# Patient Record
Sex: Male | Born: 1950
Health system: Southern US, Community
[De-identification: ages and names within clinical notes are randomized; demographics above are authoritative.]

## PROBLEM LIST (undated history)

## (undated) DIAGNOSIS — Z806 Family history of leukemia: Secondary | ICD-10-CM

## (undated) DIAGNOSIS — R2681 Unsteadiness on feet: Secondary | ICD-10-CM

## (undated) DIAGNOSIS — C61 Malignant neoplasm of prostate: Secondary | ICD-10-CM

## (undated) HISTORY — DX: Unsteadiness on feet: R26.81

## (undated) HISTORY — DX: Malignant neoplasm of prostate: C61

## (undated) HISTORY — DX: Family history of leukemia: Z80.6

---

## 2011-12-13 ENCOUNTER — Ambulatory Visit: Payer: Self-pay | Admitting: Internal Medicine

## 2012-07-17 DIAGNOSIS — C61 Malignant neoplasm of prostate: Secondary | ICD-10-CM

## 2012-07-17 HISTORY — DX: Malignant neoplasm of prostate: C61

## 2012-07-31 ENCOUNTER — Ambulatory Visit: Payer: Self-pay | Admitting: Urology

## 2012-08-05 ENCOUNTER — Ambulatory Visit: Payer: Self-pay | Admitting: Urology

## 2012-09-09 ENCOUNTER — Ambulatory Visit: Payer: Self-pay | Admitting: Radiation Oncology

## 2012-09-14 ENCOUNTER — Ambulatory Visit: Payer: Self-pay | Admitting: Radiation Oncology

## 2012-10-15 ENCOUNTER — Ambulatory Visit: Payer: Self-pay | Admitting: Radiation Oncology

## 2012-11-07 LAB — CBC CANCER CENTER
Basophil #: 0 x10 3/mm (ref 0.0–0.1)
Basophil %: 0.6 %
HCT: 43.4 % (ref 40.0–52.0)
Lymphocyte %: 36.4 %
Monocyte %: 13.1 %
Neutrophil %: 45.4 %
Platelet: 164 x10 3/mm (ref 150–440)
RDW: 13.8 % (ref 11.5–14.5)
WBC: 3.3 x10 3/mm — ABNORMAL LOW (ref 3.8–10.6)

## 2012-11-14 ENCOUNTER — Ambulatory Visit: Payer: Self-pay | Admitting: Radiation Oncology

## 2012-11-14 LAB — CBC CANCER CENTER
Eosinophil %: 6 %
Lymphocyte #: 1 x10 3/mm (ref 1.0–3.6)
Lymphocyte %: 29.2 %
MCHC: 32.9 g/dL (ref 32.0–36.0)
Monocyte #: 0.6 x10 3/mm (ref 0.2–1.0)
Monocyte %: 17 %
Neutrophil %: 47.2 %
RBC: 4.7 10*6/uL (ref 4.40–5.90)

## 2012-11-21 LAB — CBC CANCER CENTER
Basophil #: 0 "x10 3/mm "
Basophil %: 0.9 %
Eosinophil #: 0.2 "x10 3/mm "
Eosinophil %: 6.8 %
HCT: 40.1 %
HGB: 13.7 g/dL
Lymphocyte %: 23.5 %
Lymphs Abs: 0.7 "x10 3/mm " — ABNORMAL LOW
MCH: 31 pg
MCHC: 34.1 g/dL
MCV: 91 fL
Monocyte #: 0.5 "x10 3/mm "
Monocyte %: 14.9 %
Neutrophil #: 1.7 "x10 3/mm "
Neutrophil %: 53.9 %
Platelet: 137 "x10 3/mm " — ABNORMAL LOW
RBC: 4.41 "x10 6/mm "
RDW: 14.2 %
WBC: 3.1 "x10 3/mm " — ABNORMAL LOW

## 2012-11-28 LAB — CBC CANCER CENTER
Basophil #: 0 x10 3/mm (ref 0.0–0.1)
HCT: 39.8 % — ABNORMAL LOW (ref 40.0–52.0)
HGB: 13.5 g/dL (ref 13.0–18.0)
MCH: 31 pg (ref 26.0–34.0)
MCHC: 33.9 g/dL (ref 32.0–36.0)
MCV: 91 fL (ref 80–100)
Monocyte #: 0.5 x10 3/mm (ref 0.2–1.0)
Neutrophil #: 1.7 x10 3/mm (ref 1.4–6.5)
Platelet: 149 x10 3/mm — ABNORMAL LOW (ref 150–440)
RBC: 4.35 10*6/uL — ABNORMAL LOW (ref 4.40–5.90)
RDW: 14.2 % (ref 11.5–14.5)
WBC: 3.2 x10 3/mm — ABNORMAL LOW (ref 3.8–10.6)

## 2012-12-15 ENCOUNTER — Ambulatory Visit: Payer: Self-pay | Admitting: Radiation Oncology

## 2013-02-03 ENCOUNTER — Ambulatory Visit: Payer: Self-pay | Admitting: Radiation Oncology

## 2013-02-14 ENCOUNTER — Ambulatory Visit: Payer: Self-pay | Admitting: Radiation Oncology

## 2013-12-10 ENCOUNTER — Ambulatory Visit: Payer: Self-pay | Admitting: Urology

## 2014-10-07 ENCOUNTER — Ambulatory Visit
Admit: 2014-10-07 | Disposition: A | Discharge status: Home / Self Care | Payer: Self-pay | Attending: Oncology | Admitting: Oncology

## 2014-10-07 LAB — CREATININE, SERUM: Creatine, Serum: 1.05

## 2014-10-09 LAB — COMPREHENSIVE METABOLIC PANEL
ANION GAP: 7 (ref 7–16)
Albumin: 4.1 g/dL
Alkaline Phosphatase: 99 U/L
BILIRUBIN TOTAL: 0.5 mg/dL
BUN: 21 mg/dL — AB
CHLORIDE: 103 mmol/L
CO2: 28 mmol/L
CREATININE: 1.05 mg/dL
Calcium, Total: 9.3 mg/dL
GLUCOSE: 91 mg/dL
Potassium: 3.8 mmol/L
SGOT(AST): 22 U/L
SGPT (ALT): 14 U/L — ABNORMAL LOW
SODIUM: 138 mmol/L
Total Protein: 7.3 g/dL

## 2014-10-09 LAB — CBC CANCER CENTER
BASOS ABS: 0 x10 3/mm (ref 0.0–0.1)
Basophil %: 0.9 %
Eosinophil #: 0.2 x10 3/mm (ref 0.0–0.7)
Eosinophil %: 5.2 %
HCT: 35.3 % — ABNORMAL LOW (ref 40.0–52.0)
HGB: 12.4 g/dL — ABNORMAL LOW (ref 13.0–18.0)
LYMPHS ABS: 1.1 x10 3/mm (ref 1.0–3.6)
LYMPHS PCT: 25.3 %
MCH: 32.1 pg (ref 26.0–34.0)
MCHC: 35.1 g/dL (ref 32.0–36.0)
MCV: 91 fL (ref 80–100)
Monocyte #: 0.5 x10 3/mm (ref 0.2–1.0)
Monocyte %: 11.6 %
Neutrophil #: 2.6 x10 3/mm (ref 1.4–6.5)
Neutrophil %: 57 %
PLATELETS: 243 x10 3/mm (ref 150–440)
RBC: 3.86 10*6/uL — ABNORMAL LOW (ref 4.40–5.90)
RDW: 12.6 % (ref 11.5–14.5)
WBC: 4.5 x10 3/mm (ref 3.8–10.6)

## 2014-10-09 LAB — MAGNESIUM: Magnesium: 2.1 mg/dL

## 2014-10-16 ENCOUNTER — Ambulatory Visit
Admit: 2014-10-16 | Disposition: A | Discharge status: Home / Self Care | Payer: Self-pay | Attending: Oncology | Admitting: Oncology

## 2014-11-06 NOTE — Consult Note (Signed)
Reason for Visit: This 64 year old Male patient presents to the clinic for initial evaluation of  prostate cancer .   Referred by Dr. Maryan Puls.  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 Vira Agar was noted to have a nodular prostate. PSA was obtained and was in the 18 range. He was seen by Dr. Maryan Puls underwent transrectal ultrasound-guided biopsies showing prostatic adenocarcinoma with high score Gleason 7 (4+3). Tumor encompass most of the biopsy specimens. Patient does have some lower tract symptoms including urgency frequency and nocturia x3.He is having no hematuria. He is in no prior history of abdominalor pelvic surgery. I been asked to evaluate the patient and give opinion toward radiation therapy. Based on his Gleason score, abdominal pattern of Gleason 4 he has high risk of extracapsular extension and possible pelvic lymph node involvement.his CT scan of the abdomen pelvis shows prominent seminal vesicles. No clear indication of extracapsular extension although there are small periprosthetic lymph nodes in the pelvis.  Past, Family and Social History:  Past Medical History positive   Past Surgical History tonsillectomy   Family History noncontributory   Social History positive   Social History Comments no smoking history, social EtOH use history   Additional Past Medical and Surgical History seen by himself today   Allergies:   No Known Allergies:   Home Meds:  Home Medications: Medication Instructions Status  Rapaflo 4 mg oral capsule 1 cap(s) orally once a day Active   Review of Systems:  General negative   Performance Status (ECOG)  0   Skin negative   Breast negative   Ophthalmologic negative   ENMT negative   Respiratory and Thorax negative   Cardiovascular negative   Gastrointestinal negative   Musculoskeletal negative   Neurological negative   Psychiatric negative   Hematology/Lymphatics negative   Endocrine negative   Allergic/Immunologic negative   Review of Systems   according to nurse's notesPatient denies any weight loss, fatigue, weakness, fever, chills or night sweats. Patient denies any loss of vision, blurred vision. Patient denies any ringing  of the ears or hearing loss. No irregular heartbeat. Patient denies heart murmur or history of fainting. Patient denies any chest pain or pain radiating to her upper extremities. Patient denies any shortness of breath, difficulty breathing at night, cough or hemoptysis. Patient denies any swelling in the lower legs. Patient denies any nausea vomiting, vomiting of blood, or coffee ground material in the vomitus. Patient denies any stomach pain. Patient states has had normal bowel movements no significant constipation or diarrhea. Patient denies any dysuria, hematuria or significant nocturia. Patient denies any problems walking, swelling in the joints or loss of balance. Patient denies any skin changes, loss of hair or loss of weight. Patient denies any excessive worrying or anxiety or significant depression. Patient denies any problems with insomnia. Patient denies excessive thirst, polyuria, polydipsia. Patient denies any swollen glands, patient denies easy bruising or easy bleeding. Patient denies any recent infections, allergies or URI. Patient "s visual fields have not changed significantly in recent time.  Nursing Notes:  Nursing Vital Signs and Chemo Nursing Nursing Notes: *CC Vital Signs Flowsheet:  24-Feb-14 08:47  Temp Temperature 96.2  Pulse Pulse 64  Respirations Respirations 20  SBP SBP 117  DBP DBP 75  Pain Scale (0-10)  0  Current Weight  (kg) (kg) 77.1  Height (cm) centimeters 178.6  BSA (m2) 1.9   Physical Exam:  General/Skin/HEENT:  General normal   Skin normal   Eyes normal   ENMT normal   Head and Neck normal   Additional PE well-developed well-nourished male in NAD. Lungs are clear to A&P cardiac examination shows regular rate and rhythm. Abdomen is benign with no organomegaly or masses noted. On rectal exam rectal sphincter tone is good. Prostate is firm throughout slightly asymmetric with left lateral lobe little more firm than the right. No other rectal abnormalities identified.   Breasts/Resp/CV/GI/GU:  Respiratory and Thorax normal   Cardiovascular normal   Gastrointestinal normal   Genitourinary normal   MS/Neuro/Psych/Lymph:  Musculoskeletal normal   Neurological normal   Lymphatics normal   Other Results:  Radiology Results: LabUnknown:    15-Jan-14 12:01, CT Abdomen and Pelvis W/WO Contrast  PACS Image     20-Jan-14 13:12, Bone Scan Whole Body (Part 2 of 2)  PACS Image   CT:    15-Jan-14 12:01, CT Abdomen and Pelvis W/WO Contrast  CT Abdomen and Pelvis W/WO Contrast   REASON FOR EXAM:    LABS 1st Prostate CA  COMMENTS:       PROCEDURE: KCT - KCT ABDOMEN/PELVIS W/WO  - Jul 31 2012 12:01PM     RESULT: History: Prostate cancer    Comparison: No comparison    Technique: Multiple axial images of the abdomen and pelvis were performed   from the lung bases to the pubic symphysis, without p.o. contrast and   prior to and following 100 ml of Isovue-370 intravenous contrast in the   nephrographic and excretory renal phases.     Findings:  The lung bases are clear. Thereis no pneumothorax. The heart size is   normal.     There are no renal, ureteral, or bladder calculi. There is no obstructive   uropathy. There is no cystic or solid renal mass. Bilateral kidneys   enhance normally and symmetrically. There is normal symmetric bilateral   excretion of contrast without evidence of  filling defects. The bladder is   unremarkable. The prostate gland measures 4.8 x 4.1 cm.    The liver demonstrates no focal abnormality. There is no intrahepatic or   extrahepatic biliary ductal dilatation. The gallbladder is unremarkable.   The spleen demonstrates no focal abnormality. The  adrenal glands, and   pancreas are normal.    The unopacified stomach, duodenum, small intestine, and large intestine     demonstrate no gross abnormality, but evaluation is limited secondary to   lack of enteric contrast. There is diverticulosis without evidence of   diverticulitis. There is no pneumoperitoneum, pneumatosis, or portal   venous gas. There is no abdominal or pelvic free fluid.There is no   lymphadenopathy.     The abdominal aorta is normal in caliber.    The osseous structures are unremarkable.    IMPRESSION:     1. No acute abdominal or pelvic pathology.    2. Prostatic enlargement.  Dictation Site: 1        Verified IH:KVQQV Marlowe Sax, M.D., MD  Nuclear Med:    20-Jan-14 13:12, Bone Scan Whole Body (Part 2 of 2)  Bone Scan Whole Body (Part 2 of 2)   REASON FOR EXAM:  Prostate CA  COMMENTS:       PROCEDURE: KNM - KNM BONE WB 3HR 2 OF 2  - Aug 05 2012  1:12PM     RESULT: The patient received a dose of 23.62 mCi of technetium 27 M MDP.   Anterior and posterior whole body images Augmentin with oblique views of   the pelvis and thoracic region. There is a small amount of urine in the   bladder. There is some increased localization in the right maxillary   periodontal region and in the left nasal region. These could be secondary   to inflammation. Correlate clinically. Otherwise study is grossly   unremarkable. Some slightly asymmetric increased localization in the   right acromioclavicular joint with some mild increased uptake in the   sternomanubrial joint.  IMPRESSION:  No findings to suggest definite bony metastatic disease.   Likely inflammatory change in the  left nasal region and right maxillary.   Doppler region. Nonspecific, possibly degenerative changes in the sternum   and right sternoclavicular joint.    Dictation Site: 2        Verified By: Sundra Aland, M.D., MD   Relevent Results:   Relevant Scans and Labs CT scan and bone scan are both reviewed   Assessment and Plan: Impression:   clinical stage IIB adenocarcinoma the prostate in 64 year old male Plan:   at this time I got over treatment options with the patient including surgical intervention versus radiation therapy treatment options. I believe he is at high risk for extracapsular extension and possible pelvic lymph node involvement. I have recommended IMRT radiation therapy to his prostate as well as his pelvic lymph nodes. Would plan on delivering8200 cGy to his prostate and 5400 cGy to his pelvic nodes using IMRT dose painting technique. I've also would like him to be suppressed on Lupron for at least a year and a half and have given him and ordered a Lupron injection for 4 months today. Risks and benefits of Lupron therapy along with IMRT radiation therapy was discussed in detail. Exacerbation of urinary symptoms, possible diarrhea, possible fatigue, all were explained in detail to the patient. I have set him up for CT simulation in about a month's time like Lupron to work for about a month prior to commencing with radiation treatments. I've also asked in the meantime Dr. Yves Dill to place gold fiducial markers in the prostate for daily image guided radiation therapy. Appointments were made. Patient seems to comprehend my treatment plan well.  I would like to take this opportunity to thank you for allowing me to continue to participate in this patient's care.  CC Referral:  cc: Dr. Maryan Puls, Dr. Verta Ellen, Dr. Candice Camp   Electronic Signatures: Baruch Gouty Roda Shutters (MD)  (Signed 24-Feb-14 12:34)  Authored: HPI, Diagnosis, PFSH, Allergies, Home Meds, ROS, Nursing  Notes, Physical Exam, Other Results, Relevent Results, Encounter Assessment and Plan, CC Referring Physician   Last Updated: 24-Feb-14 12:34 by Armstead Peaks (MD)

## 2014-11-12 LAB — LACTATE DEHYDROGENASE: LDH: 205 U/L — ABNORMAL HIGH

## 2014-11-12 LAB — COMPREHENSIVE METABOLIC PANEL
ALT: 16 U/L — AB
Albumin: 4.4 g/dL
Alkaline Phosphatase: 103 U/L
Anion Gap: 8 (ref 7–16)
BUN: 24 mg/dL — AB
Bilirubin,Total: 0.4 mg/dL
CREATININE: 1.02 mg/dL
Calcium, Total: 9.2 mg/dL
Chloride: 105 mmol/L
Co2: 24 mmol/L
EGFR (Non-African Amer.): >60
GLUCOSE: 98 mg/dL
Potassium: 4.1 mmol/L
SGOT(AST): 23 U/L
Sodium: 137 mmol/L
TOTAL PROTEIN: 7.4 g/dL

## 2014-11-12 LAB — CBC CANCER CENTER
Basophil #: 0.1 x10 3/mm (ref 0.0–0.1)
Basophil %: 1.1 %
EOS ABS: 0.3 x10 3/mm (ref 0.0–0.7)
Eosinophil %: 5 %
HCT: 37.7 % — ABNORMAL LOW (ref 40.0–52.0)
HGB: 13 g/dL (ref 13.0–18.0)
LYMPHS PCT: 22.6 %
Lymphocyte #: 1.3 x10 3/mm (ref 1.0–3.6)
MCH: 31.4 pg (ref 26.0–34.0)
MCHC: 34.6 g/dL (ref 32.0–36.0)
MCV: 91 fL (ref 80–100)
MONO ABS: 0.5 x10 3/mm (ref 0.2–1.0)
MONOS PCT: 9 %
NEUTROS PCT: 62.3 %
Neutrophil #: 3.5 x10 3/mm (ref 1.4–6.5)
PLATELETS: 224 x10 3/mm (ref 150–440)
RBC: 4.16 10*6/uL — ABNORMAL LOW (ref 4.40–5.90)
RDW: 13.5 % (ref 11.5–14.5)
WBC: 5.6 x10 3/mm (ref 3.8–10.6)

## 2014-11-13 LAB — PSA: PSA: 59.6 ng/mL — ABNORMAL HIGH (ref 0.0–4.0)

## 2014-11-18 ENCOUNTER — Other Ambulatory Visit: Payer: Self-pay | Admitting: Oncology

## 2014-11-18 ENCOUNTER — Encounter: Payer: Self-pay | Admitting: *Deleted

## 2014-11-18 ENCOUNTER — Encounter: Payer: Self-pay | Admitting: Oncology

## 2014-11-18 DIAGNOSIS — C61 Malignant neoplasm of prostate: Secondary | ICD-10-CM

## 2014-11-19 ENCOUNTER — Ambulatory Visit: Payer: Self-pay

## 2014-11-19 ENCOUNTER — Encounter: Payer: Self-pay | Admitting: *Deleted

## 2014-11-19 ENCOUNTER — Telehealth: Payer: Self-pay | Admitting: *Deleted

## 2014-11-19 ENCOUNTER — Ambulatory Visit: Payer: Self-pay | Admitting: Oncology

## 2014-11-19 NOTE — Telephone Encounter (Addendum)
S/W Baxter Flattery at [facility] regarding patient assistance application. She reports that she is working on this, but had received incorrect insurance information for the patient. Insurance information updated and she will call back as soon as she has an application update.  11/23/14 - T/C made back to [employer] regarding patient eligibility for abiraterone drug assistance program. Was informed that his insurance would need to be verified, and this process usually takes 2-3 days. States they will notify us as soon as they can determine whether the patient is eligible.  11/24/14 $[id number]$ :00am  T/C back to [employer] to inquire about abiraterone. Was informed by Jonelle Sidle at [employer] the request for insurance verification was sent as an expedited request, but they have no way of knowing when the insurance will be verified. States she will request that it be moved to the top of the list; but also states the patient will not be approved until he has met his $2000.00 insurance drug benefit cap. T/C then made to [facility], who verified that the patient had only used $200.00 of his benefit. She ran the claim and determined that the patient would have an out-of-pocket balance of $4420.00. Informed her that the patient cannot afford this, and she states he is eligible for a one time co-pay card, which would bring his co-pay balance to just $10.00. Informed her that this is acceptable, and states she will process the medication prescription now and call the patient to schedule a delivery time for tomorrow. T/C made to patient to verify that this is acceptable, and he agrees that it is. Informed Dr. Oliva Bustard of all of above, and he states it is OK to postpone the patient's Zometa until tomorrow so he will not have to make an extra trip to begin his study drug and will only have labs drawn once. Instructed Mr. Wiederholt that we will cancel his appt originally scheduled for today, and reschedule his appointment for the same  time tomorrow to begin his new chemotherapy regimen and receive his Zometa infusion. Patient verbalizes agreement with this plan.

## 2014-11-19 NOTE — Progress Notes (Signed)
William Wright was randomized this afternoon to Arm B of the [facility] [phone] research protocol and will receive oral Enzalutamide(Xtandi) + Abirateraone(Zytiga) + Prednisone. Dr. Oliva Bustard notified of randomization arm and the study drug Enzalutamide was ordered through [facility]. Application for patient medication assistance was sent to [employer] last week. T/C made to follow up on this application. Spoke with Baxter Flattery, who stated she had received incorrect insurance information for the patient. Insurance information corrected and she is to follow up and let me know whether the patient qualifies for this assistance. T/C made to the patient to inform of the treatment arm he is now on.

## 2014-11-23 ENCOUNTER — Other Ambulatory Visit: Payer: Self-pay | Admitting: *Deleted

## 2014-11-23 DIAGNOSIS — C61 Malignant neoplasm of prostate: Secondary | ICD-10-CM

## 2014-11-23 MED ORDER — PREDNISONE 5 MG PO TABS: 5.0000 mg | ORAL_TABLET | Freq: Two times a day (BID) | ORAL | Status: DC

## 2014-11-24 ENCOUNTER — Other Ambulatory Visit: Payer: Self-pay | Admitting: *Deleted

## 2014-11-24 ENCOUNTER — Other Ambulatory Visit: Payer: BLUE CROSS/BLUE SHIELD

## 2014-11-24 ENCOUNTER — Ambulatory Visit: Payer: BLUE CROSS/BLUE SHIELD

## 2014-11-24 DIAGNOSIS — C61 Malignant neoplasm of prostate: Secondary | ICD-10-CM

## 2014-11-25 ENCOUNTER — Inpatient Hospital Stay: Payer: BLUE CROSS/BLUE SHIELD

## 2014-11-25 ENCOUNTER — Encounter (INDEPENDENT_AMBULATORY_CARE_PROVIDER_SITE_OTHER): Payer: Self-pay

## 2014-11-25 ENCOUNTER — Inpatient Hospital Stay: Payer: BLUE CROSS/BLUE SHIELD | Attending: Oncology

## 2014-11-25 ENCOUNTER — Other Ambulatory Visit: Payer: Self-pay | Admitting: Oncology

## 2014-11-25 ENCOUNTER — Encounter: Payer: Self-pay | Admitting: Oncology

## 2014-11-25 VITALS — BP 128/76 | HR 62 | Temp 97.3°F | Resp 18

## 2014-11-25 DIAGNOSIS — C61 Malignant neoplasm of prostate: Secondary | ICD-10-CM | POA: Insufficient documentation

## 2014-11-25 DIAGNOSIS — Z79899 Other long term (current) drug therapy: Secondary | ICD-10-CM | POA: Diagnosis not present

## 2014-11-25 DIAGNOSIS — C7951 Secondary malignant neoplasm of bone: Secondary | ICD-10-CM | POA: Diagnosis not present

## 2014-11-25 LAB — BASIC METABOLIC PANEL
ANION GAP: 5 (ref 5–15)
BUN: 24 mg/dL — ABNORMAL HIGH (ref 6–20)
CO2: 28 mmol/L (ref 22–32)
Calcium: 9.1 mg/dL (ref 8.9–10.3)
Chloride: 105 mmol/L (ref 101–111)
Creatinine, Ser: 1.04 mg/dL (ref 0.61–1.24)
GFR calc Af Amer: >60 mL/min (ref >60)
GFR calc non Af Amer: >60 mL/min (ref >60)
Glucose, Bld: 100 mg/dL — ABNORMAL HIGH (ref 65–99)
Potassium: 4.1 mmol/L (ref 3.5–5.1)
SODIUM: 138 mmol/L (ref 135–145)

## 2014-11-25 MED ORDER — INV-ENZALUTAMIDE 40 MG CAPS #120 ALLIANCE A031201: 160.0000 mg | ORAL_CAPSULE | Freq: Every day | ORAL | Status: DC

## 2014-11-25 MED ORDER — ZOLEDRONIC ACID 4 MG/100ML IV SOLN
4.0000 mg | Freq: Once | INTRAVENOUS | Status: AC
  Administered 2014-11-25: 4 mg via INTRAVENOUS
  Filled 2014-11-25: qty 100

## 2014-11-25 MED ORDER — SODIUM CHLORIDE 0.9 % IV SOLN
Freq: Once | INTRAVENOUS | Status: AC
  Administered 2014-11-25: 12:00:00 via INTRAVENOUS
  Filled 2014-11-25: qty 250

## 2014-11-25 NOTE — Progress Notes (Unsigned)
11/25/14 @10 :55am  William Wright returns to clinic as instructed this morning to begin his new treatment as prescribed by [facility] [phone] [employer] study, which includes Enzaludamide 160mg (4 capsules) every am with or without food; Abiraterone 1000mg (4 tablets) every pm on an empty stomach; and Prednisone 5mg  twice daily preferably with food. Reviewed all medication instructions with patient including what time and how to take each medication, how many of each tablet/capsule to take, how to document when he has taken the meds on each individual log, what to do if he forgets to take a dose, and not to repeat any of the medication if he vomits after he takes a scheduled dose. William Wright questions when he should take the Abiraterone because he was instructed by the [facility] Pharmacist to take this medication in the morning. Instructed him that per the protocol schedule, he will take the Enzaludamide each morning and the Abiraterone in the evening. Reviewed med diary, which also shows the name of each medication and whether they should be taken in am or pm. Instructions for each medication provided to patient along with med diary/calendar for each drug. The patient has picked up his prednisone prescription from the pharmacy and [facility] delivered the Abiraterone to his home today. He did not bring these medications with him this morning, but states he has not taken any of the new medications yet. Inst to take the Enzalutamide and prednisone as soon as he gets home today, and then take Prednisone again at dinner time and the Abiraterone 2 hours after eating dinner this evening. William Wright voiced understanding of these instructions, but I will call him at home to make sure he takes the medication correctly.

## 2014-11-30 ENCOUNTER — Telehealth: Payer: Self-pay | Admitting: *Deleted

## 2014-11-30 NOTE — Progress Notes (Unsigned)
PSN met with patient today to discuss financial concerns.  Patient referred to a patient financial services representative to discuss the completion of a financial hardship document.

## 2014-11-30 NOTE — Telephone Encounter (Signed)
11/23/14 Spoke with patient earlier this afternoon to get a list of all of the dietary supplements the patient is taking. Mr. Murch listed multiple supplements and vitamins that were not on his original medication list including Beta-1 Glucon 3-D, Spirolina, Barley power Green Supreme, Selenium, Turmeric, Zinc, Green Tea extract Heart Plus supplement, Multi-nutrient daily supplement, Eleuthera root powder, and vitamins A, E, C & D. Patient states these supplements are all recommended for Cancer Prevention. Reviewed the list with Dr. Oliva Bustard, who states the patient should immediately stop taking all of the supplements and only take a multi-vitamin each day. States to inform Mr. Sasso that we have no research to determine whether any of these supplements are safe; therefore he should stop taking all of them while he is taking chemotherapy. T/C made back to Mr. Dulay at 4:47pm to instruct him regarding the above information from Dr. Oliva Bustard. Patient states he will stop taking all of the medications today and take only what Dr. Oliva Bustard prescribes for him. Reminded patient that Dr. Oliva Bustard has e-scribed a prescription for Prednisone that he will need to pick up to take when the oral chemotherapy begins, and Mr. Goza states he will pick it up today or tomorrow.

## 2014-12-02 ENCOUNTER — Telehealth: Payer: Self-pay | Admitting: *Deleted

## 2014-12-02 NOTE — Telephone Encounter (Signed)
12/02/14 @09 :13 T/C made to Polly [facility] to follow up his first week of oral chemotherapy. Patient reports he is doing well, and the only side effect he has noticed is that his hot flashes have increased. He denies that this has interfered with his ADLs or ability to sleep. States he did miss his Prednisone dose scheduled for last PM, and that he didn't realize it until this morning. Instructed him not to make up the dose, but just mark it as a missed dose on his calendar. Informed Mr. Full that I received a letter from [employer] denying our request for assistance with the Abiraterone because he has a prescription benefit on his insurance, but that I have called them back and asked them to run the claim again since he has now max'd out that $2000.00 benefit. Patient states he received a letter as well and had planned to call me about it today. Informed patient that if we receive a second denial, we will discuss our options with Dr. Oliva Bustard and [facility]. He will have Medicare benefits beginning in July, and there should not be an issue with obtaining the drug at that point.

## 2014-12-09 ENCOUNTER — Telehealth: Payer: Self-pay | Admitting: *Deleted

## 2014-12-09 ENCOUNTER — Inpatient Hospital Stay: Payer: BLUE CROSS/BLUE SHIELD

## 2014-12-09 DIAGNOSIS — C61 Malignant neoplasm of prostate: Secondary | ICD-10-CM

## 2014-12-09 LAB — HEPATIC FUNCTION PANEL
ALBUMIN: 4.3 g/dL (ref 3.5–5.0)
ALK PHOS: 123 U/L (ref 38–126)
ALT: 16 U/L — ABNORMAL LOW (ref 17–63)
AST: 16 U/L (ref 15–41)
Bilirubin, Direct: <0.1 mg/dL — ABNORMAL LOW (ref 0.1–0.5)
Total Bilirubin: 0.7 mg/dL (ref 0.3–1.2)
Total Protein: 6.9 g/dL (ref 6.5–8.1)

## 2014-12-09 NOTE — Telephone Encounter (Signed)
12/09/14 @13 :54 T/C made to William Wright to provide lab results from his liver profile drawn this morning. Per Dr. Oliva Bustard, the results are either within normal limits, or unchanged since baseline. Instructed per Dr. Oliva Bustard to continue to take the medication as previously prescribed. William Wright denies experiencing any adverse effects of the chemotherapy and questions when we will repeat scans and PSA to see if the new medication regimen is working. Instructed patient  that we will repeat PSA, CT and bone scans after he has been on treatment for 8 weeks, but that Dr. Oliva Bustard may get another PSA level in 2 weeks. Patient also has questions about the DNA testing that will be performed centrally and what they are testing for. Informed that we will not get the results of those tests, and that they are testing his blood to determine if he has a gene that may have caused his prostate cancer. Encouraged William Wright to call back anytime if he has questions or begins experiencing side effects from these chemo meds. KSS

## 2014-12-23 ENCOUNTER — Inpatient Hospital Stay (HOSPITAL_BASED_OUTPATIENT_CLINIC_OR_DEPARTMENT_OTHER): Payer: BLUE CROSS/BLUE SHIELD | Admitting: Family Medicine

## 2014-12-23 ENCOUNTER — Inpatient Hospital Stay: Payer: BLUE CROSS/BLUE SHIELD | Attending: Oncology

## 2014-12-23 ENCOUNTER — Encounter: Payer: Self-pay | Admitting: Internal Medicine

## 2014-12-23 ENCOUNTER — Inpatient Hospital Stay: Payer: BLUE CROSS/BLUE SHIELD

## 2014-12-23 ENCOUNTER — Encounter: Payer: Self-pay | Admitting: *Deleted

## 2014-12-23 VITALS — BP 129/54 | HR 78 | Temp 97.5°F | Resp 18 | Wt 169.3 lb

## 2014-12-23 DIAGNOSIS — C61 Malignant neoplasm of prostate: Secondary | ICD-10-CM

## 2014-12-23 DIAGNOSIS — R5383 Other fatigue: Secondary | ICD-10-CM | POA: Insufficient documentation

## 2014-12-23 DIAGNOSIS — C7951 Secondary malignant neoplasm of bone: Secondary | ICD-10-CM

## 2014-12-23 DIAGNOSIS — Z79818 Long term (current) use of other agents affecting estrogen receptors and estrogen levels: Secondary | ICD-10-CM | POA: Insufficient documentation

## 2014-12-23 DIAGNOSIS — R5381 Other malaise: Secondary | ICD-10-CM | POA: Insufficient documentation

## 2014-12-23 DIAGNOSIS — M545 Low back pain: Secondary | ICD-10-CM | POA: Insufficient documentation

## 2014-12-23 DIAGNOSIS — Z7952 Long term (current) use of systemic steroids: Secondary | ICD-10-CM | POA: Diagnosis not present

## 2014-12-23 DIAGNOSIS — Z79899 Other long term (current) drug therapy: Secondary | ICD-10-CM | POA: Insufficient documentation

## 2014-12-23 LAB — COMPREHENSIVE METABOLIC PANEL
ALT: 22 U/L (ref 17–63)
AST: 15 U/L (ref 15–41)
Albumin: 4.5 g/dL (ref 3.5–5.0)
Alkaline Phosphatase: 115 U/L (ref 38–126)
Anion gap: 6 (ref 5–15)
BUN: 30 mg/dL — ABNORMAL HIGH (ref 6–20)
CHLORIDE: 105 mmol/L (ref 101–111)
CO2: 28 mmol/L (ref 22–32)
Calcium: 9.5 mg/dL (ref 8.9–10.3)
Creatinine, Ser: 0.98 mg/dL (ref 0.61–1.24)
GFR calc Af Amer: >60 mL/min (ref >60)
GFR calc non Af Amer: >60 mL/min (ref >60)
Glucose, Bld: 129 mg/dL — ABNORMAL HIGH (ref 65–99)
Potassium: 4.2 mmol/L (ref 3.5–5.1)
Sodium: 139 mmol/L (ref 135–145)
Total Bilirubin: 0.5 mg/dL (ref 0.3–1.2)
Total Protein: 7.2 g/dL (ref 6.5–8.1)

## 2014-12-23 LAB — CBC WITH DIFFERENTIAL/PLATELET
BASOS ABS: 0 10*3/uL (ref 0–0.1)
BASOS PCT: 1 %
EOS ABS: 0.1 10*3/uL (ref 0–0.7)
EOS PCT: 2 %
HEMATOCRIT: 41 % (ref 40.0–52.0)
HEMOGLOBIN: 13.7 g/dL (ref 13.0–18.0)
LYMPHS PCT: 23 %
Lymphs Abs: 1 10*3/uL (ref 1.0–3.6)
MCH: 31.3 pg (ref 26.0–34.0)
MCHC: 33.5 g/dL (ref 32.0–36.0)
MCV: 93.4 fL (ref 80.0–100.0)
Monocytes Absolute: 0.3 10*3/uL (ref 0.2–1.0)
Monocytes Relative: 8 %
NEUTROS ABS: 2.8 10*3/uL (ref 1.4–6.5)
NEUTROS PCT: 66 %
PLATELETS: 193 10*3/uL (ref 150–440)
RBC: 4.39 MIL/uL — ABNORMAL LOW (ref 4.40–5.90)
RDW: 14.5 % (ref 11.5–14.5)
WBC: 4.2 10*3/uL (ref 3.8–10.6)

## 2014-12-23 LAB — PSA: PSA: 22.91 ng/mL — AB (ref 0.00–4.00)

## 2014-12-23 MED ORDER — INV-ENZALUTAMIDE 40 MG CAPS #120 ALLIANCE A031201: 160.0000 mg | ORAL_CAPSULE | Freq: Every day | ORAL | Status: DC

## 2014-12-23 MED ORDER — PREDNISONE 5 MG PO TABS: 5.0000 mg | ORAL_TABLET | Freq: Two times a day (BID) | ORAL | Status: DC

## 2014-12-23 MED ORDER — ABIRATERONE ACETATE 250 MG PO TABS: 1000.0000 mg | ORAL_TABLET | Freq: Every day | ORAL | Status: DC

## 2014-12-23 MED ORDER — SODIUM CHLORIDE 0.9 % IV SOLN
Freq: Once | INTRAVENOUS | Status: AC
  Administered 2014-12-23: 15:00:00 via INTRAVENOUS
  Filled 2014-12-23: qty 250

## 2014-12-23 MED ORDER — ZOLEDRONIC ACID 4 MG/5ML IV CONC: 4.0000 mg | Freq: Once | INTRAVENOUS | Status: DC

## 2014-12-23 MED ORDER — ZOLEDRONIC ACID 4 MG/100ML IV SOLN
4.0000 mg | Freq: Once | INTRAVENOUS | Status: AC
Start: 2014-12-23 — End: 2014-12-23
  Administered 2014-12-23: 4 mg via INTRAVENOUS
  Filled 2014-12-23: qty 100

## 2014-12-23 NOTE — Progress Notes (Signed)
12/23/14 @1533  pm Mr. Raynor Calcaterra returns to clinic this afternoon for consideration of cycle 2 treatment with enzalutamide, abiraterone and prednisone. Patient reports he has been doing well and denies experiencing any problems taking the medications. He has returned his completed medication diaries along with all of his pill bottles. Per patient med diaries, he took enzalutamide 4 caps each morning as prescribed, abiraterone was documented as being taken, however there was an extra dose on pill count. He reports having missed one dose of Prednisone; however the pill count is correct for twice daily dosing. PK questionnaire was reviewed with patient and completed during office visit as he had not fully understood how to complete the form and some time points were missing. Solicited adverse events reviewed and positive responses related to study drug include fatigue - grade 1; definitely related and hyperglycemia, non-fasting - grade 1; probably related to prednisone. Unrelated events include Bone pain - grade 1; unlikely related to study drug since patient states he experienced it previously, and hot flashes - grade 1; which patient has experienced since beginning Trelstar. Mr. Reaume was seen by Georgeanne Nim, NP for H&P. VS and weight are stable. CBC and chemistry values are all within acceptable parameters to proceed with Cycle 2 chemotherapy. Patient states he received a call from [facility] yesterday and they will deliver his abiraterone (Zytiga) today. Patient states he will pick up a refill of the Prednisone this afternoon from his local pharmacy. Study drug Enzalutamide 40mg  caps, #120 bottle provided to patient per [place] after chemo orders were signed. New medication diaries for the next cycle were provided to patient along with instructions on how/when to take each drug. Instructed to call if there are any issues obtaining his medication or if he develops a new or more serious  side effects from any of the medication. Patient also received his Zometa infusion this afternoon, and was given appointments to return to clinic in 2 weeks for lab only, and in 4 weeks for his next chemotherapy cycle.  12/24/14 PSA resulted from yesterday and was 22.91, which is significantly down from 59.6 at baseline.

## 2014-12-23 NOTE — Progress Notes (Signed)
William Wright  [phone]  Fax:([phone]) [place] DOB: [birth year]  MR#: [id number]  BWL#:[id number]  Patient Care Team: No Pcp Per Patient as PCP - General (General Practice)  CHIEF COMPLAINT:  Chief Complaint  Patient presents with  . Follow-up    states is feeling well. has occasional lower back pain that radiates down left leg. current pain medication helps relieve pain.    INTERVAL HISTORY: Patient is here today for further evaluation and consideration of cycle 2 treatment with enzalutamide, abiraterone and prednisone. He reports feeling very well and denies experiencing any problems. He does have some mild fatigue, some intermittent bone pain. PSA reported on 12/24/2014 as 22.91, which is significantly down from 59.6 at baseline.    REVIEW OF SYSTEMS:   Review of Systems  Constitutional: Positive for malaise/fatigue.  HENT: Negative.   Eyes: Negative.   Respiratory: Negative.   Cardiovascular: Negative.   Gastrointestinal: Negative.   Genitourinary: Negative.   Musculoskeletal: Positive for joint pain.  Skin: Negative.   Psychiatric/Behavioral: Negative.     As per HPI. Otherwise, a complete review of systems is negatve instrument into the.    ONCOLOGY HISTORY:  No history exists.    PAST MEDICAL HISTORY: Past Medical History  Diagnosis Date  . Prostate cancer   . Cancer of prostate 11/18/2014    PAST SURGICAL HISTORY: No past surgical history on file.  FAMILY HISTORY No family history on file.  GYNECOLOGIC HISTORY:  No LMP for male patient.     ADVANCED DIRECTIVES:    HEALTH MAINTENANCE: History  Substance Use Topics  . Smoking status: Never Smoker   . Smokeless tobacco: Not on file  . Alcohol Use: No     Colonoscopy:  PAP:  Bone density:  Lipid panel:  Allergies  Allergen Reactions  . No Known Allergies     Current Outpatient Prescriptions  Medication Sig Dispense Refill  .  HYDROcodone-acetaminophen (NORCO/VICODIN) 5-325 MG per tablet Take 1 tablet by mouth every 6 (six) hours as needed for moderate pain.    . Investigational enzalutamide 40 MG capsule [facility] [id number] Take 4 capsules (160 mg total) by mouth daily. Take with or without food.Swallow whole. Do not crush, or open capsules. 120 capsule 0  . predniSONE (DELTASONE) 5 MG tablet Take 1 tablet (5 mg total) by mouth 2 (two) times daily with a meal. 60 tablet 6  . Triptorelin Pamoate (TRELSTAR) 22.5 MG injection Inject 22.5 mg into the muscle every 6 (six) months.     No current facility-administered medications for this visit.    OBJECTIVE: BP 129/54 mmHg  Pulse 78  Temp(Src) 97.5 F (36.4 C) (Tympanic)  Resp 18  Wt 169 lb 5 oz (76.8 kg)   Body mass index is 24.24 kg/(m^2).    ECOG FS:0 - Asymptomatic  General: Well-developed, well-nourished, no acute distress. Eyes: Pink conjunctiva, anicteric sclera. HEENT: Normocephalic, moist mucous membranes, clear oropharnyx. Lungs: Clear to auscultation bilaterally. Heart: Regular rate and rhythm. No rubs, murmurs, or gallops. Abdomen: Soft, nontender, nondistended. No organomegaly noted, normoactive bowel sounds. Musculoskeletal: No edema, cyanosis, or clubbing. Neuro: Alert, answering all questions appropriately. Cranial nerves grossly intact. Skin: No rashes or petechiae noted. Psych: Normal affect.    LAB RESULTS:     Component Value Date/Time   NA 139 12/23/2014 1401   NA 137 11/12/2014 1415   K 4.2 12/23/2014 1401   K 4.1 11/12/2014 1415   CL 105 12/23/2014 1401  CL 105 11/12/2014 1415   CO2 28 12/23/2014 1401   CO2 24 11/12/2014 1415   GLUCOSE 129* 12/23/2014 1401   GLUCOSE 98 11/12/2014 1415   BUN 30* 12/23/2014 1401   BUN 24* 11/12/2014 1415   CREATININE 0.98 12/23/2014 1401   CREATININE 1.02 11/12/2014 1415   CALCIUM 9.5 12/23/2014 1401   CALCIUM 9.2 11/12/2014 1415   PROT 7.2 12/23/2014 1401   PROT 7.4 11/12/2014 1415    ALBUMIN 4.5 12/23/2014 1401   ALBUMIN 4.4 11/12/2014 1415   AST 15 12/23/2014 1401   AST 23 11/12/2014 1415   ALT 22 12/23/2014 1401   ALT 16* 11/12/2014 1415   ALKPHOS 115 12/23/2014 1401   ALKPHOS 103 11/12/2014 1415   BILITOT 0.5 12/23/2014 1401   GFRNONAA >60 12/23/2014 1401   GFRNONAA >60 11/12/2014 1415   GFRAA >60 12/23/2014 1401   GFRAA >60 11/12/2014 1415    No results found for: SPEP, UPEP  Lab Results  Component Value Date   WBC 4.2 12/23/2014   NEUTROABS 2.8 12/23/2014   HGB 13.7 12/23/2014   HCT 41.0 12/23/2014   MCV 93.4 12/23/2014   PLT 193 12/23/2014      Chemistry      Component Value Date/Time   NA 139 12/23/2014 1401   NA 137 11/12/2014 1415   K 4.2 12/23/2014 1401   K 4.1 11/12/2014 1415   CL 105 12/23/2014 1401   CL 105 11/12/2014 1415   CO2 28 12/23/2014 1401   CO2 24 11/12/2014 1415   BUN 30* 12/23/2014 1401   BUN 24* 11/12/2014 1415   CREATININE 0.98 12/23/2014 1401   CREATININE 1.02 11/12/2014 1415      Component Value Date/Time   CALCIUM 9.5 12/23/2014 1401   CALCIUM 9.2 11/12/2014 1415   ALKPHOS 115 12/23/2014 1401   ALKPHOS 103 11/12/2014 1415   AST 15 12/23/2014 1401   AST 23 11/12/2014 1415   ALT 22 12/23/2014 1401   ALT 16* 11/12/2014 1415   BILITOT 0.5 12/23/2014 1401       No results found for: LABCA2  No components found for: LABCA125  No results for input(s): INR in the last 168 hours.  No results found for: COLORURINE, APPEARANCEUR, LABSPEC, PHURINE, GLUCOSEU, HGBUR, BILIRUBINUR, KETONESUR, PROTEINUR, UROBILINOGEN, NITRITE, LEUKOCYTESUR  STUDIES: No results found.  ASSESSMENT:  Stage IV prostate CA.  PLAN:   1. Prostate CA. Patient is currently under trial with enzalutamide, abiraterone and prednisone . He is also for Zometa today. He has been evaluated by research nurse Sharman Cheek RN. Labs are appropriate to continue with treatment. We'll continue with follow-up as per protocol.  Patient expressed  understanding and was in agreement with this plan. He also understands that He can call clinic at any time with any questions, concerns, or complaints.   Dr. Grayland Ormond was available for consultation and review of plan of care for this patient.  Evlyn Kanner, NP   12/23/2014 2:52 PM

## 2014-12-25 LAB — MISC LABCORP TEST (SEND OUT)

## 2015-01-05 ENCOUNTER — Other Ambulatory Visit: Payer: Self-pay | Admitting: *Deleted

## 2015-01-05 DIAGNOSIS — C61 Malignant neoplasm of prostate: Secondary | ICD-10-CM

## 2015-01-06 ENCOUNTER — Inpatient Hospital Stay: Payer: BLUE CROSS/BLUE SHIELD

## 2015-01-06 ENCOUNTER — Telehealth: Payer: Self-pay | Admitting: *Deleted

## 2015-01-06 DIAGNOSIS — C61 Malignant neoplasm of prostate: Secondary | ICD-10-CM | POA: Diagnosis not present

## 2015-01-06 LAB — HEPATIC FUNCTION PANEL
ALBUMIN: 4.2 g/dL (ref 3.5–5.0)
ALT: 16 U/L — AB (ref 17–63)
AST: 14 U/L — ABNORMAL LOW (ref 15–41)
Alkaline Phosphatase: 90 U/L (ref 38–126)
Bilirubin, Direct: <0.1 mg/dL — ABNORMAL LOW (ref 0.1–0.5)
TOTAL PROTEIN: 7.1 g/dL (ref 6.5–8.1)
Total Bilirubin: 0.6 mg/dL (ref 0.3–1.2)

## 2015-01-06 NOTE — Telephone Encounter (Signed)
T/C made to [name] to inform of lab results. Patient labs were wnl. Instructed him to call if he has any questions. Patient was given appointments for his Bone scan on 01/19/15 at 9:30 and 12:30 at the [address] location, and CT scan on 01/21/15 at 12:45 [facility], and was reminded to pick up his prep kit at least the day prior to his CT scan. Also reminded patient of his next follow up appointment with Dr. Choksi on 01/20/15, and informed him that Lee Mallatratt, RN will be managing his care that day. 

## 2015-01-07 ENCOUNTER — Telehealth: Payer: Self-pay | Admitting: *Deleted

## 2015-01-07 NOTE — Telephone Encounter (Signed)
Received t/c from [name] earlier stating he would need to reschedule his CT scan on Thursday - 01/21/15 because he is now going to have a Lithotripsy procedure on that same day. CT rescheduled for 01/15/15 at 10:00am at the [facility], and he should arrive at 9:45. T/C made to instruct [name] on this change, and that he will need to pick up a prep kit prior to 01/15/15. Also instructed patient that he should not eat any solid foods 4 hours prior to the CT scan - only clear liquids. [name] correctly repeats back instructions while on the phone and states he will pick up the kit next week prior to the scan.

## 2015-01-15 ENCOUNTER — Ambulatory Visit
Admission: RE | Admit: 2015-01-15 | Discharge: 2015-01-15 | Disposition: A | Discharge status: Home / Self Care | Payer: Medicare Other | Source: Ambulatory Visit | Attending: Oncology | Admitting: Oncology

## 2015-01-15 DIAGNOSIS — R590 Localized enlarged lymph nodes: Secondary | ICD-10-CM | POA: Diagnosis not present

## 2015-01-15 DIAGNOSIS — I712 Thoracic aortic aneurysm, without rupture: Secondary | ICD-10-CM | POA: Diagnosis not present

## 2015-01-15 DIAGNOSIS — Z08 Encounter for follow-up examination after completed treatment for malignant neoplasm: Secondary | ICD-10-CM | POA: Diagnosis not present

## 2015-01-15 DIAGNOSIS — C7951 Secondary malignant neoplasm of bone: Secondary | ICD-10-CM | POA: Diagnosis not present

## 2015-01-15 DIAGNOSIS — C61 Malignant neoplasm of prostate: Secondary | ICD-10-CM | POA: Diagnosis not present

## 2015-01-15 MED ORDER — IOHEXOL 300 MG/ML  SOLN
75.0000 mL | Freq: Once | INTRAMUSCULAR | Status: AC | PRN
  Administered 2015-01-15: 75 mL via INTRAVENOUS

## 2015-01-19 ENCOUNTER — Telehealth: Payer: Self-pay

## 2015-01-19 ENCOUNTER — Encounter
Admission: RE | Admit: 2015-01-19 | Discharge: 2015-01-19 | Disposition: A | Discharge status: Home / Self Care | Payer: Medicare Other | Source: Ambulatory Visit | Attending: Oncology | Admitting: Oncology

## 2015-01-19 DIAGNOSIS — C61 Malignant neoplasm of prostate: Secondary | ICD-10-CM | POA: Diagnosis not present

## 2015-01-19 DIAGNOSIS — C7951 Secondary malignant neoplasm of bone: Secondary | ICD-10-CM | POA: Insufficient documentation

## 2015-01-19 MED ORDER — TECHNETIUM TC 99M MEDRONATE IV KIT
25.0000 | PACK | Freq: Once | INTRAVENOUS | Status: AC | PRN
  Administered 2015-01-19: 22.61 via INTRAVENOUS

## 2015-01-19 NOTE — Telephone Encounter (Signed)
Patient contacted research office this morning to inquire about his CT results from CT performed on 01/15/2015.  He was specifically asking about any results related to kidney stones as he states Dr. Yves Dill (urologist) had mentioned blasting his kidney stones if they were enlarged on CT scan. I spoke to Dr. [place] nurse Angie Fava and she reviewed scan and there is mention of bilateral renal calculi but no obstruction.  I called patient back and relayed this information and faxed a copy of CT results from 01/15/2015 and 11/12/2014 to Dr. Yves Dill for his review with note requesting his office call patient if they want to do anything further for kidney stones.

## 2015-01-20 ENCOUNTER — Inpatient Hospital Stay (HOSPITAL_BASED_OUTPATIENT_CLINIC_OR_DEPARTMENT_OTHER): Payer: Medicare Other | Admitting: Oncology

## 2015-01-20 ENCOUNTER — Encounter: Payer: Self-pay | Admitting: *Deleted

## 2015-01-20 ENCOUNTER — Inpatient Hospital Stay: Payer: Medicare Other

## 2015-01-20 ENCOUNTER — Inpatient Hospital Stay: Payer: Medicare Other | Attending: Family Medicine

## 2015-01-20 VITALS — BP 136/74 | HR 73 | Temp 97.2°F | Wt 171.5 lb

## 2015-01-20 DIAGNOSIS — R972 Elevated prostate specific antigen [PSA]: Secondary | ICD-10-CM | POA: Insufficient documentation

## 2015-01-20 DIAGNOSIS — Z79818 Long term (current) use of other agents affecting estrogen receptors and estrogen levels: Secondary | ICD-10-CM | POA: Insufficient documentation

## 2015-01-20 DIAGNOSIS — C61 Malignant neoplasm of prostate: Secondary | ICD-10-CM

## 2015-01-20 DIAGNOSIS — Z79899 Other long term (current) drug therapy: Secondary | ICD-10-CM | POA: Insufficient documentation

## 2015-01-20 DIAGNOSIS — Z7952 Long term (current) use of systemic steroids: Secondary | ICD-10-CM | POA: Insufficient documentation

## 2015-01-20 DIAGNOSIS — C7951 Secondary malignant neoplasm of bone: Secondary | ICD-10-CM | POA: Diagnosis not present

## 2015-01-20 LAB — BASIC METABOLIC PANEL
Anion gap: 9 (ref 5–15)
BUN: 24 mg/dL — ABNORMAL HIGH (ref 6–20)
CO2: 27 mmol/L (ref 22–32)
Calcium: 9.2 mg/dL (ref 8.9–10.3)
Chloride: 104 mmol/L (ref 101–111)
Creatinine, Ser: 1.07 mg/dL (ref 0.61–1.24)
GFR calc Af Amer: >60 mL/min (ref >60)
GLUCOSE: 106 mg/dL — AB (ref 65–99)
POTASSIUM: 4 mmol/L (ref 3.5–5.1)
Sodium: 140 mmol/L (ref 135–145)

## 2015-01-20 LAB — CBC WITH DIFFERENTIAL/PLATELET
Basophils Absolute: 0 10*3/uL (ref 0–0.1)
Basophils Relative: 1 %
EOS PCT: 2 %
Eosinophils Absolute: 0.1 10*3/uL (ref 0–0.7)
HEMATOCRIT: 41.5 % (ref 40.0–52.0)
HEMOGLOBIN: 14.1 g/dL (ref 13.0–18.0)
LYMPHS ABS: 1 10*3/uL (ref 1.0–3.6)
LYMPHS PCT: 22 %
MCH: 32 pg (ref 26.0–34.0)
MCHC: 33.8 g/dL (ref 32.0–36.0)
MCV: 94.5 fL (ref 80.0–100.0)
MONO ABS: 0.4 10*3/uL (ref 0.2–1.0)
MONOS PCT: 9 %
NEUTROS ABS: 2.9 10*3/uL (ref 1.4–6.5)
Neutrophils Relative %: 66 %
Platelets: 210 10*3/uL (ref 150–440)
RBC: 4.4 MIL/uL (ref 4.40–5.90)
RDW: 15.5 % — ABNORMAL HIGH (ref 11.5–14.5)
WBC: 4.3 10*3/uL (ref 3.8–10.6)

## 2015-01-20 LAB — PSA: PSA: 19.93 ng/mL — AB (ref 0.00–4.00)

## 2015-01-20 MED ORDER — SODIUM CHLORIDE 0.9 % IV SOLN
Freq: Once | INTRAVENOUS | Status: AC
  Administered 2015-01-20: 15:00:00 via INTRAVENOUS
  Filled 2015-01-20: qty 1000

## 2015-01-20 MED ORDER — SODIUM CHLORIDE 0.9 % IV SOLN: 4.0000 mg | Freq: Once | INTRAVENOUS | Status: DC

## 2015-01-20 MED ORDER — ZOLEDRONIC ACID 4 MG/100ML IV SOLN
4.0000 mg | Freq: Once | INTRAVENOUS | Status: AC
  Administered 2015-01-20: 4 mg via INTRAVENOUS
  Filled 2015-01-20: qty 100

## 2015-01-20 MED ORDER — ZOLEDRONIC ACID 4 MG/5ML IV CONC: 4.0000 mg | Freq: Once | INTRAVENOUS | Status: DC

## 2015-01-20 MED ORDER — SODIUM CHLORIDE 0.9 % IJ SOLN
10.0000 mL | INTRAMUSCULAR | Status: DC | PRN
  Filled 2015-01-20: qty 10

## 2015-01-20 NOTE — Progress Notes (Signed)
Patient does not have living will.  Never smoke.

## 2015-01-20 NOTE — Progress Notes (Signed)
[facility] [id number] Protocol Research Encounter Note:  Pt. arrives in clinic today for cycle 3 enzalutamide, abiraterone, and prednisone treatment.  He did not bring his used Enzalutamide or medication calendars today.  He is willing to stop by tomorrow morning and drop off his calendars and drug and I will issue his new bottle of enzalutamide tomorrow.  He did not call [employer] to report his Medicare information and will do so this afternoon.  William Wright of the research office has already notified [facility] of the change in William Wright and forwarded his medicare information. He needs to confirm this afternoon that they are shipping out his drug so that he does not miss any doses of Abiraterone over the weekend. He has requested changing from Zometa to [facility] as he is receives multiple sticks before an IV can be placed.  Dr. Oliva Bustard is aware and will check with his new insurance to see if they will cover the cost of Xgeva.  His central labs have been drawn and his PSA is pending. He reports that he is doing well and remains active.  He has mild bone pain(grade 1, attribution unlikely) that he controls with Ibuprofen prn. He says that there are some days when he does not need to take Ibuprofen. Per the solicited adverse events form he continues to have some mild fatigue ( grade 1); hot flashes (grade 1) attribution unlikely; and hyperglycemia ( grade 1) attribution probable though this is a non-fasting value. He was seen by Dr. Oliva Bustard and approved for his Zometa infusion today. He will return to clinic in 2 weeks for lab only (lft's) and then in 4 weeks for Cycle 4 of chemotherapy.   01/21/2015 PSA result = 19.93  William Samples, RN

## 2015-01-21 ENCOUNTER — Ambulatory Visit: Payer: BLUE CROSS/BLUE SHIELD

## 2015-01-21 ENCOUNTER — Ambulatory Visit: Admission: RE | Admit: 2015-01-21 | Payer: Medicare Other | Source: Ambulatory Visit | Admitting: Urology

## 2015-01-21 ENCOUNTER — Encounter: Payer: Self-pay | Admitting: *Deleted

## 2015-01-21 ENCOUNTER — Encounter: Admission: RE | Payer: Self-pay | Source: Ambulatory Visit

## 2015-01-21 DIAGNOSIS — C61 Malignant neoplasm of prostate: Secondary | ICD-10-CM

## 2015-01-21 LAB — HEPATIC FUNCTION PANEL
ALT: 16 U/L — ABNORMAL LOW (ref 17–63)
AST: 12 U/L — ABNORMAL LOW (ref 15–41)
Albumin: 4.5 g/dL (ref 3.5–5.0)
Alkaline Phosphatase: 81 U/L (ref 38–126)
BILIRUBIN TOTAL: 0.5 mg/dL (ref 0.3–1.2)
Total Protein: 7.5 g/dL (ref 6.5–8.1)

## 2015-01-21 SURGERY — LITHOTRIPSY, ESWL
Anesthesia: Moderate Sedation | Laterality: Left

## 2015-01-21 NOTE — Progress Notes (Signed)
[facility] [id number] PROTOCOL: Patient's insurance changed on January 15, 2015 to Medicare necessitating transfer of his Abiraterone prescription from [facility] to [employer] ([facility] is out of network of medicare prescription D plan). [place] ([facility]) was contacted to assure that they had received all necessary information (inclusive of patients name, address, phone number, date of birth, medicare number, allergy history, concommitent medications, provider name, diagnosis and CD10 code) to process the Zytiga prescription. The [facility] had not received information from [facility] when the [facility] called to check up on Zytiga shipment.  Necessary information sent to [facility].  Pt. was contacted by [facility] and informed based on his Medicare D plan he had an out of pocket cost of $ 2,932.62  To be paid prior to receiving drug. Pt. is unable to afford copay. Multiple attempts made to find financial assistance for patient that included [place], [employer] financial assistance, [place] to no avail. [employer] was denied assistance back in May of 2016 due to patient having commercial insurance.  Now due to change in insurance and Medicare coverage [employer] is reconsidering the patient's application. Currently these is no foundation that is providing financial assistance for prostate cancer.   01/22/2015- The local Zytiga drug representative, Sheryn Bison, was contacted and she offered to supply 30 days of Zytiga samples.  She will deliver the drug to the Research Office this afternoon and the patient will pick it up.  At this juncture it is uncertain what this means in terms of his treatment per [facility] protocol. Per protocol the  patient is on Enzalutamide, Prednisone, and Abiraterone.  He is covered only for 30 days and in the interim  [employer] is reconsidering his application for financial assistance.   AddendumSheryn Bison delivered 1 bottle (#120 capsules) of Abiraterone.  The patient was called and he stopped by the [facility] to pick up his drug at 3:11 pm.   Jake Samples, RN

## 2015-01-23 ENCOUNTER — Encounter: Payer: Self-pay | Admitting: Oncology

## 2015-01-23 NOTE — Progress Notes (Signed)
[place] @ [facility] [phone]  Fax:([phone]) [place]: [birth year]  MR#: [id number]  YTK#:[id number]  Patient Care Team: No Pcp Per Patient as PCP - General (General Practice)  CHIEF COMPLAINT:  Chief Complaint  Patient presents with  . 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 Patient was on androgen deprivation therapy as well as Casodex.  Rising tumor marker.  Bone scan as well as CT scan was positive for extensive metastases Patient is off Casodex approximately 4 weeks ago HPI:   Patient is here for further follow-up regarding castration resistant prostate cancer  Oncology History   carcinoma  of prostatestage IIc, and  T2 N0 M0 diagnosis in February of 2014 patient had PSA of 18 patient antiandrogentherapyand therapy.  According to him in May of 2015 patient had PSA of 90 was started on androgen deprivation therapy any dropped to 20.  And then 18 later on.  Now PSA is rising againto 26 documented on October 03 2014.  Patient's bone scan and CT revealed extensive metastasis Patient had received Casodex and Trelstar.  2.  Castration resistant prostate cancer.  Patient was started on now ZYTIGA plus minus XTANDI in June of 2016       Cancer of prostate   08/17/2012 Initial Diagnosis Cancer of prostate, stage II   12/03/2013 Progression    10/13/2014 Progression     Oncology Flowsheet 11/25/2014 12/23/2014 01/20/2015  Zoledronic Acid (ZOMETA) IV 4 mg 4 mg 4 mg    INTERVAL HISTORY:  Patient is on Alliance protocol receiving ZYTIGA plus minus XTANDI.  Abdominal distention and abdominal pain is improved.  No nausea no vomiting.  Appetite is improving.  No bony pains REVIEW OF SYSTEMS:   GENERAL:  Feels good.  Active.  No fevers, sweats or weight loss. PERFORMANCE STATUS (ECOG): 0 HEENT:  No visual changes, runny nose, sore throat, mouth sores or tenderness. Lungs: No shortness of breath or cough.  No  hemoptysis. Cardiac:  No chest pain, palpitations, orthopnea, or PND. GI:  No nausea, vomiting, diarrhea, constipation, melena or hematochezia. GU:  No urgency, frequency, dysuria, or hematuria. Musculoskeletal:  No back pain.  No joint pain.  No muscle tenderness. Extremities:  No pain or swelling. Skin:  No rashes or skin changes. Neuro:  No headache, numbness or weakness, balance or coordination issues. Endocrine:  No diabetes, thyroid issues, hot flashes or night sweats. Psych:  No mood changes, depression or anxiety. Pain:  No focal pain. Review of systems:  All other systems reviewed and found to be negative. As per HPI. Otherwise, a complete review of systems is negatve.  PAST MEDICAL HISTORY: Past Medical History  Diagnosis Date  . Prostate cancer   . Cancer of prostate 11/18/2014    Significant History/PMH:   Prostate Cancer:   Preventive Screening:  Has patient had any of the following test? Colonscopy  Prostate Exam (1)   Last Colonoscopy: October 2013(1)   Last Prostate Exam: 2015(1)   Smoking History: Smoking History Never Smoked.(1)  PFSH: Comments: no significant family history of any malignancy.  Social History: negative alcohol, negative tobacco  Additional Past Medical and Surgical History: no significant history of hypertension or diabetes or coronary artery disease   ADVANCED DIRECTIVES:  Patient does have advance healthcare directive, Patient   does not desire to make any changes HEALTH MAINTENANCE: History  Substance Use Topics  . Smoking status: Never Smoker   . Smokeless  tobacco: Not on file  . Alcohol Use: No      Allergies  Allergen Reactions  . No Known Allergies     Current Outpatient Prescriptions  Medication Sig Dispense Refill  . abiraterone Acetate (ZYTIGA) 250 MG tablet Take 4 tablets (1,000 mg total) by mouth daily. Take on an empty stomach 1 hour before or 2 hours after a meal 120 tablet 0  . HYDROcodone-acetaminophen  (NORCO/VICODIN) 5-325 MG per tablet Take 1 tablet by mouth every 6 (six) hours as needed for moderate pain.    . Investigational enzalutamide 40 MG capsule ALLIANCE [id number] Take 4 capsules (160 mg total) by mouth daily. Take with or without food.Swallow whole. Do not crush, or open capsules. 120 capsule 0  . Investigational enzalutamide 40 MG capsule ALLIANCE [id number] Take 4 capsules (160 mg total) by mouth daily. Take with or without food.Swallow whole. Do not crush, or open capsules. 120 capsule 0  . predniSONE (DELTASONE) 5 MG tablet Take 1 tablet (5 mg total) by mouth 2 (two) times daily with a meal. 60 tablet 6  . predniSONE (DELTASONE) 5 MG tablet Take 1 tablet (5 mg total) by mouth 2 (two) times daily. 60 tablet 0  . Triptorelin Pamoate (TRELSTAR) 22.5 MG injection Inject 22.5 mg into the muscle every 6 (six) months.     No current facility-administered medications for this visit.   Facility-Administered Medications Ordered in Other Visits  Medication Dose Route Frequency Provider Last Rate Last Dose  . sodium chloride 0.9 % injection 10 mL  10 mL Intracatheter PRN Forest Gleason, MD        OBJECTIVE:  Filed Vitals:   01/20/15 1418  BP: 136/74  Pulse: 73  Temp: 97.2 F (36.2 C)     Body mass index is 24.55 kg/(m^2).    ECOG FS:0 - Asymptomatic  PHYSICAL EXAM: GENERAL:  Well developed, well nourished, sitting comfortably in the exam room in no acute distress. MENTAL STATUS:  Alert and oriented to person, place and time. HEAD:   hair.  Normocephalic, atraumatic, face symmetric, no Cushingoid features. EYES:    Pupils equal round and reactive to light and accomodation.  No conjunctivitis or scleral icterus. ENT:  Oropharynx clear without lesion.  Tongue normal. Mucous membranes moist.  RESPIRATORY:  Clear to auscultation without rales, wheezes or rhonchi. CARDIOVASCULAR:  Regular rate and rhythm without murmur, rub or gallop. . ABDOMEN:  Soft, non-tender, with active bowel sounds,  and no hepatosplenomegaly.  No masses. BACK:  No CVA tenderness.  No tenderness on percussion of the back or rib cage. SKIN:  No rashes, ulcers or lesions. EXTREMITIES: No edema, no skin discoloration or tenderness.  No palpable cords. LYMPH NODES: No palpable cervical, supraclavicular, axillary or inguinal adenopathy  NEUROLOGICAL: Unremarkable. PSYCH:  Appropriate.   LAB RESULTS:  Appointment on 01/20/2015  Component Date Value Ref Range Status  . Sodium 01/20/2015 140  135 - 145 mmol/L Final  . Potassium 01/20/2015 4.0  3.5 - 5.1 mmol/L Final  . Chloride 01/20/2015 104  101 - 111 mmol/L Final  . CO2 01/20/2015 27  22 - 32 mmol/L Final  . Glucose, Bld 01/20/2015 106* 65 - 99 mg/dL Final  . BUN 01/20/2015 24* 6 - 20 mg/dL Final  . Creatinine, Ser 01/20/2015 1.07  0.61 - 1.24 mg/dL Final  . Calcium 01/20/2015 9.2  8.9 - 10.3 mg/dL Final  . GFR calc non Af Amer 01/20/2015 >60  >60 mL/min Final  . GFR calc Af Amer 01/20/2015 >  60  >60 mL/min Final   Comment: (NOTE) The eGFR has been calculated using the CKD EPI equation. This calculation has not been validated in all clinical situations. eGFR's persistently <60 mL/min signify possible Chronic Kidney Disease.   . Anion gap 01/20/2015 9  5 - 15 Final  . WBC 01/20/2015 4.3  3.8 - 10.6 K/uL Final  . RBC 01/20/2015 4.40  4.40 - 5.90 MIL/uL Final  . Hemoglobin 01/20/2015 14.1  13.0 - 18.0 g/dL Final  . HCT 01/20/2015 41.5  40.0 - 52.0 % Final  . MCV 01/20/2015 94.5  80.0 - 100.0 fL Final  . MCH 01/20/2015 32.0  26.0 - 34.0 pg Final  . MCHC 01/20/2015 33.8  32.0 - 36.0 g/dL Final  . RDW 01/20/2015 15.5* 11.5 - 14.5 % Final  . Platelets 01/20/2015 210  150 - 440 K/uL Final  . Neutrophils Relative % 01/20/2015 66   Final  . Neutro Abs 01/20/2015 2.9  1.4 - 6.5 K/uL Final  . Lymphocytes Relative 01/20/2015 22   Final  . Lymphs Abs 01/20/2015 1.0  1.0 - 3.6 K/uL Final  . Monocytes Relative 01/20/2015 9   Final  . Monocytes Absolute  01/20/2015 0.4  0.2 - 1.0 K/uL Final  . Eosinophils Relative 01/20/2015 2   Final  . Eosinophils Absolute 01/20/2015 0.1  0 - 0.7 K/uL Final  . Basophils Relative 01/20/2015 1   Final  . Basophils Absolute 01/20/2015 0.0  0 - 0.1 K/uL Final  . PSA 01/20/2015 19.93* 0.00 - 4.00 ng/mL Final   Comment: (NOTE) While PSA levels of <=4.0 ng/ml are reported as reference range, some men with levels below 4.0 ng/ml can have prostate cancer and many men with PSA above 4.0 ng/ml do not have prostate cancer.  Other tests such as free PSA, age specific reference ranges, PSA velocity and PSA doubling time may be helpful especially in men less than 99 years old. Performed at [facility]       STUDIES: Ct Chest W Contrast  01/21/2015   ADDENDUM REPORT: 01/21/2015 16:29  ADDENDUM: Comparison with 11/12/2014 CT CAP  Recist protocol:  1. Retrocrural lymph node on image 51 measures 27 x 12 mm. Previous 27 x 13 mm. 2. Left periaortic lymph node on image number 66 measures 27 x 13 mm. Previous 27 x 14 mm. 3. Lymph node superior to the pancreas (celiac axis) on image number 59 measures 24 x 15 mm. Previous 26 x 16 mm. 4. Portal caval lymph node on image number 61 measures 16 x 11 mm. Previous 18.5 x 11.5 mm. Reviewing the bone lesions when compared to 11/12/2014, some lesions have progressed. There are new and enlarging lesions. This is most notable in the L2 vertebral body and T12 vertebral bodies.   Electronically Signed   By: Marijo Sanes M.D.   On: 01/21/2015 16:29   01/21/2015   CLINICAL DATA:  Restaging metastatic prostate cancer.  EXAM: CT CHEST, ABDOMEN, AND PELVIS WITH CONTRAST  TECHNIQUE: Multidetector CT imaging of the chest, abdomen and pelvis was performed following the standard protocol during bolus administration of intravenous contrast.  CONTRAST:  56mL OMNIPAQUE IOHEXOL 300 MG/ML  SOLN  COMPARISON:  CT abdomen/ pelvis 10/14/2014  FINDINGS: CT CHEST FINDINGS  Chest wall: No chest wall mass,  supraclavicular or axillary adenopathy. The thyroid gland appears normal.  Numerous sclerotic metastatic bone lesions are noted involving the ribs, sternum and spine. No pathologic fracture.  Mediastinum: The heart is normal in size.  No pericardial effusion. 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 No dissection. Coronary artery calcifications are noted. String in the ill-defined density in the anterior mediastinum is likely residual thymic tissue. No mediastinal or hilar mass or lymphadenopathy. The esophagus is grossly normal.  Lungs/ pleura: Mild emphysematous changes. No acute pulmonary findings. There are some streaky/patchy areas of atelectasis or scarring but no findings for pulmonary metastasis.  CT ABDOMEN AND PELVIS FINDINGS  Hepatobiliary: No focal hepatic lesions or intrahepatic biliary dilatation. The gallbladder is normal. No common bile duct dilatation.  Pancreas: No pancreatic mass, inflammation or ductal dilatation.  Spleen: Normal size.  No focal lesions.  Adrenals/Urinary Tract: The adrenal glands are normal and stable. There are bilateral renal calculi but no obstructing ureteral calculi or bladder calculi. No worrisome renal lesions.  Stomach/Bowel: The stomach, duodenum, small bowel and colon are unremarkable. No inflammatory changes, mass lesions or obstructive findings. The terminal ileum is normal. The appendix is normal. Moderate stool throughout the colon may suggest constipation.  Vascular/Lymphatic: There are enlarging retroperitoneal lymph nodes when compared to the prior CT scan.  A celiac axis lymph node on image number 59 measures 12 mm and is stable.  Small lymph nodes between the portal vein and IVC are stable.  There is a new cluster of lymph nodes near the right adrenal gland.  Left retroperitoneal lymph node located chest posterior to the left renal  artery measures 16.5 x 10.5 mm and measured 16.5 x 7.5 mm on the prior study.  Stable interaortocaval lymph nodes.  Other: The bladder, prostate gland and seminal vesicles are unremarkable. No pelvic mass or pelvic lymphadenopathy. Small external iliac lymph nodes are unchanged. No inguinal mass or adenopathy.  Musculoskeletal: The pelvic sclerotic bone lesions are stable. No new lesions are identified.  Progressive metastatic disease involving the T12 vertebral body with early canal invasion on the right side. The right-sided lesion has approximately doubled in size when compared to the prior examination.  Progressive disease in the L2 vertebral body. The large left-sided lesion is stable but there is new or progressive disease on the right side involving the right pedicle. Minimal canal encroachment.  Stable advanced disease at L3.  No pathologic fracture.  Stable bilateral mixed lytic and sclerotic lesions in the T10 vertebral body.  IMPRESSION: 1. Stable and progressive metastatic bone lesions as detailed above. 2. Mainly stable abdominal and pelvic lymph nodes. New cluster of nodes noted near the right adrenal gland and a slightly larger left-sided retroperitoneal lymph node. 3. No findings for pulmonary metastatic disease or supraclavicular/mediastinal or hilar disease. 4. 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 Recommend annual imaging followup by CTA or MRA. This recommendation follows 2010 ACCF/AHA/AATS/ACR/ASA/SCA/SCAI/SIR/STS/SVM Guidelines for the Diagnosis and Management of Patients with Thoracic Aortic Disease. Circulation. 2010; 121: B048-G891  Electronically Signed: By: Marijo Sanes M.D. On: 01/15/2015 11:29   Ct Abdomen Pelvis W Contrast  01/21/2015   ADDENDUM REPORT: 01/21/2015 16:29  ADDENDUM: Comparison with 11/12/2014 CT CAP  Recist protocol:  1. Retrocrural lymph node on image 51 measures 27 x 12 mm. Previous 27 x 13 mm. 2. Left  periaortic lymph node on image number 66 measures 27 x 13 mm. Previous 27 x 14 mm. 3. Lymph node superior to the pancreas (celiac axis) on image number 59 measures 24 x  15 mm. Previous 26 x 16 mm. 4. Portal caval lymph node on image number 61 measures 16 x 11 mm. Previous 18.5 x 11.5 mm. Reviewing the bone lesions when compared to 11/12/2014, some lesions have progressed. There are new and enlarging lesions. This is most notable in the L2 vertebral body and T12 vertebral bodies.   Electronically Signed   By: Marijo Sanes M.D.   On: 01/21/2015 16:29   01/21/2015   CLINICAL DATA:  Restaging metastatic prostate cancer.  EXAM: CT CHEST, ABDOMEN, AND PELVIS WITH CONTRAST  TECHNIQUE: Multidetector CT imaging of the chest, abdomen and pelvis was performed following the standard protocol during bolus administration of intravenous contrast.  CONTRAST:  102mL OMNIPAQUE IOHEXOL 300 MG/ML  SOLN  COMPARISON:  CT abdomen/ pelvis 10/14/2014  FINDINGS: CT CHEST FINDINGS  Chest wall: No chest wall mass, supraclavicular or axillary adenopathy. The thyroid gland appears normal.  Numerous sclerotic metastatic bone lesions are noted involving the ribs, sternum and spine. No pathologic fracture.  Mediastinum: The heart is normal in size. No pericardial effusion. 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 No dissection. Coronary artery calcifications are noted. String in the ill-defined density in the anterior mediastinum is likely residual thymic tissue. No mediastinal or hilar mass or lymphadenopathy. The esophagus is grossly normal.  Lungs/ pleura: Mild emphysematous changes. No acute pulmonary findings. There are some streaky/patchy areas of atelectasis or scarring but no findings for pulmonary metastasis.  CT ABDOMEN AND PELVIS FINDINGS  Hepatobiliary: No focal hepatic lesions or intrahepatic biliary  dilatation. The gallbladder is normal. No common bile duct dilatation.  Pancreas: No pancreatic mass, inflammation or ductal dilatation.  Spleen: Normal size.  No focal lesions.  Adrenals/Urinary Tract: The adrenal glands are normal and stable. There are bilateral renal calculi but no obstructing ureteral calculi or bladder calculi. No worrisome renal lesions.  Stomach/Bowel: The stomach, duodenum, small bowel and colon are unremarkable. No inflammatory changes, mass lesions or obstructive findings. The terminal ileum is normal. The appendix is normal. Moderate stool throughout the colon may suggest constipation.  Vascular/Lymphatic: There are enlarging retroperitoneal lymph nodes when compared to the prior CT scan.  A celiac axis lymph node on image number 59 measures 12 mm and is stable.  Small lymph nodes between the portal vein and IVC are stable.  There is a new cluster of lymph nodes near the right adrenal gland.  Left retroperitoneal lymph node located chest posterior to the left renal artery measures 16.5 x 10.5 mm and measured 16.5 x 7.5 mm on the prior study.  Stable interaortocaval lymph nodes.  Other: The bladder, prostate gland and seminal vesicles are unremarkable. No pelvic mass or pelvic lymphadenopathy. Small external iliac lymph nodes are unchanged. No inguinal mass or adenopathy.  Musculoskeletal: The pelvic sclerotic bone lesions are stable. No new lesions are identified.  Progressive metastatic disease involving the T12 vertebral body with early canal invasion on the right side. The right-sided lesion has approximately doubled in size when compared to the prior examination.  Progressive disease in the L2 vertebral body. The large left-sided lesion is stable but there is new or progressive disease on the right side involving the right pedicle. Minimal canal encroachment.  Stable advanced disease at L3.  No pathologic fracture.  Stable bilateral mixed lytic and sclerotic lesions in the T10  vertebral body.  IMPRESSION: 1. Stable  and progressive metastatic bone lesions as detailed above. 2. Mainly stable abdominal and pelvic lymph nodes. New cluster of nodes noted near the right adrenal gland and a slightly larger left-sided retroperitoneal lymph node. 3. No findings for pulmonary metastatic disease or supraclavicular/mediastinal or hilar disease. 4. 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 Recommend annual imaging followup by CTA or MRA. This recommendation follows 2010 ACCF/AHA/AATS/ACR/ASA/SCA/SCAI/SIR/STS/SVM Guidelines for the Diagnosis and Management of Patients with Thoracic Aortic Disease. Circulation. 2010; 121: T075-P322  Electronically Signed: By: Marijo Sanes M.D. On: 01/15/2015 11:29    ASSESSMENT: Castration resistant prostate cancer Assessment with CT scan shows possibility of progressive disease in a bone bone scan result is pending By PSA criteria patient is responding to the treatment  MEDICAL DECISION MAKING:  CT scan has been reviewed independently.  There might be progressive disease with new additional lymph node.  Biopsy PSA criteria patient is responding to the treatment .Marland Kitchen  Bone scan and report is pending Would discuss this situation with protocol coordinator because we might have to take patient off protocol therapy.  Because of progressive disease We will continue Trelstar as well as Zometa which can be later on switched to XGEVA depending on the insurance approval .  Radiation therapy may be indicated to the L2 spine if it is not already done.   Patient expressed understanding and was in agreement with this plan. He also understands that He can call clinic at any time with any questions, concerns, or complaints.    Cancer of prostate   Staging form: Prostate, AJCC 7th Edition     Clinical: Stage IV (T2, M1) - Signed by Evlyn Kanner, NP on 01/07/2015   Forest Gleason, MD   01/23/2015 4:39  PM

## 2015-02-03 ENCOUNTER — Inpatient Hospital Stay: Payer: Medicare Other

## 2015-02-03 DIAGNOSIS — Z79899 Other long term (current) drug therapy: Secondary | ICD-10-CM | POA: Diagnosis not present

## 2015-02-03 DIAGNOSIS — C61 Malignant neoplasm of prostate: Secondary | ICD-10-CM

## 2015-02-03 DIAGNOSIS — C7951 Secondary malignant neoplasm of bone: Secondary | ICD-10-CM | POA: Diagnosis not present

## 2015-02-03 DIAGNOSIS — R972 Elevated prostate specific antigen [PSA]: Secondary | ICD-10-CM | POA: Diagnosis not present

## 2015-02-03 DIAGNOSIS — Z7952 Long term (current) use of systemic steroids: Secondary | ICD-10-CM | POA: Diagnosis not present

## 2015-02-03 DIAGNOSIS — Z79818 Long term (current) use of other agents affecting estrogen receptors and estrogen levels: Secondary | ICD-10-CM | POA: Diagnosis not present

## 2015-02-03 LAB — HEPATIC FUNCTION PANEL
ALT: 15 U/L — ABNORMAL LOW (ref 17–63)
AST: 16 U/L (ref 15–41)
Albumin: 4.5 g/dL (ref 3.5–5.0)
Alkaline Phosphatase: 75 U/L (ref 38–126)
BILIRUBIN INDIRECT: 0.8 mg/dL (ref 0.3–0.9)
Bilirubin, Direct: 0.1 mg/dL (ref 0.1–0.5)
Total Bilirubin: 0.9 mg/dL (ref 0.3–1.2)
Total Protein: 7.1 g/dL (ref 6.5–8.1)

## 2015-02-10 ENCOUNTER — Ambulatory Visit: Payer: Medicare Other

## 2015-02-10 ENCOUNTER — Ambulatory Visit: Payer: Medicare Other | Admitting: Oncology

## 2015-02-10 ENCOUNTER — Other Ambulatory Visit: Payer: Medicare Other

## 2015-02-17 ENCOUNTER — Encounter: Payer: Self-pay | Admitting: Oncology

## 2015-02-17 ENCOUNTER — Inpatient Hospital Stay: Payer: Medicare Other

## 2015-02-17 ENCOUNTER — Inpatient Hospital Stay: Payer: Medicare Other | Admitting: Oncology

## 2015-02-17 ENCOUNTER — Inpatient Hospital Stay: Payer: Medicare Other | Attending: Oncology

## 2015-02-17 ENCOUNTER — Encounter: Payer: Self-pay | Admitting: *Deleted

## 2015-02-17 VITALS — BP 145/82 | HR 66 | Temp 96.6°F | Resp 18 | Wt 169.5 lb

## 2015-02-17 DIAGNOSIS — C7951 Secondary malignant neoplasm of bone: Secondary | ICD-10-CM | POA: Diagnosis not present

## 2015-02-17 DIAGNOSIS — C61 Malignant neoplasm of prostate: Secondary | ICD-10-CM | POA: Diagnosis not present

## 2015-02-17 DIAGNOSIS — R109 Unspecified abdominal pain: Secondary | ICD-10-CM | POA: Diagnosis not present

## 2015-02-17 DIAGNOSIS — Z79899 Other long term (current) drug therapy: Secondary | ICD-10-CM | POA: Diagnosis not present

## 2015-02-17 DIAGNOSIS — R8290 Unspecified abnormal findings in urine: Secondary | ICD-10-CM | POA: Diagnosis not present

## 2015-02-17 DIAGNOSIS — Z7952 Long term (current) use of systemic steroids: Secondary | ICD-10-CM | POA: Insufficient documentation

## 2015-02-17 DIAGNOSIS — R630 Anorexia: Secondary | ICD-10-CM | POA: Diagnosis not present

## 2015-02-17 DIAGNOSIS — R312 Other microscopic hematuria: Secondary | ICD-10-CM | POA: Insufficient documentation

## 2015-02-17 DIAGNOSIS — Z006 Encounter for examination for normal comparison and control in clinical research program: Secondary | ICD-10-CM | POA: Insufficient documentation

## 2015-02-17 DIAGNOSIS — R110 Nausea: Secondary | ICD-10-CM | POA: Diagnosis not present

## 2015-02-17 LAB — CBC WITH DIFFERENTIAL/PLATELET
BASOS PCT: 1 %
Basophils Absolute: 0 10*3/uL (ref 0–0.1)
Eosinophils Absolute: 0.1 10*3/uL (ref 0–0.7)
Eosinophils Relative: 2 %
HCT: 41.1 % (ref 40.0–52.0)
Hemoglobin: 14 g/dL (ref 13.0–18.0)
Lymphocytes Relative: 38 %
Lymphs Abs: 1.4 10*3/uL (ref 1.0–3.6)
MCH: 32.3 pg (ref 26.0–34.0)
MCHC: 34.2 g/dL (ref 32.0–36.0)
MCV: 94.5 fL (ref 80.0–100.0)
MONO ABS: 0.5 10*3/uL (ref 0.2–1.0)
MONOS PCT: 14 %
Neutro Abs: 1.7 10*3/uL (ref 1.4–6.5)
Neutrophils Relative %: 45 %
Platelets: 196 10*3/uL (ref 150–440)
RBC: 4.35 MIL/uL — ABNORMAL LOW (ref 4.40–5.90)
RDW: 14.9 % — AB (ref 11.5–14.5)
WBC: 3.8 10*3/uL (ref 3.8–10.6)

## 2015-02-17 LAB — COMPREHENSIVE METABOLIC PANEL
ALK PHOS: 64 U/L (ref 38–126)
ALT: 12 U/L — ABNORMAL LOW (ref 17–63)
AST: 15 U/L (ref 15–41)
Albumin: 4.4 g/dL (ref 3.5–5.0)
Anion gap: 5 (ref 5–15)
BUN: 20 mg/dL (ref 6–20)
CALCIUM: 8.6 mg/dL — AB (ref 8.9–10.3)
CO2: 28 mmol/L (ref 22–32)
CREATININE: 0.9 mg/dL (ref 0.61–1.24)
Chloride: 105 mmol/L (ref 101–111)
GFR calc Af Amer: >60 mL/min (ref >60)
GFR calc non Af Amer: >60 mL/min (ref >60)
GLUCOSE: 100 mg/dL — AB (ref 65–99)
Potassium: 3.8 mmol/L (ref 3.5–5.1)
Sodium: 138 mmol/L (ref 135–145)
Total Bilirubin: 0.7 mg/dL (ref 0.3–1.2)
Total Protein: 7.3 g/dL (ref 6.5–8.1)

## 2015-02-17 LAB — PSA: PSA: 21.38 ng/mL — AB (ref 0.00–4.00)

## 2015-02-17 MED ORDER — ABIRATERONE ACETATE 250 MG PO TABS: 1000.0000 mg | ORAL_TABLET | Freq: Every day | ORAL | Status: DC

## 2015-02-17 MED ORDER — DENOSUMAB 120 MG/1.7ML ~~LOC~~ SOLN
120.0000 mg | Freq: Once | SUBCUTANEOUS | Status: AC
  Administered 2015-02-17: 120 mg via SUBCUTANEOUS
  Filled 2015-02-17: qty 1.7

## 2015-02-17 MED ORDER — PREDNISONE 5 MG PO TABS: 5.0000 mg | ORAL_TABLET | Freq: Two times a day (BID) | ORAL | Status: DC

## 2015-02-17 MED ORDER — INV-ENZALUTAMIDE 40 MG CAPS #120 ALLIANCE A031201: 160.0000 mg | ORAL_CAPSULE | Freq: Every day | ORAL | Status: DC

## 2015-02-17 NOTE — Progress Notes (Signed)
Patient does not have living will.  Never smoked. 

## 2015-02-17 NOTE — Progress Notes (Signed)
02/17/15 @ 10:55am William Wright returns to clinic this morning for consideration of Cycle 4 treatment with Enzalutamide, Abiraterone & Prednisone. Pt states he is doing well today and denies any problems taking his medications. He has returned his completed medication diaries along with all of his pill bottles. Per patient med diaries, he took enzalutamide 4 caps each morning, abiraterone 4 tabs every evening, and Prednisone 5mg  each morning and evening - all as prescribed. He reports having taken one dose of Enzalutamide in the afternoon because he forgot to take it that morning. Reminded not to take the dose if it has been more than 6 hours since his usual medication time. Patient returned 12 Enzalutamide capsules, which are in his pillbox, as well as the empty pill bottle. The extra pills were sent back with the patient since already in his pillbox. He had 16 capsules of Abiraterone in his pillbox, with 9 caps still in the bottle and brought his new bottle of 120 capsules with him this morning. He has 7 Prednisone tablets in his pillbox and 7 more in his pill bottle. Patient states he will pick up a refill of the Prednisone this weekend from his local pharmacy. PK questionnaire was reviewed with patient and completed during office visit. States he still has difficulty understanding how to complete the form, but was able to provide the information required regarding meals before and after medication dosing. Solicited adverse events reviewed and positive responses related to study drug include fatigue; which is definitely related to the study drug. His blood pressure was also elevated at 145/82 and this is probably related to study drug. Unrelated events include bone pain; unlikely related to study drug since patient states he experienced it previously, and hot flashes; which patient has experienced since beginning Trelstar. [name] was seen by Dr. Oliva Bustard for H&P. VS and weight are stable. CBC and  chemistry values are all within acceptable parameters to proceed with Cycle 4 chemotherapy. Patient now receiving his abiraterone Fabio Asa) through his new [facility] pharmacy [facility], and co-pay assistance has been approved through [employer] patient assistance program.Study drug Enzalutamide 40mg  caps, #120 bottle provided to patient per [place] after chemo orders were signed. New medication diaries for the next cycle were provided to patient along with instructions on how/when to take each drug. Instructed to call if he experiences any new or more serious side effects from any of the medication. Patient also received his Delton See and was given an appointment to return in 4 weeks for his next chemotherapy cycle. Patient reported AEs along with grade and attribution as follows:      Fatigue - grade 1; definitely related to study drug      Bone pain - grade 1; unlikely related to study drug      Hot flashes - grade 1; unlikely related to study drug      Hypertension - grade 1; probably related to study drug

## 2015-02-18 ENCOUNTER — Telehealth: Payer: Self-pay | Admitting: *Deleted

## 2015-02-18 NOTE — Telephone Encounter (Signed)
11:40am T/C made to [facility] to inform of CT and bone scan appt dates/times. Patient instructed that his CT will be performed on Wednesday - 03/10/15 at 9:00am and he will need to pick up a prep kit prior to this day; and his bone scan will be on Friday - 03/12/15 at 9:00am for injection and 12:00 for the scan. Both scans will be at [facility]. Scheduler will mail these appts out to the patient as well. Patient states he is very familiar with the scan procees now. Also informed patient of his PSA level yesterday; which was 21.38 - just a little higher than last month. Patient also asked that his next appt with Dr. Oliva Bustard on 03/17/15 be scheduled in the morning because he picks up his grandchild from school at 3:00 and this appt conflicts with that. Inst that I will get that changed and let him know the new appt time.  11:58am T/C made to patient's home and spoke with his wife. She states Mr. Bothwell is not at home right now. Message left with her that patient's appt. On 03/17/15 has been changed to 10:15am at his request and that the new appt will be mailed to him. States she will let him know when he gets back home.  KSS

## 2015-02-27 ENCOUNTER — Encounter: Payer: Self-pay | Admitting: Oncology

## 2015-02-27 NOTE — Progress Notes (Signed)
[place] @ [facility] Telephone:(336) [phone]  Fax:(336) [place]: 06-Apr-1951  MR#: [id number]  JOI#:[id number]  Patient Care Team: No Pcp Per Patient as PCP - General (General Practice)  CHIEF COMPLAINT:  Chief Complaint  Patient presents with  . 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 Patient was on androgen deprivation therapy as well as Casodex.  Rising tumor marker.  Bone scan as well as CT scan was positive for extensive metastases Patient is off Casodex approximately 4 weeks ago Patient is on [facility] protocol HPI:   Patient is here for further follow-up regarding castration resistant prostate cancer  Oncology History   carcinoma  of prostatestage IIc, and  T2 N0 M0 diagnosis in February of 2014 patient had PSA of 18 patient antiandrogentherapyand therapy.  According to him in May of 2015 patient had PSA of 90 was started on androgen deprivation therapy any dropped to 20.  And then 18 later on.  Now PSA is rising againto 26 documented on October 03 2014.  Patient's bone scan and CT revealed extensive metastasis Patient had received Casodex and Trelstar.  2.  Castration resistant prostate cancer.  Patient was started on now ZYTIGA plus minus XTANDI in June of 2016       Cancer of prostate   08/17/2012 Initial Diagnosis Cancer of prostate, stage II   12/03/2013 Progression    10/13/2014 Progression     Oncology Flowsheet 11/25/2014 12/23/2014 01/20/2015 02/17/2015  denosumab (XGEVA) [place] - - - 120 mg  Zoledronic Acid (ZOMETA) IV 4 mg 4 mg 4 mg -    INTERVAL HISTORY:  Patient is on [facility] protocol receiving ZYTIGA plus minus XTANDI.  Abdominal distention and abdominal pain is improved.  No nausea no vomiting.  Appetite is improving.  No bony pains REVIEW OF SYSTEMS:   GENERAL:  Feels good.  Active.  No fevers, sweats or weight loss. PERFORMANCE STATUS (ECOG): 0 HEENT:  No visual changes, runny nose, sore throat, mouth  sores or tenderness. Lungs: No shortness of breath or cough.  No hemoptysis. Cardiac:  No chest pain, palpitations, orthopnea, or PND. GI:  No nausea, vomiting, diarrhea, constipation, melena or hematochezia. GU:  No urgency, frequency, dysuria, or hematuria. Musculoskeletal:  No back pain.  No joint pain.  No muscle tenderness. Extremities:  No pain or swelling. Skin:  No rashes or skin changes. Neuro:  No headache, numbness or weakness, balance or coordination issues. Endocrine:  No diabetes, thyroid issues, hot flashes or night sweats. Psych:  No mood changes, depression or anxiety. Pain:  No focal pain. Review of systems:  All other systems reviewed and found to be negative. As per HPI. Otherwise, a complete review of systems is negatve.  PAST MEDICAL HISTORY: Past Medical History  Diagnosis Date  . Prostate cancer   . Cancer of prostate 11/18/2014    Significant History/PMH:   Prostate Cancer:   Preventive Screening:  Has patient had any of the following test? Colonscopy  Prostate Exam (1)   Last Colonoscopy: October 2013(1)   Last Prostate Exam: 2015(1)   Smoking History: Smoking History Never Smoked.(1)  PFSH: Comments: no significant family history of any malignancy.  Social History: negative alcohol, negative tobacco  Additional Past Medical and Surgical History: no significant history of hypertension or diabetes or coronary artery disease   ADVANCED DIRECTIVES:  Patient does have advance healthcare directive, Patient   does not desire to make any changes HEALTH  MAINTENANCE: Social History  Substance Use Topics  . Smoking status: Never Smoker   . Smokeless tobacco: None  . Alcohol Use: No      Allergies  Allergen Reactions  . No Known Allergies     Current Outpatient Prescriptions  Medication Sig Dispense Refill  . abiraterone Acetate (ZYTIGA) 250 MG tablet Take 4 tablets (1,000 mg total) by mouth daily. Take on an empty stomach 1 hour before or 2  hours after a meal 120 tablet 0  . HYDROcodone-acetaminophen (NORCO/VICODIN) 5-325 MG per tablet Take 1 tablet by mouth every 6 (six) hours as needed for moderate pain.    . Investigational enzalutamide 40 MG capsule [facility] [id number] Take 4 capsules (160 mg total) by mouth daily. Take with or without food.Swallow whole. Do not crush, or open capsules. 120 capsule 0  . Investigational enzalutamide 40 MG capsule [facility] [id number] Take 4 capsules (160 mg total) by mouth daily. Take with or without food.Swallow whole. Do not crush, or open capsules. 120 capsule 0  . predniSONE (DELTASONE) 5 MG tablet Take 1 tablet (5 mg total) by mouth 2 (two) times daily with a meal. 60 tablet 6  . predniSONE (DELTASONE) 5 MG tablet Take 1 tablet (5 mg total) by mouth 2 (two) times daily. 60 tablet 0  . Triptorelin Pamoate (TRELSTAR) 22.5 MG injection Inject 22.5 mg into the muscle every 6 (six) months.    Marland Kitchen abiraterone Acetate (ZYTIGA) 250 MG tablet Take 4 tablets (1,000 mg total) by mouth daily. Take on an empty stomach 1 hour before or 2 hours after a meal 120 tablet 0  . Investigational enzalutamide 40 MG capsule [facility] [id number] Take 4 capsules (160 mg total) by mouth daily. Take with or without food.Swallow whole. Do not crush, or open capsules. 120 capsule 0  . predniSONE (DELTASONE) 5 MG tablet Take 1 tablet (5 mg total) by mouth 2 (two) times daily. 60 tablet 0   No current facility-administered medications for this visit.    OBJECTIVE:  Filed Vitals:   02/17/15 1054  BP: 145/82  Pulse: 66  Temp: 96.6 F (35.9 C)  Resp: 18     Body mass index is 24.27 kg/(m^2).    ECOG FS:0 - Asymptomatic  PHYSICAL EXAM: GENERAL:  Well developed, well nourished, sitting comfortably in the exam room in no acute distress. MENTAL STATUS:  Alert and oriented to person, place and time. HEAD:   hair.  Normocephalic, atraumatic, face symmetric, no Cushingoid features. EYES:    Pupils equal round and reactive to light and  accomodation.  No conjunctivitis or scleral icterus. ENT:  Oropharynx clear without lesion.  Tongue normal. Mucous membranes moist.  RESPIRATORY:  Clear to auscultation without rales, wheezes or rhonchi. CARDIOVASCULAR:  Regular rate and rhythm without murmur, rub or gallop. . ABDOMEN:  Soft, non-tender, with active bowel sounds, and no hepatosplenomegaly.  No masses. BACK:  No CVA tenderness.  No tenderness on percussion of the back or rib cage. SKIN:  No rashes, ulcers or lesions. EXTREMITIES: No edema, no skin discoloration or tenderness.  No palpable cords. LYMPH NODES: No palpable cervical, supraclavicular, axillary or inguinal adenopathy  NEUROLOGICAL: Unremarkable. PSYCH:  Appropriate.   LAB RESULTS:  Appointment on 02/17/2015  Component Date Value Ref Range Status  . WBC 02/17/2015 3.8  3.8 - 10.6 K/uL Final  . RBC 02/17/2015 4.35* 4.40 - 5.90 MIL/uL Final  . Hemoglobin 02/17/2015 14.0  13.0 - 18.0 g/dL Final  . HCT 02/17/2015 41.1  40.0 -  52.0 % Final  . MCV 02/17/2015 94.5  80.0 - 100.0 fL Final  . MCH 02/17/2015 32.3  26.0 - 34.0 pg Final  . MCHC 02/17/2015 34.2  32.0 - 36.0 g/dL Final  . RDW 02/17/2015 14.9* 11.5 - 14.5 % Final  . Platelets 02/17/2015 196  150 - 440 K/uL Final  . Neutrophils Relative % 02/17/2015 45   Final  . Neutro Abs 02/17/2015 1.7  1.4 - 6.5 K/uL Final  . Lymphocytes Relative 02/17/2015 38   Final  . Lymphs Abs 02/17/2015 1.4  1.0 - 3.6 K/uL Final  . Monocytes Relative 02/17/2015 14   Final  . Monocytes Absolute 02/17/2015 0.5  0.2 - 1.0 K/uL Final  . Eosinophils Relative 02/17/2015 2   Final  . Eosinophils Absolute 02/17/2015 0.1  0 - 0.7 K/uL Final  . Basophils Relative 02/17/2015 1   Final  . Basophils Absolute 02/17/2015 0.0  0 - 0.1 K/uL Final  . Sodium 02/17/2015 138  135 - 145 mmol/L Final  . Potassium 02/17/2015 3.8  3.5 - 5.1 mmol/L Final  . Chloride 02/17/2015 105  101 - 111 mmol/L Final  . CO2 02/17/2015 28  22 - 32 mmol/L Final  .  Glucose, Bld 02/17/2015 100* 65 - 99 mg/dL Final  . BUN 02/17/2015 20  6 - 20 mg/dL Final  . Creatinine, Ser 02/17/2015 0.90  0.61 - 1.24 mg/dL Final  . Calcium 02/17/2015 8.6* 8.9 - 10.3 mg/dL Final  . Total Protein 02/17/2015 7.3  6.5 - 8.1 g/dL Final  . Albumin 02/17/2015 4.4  3.5 - 5.0 g/dL Final  . AST 02/17/2015 15  15 - 41 U/L Final  . ALT 02/17/2015 12* 17 - 63 U/L Final  . Alkaline Phosphatase 02/17/2015 64  38 - 126 U/L Final  . Total Bilirubin 02/17/2015 0.7  0.3 - 1.2 mg/dL Final  . GFR calc non Af Amer 02/17/2015 >60  >60 mL/min Final  . GFR calc Af Amer 02/17/2015 >60  >60 mL/min Final   Comment: (NOTE) The eGFR has been calculated using the CKD EPI equation. This calculation has not been validated in all clinical situations. eGFR's persistently <60 mL/min signify possible Chronic Kidney Disease.   . Anion gap 02/17/2015 5  5 - 15 Final  . PSA 02/17/2015 21.38* 0.00 - 4.00 ng/mL Final   Comment: (NOTE) While PSA levels of <=4.0 ng/ml are reported as reference range, some men with levels below 4.0 ng/ml can have prostate cancer and many men with PSA above 4.0 ng/ml do not have prostate cancer.  Other tests such as free PSA, age specific reference ranges, PSA velocity and PSA doubling time may be helpful especially in men less than 73 years old. Performed at [facility]: No results found.  ASSESSMENT: Castration resistant prostate cancer Assessment with CT scan shows possibility of progressive disease in a bone bone scan result is pending By PSA criteria patient is responding to the treatment  MEDICAL DECISION MAKING:  CT scan has been reviewed independently.  There might be progressive disease with new additional lymph node.  Biopsy PSA criteria patient is responding to the treatment .Marland Kitchen  Bone scan and report is pending Would discuss this situation with protocol coordinator because we might have to take patient off protocol therapy.   Because of progressive disease We will continue Trelstar as well as Zometa which can be later on switched to XGEVA depending on the insurance approval .  Radiation therapy may be indicated to the L2 spine if it is not already done.   Patient expressed understanding and was in agreement with this plan. He also understands that He can call clinic at any time with any questions, concerns, or complaints.    Cancer of prostate   Staging form: Prostate, AJCC 7th Edition     Clinical: Stage IV (T2, M1) - Signed by Evlyn Kanner, NP on 01/07/2015   Forest Gleason, MD   02/27/2015 3:50 PM

## 2015-03-10 ENCOUNTER — Ambulatory Visit
Admission: RE | Admit: 2015-03-10 | Discharge: 2015-03-10 | Disposition: A | Discharge status: Home / Self Care | Payer: Medicare Other | Source: Ambulatory Visit | Attending: Oncology | Admitting: Oncology

## 2015-03-10 DIAGNOSIS — C61 Malignant neoplasm of prostate: Secondary | ICD-10-CM | POA: Insufficient documentation

## 2015-03-10 DIAGNOSIS — N200 Calculus of kidney: Secondary | ICD-10-CM | POA: Diagnosis not present

## 2015-03-10 DIAGNOSIS — I712 Thoracic aortic aneurysm, without rupture: Secondary | ICD-10-CM | POA: Diagnosis not present

## 2015-03-10 MED ORDER — IOHEXOL 300 MG/ML  SOLN
100.0000 mL | Freq: Once | INTRAMUSCULAR | Status: AC | PRN
  Administered 2015-03-10: 100 mL via INTRAVENOUS

## 2015-03-12 ENCOUNTER — Ambulatory Visit
Admission: RE | Admit: 2015-03-12 | Discharge: 2015-03-12 | Disposition: A | Discharge status: Home / Self Care | Payer: Medicare Other | Source: Ambulatory Visit | Attending: Oncology | Admitting: Oncology

## 2015-03-12 DIAGNOSIS — C61 Malignant neoplasm of prostate: Secondary | ICD-10-CM

## 2015-03-12 DIAGNOSIS — C7951 Secondary malignant neoplasm of bone: Secondary | ICD-10-CM | POA: Diagnosis not present

## 2015-03-12 MED ORDER — TECHNETIUM TC 99M MEDRONATE IV KIT
25.0000 | PACK | Freq: Once | INTRAVENOUS | Status: AC | PRN
  Administered 2015-03-12: 22.34 via INTRAVENOUS

## 2015-03-15 ENCOUNTER — Telehealth: Payer: Self-pay

## 2015-03-15 ENCOUNTER — Other Ambulatory Visit: Payer: Self-pay | Admitting: *Deleted

## 2015-03-15 DIAGNOSIS — C61 Malignant neoplasm of prostate: Secondary | ICD-10-CM

## 2015-03-15 MED ORDER — HYDROCODONE-ACETAMINOPHEN 5-325 MG PO TABS: 1.0000 | ORAL_TABLET | Freq: Four times a day (QID) | ORAL | Status: DC | PRN

## 2015-03-15 NOTE — Telephone Encounter (Addendum)
Received call from patient who stated he was not feeling well.  He gives history of having CT on Wednesday (Aug 24) and feeling a little bad afterwards, but with improvement.  He had bone scan on Friday (Aug 26) and felt well after this scan.  He states Friday night he began to have pain in his lower back (mostly on left side, but radiating at times to right side), nausea and 1 episode of vomiting. He took 1 norco which offered no relief.  For the remainder of the weekend, he has been resting and using a heating pad to attempt to alleviate pain. He did take 2 additional norco tablets through the course of the weekend with no real improvement to pain, but now is out of medication. (If refill is warranted, patient uses [facility] [place]).  He now has decreased appetite, continued nausea and continued pain.  Patient states he has had no fevers.  He has no history of reaction to dye or injections used for scans.  He does have a history of kidney stones.  I have relayed the above information to [facility] (Dr. Metro Kung nurse).  She will speak with Dr. Oliva Bustard then advise.  Patient can be reached at [phone] or cell [phone].    1600:  Spoke to [place], RN and Dr. Oliva Bustard and patient was advised to come to clinic tomorrow (instead of his scheduled visit on 03/17/15).  He now has an appointment at 9:15 tomorrow.  I informed patient of new appointment.  I also advised him that if the pain becomes unbearable overnight, he can go to emergency room for care.  Patient agreeable and states he feels he can continue to take acetaminophen to address his pain. Patient also informed that his central labs associated with his participation in the [facility] [phone] study will be collected tomorrow (instead of 03/17/15).

## 2015-03-16 ENCOUNTER — Inpatient Hospital Stay: Payer: Medicare Other | Admitting: *Deleted

## 2015-03-16 ENCOUNTER — Telehealth: Payer: Self-pay | Admitting: *Deleted

## 2015-03-16 ENCOUNTER — Encounter: Payer: Self-pay | Admitting: Oncology

## 2015-03-16 ENCOUNTER — Encounter: Payer: Self-pay | Admitting: *Deleted

## 2015-03-16 ENCOUNTER — Inpatient Hospital Stay (HOSPITAL_BASED_OUTPATIENT_CLINIC_OR_DEPARTMENT_OTHER): Payer: Medicare Other | Admitting: Oncology

## 2015-03-16 ENCOUNTER — Inpatient Hospital Stay: Payer: Medicare Other

## 2015-03-16 VITALS — BP 122/81 | HR 74 | Temp 96.3°F | Wt 166.0 lb

## 2015-03-16 DIAGNOSIS — Z006 Encounter for examination for normal comparison and control in clinical research program: Secondary | ICD-10-CM | POA: Diagnosis not present

## 2015-03-16 DIAGNOSIS — C7951 Secondary malignant neoplasm of bone: Secondary | ICD-10-CM | POA: Diagnosis not present

## 2015-03-16 DIAGNOSIS — N200 Calculus of kidney: Secondary | ICD-10-CM | POA: Diagnosis not present

## 2015-03-16 DIAGNOSIS — R110 Nausea: Secondary | ICD-10-CM | POA: Diagnosis not present

## 2015-03-16 DIAGNOSIS — C61 Malignant neoplasm of prostate: Secondary | ICD-10-CM

## 2015-03-16 DIAGNOSIS — Z7952 Long term (current) use of systemic steroids: Secondary | ICD-10-CM | POA: Diagnosis not present

## 2015-03-16 DIAGNOSIS — Z79899 Other long term (current) drug therapy: Secondary | ICD-10-CM | POA: Diagnosis not present

## 2015-03-16 LAB — CBC WITH DIFFERENTIAL/PLATELET
Basophils Absolute: 0 10*3/uL (ref 0–0.1)
Basophils Relative: 1 %
EOS ABS: 0 10*3/uL (ref 0–0.7)
EOS PCT: 1 %
HCT: 40 % (ref 40.0–52.0)
Hemoglobin: 13.7 g/dL (ref 13.0–18.0)
LYMPHS ABS: 1 10*3/uL (ref 1.0–3.6)
LYMPHS PCT: 14 %
MCH: 32.8 pg (ref 26.0–34.0)
MCHC: 34.3 g/dL (ref 32.0–36.0)
MCV: 95.8 fL (ref 80.0–100.0)
MONO ABS: 0.9 10*3/uL (ref 0.2–1.0)
Monocytes Relative: 13 %
Neutro Abs: 5.1 10*3/uL (ref 1.4–6.5)
Neutrophils Relative %: 71 %
PLATELETS: 187 10*3/uL (ref 150–440)
RBC: 4.18 MIL/uL — ABNORMAL LOW (ref 4.40–5.90)
RDW: 13.7 % (ref 11.5–14.5)
WBC: 7.1 10*3/uL (ref 3.8–10.6)

## 2015-03-16 LAB — COMPREHENSIVE METABOLIC PANEL
ALT: 10 U/L — ABNORMAL LOW (ref 17–63)
AST: 14 U/L — ABNORMAL LOW (ref 15–41)
Albumin: 4 g/dL (ref 3.5–5.0)
Alkaline Phosphatase: 67 U/L (ref 38–126)
Anion gap: 9 (ref 5–15)
BUN: 25 mg/dL — ABNORMAL HIGH (ref 6–20)
CHLORIDE: 104 mmol/L (ref 101–111)
CO2: 26 mmol/L (ref 22–32)
CREATININE: 1.52 mg/dL — AB (ref 0.61–1.24)
Calcium: 8.6 mg/dL — ABNORMAL LOW (ref 8.9–10.3)
GFR calc non Af Amer: 46 mL/min — ABNORMAL LOW (ref >60)
GFR, EST AFRICAN AMERICAN: 54 mL/min — AB (ref >60)
Glucose, Bld: 96 mg/dL (ref 65–99)
POTASSIUM: 3.7 mmol/L (ref 3.5–5.1)
SODIUM: 139 mmol/L (ref 135–145)
Total Bilirubin: 1.1 mg/dL (ref 0.3–1.2)
Total Protein: 7.6 g/dL (ref 6.5–8.1)

## 2015-03-16 LAB — URINALYSIS COMPLETE WITH MICROSCOPIC (ARMC ONLY)
Bilirubin Urine: NEGATIVE
Glucose, UA: NEGATIVE mg/dL
Nitrite: NEGATIVE
PH: 5 (ref 5.0–8.0)
PROTEIN: 100 mg/dL — AB
Specific Gravity, Urine: 1.036 — ABNORMAL HIGH (ref 1.005–1.030)

## 2015-03-16 LAB — PSA: PSA: 37.45 ng/mL — AB (ref 0.00–4.00)

## 2015-03-16 MED ORDER — CIPROFLOXACIN HCL 500 MG PO TABS: 500.0000 mg | ORAL_TABLET | Freq: Two times a day (BID) | ORAL | Status: DC

## 2015-03-16 MED ORDER — OXYCODONE HCL 5 MG PO TABA: 1.0000 | ORAL_TABLET | ORAL | Status: DC | PRN

## 2015-03-16 NOTE — Progress Notes (Signed)
William Wright @ [facility] Telephone:([phone]) [phone]  Fax:([phone]) [place]: August 26, 1950  MR#: [id number]  FOY#:[id number]  Patient Care Team: No Pcp Per Patient as PCP - General (General Practice)  carcinoma  of prostatestage IIc, and  T2 N0 M0 diagnosis in February of 2014 patient had PSA of 18 patient antiandrogentherapyand therapy.  According to him in May of 2015 patient had PSA of 90 was started on androgen deprivation therapy any dropped to 20.  And then 18 later on.  Now PSA is rising againto 26 documented on October 03 2014.  Patient's bone scan and CT revealed extensive metastasis Patient had received Casodex and Trelstar.  2.  Castration resistant prostate cancer.  Patient was started on now ZYTIGA plus minus XTANDI in June of 2016       CHIEF COMPLAINT:  Chief Complaint  Patient presents with  . 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 Patient was on androgen deprivation therapy as well as Casodex.  Rising tumor marker.  Bone scan as well as CT scan was positive for extensive metastases Patient is off Casodex approximately 4 weeks ago Patient is on Alliance protocol HPI:   Patient is here for further follow-up regarding castration resistant prostate cancer   Oncology Flowsheet 11/25/2014 12/23/2014 01/20/2015 02/17/2015  denosumab (XGEVA) [place] - - - 120 mg  Zoledronic Acid (ZOMETA) IV 4 mg 4 mg 4 mg -    INTERVAL HISTORY:  Patient is on [facility] protocol receiving ZYTIGA plus minus XTANDI.  Abdominal distention and abdominal pain is improved.  No nausea no vomiting.  Appetite is improving.  No bony pains Patient had a CT scan of abdomen and chest and a bone scan done which has been independently reviewed.  PSA done today is pending. Bone scan and CT scan does not reveal any evidence of progressive disease or new lesions. Patient started having left costophrenic angle pain.  Started on Friday came acutely.  Patient had a  previous history of kidney stones.  No chills.  No fever.  No hematuria.  Pain is constant dull aching taking frequent Tylenol to control pain Appetite has decreased since some pain started patient also has feelings nausea.  No vomiting.  No constipation. REVIEW OF SYSTEMS:   GENERAL:  Feels good.  Active.  No fevers, sweats or weight loss. PERFORMANCE STATUS (ECOG): 0 HEENT:  No visual changes, runny nose, sore throat, mouth sores or tenderness. Lungs: No shortness of breath or cough.  No hemoptysis. Cardiac:  No chest pain, palpitations, orthopnea, or PND. GI:  A cute onset of left flank pain started on Friday as described in history of present illness had one episode of vomiting.  Had nausea.  Has poor appetite. GU:  No urgency, frequency, dysuria, or hematuria.  Urinalysis revealed microscopic hematuria Musculoskeletal:  No back pain.  No joint pain.  No muscle tenderness. Extremities:  No pain or swelling. Skin:  No rashes or skin changes. Neuro:  No headache, numbness or weakness, balance or coordination issues. Endocrine:  No diabetes, thyroid issues, . Patient has hot flashes .Psych:  No mood changes, depression or anxiety.  Pain:  No focal pain. Review of systems:  All other systems reviewed and found to be negative. As per HPI. Otherwise, a complete review of systems is negatve.  PAST MEDICAL HISTORY: Past Medical History  Diagnosis Date  . Prostate cancer   . Cancer of prostate 11/18/2014    Significant  History/PMH:   Prostate Cancer:   Preventive Screening:  Has patient had any of the following test? Colonscopy  Prostate Exam (1)   Last Colonoscopy: October 2013(1)   Last Prostate Exam: 2015(1)   Smoking History: Smoking History Never Smoked.(1)  PFSH: Comments: no significant family history of any malignancy.  Social History: negative alcohol, negative tobacco  Additional Past Medical and Surgical History: no significant history of hypertension or diabetes or  coronary artery disease   ADVANCED DIRECTIVES:  Patient does have advance healthcare directive, Patient   does not desire to make any changes HEALTH MAINTENANCE: Social History  Substance Use Topics  . Smoking status: Never Smoker   . Smokeless tobacco: None  . Alcohol Use: No      Allergies  Allergen Reactions  . No Known Allergies     Current Outpatient Prescriptions  Medication Sig Dispense Refill  . abiraterone Acetate (ZYTIGA) 250 MG tablet Take 4 tablets (1,000 mg total) by mouth daily. Take on an empty stomach 1 hour before or 2 hours after a meal 120 tablet 0  . Investigational enzalutamide 40 MG capsule [facility]ANCE [id number] Take 4 capsules (160 mg total) by mouth daily. Take with or without food.Swallow whole. Do not crush, or open capsules. 120 capsule 0  . predniSONE (DELTASONE) 5 MG tablet Take 1 tablet (5 mg total) by mouth 2 (two) times daily with a meal. 60 tablet 6  . Triptorelin Pamoate (TRELSTAR) 22.5 MG injection Inject 22.5 mg into the muscle every 6 (six) months.    Marland Kitchen abiraterone Acetate (ZYTIGA) 250 MG tablet Take 4 tablets (1,000 mg total) by mouth daily. Take on an empty stomach 1 hour before or 2 hours after a meal (Patient not taking: Reported on 03/16/2015) 120 tablet 0  . Investigational enzalutamide 40 MG capsule [facility]IANCE [id number] Take 4 capsules (160 mg total) by mouth daily. Take with or without food.Swallow whole. Do not crush, or open capsules. (Patient not taking: Reported on 03/16/2015) 120 capsule 0  . Investigational enzalutamide 40 MG capsule [facility]ANCE [id number] Take 4 capsules (160 mg total) by mouth daily. Take with or without food.Swallow whole. Do not crush, or open capsules. (Patient not taking: Reported on 03/16/2015) 120 capsule 0  . OxyCODONE HCl, Abuse Deter, (OXAYDO) 5 MG TABA Take 1 tablet by mouth every 4 (four) hours as needed. 30 tablet 0  . predniSONE (DELTASONE) 5 MG tablet Take 1 tablet (5 mg total) by mouth 2 (two) times daily. (Patient not  taking: Reported on 03/16/2015) 60 tablet 0  . predniSONE (DELTASONE) 5 MG tablet Take 1 tablet (5 mg total) by mouth 2 (two) times daily. (Patient not taking: Reported on 03/16/2015) 60 tablet 0   No current facility-administered medications for this visit.    OBJECTIVE:  Filed Vitals:   03/16/15 0957  BP: 122/81  Pulse: 74  Temp: 96.3 F (35.7 C)     Body mass index is 23.76 kg/(m^2).    ECOG FS:0 - Asymptomatic  PHYSICAL EXAM: GENERAL:  Well developed, well nourished, sitting comfortably in the exam room in no acute distress. MENTAL STATUS:  Alert and oriented to person, place and time. HEAD:   hair.  Normocephalic, atraumatic, face symmetric, no Cushingoid features. EYES:    Pupils equal round and reactive to light and accomodation.  No conjunctivitis or scleral icterus. ENT:  Oropharynx clear without lesion.  Tongue normal. Mucous membranes moist.  RESPIRATORY:  Clear to auscultation without rales, wheezes or rhonchi. CARDIOVASCULAR:  Regular rate and  rhythm without murmur, rub or gallop. . ABDOMEN:  Soft, non-tender, with active bowel sounds, and no hepatosplenomegaly.  No masses. Tenderness in the left costophrenic angle BACK  No tenderness on percussion of the back or rib cage. SKIN:  No rashes, ulcers or lesions. EXTREMITIES: No edema, no skin discoloration or tenderness.  No palpable cords. LYMPH NODES: No palpable cervical, supraclavicular, axillary or inguinal adenopathy  NEUROLOGICAL: Unremarkable. PSYCH:  Appropriate.   LAB RESULTS:  Appointment on 03/16/2015  Component Date Value Ref Range Status  . WBC 03/16/2015 7.1  3.8 - 10.6 K/uL Final  . RBC 03/16/2015 4.18* 4.40 - 5.90 MIL/uL Final  . Hemoglobin 03/16/2015 13.7  13.0 - 18.0 g/dL Final  . HCT 03/16/2015 40.0  40.0 - 52.0 % Final  . MCV 03/16/2015 95.8  80.0 - 100.0 fL Final  . MCH 03/16/2015 32.8  26.0 - 34.0 pg Final  . MCHC 03/16/2015 34.3  32.0 - 36.0 g/dL Final  . RDW 03/16/2015 13.7  11.5 - 14.5 %  Final  . Platelets 03/16/2015 187  150 - 440 K/uL Final  . Neutrophils Relative % 03/16/2015 71   Final  . Neutro Abs 03/16/2015 5.1  1.4 - 6.5 K/uL Final  . Lymphocytes Relative 03/16/2015 14   Final  . Lymphs Abs 03/16/2015 1.0  1.0 - 3.6 K/uL Final  . Monocytes Relative 03/16/2015 13   Final  . Monocytes Absolute 03/16/2015 0.9  0.2 - 1.0 K/uL Final  . Eosinophils Relative 03/16/2015 1   Final  . Eosinophils Absolute 03/16/2015 0.0  0 - 0.7 K/uL Final  . Basophils Relative 03/16/2015 1   Final  . Basophils Absolute 03/16/2015 0.0  0 - 0.1 K/uL Final  . Sodium 03/16/2015 139  135 - 145 mmol/L Final  . Potassium 03/16/2015 3.7  3.5 - 5.1 mmol/L Final  . Chloride 03/16/2015 104  101 - 111 mmol/L Final  . CO2 03/16/2015 26  22 - 32 mmol/L Final  . Glucose, Bld 03/16/2015 96  65 - 99 mg/dL Final  . BUN 03/16/2015 25* 6 - 20 mg/dL Final  . Creatinine, Ser 03/16/2015 1.52* 0.61 - 1.24 mg/dL Final  . Calcium 03/16/2015 8.6* 8.9 - 10.3 mg/dL Final  . Total Protein 03/16/2015 7.6  6.5 - 8.1 g/dL Final  . Albumin 03/16/2015 4.0  3.5 - 5.0 g/dL Final  . AST 03/16/2015 14* 15 - 41 U/L Final  . ALT 03/16/2015 10* 17 - 63 U/L Final  . Alkaline Phosphatase 03/16/2015 67  38 - 126 U/L Final  . Total Bilirubin 03/16/2015 1.1  0.3 - 1.2 mg/dL Final  . GFR calc non Af Amer 03/16/2015 46* >60 mL/min Final  . GFR calc Af Amer 03/16/2015 54* >60 mL/min Final   Comment: (NOTE) The eGFR has been calculated using the CKD EPI equation. This calculation has not been validated in all clinical situations. eGFR's persistently <60 mL/min signify possible Chronic Kidney Disease.   . Anion gap 03/16/2015 9  5 - 15 Final  . Color, Urine 03/16/2015 AMBER* YELLOW Final  . APPearance 03/16/2015 CLOUDY* CLEAR Final  . Glucose, UA 03/16/2015 NEGATIVE  NEGATIVE mg/dL Final  . Bilirubin Urine 03/16/2015 NEGATIVE  NEGATIVE Final  . Ketones, ur 03/16/2015 TRACE* NEGATIVE mg/dL Final  . Specific Gravity, Urine  03/16/2015 1.036* 1.005 - 1.030 Final  . Hgb urine dipstick 03/16/2015 3+* NEGATIVE Final  . pH 03/16/2015 5.0  5.0 - 8.0 Final  . Protein, ur 03/16/2015 100* NEGATIVE mg/dL Final  .  Nitrite 03/16/2015 NEGATIVE  NEGATIVE Final  . Leukocytes, UA 03/16/2015 2+* NEGATIVE Final  . RBC / HPF 03/16/2015 TOO NUMEROUS TO COUNT  0 - 5 RBC/hpf Final  . WBC, UA 03/16/2015 TOO NUMEROUS TO COUNT  0 - 5 WBC/hpf Final  . Bacteria, UA 03/16/2015 RARE* NONE SEEN Final  . Squamous Epithelial / LPF 03/16/2015 0-5* NONE SEEN Final  . WBC Clumps 03/16/2015 PRESENT   Final  . Mucous 03/16/2015 PRESENT   Final  . Hyaline Casts, UA 03/16/2015 PRESENT   Final  . Amorphous Crystal 03/16/2015 PRESENT   Final      STUDIES: Ct Chest W Contrast  03/10/2015   CLINICAL DATA:  Subsequent encounter for prostate cancer  EXAM: CT CHEST, ABDOMEN, AND PELVIS WITH CONTRAST  TECHNIQUE: Multidetector CT imaging of the chest, abdomen and pelvis was performed following the standard protocol during bolus administration of intravenous contrast.  CONTRAST:  183m OMNIPAQUE IOHEXOL 300 MG/ML  SOLN  COMPARISON:  01/15/2015.  FINDINGS: RECIST 1.1  Target Lesions:  1. Right-sided retrocrural lymph node (image 51 series 2) measures 2.8 x 0.9 cm today compared to 2.7 x 1.2 cm previously. 2. Left para-aortic lymph node on image 67 measures 2.7 x 1.2 cm today compared to 2.7 x 1.3 cm previously. 3. Previously identified lymph node superior to the pancreas (celiac axis) on image 60 series 2 today measures 2.4 x 1.4 cm compared to 2.4 x 1.5 cm previously. 4. The portacaval lymph node seen on image 62 today measures 1.6 x 1.1 cm compared to 1.6 x 1.1 cm previously. As on the previous study, at numerous sclerotic bone lesions are evident, but these are stable in the interval, specifically, reviewing the L2 and T12 vertebral bodies, no evidence for interval progression of these bony lesions. No new bony lesions are evident.  CT CHEST FINDINGS   Mediastinum/Nodes: There is no axillary lymphadenopathy. No mediastinal lymphadenopathy. There is no hilar lymphadenopathy. Maximum diameter ascending thoracic aorta stable at 4.4 cm Coronary artery calcification is noted. No evidence of pericardial effusion.  Lungs/Pleura: Linear scarring again seen in the lower lobes bilaterally. Stable scarring in the right middle lobe.  Musculoskeletal: Numerous sclerotic bone lesions again noted.  CT ABDOMEN AND PELVIS FINDINGS  Hepatobiliary: No focal abnormality within the liver parenchyma. There is no evidence for gallstones, gallbladder wall thickening, or pericholecystic fluid. No intrahepatic or extrahepatic biliary dilation.  Pancreas: No focal mass lesion. No dilatation of the main duct. No intraparenchymal cyst. No peripancreatic edema.  Spleen: No splenomegaly. No focal mass lesion.  Adrenals/Urinary Tract: No adrenal nodule or mass. Tiny bilateral nonobstructing renal stones again identified. No evidence for hydroureter. Stable appearance of mild circumferential bladder wall thickening.  Stomach/Bowel: Stomach is nondistended. Apparent wall thickening in the cardiac region of the stomach is stable and likely related to mucosal redundancy. Duodenum is normally positioned as is the ligament of Treitz. No small bowel wall thickening. No small bowel dilatation. The terminal ileum is normal. The appendix is normal. Diverticular changes are noted in the left colon without evidence of diverticulitis.  Vascular/Lymphatic: There is abdominal aortic atherosclerosis without aneurysm. Retroperitoneal lymphadenopathy again noted without substantial interval change (see RECIST paragraph above). The new cluster of lymph nodes seen previously by the right adrenal gland are stable in the interval.  Reproductive: The prostate gland and seminal vesicles have normal imaging features.  Other: No intraperitoneal free fluid.  Musculoskeletal: Multiple sclerotic bone lesions again noted  without substantial change.  IMPRESSION: 1. Stable  exam.  No new or progressive findings. 2. 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 Recommend annual imaging followup by CTA or MRA. This recommendation follows 2010 ACCF/AHA/AATS/ACR/ASA/SCA/SCAI/SIR/STS/SVM Guidelines for the Diagnosis and Management of Patients with Thoracic Aortic Disease. Circulation. 2010; 121: O756-E332   Electronically Signed   By: Misty Stanley M.D.   On: 03/10/2015 10:08   Nm Bone Scan Whole Body  03/15/2015   CLINICAL DATA:  Prostate cancer skeletal metastasis. Improving bone pain. [facility] [phone] study, PCWG2 criteria  EXAM: NUCLEAR MEDICINE WHOLE BODY BONE SCAN  TECHNIQUE: Whole body anterior and posterior images were obtained approximately 3 hours after intravenous injection of radiopharmaceutical.  RADIOPHARMACEUTICALS:  22.3 mCi Technetium-32mMDP IV  COMPARISON:  Bone scan 01/19/2015, baseline 10/21/2014  PCWG2 Measurements for Bone Metastasis:  1. No new lesions compared to baseline on exam of 10/21/2014. 2. Multiple skeletal lesions in the axillary skeleton are unchanged.  FINDINGS: Again demonstrated multiple skeletal metastasis within the axillary skeleton. Lesions include RIGHT calvarium, RIGHT scapula, and LEFT scapula. Multiple lesions in the lower thoracic spine. Lumbar spine. Bilateral rib lesions. Small lesions within the iliac bones, sacrum, and LEFT ischium. No lesions the femurs.  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   Electronically Signed   By: SSuzy BouchardM.D.   On: 03/15/2015 08:16   Ct Abdomen Pelvis W Contrast  03/10/2015   CLINICAL DATA:  Subsequent encounter for prostate cancer  EXAM: CT CHEST, ABDOMEN, AND PELVIS WITH CONTRAST  TECHNIQUE: Multidetector CT imaging of the chest, abdomen and pelvis was performed following the standard protocol during bolus administration of intravenous contrast.  CONTRAST:   1014mOMNIPAQUE IOHEXOL 300 MG/ML  SOLN  COMPARISON:  01/15/2015.  FINDINGS: RECIST 1.1  Target Lesions:  1. Right-sided retrocrural lymph node (image 51 series 2) measures 2.8 x 0.9 cm today compared to 2.7 x 1.2 cm previously. 2. Left para-aortic lymph node on image 67 measures 2.7 x 1.2 cm today compared to 2.7 x 1.3 cm previously. 3. Previously identified lymph node superior to the pancreas (celiac axis) on image 60 series 2 today measures 2.4 x 1.4 cm compared to 2.4 x 1.5 cm previously. 4. The portacaval lymph node seen on image 62 today measures 1.6 x 1.1 cm compared to 1.6 x 1.1 cm previously. As on the previous study, at numerous sclerotic bone lesions are evident, but these are stable in the interval, specifically, reviewing the L2 and T12 vertebral bodies, no evidence for interval progression of these bony lesions. No new bony lesions are evident.  CT CHEST FINDINGS  Mediastinum/Nodes: There is no axillary lymphadenopathy. No mediastinal lymphadenopathy. There is no hilar lymphadenopathy. Maximum diameter ascending thoracic aorta stable at 4.4 cm Coronary artery calcification is noted. No evidence of pericardial effusion.  Lungs/Pleura: Linear scarring again seen in the lower lobes bilaterally. Stable scarring in the right middle lobe.  Musculoskeletal: Numerous sclerotic bone lesions again noted.  CT ABDOMEN AND PELVIS FINDINGS  Hepatobiliary: No focal abnormality within the liver parenchyma. There is no evidence for gallstones, gallbladder wall thickening, or pericholecystic fluid. No intrahepatic or extrahepatic biliary dilation.  Pancreas: No focal mass lesion. No dilatation of the main duct. No intraparenchymal cyst. No peripancreatic edema.  Spleen: No splenomegaly. No focal mass lesion.  Adrenals/Urinary Tract: No adrenal nodule or mass. Tiny bilateral nonobstructing renal stones again identified. No evidence for hydroureter. Stable appearance of mild circumferential bladder wall thickening.   Stomach/Bowel: Stomach is  nondistended. Apparent wall thickening in the cardiac region of the stomach is stable and likely related to mucosal redundancy. Duodenum is normally positioned as is the ligament of Treitz. No small bowel wall thickening. No small bowel dilatation. The terminal ileum is normal. The appendix is normal. Diverticular changes are noted in the left colon without evidence of diverticulitis.  Vascular/Lymphatic: There is abdominal aortic atherosclerosis without aneurysm. Retroperitoneal lymphadenopathy again noted without substantial interval change (see RECIST paragraph above). The new cluster of lymph nodes seen previously by the right adrenal gland are stable in the interval.  Reproductive: The prostate gland and seminal vesicles have normal imaging features.  Other: No intraperitoneal free fluid.  Musculoskeletal: Multiple sclerotic bone lesions again noted without substantial change.  IMPRESSION: 1. Stable exam.  No new or progressive findings. 2. 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 Recommend annual imaging followup by CTA or MRA. This recommendation follows 2010 ACCF/AHA/AATS/ACR/ASA/SCA/SCAI/SIR/STS/SVM Guidelines for the Diagnosis and Management of Patients with Thoracic Aortic Disease. Circulation. 2010; 121: [id number]   Electronically Signed   By: Misty Stanley M.D.   On: 03/10/2015 10:08    ASSESSMENT: Castration resistant prostate cancer Patient is on [facility] protocol. At left flank pain acute onset most likely due to kidney infection versus history of kidney stone.  Urinalysis is abnormal. There is clumps of WBC as well as RBC suggesting either kidney stone with inflammation or infection. Patient was started on oxycodone for pain Will start patient on Cipro 500 mg every 12 for 7 days Awaiting culture. Skin there is no improvement patient will be evaluated for kidney stone and will be referred back to urology for  evaluation MEDICAL DECISION MAKING:  CT scan has been reviewed independently. Patient had vomiting and dehydration and anorexia and fatigue grade 1 most likely related to kidney infection versus kidney stones. Pain in the left flank area most likely related to kidney infection.  I will kidney stone  Grade 2  in Sonya grade 1 on related Hot flashes most likely related to androgen deprivation therapy or ZYTIGA grade 1  CT scan and bone scan has been reviewed independently there is no evidence of progressing disease PSA is pending Patient expressed understanding and was in agreement with this plan. He also understands that He can call clinic at any time with any questions, concerns, or complaints.    Cancer of prostate   Staging form: Prostate, AJCC 7th Edition     Clinical: Stage IV (T2, M1) - Signed by Evlyn Kanner, NP on 01/07/2015   Forest Gleason, MD   03/16/2015 1:41 PM

## 2015-03-16 NOTE — Telephone Encounter (Signed)
Rx has been called into pharmacy  

## 2015-03-16 NOTE — Telephone Encounter (Signed)
-----   Message from Forest Gleason, MD sent at 03/16/2015  1:46 PM EDT ----- Regarding: urinary tract infection Start patient on Cipro 500 mg every 12 hourly for 7 days.  Await culture report.  Possibility of kidney stone versus kidney infection as patient has clumps of WBC and also a microscopy hematuria

## 2015-03-16 NOTE — Progress Notes (Signed)
Patient does not have living will.  Never smoked.  Patient complains of not feeling well since last Friday.  States his back has been hurting on left side.  Though he had a kidney stone.. No appetite.  Not sleeping.  Also has had N/V.

## 2015-03-16 NOTE — Telephone Encounter (Signed)
Called patient to inform him that MD has escribed an antibiotic to his pharmacy.  He should pick it up and get started today if possible.  Patient verbalized understanding.

## 2015-03-16 NOTE — Progress Notes (Signed)
This encounter was opened in error.

## 2015-03-17 ENCOUNTER — Inpatient Hospital Stay: Payer: Medicare Other

## 2015-03-17 ENCOUNTER — Ambulatory Visit: Payer: Medicare Other

## 2015-03-17 ENCOUNTER — Inpatient Hospital Stay: Payer: Medicare Other | Admitting: Oncology

## 2015-03-17 ENCOUNTER — Other Ambulatory Visit: Payer: Medicare Other

## 2015-03-17 ENCOUNTER — Ambulatory Visit: Payer: Medicare Other | Admitting: Oncology

## 2015-03-18 ENCOUNTER — Telehealth: Payer: Self-pay | Admitting: *Deleted

## 2015-03-18 LAB — URINE CULTURE: Culture: 3000

## 2015-03-18 NOTE — Telephone Encounter (Signed)
03/18/15 @10 :08am T/C made to patient to follow up his visit on Tuesday. States he feels much better today, but has really experienced a great deal of pain with his kidney stone. States Dr. Oliva Bustard had given him a prescription for Cipro, and this has really helped. Reports he is feeling almost completely back to normal now. Questions what his PSA level was and PSA result of 37.45 shared with Mr. Littlejohn. Spoke with Dr. Oliva Bustard regarding whether to inform patient of his rising PSA, and Dr. Oliva Bustard stated this was OK, but to also inform patient that his treatment decisions will be based on the CT & Bone scan results rather that the PSA. All of this information was relayed to the patient. Reminded Mr. Schwan that he has a follow up appt with Dr. Oliva Bustard next week on 03/25/15 at 2:30pm. Pt reports he needs an earlier appt because he has to pick up his grandchildren from school at 3:00pm. Inst that I will ask the scheduler to switch him to an earlier appt time.  KSS  03/18/15 @10 :45am T/C made back to patient to inform that his appt time has been changed to 10:15 in the morning next Thursday -  03/25/15. Mr. Geisen states this is a much better time for him and expresses gratitude that it could be changed for him. He questions whether he will need additional labs that day. Informed that he has no labs scheduled - just an appt to see Dr. Oliva Bustard followed by his Delton See injection. Informed tht I will call him when we get the final results of his urine culture, and that he should continue to take the Cipro as previously instructed.  KSS

## 2015-03-19 NOTE — Progress Notes (Signed)
[facility] [id number] Protocol: Cycle 5/Day1-Pt's appointment was changed from Wednesday 03/17/2015 to today as he called yesterday complaining of lower left-sided pain that he believes may be due to a known kidney stone.  He is alone at today's visit.  He reports that he had a very good day on Friday and felt energetic.  By later afternoon he developed sudden onset of lower left back pain that caused him to vomit X 1 (Grade 1, unrelated), and kept him awake all night. The pain was less severe on Saturday and Sunday with the use of acetaminophen 2-4 tablets about every 6 hours.  He was able to get some sleep those nights.  He is uncertain about the total quantity of acetaminophen that he has taken since the pain started.  His labs reveal some dehydration (grade 1, unrelated) but otherwise are within acceptable parameters. 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  He reports dark urine and admits he is not drinking enough fluids. His review of systems reveals ongoing hot flashes, left lower back pain (grade 2, unrelated), insomnia (grade 1, unrelated).  Of note the patient's bone scan showed no evidence of spinal fracture and the patient's disease is stable.  The  patient's PSA level is pending. Dr. Oliva Bustard ordered Oxycodone 5 mg, U/A to r/o UTI, a return appointment to clinic in 1 week, and the patient is to increase his fluid intake.  Next scheduled research appointment scheduled for 04/14/2015, Cycle 6/Day1.    Adverse events:  Vomiting X 1-Grade 1, unrelated Dehydration-Grade 1, unrelated Anorexia-Grade 1, unrelated Fatigue-Grade 1, unrelated Left lower back pain- Grade 2, unrelated Insomnia-Grade 1, unrelated Hot flashes, ongoing-Grade 1, unlikely  Jake Samples, RN

## 2015-03-25 ENCOUNTER — Inpatient Hospital Stay: Payer: Medicare Other | Attending: Oncology | Admitting: Oncology

## 2015-03-25 ENCOUNTER — Ambulatory Visit: Payer: Medicare Other | Admitting: Oncology

## 2015-03-25 ENCOUNTER — Encounter: Payer: Self-pay | Admitting: *Deleted

## 2015-03-25 ENCOUNTER — Inpatient Hospital Stay: Payer: Medicare Other

## 2015-03-25 ENCOUNTER — Encounter: Payer: Self-pay | Admitting: Oncology

## 2015-03-25 ENCOUNTER — Ambulatory Visit: Payer: Medicare Other

## 2015-03-25 VITALS — BP 146/85 | HR 79 | Temp 96.9°F | Wt 167.4 lb

## 2015-03-25 DIAGNOSIS — R319 Hematuria, unspecified: Secondary | ICD-10-CM | POA: Diagnosis not present

## 2015-03-25 DIAGNOSIS — R590 Localized enlarged lymph nodes: Secondary | ICD-10-CM | POA: Diagnosis not present

## 2015-03-25 DIAGNOSIS — Z7952 Long term (current) use of systemic steroids: Secondary | ICD-10-CM | POA: Insufficient documentation

## 2015-03-25 DIAGNOSIS — Z006 Encounter for examination for normal comparison and control in clinical research program: Secondary | ICD-10-CM

## 2015-03-25 DIAGNOSIS — Z79899 Other long term (current) drug therapy: Secondary | ICD-10-CM

## 2015-03-25 DIAGNOSIS — C61 Malignant neoplasm of prostate: Secondary | ICD-10-CM

## 2015-03-25 DIAGNOSIS — R0981 Nasal congestion: Secondary | ICD-10-CM | POA: Diagnosis not present

## 2015-03-25 DIAGNOSIS — Z79818 Long term (current) use of other agents affecting estrogen receptors and estrogen levels: Secondary | ICD-10-CM | POA: Diagnosis not present

## 2015-03-25 DIAGNOSIS — C7951 Secondary malignant neoplasm of bone: Secondary | ICD-10-CM

## 2015-03-25 MED ORDER — DENOSUMAB 120 MG/1.7ML ~~LOC~~ SOLN
120.0000 mg | Freq: Once | SUBCUTANEOUS | Status: AC
  Administered 2015-03-25: 120 mg via SUBCUTANEOUS
  Filled 2015-03-25: qty 1.7

## 2015-03-25 NOTE — Progress Notes (Signed)
[place] @ [facility] [phone]  Fax:([phone]) [place]: 25-May-1951  MR#: [id number]  VQX#:[id number]  Patient Care Team: No Pcp Per Patient as PCP - General (General Practice)  CHIEF COMPLAINT:  Chief Complaint  Patient presents with  . 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 Patient was on androgen deprivation therapy as well as Casodex.  Rising tumor marker.  Bone scan as well as CT scan was positive for extensive metastases Patient is off Casodex approximately 4 weeks ago Patient is on [facility] protocol HPI:   Patient is here for further follow-up regarding castration resistant prostate cancer\ March 25, 2015 Patient is here for ongoing evaluation and had a left flank pain urine was abnormal.  Patient was given Cipro.  Flank pain has completely resolved.  No chills.  No fever.  Patient is getting regular Trelstar every 6 month by a urologist. Getting XGEVA on a regular basis On [facility] protocol Repeat CT scan and bone scan has been reviewed stable disease PSA has been rising  Oncology History   carcinoma  of prostatestage IIc, and  T2 N0 M0 diagnosis in February of 2014 patient had PSA of 18 patient antiandrogentherapyand therapy.  According to him in May of 2015 patient had PSA of 90 was started on androgen deprivation therapy any dropped to 20.  And then 18 later on.  Now PSA is rising againto 26 documented on October 03 2014.  Patient's bone scan and CT revealed extensive metastasis Patient had received Casodex and Trelstar.  2.  Castration resistant prostate cancer.  Patient was started on now ZYTIGA plus minus XTANDI in June of 2016       Cancer of prostate   08/17/2012 Initial Diagnosis Cancer of prostate, stage II   12/03/2013 Progression    10/13/2014 Progression     Oncology Flowsheet 11/25/2014 12/23/2014 01/20/2015 02/17/2015  denosumab (XGEVA) [facility] - - - 120 mg  Zoledronic Acid (ZOMETA) IV 4 mg 4  mg 4 mg -    INTERVAL HISTORY:  Patient is on [facility] protocol receiving ZYTIGA plus minus XTANDI.  Abdominal distention and abdominal pain is improved.  No nausea no vomiting.  Appetite is improving.  No bony pains REVIEW OF SYSTEMS:   GENERAL:  Feels good.  Active.  No fevers, sweats or weight loss. PERFORMANCE STATUS (ECOG): 0 HEENT:  No visual changes, runny nose, sore throat, mouth sores or tenderness. Lungs: No shortness of breath or cough.  No hemoptysis. Cardiac:  No chest pain, palpitations, orthopnea, or PND. GI:  No nausea, vomiting, diarrhea, constipation, melena or hematochezia. GU:  No urgency, frequency, dysuria, or hematuria. Musculoskeletal:  No back pain.  No joint pain.  No muscle tenderness. Extremities:  No pain or swelling. Skin:  No rashes or skin changes. Neuro:  No headache, numbness or weakness, balance or coordination issues. Endocrine:  No diabetes, thyroid issues, hot flashes or night sweats. Psych:  No mood changes, depression or anxiety. Pain:  No focal pain. Review of systems:  All other systems reviewed and found to be negative. As per HPI. Otherwise, a complete review of systems is negatve.  PAST MEDICAL HISTORY: Past Medical History  Diagnosis Date  . Prostate cancer   . Cancer of prostate 11/18/2014    Significant History/PMH:   Prostate Cancer:   Preventive Screening:  Has patient had any of the following test? Colonscopy  Prostate Exam (1)   Last Colonoscopy: October 2013(1)  Last Prostate Exam: 2015(1)   Smoking History: Smoking History Never Smoked.(1)  PFSH: Comments: no significant family history of any malignancy.  Social History: negative alcohol, negative tobacco  Additional Past Medical and Surgical History: no significant history of hypertension or diabetes or coronary artery disease   ADVANCED DIRECTIVES:  Patient does have advance healthcare directive, Patient   does not desire to make any changes HEALTH  MAINTENANCE: Social History  Substance Use Topics  . Smoking status: Never Smoker   . Smokeless tobacco: None  . Alcohol Use: No      Allergies  Allergen Reactions  . No Known Allergies     Current Outpatient Prescriptions  Medication Sig Dispense Refill  . abiraterone Acetate (ZYTIGA) 250 MG tablet Take 4 tablets (1,000 mg total) by mouth daily. Take on an empty stomach 1 hour before or 2 hours after a meal 120 tablet 0  . abiraterone Acetate (ZYTIGA) 250 MG tablet Take 4 tablets (1,000 mg total) by mouth daily. Take on an empty stomach 1 hour before or 2 hours after a meal 120 tablet 0  . ciprofloxacin (CIPRO) 500 MG tablet Take 1 tablet (500 mg total) by mouth 2 (two) times daily. 14 tablet 0  . Investigational enzalutamide 40 MG capsule [facility] [id number] Take 4 capsules (160 mg total) by mouth daily. Take with or without food.Swallow whole. Do not crush, or open capsules. 120 capsule 0  . Investigational enzalutamide 40 MG capsule [facility] [id number] Take 4 capsules (160 mg total) by mouth daily. Take with or without food.Swallow whole. Do not crush, or open capsules. 120 capsule 0  . Investigational enzalutamide 40 MG capsule [facility] [id number] Take 4 capsules (160 mg total) by mouth daily. Take with or without food.Swallow whole. Do not crush, or open capsules. 120 capsule 0  . OxyCODONE HCl, Abuse Deter, (OXAYDO) 5 MG TABA Take 1 tablet by mouth every 4 (four) hours as needed. 30 tablet 0  . predniSONE (DELTASONE) 5 MG tablet Take 1 tablet (5 mg total) by mouth 2 (two) times daily with a meal. 60 tablet 6  . predniSONE (DELTASONE) 5 MG tablet Take 1 tablet (5 mg total) by mouth 2 (two) times daily. 60 tablet 0  . predniSONE (DELTASONE) 5 MG tablet Take 1 tablet (5 mg total) by mouth 2 (two) times daily. 60 tablet 0  . Triptorelin Pamoate (TRELSTAR) 22.5 MG injection Inject 22.5 mg into the muscle every 6 (six) months.     No current facility-administered medications for this visit.     OBJECTIVE:  Filed Vitals:   03/25/15 1109  BP: 146/85  Pulse: 79  Temp: 96.9 F (36.1 C)     Body mass index is 23.96 kg/(m^2).    ECOG FS:0 - Asymptomatic  PHYSICAL EXAM: GENERAL:  Well developed, well nourished, sitting comfortably in the exam room in no acute distress. MENTAL STATUS:  Alert and oriented to person, place and time. HEAD:   hair.  Normocephalic, atraumatic, face symmetric, no Cushingoid features. EYES:    Pupils equal round and reactive to light and accomodation.  No conjunctivitis or scleral icterus. ENT:  Oropharynx clear without lesion.  Tongue normal. Mucous membranes moist.  RESPIRATORY:  Clear to auscultation without rales, wheezes or rhonchi. CARDIOVASCULAR:  Regular rate and rhythm without murmur, rub or gallop. . ABDOMEN:  Soft, non-tender, with active bowel sounds, and no hepatosplenomegaly.  No masses. BACK:  No CVA tenderness.  No tenderness on percussion of the back or rib cage. SKIN:  No rashes, ulcers or lesions. EXTREMITIES: No edema, no skin discoloration or tenderness.  No palpable cords. LYMPH NODES: No palpable cervical, supraclavicular, axillary or inguinal adenopathy  NEUROLOGICAL: Unremarkable. PSYCH:  Appropriate.   LAB RESULTS:  No visits with results within 2 Day(s) from this visit. Latest known visit with results is:  Appointment on 03/16/2015  Component Date Value Ref Range Status  . WBC 03/16/2015 7.1  3.8 - 10.6 K/uL Final  . RBC 03/16/2015 4.18* 4.40 - 5.90 MIL/uL Final  . Hemoglobin 03/16/2015 13.7  13.0 - 18.0 g/dL Final  . HCT 03/16/2015 40.0  40.0 - 52.0 % Final  . MCV 03/16/2015 95.8  80.0 - 100.0 fL Final  . MCH 03/16/2015 32.8  26.0 - 34.0 pg Final  . MCHC 03/16/2015 34.3  32.0 - 36.0 g/dL Final  . RDW 03/16/2015 13.7  11.5 - 14.5 % Final  . Platelets 03/16/2015 187  150 - 440 K/uL Final  . Neutrophils Relative % 03/16/2015 71   Final  . Neutro Abs 03/16/2015 5.1  1.4 - 6.5 K/uL Final  . Lymphocytes Relative  03/16/2015 14   Final  . Lymphs Abs 03/16/2015 1.0  1.0 - 3.6 K/uL Final  . Monocytes Relative 03/16/2015 13   Final  . Monocytes Absolute 03/16/2015 0.9  0.2 - 1.0 K/uL Final  . Eosinophils Relative 03/16/2015 1   Final  . Eosinophils Absolute 03/16/2015 0.0  0 - 0.7 K/uL Final  . Basophils Relative 03/16/2015 1   Final  . Basophils Absolute 03/16/2015 0.0  0 - 0.1 K/uL Final  . Sodium 03/16/2015 139  135 - 145 mmol/L Final  . Potassium 03/16/2015 3.7  3.5 - 5.1 mmol/L Final  . Chloride 03/16/2015 104  101 - 111 mmol/L Final  . CO2 03/16/2015 26  22 - 32 mmol/L Final  . Glucose, Bld 03/16/2015 96  65 - 99 mg/dL Final  . BUN 03/16/2015 25* 6 - 20 mg/dL Final  . Creatinine, Ser 03/16/2015 1.52* 0.61 - 1.24 mg/dL Final  . Calcium 03/16/2015 8.6* 8.9 - 10.3 mg/dL Final  . Total Protein 03/16/2015 7.6  6.5 - 8.1 g/dL Final  . Albumin 03/16/2015 4.0  3.5 - 5.0 g/dL Final  . AST 03/16/2015 14* 15 - 41 U/L Final  . ALT 03/16/2015 10* 17 - 63 U/L Final  . Alkaline Phosphatase 03/16/2015 67  38 - 126 U/L Final  . Total Bilirubin 03/16/2015 1.1  0.3 - 1.2 mg/dL Final  . GFR calc non Af Amer 03/16/2015 46* >60 mL/min Final  . GFR calc Af Amer 03/16/2015 54* >60 mL/min Final   Comment: (NOTE) The eGFR has been calculated using the CKD EPI equation. This calculation has not been validated in all clinical situations. eGFR's persistently <60 mL/min signify possible Chronic Kidney Disease.   . Anion gap 03/16/2015 9  5 - 15 Final  . PSA 03/16/2015 37.45* 0.00 - 4.00 ng/mL Final   Comment: (NOTE) While PSA levels of <=4.0 ng/ml are reported as reference range, some men with levels below 4.0 ng/ml can have prostate cancer and many men with PSA above 4.0 ng/ml do not have prostate cancer.  Other tests such as free PSA, age specific reference ranges, PSA velocity and PSA doubling time may be helpful especially in men less than 36 years old. Performed at [facility]   . Specimen  Description 03/16/2015 URINE, CLEAN CATCH   Final  . Special Requests 03/16/2015 URINE   Final  . Culture 03/16/2015  3,000 COLONIES/mL INSIGNIFICANT GROWTH   Final  . Report Status 03/16/2015 03/18/2015 FINAL   Final  . Color, Urine 03/16/2015 AMBER* YELLOW Final  . APPearance 03/16/2015 CLOUDY* CLEAR Final  . Glucose, UA 03/16/2015 NEGATIVE  NEGATIVE mg/dL Final  . Bilirubin Urine 03/16/2015 NEGATIVE  NEGATIVE Final  . Ketones, ur 03/16/2015 TRACE* NEGATIVE mg/dL Final  . Specific Gravity, Urine 03/16/2015 1.036* 1.005 - 1.030 Final  . Hgb urine dipstick 03/16/2015 3+* NEGATIVE Final  . pH 03/16/2015 5.0  5.0 - 8.0 Final  . Protein, ur 03/16/2015 100* NEGATIVE mg/dL Final  . Nitrite 03/16/2015 NEGATIVE  NEGATIVE Final  . Leukocytes, UA 03/16/2015 2+* NEGATIVE Final  . RBC / HPF 03/16/2015 TOO NUMEROUS TO COUNT  0 - 5 RBC/hpf Final  . WBC, UA 03/16/2015 TOO NUMEROUS TO COUNT  0 - 5 WBC/hpf Final  . Bacteria, UA 03/16/2015 RARE* NONE SEEN Final  . Squamous Epithelial / LPF 03/16/2015 0-5* NONE SEEN Final  . WBC Clumps 03/16/2015 PRESENT   Final  . Mucous 03/16/2015 PRESENT   Final  . Hyaline Casts, UA 03/16/2015 PRESENT   Final  . Amorphous Crystal 03/16/2015 PRESENT   Final      STUDIES: Ct Chest W Contrast  03/10/2015   CLINICAL DATA:  Subsequent encounter for prostate cancer  EXAM: CT CHEST, ABDOMEN, AND PELVIS WITH CONTRAST  TECHNIQUE: Multidetector CT imaging of the chest, abdomen and pelvis was performed following the standard protocol during bolus administration of intravenous contrast.  CONTRAST:  195m OMNIPAQUE IOHEXOL 300 MG/ML  SOLN  COMPARISON:  01/15/2015.  FINDINGS: RECIST 1.1  Target Lesions:  1. Right-sided retrocrural lymph node (image 51 series 2) measures 2.8 x 0.9 cm today compared to 2.7 x 1.2 cm previously. 2. Left para-aortic lymph node on image 67 measures 2.7 x 1.2 cm today compared to 2.7 x 1.3 cm previously. 3. Previously identified lymph node superior to the  pancreas (celiac axis) on image 60 series 2 today measures 2.4 x 1.4 cm compared to 2.4 x 1.5 cm previously. 4. The portacaval lymph node seen on image 62 today measures 1.6 x 1.1 cm compared to 1.6 x 1.1 cm previously. As on the previous study, at numerous sclerotic bone lesions are evident, but these are stable in the interval, specifically, reviewing the L2 and T12 vertebral bodies, no evidence for interval progression of these bony lesions. No new bony lesions are evident.  CT CHEST FINDINGS  Mediastinum/Nodes: There is no axillary lymphadenopathy. No mediastinal lymphadenopathy. There is no hilar lymphadenopathy. Maximum diameter ascending thoracic aorta stable at 4.4 cm Coronary artery calcification is noted. No evidence of pericardial effusion.  Lungs/Pleura: Linear scarring again seen in the lower lobes bilaterally. Stable scarring in the right middle lobe.  Musculoskeletal: Numerous sclerotic bone lesions again noted.  CT ABDOMEN AND PELVIS FINDINGS  Hepatobiliary: No focal abnormality within the liver parenchyma. There is no evidence for gallstones, gallbladder wall thickening, or pericholecystic fluid. No intrahepatic or extrahepatic biliary dilation.  Pancreas: No focal mass lesion. No dilatation of the main duct. No intraparenchymal cyst. No peripancreatic edema.  Spleen: No splenomegaly. No focal mass lesion.  Adrenals/Urinary Tract: No adrenal nodule or mass. Tiny bilateral nonobstructing renal stones again identified. No evidence for hydroureter. Stable appearance of mild circumferential bladder wall thickening.  Stomach/Bowel: Stomach is nondistended. Apparent wall thickening in the cardiac region of the stomach is stable and likely related to mucosal redundancy. Duodenum is normally positioned as is the ligament of Treitz. No small  bowel wall thickening. No small bowel dilatation. The terminal ileum is normal. The appendix is normal. Diverticular changes are noted in the left colon without evidence  of diverticulitis.  Vascular/Lymphatic: There is abdominal aortic atherosclerosis without aneurysm. Retroperitoneal lymphadenopathy again noted without substantial interval change (see RECIST paragraph above). The new cluster of lymph nodes seen previously by the right adrenal gland are stable in the interval.  Reproductive: The prostate gland and seminal vesicles have normal imaging features.  Other: No intraperitoneal free fluid.  Musculoskeletal: Multiple sclerotic bone lesions again noted without substantial change.  IMPRESSION: 1. Stable exam.  No new or progressive findings. 2. 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 Recommend annual imaging followup by CTA or MRA. This recommendation follows 2010 ACCF/AHA/AATS/ACR/ASA/SCA/SCAI/SIR/STS/SVM Guidelines for the Diagnosis and Management of Patients with Thoracic Aortic Disease. Circulation. 2010; 121: W263-Z858   Electronically Signed   By: Misty Stanley M.D.   On: 03/10/2015 10:08   Nm Bone Scan Whole Body  03/15/2015   CLINICAL DATA:  Prostate cancer skeletal metastasis. Improving bone pain. [facility] [phone] study, PCWG2 criteria  EXAM: NUCLEAR MEDICINE WHOLE BODY BONE SCAN  TECHNIQUE: Whole body anterior and posterior images were obtained approximately 3 hours after intravenous injection of radiopharmaceutical.  RADIOPHARMACEUTICALS:  22.3 mCi Technetium-56mMDP IV  COMPARISON:  Bone scan 01/19/2015, baseline 10/21/2014  PCWG2 Measurements for Bone Metastasis:  1. No new lesions compared to baseline on exam of 10/21/2014. 2. Multiple skeletal lesions in the axillary skeleton are unchanged.  FINDINGS: Again demonstrated multiple skeletal metastasis within the axillary skeleton. Lesions include RIGHT calvarium, RIGHT scapula, and LEFT scapula. Multiple lesions in the lower thoracic spine. Lumbar spine. Bilateral rib lesions. Small lesions within the iliac bones, sacrum, and LEFT ischium. No lesions the femurs.   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   Electronically Signed   By: SSuzy BouchardM.D.   On: 03/15/2015 08:16   Ct Abdomen Pelvis W Contrast  03/10/2015   CLINICAL DATA:  Subsequent encounter for prostate cancer  EXAM: CT CHEST, ABDOMEN, AND PELVIS WITH CONTRAST  TECHNIQUE: Multidetector CT imaging of the chest, abdomen and pelvis was performed following the standard protocol during bolus administration of intravenous contrast.  CONTRAST:  1050mOMNIPAQUE IOHEXOL 300 MG/ML  SOLN  COMPARISON:  01/15/2015.  FINDINGS: RECIST 1.1  Target Lesions:  1. Right-sided retrocrural lymph node (image 51 series 2) measures 2.8 x 0.9 cm today compared to 2.7 x 1.2 cm previously. 2. Left para-aortic lymph node on image 67 measures 2.7 x 1.2 cm today compared to 2.7 x 1.3 cm previously. 3. Previously identified lymph node superior to the pancreas (celiac axis) on image 60 series 2 today measures 2.4 x 1.4 cm compared to 2.4 x 1.5 cm previously. 4. The portacaval lymph node seen on image 62 today measures 1.6 x 1.1 cm compared to 1.6 x 1.1 cm previously. As on the previous study, at numerous sclerotic bone lesions are evident, but these are stable in the interval, specifically, reviewing the L2 and T12 vertebral bodies, no evidence for interval progression of these bony lesions. No new bony lesions are evident.  CT CHEST FINDINGS  Mediastinum/Nodes: There is no axillary lymphadenopathy. No mediastinal lymphadenopathy. There is no hilar lymphadenopathy. Maximum diameter ascending thoracic aorta stable at 4.4 cm Coronary artery calcification is noted. No evidence of pericardial effusion.  Lungs/Pleura: Linear scarring again seen in the lower lobes bilaterally. Stable scarring in the right  middle lobe.  Musculoskeletal: Numerous sclerotic bone lesions again noted.  CT ABDOMEN AND PELVIS FINDINGS  Hepatobiliary: No focal abnormality within the liver parenchyma. There is no evidence  for gallstones, gallbladder wall thickening, or pericholecystic fluid. No intrahepatic or extrahepatic biliary dilation.  Pancreas: No focal mass lesion. No dilatation of the main duct. No intraparenchymal cyst. No peripancreatic edema.  Spleen: No splenomegaly. No focal mass lesion.  Adrenals/Urinary Tract: No adrenal nodule or mass. Tiny bilateral nonobstructing renal stones again identified. No evidence for hydroureter. Stable appearance of mild circumferential bladder wall thickening.  Stomach/Bowel: Stomach is nondistended. Apparent wall thickening in the cardiac region of the stomach is stable and likely related to mucosal redundancy. Duodenum is normally positioned as is the ligament of Treitz. No small bowel wall thickening. No small bowel dilatation. The terminal ileum is normal. The appendix is normal. Diverticular changes are noted in the left colon without evidence of diverticulitis.  Vascular/Lymphatic: There is abdominal aortic atherosclerosis without aneurysm. Retroperitoneal lymphadenopathy again noted without substantial interval change (see RECIST paragraph above). The new cluster of lymph nodes seen previously by the right adrenal gland are stable in the interval.  Reproductive: The prostate gland and seminal vesicles have normal imaging features.  Other: No intraperitoneal free fluid.  Musculoskeletal: Multiple sclerotic bone lesions again noted without substantial change.  IMPRESSION: 1. Stable exam.  No new or progressive findings. 2. 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 Recommend annual imaging followup by CTA or MRA. This recommendation follows 2010 ACCF/AHA/AATS/ACR/ASA/SCA/SCAI/SIR/STS/SVM Guidelines for the Diagnosis and Management of Patients with Thoracic Aortic Disease. Circulation. 2010; 121: Z791-T056   Electronically Signed   By: Misty Stanley M.D.   On: 03/10/2015 10:08    ASSESSMENT: Castration resistant prostate cancer PSA  has been rising.  Review of CT scan and bone scan does not show any progressive disease. According to investigational protocol PSAs not use as a criteria alone to take patient off study Flank pain has resolved  MEDICAL DECISION MAKING:  CT scan has been reviewed independently.  There might be progressive disease with new additional lymph node.  Biopsy PSA criteria patient is responding to the treatment .Marland Kitchen  Bone scan and report is pending Would discuss this situation with protocol coordinator because we might have to take patient off protocol therapy.  Because of progressive disease We will continue Trelstar as well as xgeva  Trelstar being given by urologist  Patient expressed understanding and was in agreement with this plan. He also understands that He can call clinic at any time with any questions, concerns, or complaints.    Cancer of prostate   Staging form: Prostate, AJCC 7th Edition     Clinical: Stage IV (T2, M1) - Signed by Evlyn Kanner, NP on 01/07/2015   Forest Gleason, MD   03/25/2015 11:38 AM

## 2015-03-25 NOTE — Progress Notes (Signed)
Patient does not have living will.  Never smoked. 

## 2015-03-26 ENCOUNTER — Encounter: Payer: Self-pay | Admitting: *Deleted

## 2015-03-26 DIAGNOSIS — C61 Malignant neoplasm of prostate: Secondary | ICD-10-CM

## 2015-03-26 NOTE — Progress Notes (Signed)
Encounter opened erroneously. KSS 

## 2015-03-26 NOTE — Progress Notes (Signed)
03/25/15 @10 :55am Patient seen in clinic by Dr. Oliva Bustard to follow up his flank pain and urinary tract infection from last week. William Wright reports he feels fine today, and states he started feeling better within 24 hours of beginning the Cipro last Tuesday, and that by day 2, he was back to normal. Denies any further flank pain. Pt also returned the Enzalutamide pill bottle that he had forgotten to bring with him last week. Dr. Oliva Bustard was in to examine patient and also reviewed results of his CT, Bone scan and PSA. Informed patient that his recommendation is to continue with protocol treatment until disease progression is evident either by CT or bone scan and William Wright agrees. He will receive his Xgeva injection today as well.  KSS  03/26/15 Pill count for Enzalutamide per pharmacy reveals patient returned 24 capsules; which has been noted on his med calendar. KSS

## 2015-04-14 ENCOUNTER — Inpatient Hospital Stay: Payer: Medicare Other

## 2015-04-14 ENCOUNTER — Inpatient Hospital Stay (HOSPITAL_BASED_OUTPATIENT_CLINIC_OR_DEPARTMENT_OTHER): Payer: Medicare Other | Admitting: Oncology

## 2015-04-14 ENCOUNTER — Encounter: Payer: Self-pay | Admitting: Oncology

## 2015-04-14 ENCOUNTER — Encounter: Payer: Self-pay | Admitting: *Deleted

## 2015-04-14 VITALS — BP 148/77 | HR 64 | Temp 96.9°F | Wt 163.5 lb

## 2015-04-14 DIAGNOSIS — Z79818 Long term (current) use of other agents affecting estrogen receptors and estrogen levels: Secondary | ICD-10-CM | POA: Diagnosis not present

## 2015-04-14 DIAGNOSIS — Z7952 Long term (current) use of systemic steroids: Secondary | ICD-10-CM | POA: Diagnosis not present

## 2015-04-14 DIAGNOSIS — R319 Hematuria, unspecified: Secondary | ICD-10-CM

## 2015-04-14 DIAGNOSIS — Z006 Encounter for examination for normal comparison and control in clinical research program: Secondary | ICD-10-CM

## 2015-04-14 DIAGNOSIS — Z79899 Other long term (current) drug therapy: Secondary | ICD-10-CM | POA: Diagnosis not present

## 2015-04-14 DIAGNOSIS — C7951 Secondary malignant neoplasm of bone: Secondary | ICD-10-CM | POA: Diagnosis not present

## 2015-04-14 DIAGNOSIS — C61 Malignant neoplasm of prostate: Secondary | ICD-10-CM

## 2015-04-14 LAB — COMPREHENSIVE METABOLIC PANEL
ALK PHOS: 55 U/L (ref 38–126)
ALT: 10 U/L — AB (ref 17–63)
AST: 13 U/L — ABNORMAL LOW (ref 15–41)
Albumin: 4.2 g/dL (ref 3.5–5.0)
Anion gap: 6 (ref 5–15)
BILIRUBIN TOTAL: 0.9 mg/dL (ref 0.3–1.2)
BUN: 19 mg/dL (ref 6–20)
CALCIUM: 8.6 mg/dL — AB (ref 8.9–10.3)
CO2: 27 mmol/L (ref 22–32)
CREATININE: 0.98 mg/dL (ref 0.61–1.24)
Chloride: 105 mmol/L (ref 101–111)
Glucose, Bld: 103 mg/dL — ABNORMAL HIGH (ref 65–99)
Potassium: 3.9 mmol/L (ref 3.5–5.1)
Sodium: 138 mmol/L (ref 135–145)
Total Protein: 7.1 g/dL (ref 6.5–8.1)

## 2015-04-14 LAB — URINALYSIS COMPLETE WITH MICROSCOPIC (ARMC ONLY)
BILIRUBIN URINE: NEGATIVE
Glucose, UA: NEGATIVE mg/dL
KETONES UR: NEGATIVE mg/dL
NITRITE: NEGATIVE
PH: 6 (ref 5.0–8.0)
Protein, ur: 30 mg/dL — AB
SPECIFIC GRAVITY, URINE: 1.01 (ref 1.005–1.030)

## 2015-04-14 LAB — CBC WITH DIFFERENTIAL/PLATELET
Basophils Absolute: 0 10*3/uL (ref 0–0.1)
Basophils Relative: 1 %
Eosinophils Absolute: 0.1 10*3/uL (ref 0–0.7)
Eosinophils Relative: 2 %
HEMATOCRIT: 40.3 % (ref 40.0–52.0)
HEMOGLOBIN: 13.9 g/dL (ref 13.0–18.0)
LYMPHS ABS: 1.1 10*3/uL (ref 1.0–3.6)
LYMPHS PCT: 30 %
MCH: 33.3 pg (ref 26.0–34.0)
MCHC: 34.4 g/dL (ref 32.0–36.0)
MCV: 96.6 fL (ref 80.0–100.0)
Monocytes Absolute: 0.5 10*3/uL (ref 0.2–1.0)
Monocytes Relative: 14 %
NEUTROS PCT: 53 %
Neutro Abs: 2.1 10*3/uL (ref 1.4–6.5)
Platelets: 186 10*3/uL (ref 150–440)
RBC: 4.17 MIL/uL — AB (ref 4.40–5.90)
RDW: 13.6 % (ref 11.5–14.5)
WBC: 3.9 10*3/uL (ref 3.8–10.6)

## 2015-04-14 LAB — PSA: PSA: 20.91 ng/mL — AB (ref 0.00–4.00)

## 2015-04-14 MED ORDER — ABIRATERONE ACETATE 250 MG PO TABS: 1000.0000 mg | ORAL_TABLET | Freq: Every day | ORAL | Status: DC

## 2015-04-14 NOTE — Progress Notes (Signed)
04/15/2015 @11 :58am [name] returns to clinic this morning for consideration of Cycle 6 chemotherapy with Enzalutamide, Abiraterone and Prednisone. Reports he thinks his kidney stone is moving again because he has experienced hematuria since 04/11/15. He denies any other symptoms of UTI, but reports dark red tinged urine with some bright red streaks at time, then other times urine looks clear. Dr. Oliva Bustard has requested a urinalysis and urine culture for which a urine specimen was collected, but [name] states he thinks his kidney stone is moving again. AEs reviewed and patient states he has been doing well and still has the same issues with occasional fatigue and hot flashes - both unchanged since last visit. His B/P remains a little elevated; grade 1 over baseline and he reports mild hemorrhoids that are new and states he may use a hemorrhoid preparation to see if they resolve. [name] returned his medication calendars and pill bottles and pill count for Enzalutamide revealed he has only 4 capsules left due to longer cycle this time. He also brought his Abiraterone and states he received a new bottle from [facility] just today, and his Prednisone for which he has refills. Old Enzalutamide bottle returned to pharmacy, and patient was dispensed a new bottle of Enzalutamide 40mg  capsules; #120 and instructed to continue to take 4 caps every morning. He was also given new medication calendars with instructions on how and when to take each medication. CBC and chemistries are within acceptable parameters for patient to receive chemotherapy. VS are stable other than a slightly elevated B/P. Dr. Oliva Bustard in to examine patient and state OK to proceed with cycle 6 chemo. CT and bone scan scheduled to be performed just prior to next appointment. AEs with grade and attributions as follows:       Fatigue - grade 1; definitely related to study  drug Hot flashes - grade 1; unlikely related to study drug Hypertension - grade 1; probably related to study drug      Hematuria - grade 1; unrelated      Hemorrhoids - grade 1; unrelated

## 2015-04-14 NOTE — Progress Notes (Signed)
[place] @ [facility] [phone]  Fax:([phone]) [place]: Mar 01, 1951  MR#: [id number]  RKY#:[id number]  Patient Care Team: No Pcp Per Patient as PCP - General (General Practice)  CHIEF COMPLAINT:  No chief complaint on file. 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 Patient was on androgen deprivation therapy as well as Casodex.  Rising tumor marker.  Bone scan as well as CT scan was positive for extensive metastases Patient is off Casodex approximately 4 weeks ago Patient is on Alliance protocol HPI:   64 year old gentleman with stage IV castration resistant prostate cancer  Oncology History   carcinoma  of prostatestage IIc, and  T2 N0 M0 diagnosis in February of 2014 patient had PSA of 18 patient antiandrogentherapyand therapy.  According to him in May of 2015 patient had PSA of 90 was started on androgen deprivation therapy any dropped to 20.  And then 18 later on.  Now PSA is rising againto 26 documented on October 03 2014.  Patient's bone scan and CT revealed extensive metastasis Patient had received Casodex and Trelstar.  2.  Castration resistant prostate cancer.  Patient was started on now ZYTIGA plus minus XTANDI in June of 2016       Cancer of prostate   08/17/2012 Initial Diagnosis Cancer of prostate, stage II   12/03/2013 Progression    10/13/2014 Progression     Oncology Flowsheet 11/25/2014 12/23/2014 01/20/2015 02/17/2015 03/25/2015  denosumab (XGEVA) [place] - - - 120 mg 120 mg  Zoledronic Acid (ZOMETA) IV 4 mg 4 mg 4 mg - -    INTERVAL HISTORY:  Patient is on Alliance protocol receiving ZYTIGA plus minus XTANDI.  Abdominal distention and abdominal pain is improved.  No nausea no vomiting.  Appetite is improving.  No bony pains Patient is here for ongoing evaluation.  As occasional hematuria patient has a previous history of multiple kidney stones.  Recently passing stone.  Back pain is improved. Patient is on protocol  also receiving   XGEVA REVIEW OF SYSTEMS:   GENERAL:  Feels good.  Active.  No fevers, sweats or weight loss. PERFORMANCE STATUS (ECOG): 0 HEENT:  No visual changes, runny nose, sore throat, mouth sores or tenderness. Lungs: No shortness of breath or cough.  No hemoptysis. Cardiac:  No chest pain, palpitations, orthopnea, or PND. GI:  No nausea, vomiting, diarrhea, constipation, melena or hematochezia. GU:  No urgency, frequency, dysuria, or hematuria. Musculoskeletal:  No back pain.  No joint pain.  No muscle tenderness. Extremities:  No pain or swelling. Skin:  No rashes or skin changes. Neuro:  No headache, numbness or weakness, balance or coordination issues. Endocrine:  No diabetes, thyroid issues, hot flashes or night sweats. Psych:  No mood changes, depression or anxiety. Pain:  No focal pain. Review of systems:  All other systems reviewed and found to be negative. As per HPI. Otherwise, a complete review of systems is negatve.  PAST MEDICAL HISTORY: Past Medical History  Diagnosis Date  . Prostate cancer   . Cancer of prostate 11/18/2014    Significant History/PMH:   Prostate Cancer:   Preventive Screening:  Has patient had any of the following test? Colonscopy  Prostate Exam (1)   Last Colonoscopy: October 2013(1)   Last Prostate Exam: 2015(1)   Smoking History: Smoking History Never Smoked.(1)  PFSH: Comments: no significant family history of any malignancy.  Social History: negative alcohol, negative tobacco  Additional Past Medical and Surgical  History: no significant history of hypertension or diabetes or coronary artery disease   ADVANCED DIRECTIVES:  Patient does have advance healthcare directive, Patient   does not desire to make any changes HEALTH MAINTENANCE: Social History  Substance Use Topics  . Smoking status: Never Smoker   . Smokeless tobacco: None  . Alcohol Use: No      Allergies  Allergen Reactions  . No Known Allergies      Current Outpatient Prescriptions  Medication Sig Dispense Refill  . abiraterone Acetate (ZYTIGA) 250 MG tablet Take 4 tablets (1,000 mg total) by mouth daily. Take on an empty stomach 1 hour before or 2 hours after a meal 120 tablet 0  . abiraterone Acetate (ZYTIGA) 250 MG tablet Take 4 tablets (1,000 mg total) by mouth daily. Take on an empty stomach 1 hour before or 2 hours after a meal 120 tablet 0  . ciprofloxacin (CIPRO) 500 MG tablet Take 1 tablet (500 mg total) by mouth 2 (two) times daily. 14 tablet 0  . Investigational enzalutamide 40 MG capsule ALLIANCE [id number] Take 4 capsules (160 mg total) by mouth daily. Take with or without food.Swallow whole. Do not crush, or open capsules. 120 capsule 0  . Investigational enzalutamide 40 MG capsule ALLIANCE [id number] Take 4 capsules (160 mg total) by mouth daily. Take with or without food.Swallow whole. Do not crush, or open capsules. 120 capsule 0  . Investigational enzalutamide 40 MG capsule ALLIANCE [id number] Take 4 capsules (160 mg total) by mouth daily. Take with or without food.Swallow whole. Do not crush, or open capsules. 120 capsule 0  . OxyCODONE HCl, Abuse Deter, (OXAYDO) 5 MG TABA Take 1 tablet by mouth every 4 (four) hours as needed. 30 tablet 0  . predniSONE (DELTASONE) 5 MG tablet Take 1 tablet (5 mg total) by mouth 2 (two) times daily with a meal. 60 tablet 6  . predniSONE (DELTASONE) 5 MG tablet Take 1 tablet (5 mg total) by mouth 2 (two) times daily. 60 tablet 0  . predniSONE (DELTASONE) 5 MG tablet Take 1 tablet (5 mg total) by mouth 2 (two) times daily. 60 tablet 0  . Triptorelin Pamoate (TRELSTAR) 22.5 MG injection Inject 22.5 mg into the muscle every 6 (six) months.     No current facility-administered medications for this visit.    OBJECTIVE:  There were no vitals filed for this visit.   There is no weight on file to calculate BMI.    ECOG FS:0 - Asymptomatic  PHYSICAL EXAM: GENERAL:  Well developed, well nourished,  sitting comfortably in the exam room in no acute distress. MENTAL STATUS:  Alert and oriented to person, place and time. HEAD:   hair.  Normocephalic, atraumatic, face symmetric, no Cushingoid features. EYES:    Pupils equal round and reactive to light and accomodation.  No conjunctivitis or scleral icterus. ENT:  Oropharynx clear without lesion.  Tongue normal. Mucous membranes moist.  RESPIRATORY:  Clear to auscultation without rales, wheezes or rhonchi. CARDIOVASCULAR:  Regular rate and rhythm without murmur, rub or gallop. . ABDOMEN:  Soft, non-tender, with active bowel sounds, and no hepatosplenomegaly.  No masses. BACK:  No CVA tenderness.  No tenderness on percussion of the back or rib cage. SKIN:  No rashes, ulcers or lesions. EXTREMITIES: No edema, no skin discoloration or tenderness.  No palpable cords. LYMPH NODES: No palpable cervical, supraclavicular, axillary or inguinal adenopathy  NEUROLOGICAL: Unremarkable. PSYCH:  Appropriate.   LAB RESULTS:  No visits with results within 2  Day(s) from this visit. Latest known visit with results is:  Appointment on 03/16/2015  Component Date Value Ref Range Status  . WBC 03/16/2015 7.1  3.8 - 10.6 K/uL Final  . RBC 03/16/2015 4.18* 4.40 - 5.90 MIL/uL Final  . Hemoglobin 03/16/2015 13.7  13.0 - 18.0 g/dL Final  . HCT 03/16/2015 40.0  40.0 - 52.0 % Final  . MCV 03/16/2015 95.8  80.0 - 100.0 fL Final  . MCH 03/16/2015 32.8  26.0 - 34.0 pg Final  . MCHC 03/16/2015 34.3  32.0 - 36.0 g/dL Final  . RDW 03/16/2015 13.7  11.5 - 14.5 % Final  . Platelets 03/16/2015 187  150 - 440 K/uL Final  . Neutrophils Relative % 03/16/2015 71   Final  . Neutro Abs 03/16/2015 5.1  1.4 - 6.5 K/uL Final  . Lymphocytes Relative 03/16/2015 14   Final  . Lymphs Abs 03/16/2015 1.0  1.0 - 3.6 K/uL Final  . Monocytes Relative 03/16/2015 13   Final  . Monocytes Absolute 03/16/2015 0.9  0.2 - 1.0 K/uL Final  . Eosinophils Relative 03/16/2015 1   Final  .  Eosinophils Absolute 03/16/2015 0.0  0 - 0.7 K/uL Final  . Basophils Relative 03/16/2015 1   Final  . Basophils Absolute 03/16/2015 0.0  0 - 0.1 K/uL Final  . Sodium 03/16/2015 139  135 - 145 mmol/L Final  . Potassium 03/16/2015 3.7  3.5 - 5.1 mmol/L Final  . Chloride 03/16/2015 104  101 - 111 mmol/L Final  . CO2 03/16/2015 26  22 - 32 mmol/L Final  . Glucose, Bld 03/16/2015 96  65 - 99 mg/dL Final  . BUN 03/16/2015 25* 6 - 20 mg/dL Final  . Creatinine, Ser 03/16/2015 1.52* 0.61 - 1.24 mg/dL Final  . Calcium 03/16/2015 8.6* 8.9 - 10.3 mg/dL Final  . Total Protein 03/16/2015 7.6  6.5 - 8.1 g/dL Final  . Albumin 03/16/2015 4.0  3.5 - 5.0 g/dL Final  . AST 03/16/2015 14* 15 - 41 U/L Final  . ALT 03/16/2015 10* 17 - 63 U/L Final  . Alkaline Phosphatase 03/16/2015 67  38 - 126 U/L Final  . Total Bilirubin 03/16/2015 1.1  0.3 - 1.2 mg/dL Final  . GFR calc non Af Amer 03/16/2015 46* >60 mL/min Final  . GFR calc Af Amer 03/16/2015 54* >60 mL/min Final   Comment: (NOTE) The eGFR has been calculated using the CKD EPI equation. This calculation has not been validated in all clinical situations. eGFR's persistently <60 mL/min signify possible Chronic Kidney Disease.   . Anion gap 03/16/2015 9  5 - 15 Final  . PSA 03/16/2015 37.45* 0.00 - 4.00 ng/mL Final   Comment: (NOTE) While PSA levels of <=4.0 ng/ml are reported as reference range, some men with levels below 4.0 ng/ml can have prostate cancer and many men with PSA above 4.0 ng/ml do not have prostate cancer.  Other tests such as free PSA, age specific reference ranges, PSA velocity and PSA doubling time may be helpful especially in men less than 22 years old. Performed at [facility]   . Specimen Description 03/16/2015 URINE, CLEAN CATCH   Final  . Special Requests 03/16/2015 URINE   Final  . Culture 03/16/2015 3,000 COLONIES/mL INSIGNIFICANT GROWTH   Final  . Report Status 03/16/2015 03/18/2015 FINAL   Final  . Color, Urine  03/16/2015 AMBER* YELLOW Final  . APPearance 03/16/2015 CLOUDY* CLEAR Final  . Glucose, UA 03/16/2015 NEGATIVE  NEGATIVE mg/dL Final  .  Bilirubin Urine 03/16/2015 NEGATIVE  NEGATIVE Final  . Ketones, ur 03/16/2015 TRACE* NEGATIVE mg/dL Final  . Specific Gravity, Urine 03/16/2015 1.036* 1.005 - 1.030 Final  . Hgb urine dipstick 03/16/2015 3+* NEGATIVE Final  . pH 03/16/2015 5.0  5.0 - 8.0 Final  . Protein, ur 03/16/2015 100* NEGATIVE mg/dL Final  . Nitrite 03/16/2015 NEGATIVE  NEGATIVE Final  . Leukocytes, UA 03/16/2015 2+* NEGATIVE Final  . RBC / HPF 03/16/2015 TOO NUMEROUS TO COUNT  0 - 5 RBC/hpf Final  . WBC, UA 03/16/2015 TOO NUMEROUS TO COUNT  0 - 5 WBC/hpf Final  . Bacteria, UA 03/16/2015 RARE* NONE SEEN Final  . Squamous Epithelial / LPF 03/16/2015 0-5* NONE SEEN Final  . WBC Clumps 03/16/2015 PRESENT   Final  . Mucous 03/16/2015 PRESENT   Final  . Hyaline Casts, UA 03/16/2015 PRESENT   Final  . 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 On Alliance protocol with ascites plus minus XTANDI  Tolerating treatment very well.  As per PSA criteria PSA has dropped again from previous slightly elevated level .Clinically  Bony pains have improved. Patient is due for CT scan prior to next visit as per protocol 2.  Hematuria most likely secondary to nephrolithiasis Will culture urine to be sure patient does not have any infection     MEDICAL DECISION MAKING:  All lab data has been reviewed and reviewed Discussed situation with protocol nurses PSA has been now declining  All medications have been reviewed XGEVA has been ordered for next week  Calcium level is  within acceptable range patient is taking calcium tablet.  Hematuria.  Pending culture   If needed report Cipro can be started Patient expressed understanding and was in agreement with this plan. He also understands that He can call clinic at any time with any questions, concerns, or complaints.  Pain   Management:oxycodone controls the pain    Cancer of prostate   Staging form: Prostate, AJCC 7th Edition     Clinical: Stage IV (T2, M1) - Signed by Evlyn Kanner, NP on 01/07/2015   Forest Gleason, MD   04/14/2015 11:33 AM

## 2015-04-14 NOTE — Progress Notes (Signed)
Patient does not have living will.  Never smoked.  Patient still has some renal calculi and reports scant amount of blood occasionally in his urine.  Also complains of hemorroids.

## 2015-04-15 ENCOUNTER — Encounter: Payer: Self-pay | Admitting: Oncology

## 2015-04-16 LAB — URINE CULTURE: CULTURE: NO GROWTH

## 2015-04-22 ENCOUNTER — Inpatient Hospital Stay: Payer: Medicare Other | Attending: Oncology

## 2015-04-22 VITALS — BP 121/74 | HR 64 | Temp 97.0°F

## 2015-04-22 DIAGNOSIS — Z5112 Encounter for antineoplastic immunotherapy: Secondary | ICD-10-CM | POA: Insufficient documentation

## 2015-04-22 DIAGNOSIS — Z006 Encounter for examination for normal comparison and control in clinical research program: Secondary | ICD-10-CM | POA: Insufficient documentation

## 2015-04-22 DIAGNOSIS — Z79899 Other long term (current) drug therapy: Secondary | ICD-10-CM | POA: Diagnosis not present

## 2015-04-22 DIAGNOSIS — C61 Malignant neoplasm of prostate: Secondary | ICD-10-CM | POA: Diagnosis not present

## 2015-04-22 DIAGNOSIS — C7951 Secondary malignant neoplasm of bone: Secondary | ICD-10-CM | POA: Diagnosis not present

## 2015-04-22 DIAGNOSIS — Z87442 Personal history of urinary calculi: Secondary | ICD-10-CM | POA: Insufficient documentation

## 2015-04-22 DIAGNOSIS — R319 Hematuria, unspecified: Secondary | ICD-10-CM | POA: Diagnosis not present

## 2015-04-22 DIAGNOSIS — Z23 Encounter for immunization: Secondary | ICD-10-CM | POA: Insufficient documentation

## 2015-04-22 DIAGNOSIS — N1330 Unspecified hydronephrosis: Secondary | ICD-10-CM | POA: Diagnosis not present

## 2015-04-22 DIAGNOSIS — R188 Other ascites: Secondary | ICD-10-CM | POA: Diagnosis not present

## 2015-04-22 MED ORDER — INFLUENZA VAC SPLIT QUAD 0.5 ML IM SUSY
0.5000 mL | PREFILLED_SYRINGE | Freq: Once | INTRAMUSCULAR | Status: AC
  Administered 2015-04-22: 0.5 mL via INTRAMUSCULAR
  Filled 2015-04-22: qty 0.5

## 2015-04-22 MED ORDER — DENOSUMAB 120 MG/1.7ML ~~LOC~~ SOLN
120.0000 mg | Freq: Once | SUBCUTANEOUS | Status: AC
  Administered 2015-04-22: 120 mg via SUBCUTANEOUS
  Filled 2015-04-22: qty 1.7

## 2015-05-10 ENCOUNTER — Encounter
Admission: RE | Admit: 2015-05-10 | Discharge: 2015-05-10 | Disposition: A | Discharge status: Home / Self Care | Payer: Medicare Other | Source: Ambulatory Visit | Attending: Oncology | Admitting: Oncology

## 2015-05-10 DIAGNOSIS — C61 Malignant neoplasm of prostate: Secondary | ICD-10-CM | POA: Diagnosis not present

## 2015-05-10 MED ORDER — TECHNETIUM TC 99M MEDRONATE IV KIT
23.8900 | PACK | Freq: Once | INTRAVENOUS | Status: AC | PRN
  Administered 2015-05-10: 23.89 via INTRAVENOUS

## 2015-05-11 ENCOUNTER — Other Ambulatory Visit: Payer: Self-pay | Admitting: *Deleted

## 2015-05-11 ENCOUNTER — Ambulatory Visit
Admission: RE | Admit: 2015-05-11 | Discharge: 2015-05-11 | Disposition: A | Discharge status: Home / Self Care | Payer: Medicare Other | Source: Ambulatory Visit | Attending: Oncology | Admitting: Oncology

## 2015-05-11 DIAGNOSIS — C786 Secondary malignant neoplasm of retroperitoneum and peritoneum: Secondary | ICD-10-CM | POA: Insufficient documentation

## 2015-05-11 DIAGNOSIS — N201 Calculus of ureter: Secondary | ICD-10-CM | POA: Diagnosis not present

## 2015-05-11 DIAGNOSIS — C7951 Secondary malignant neoplasm of bone: Secondary | ICD-10-CM | POA: Diagnosis not present

## 2015-05-11 DIAGNOSIS — N138 Other obstructive and reflux uropathy: Secondary | ICD-10-CM | POA: Diagnosis not present

## 2015-05-11 DIAGNOSIS — J439 Emphysema, unspecified: Secondary | ICD-10-CM | POA: Insufficient documentation

## 2015-05-11 DIAGNOSIS — C61 Malignant neoplasm of prostate: Secondary | ICD-10-CM | POA: Diagnosis not present

## 2015-05-11 MED ORDER — ABIRATERONE ACETATE 250 MG PO TABS: 1000.0000 mg | ORAL_TABLET | Freq: Every day | ORAL | Status: DC

## 2015-05-11 MED ORDER — IOHEXOL 300 MG/ML  SOLN
100.0000 mL | Freq: Once | INTRAMUSCULAR | Status: AC | PRN
  Administered 2015-05-11: 100 mL via INTRAVENOUS

## 2015-05-12 ENCOUNTER — Encounter: Payer: Self-pay | Admitting: Oncology

## 2015-05-12 ENCOUNTER — Inpatient Hospital Stay (HOSPITAL_BASED_OUTPATIENT_CLINIC_OR_DEPARTMENT_OTHER): Payer: Medicare Other | Admitting: Oncology

## 2015-05-12 ENCOUNTER — Encounter: Payer: Self-pay | Admitting: *Deleted

## 2015-05-12 ENCOUNTER — Inpatient Hospital Stay: Payer: Medicare Other

## 2015-05-12 VITALS — BP 139/80 | HR 70 | Temp 95.7°F | Wt 163.1 lb

## 2015-05-12 DIAGNOSIS — C61 Malignant neoplasm of prostate: Secondary | ICD-10-CM

## 2015-05-12 DIAGNOSIS — R319 Hematuria, unspecified: Secondary | ICD-10-CM | POA: Diagnosis not present

## 2015-05-12 DIAGNOSIS — N200 Calculus of kidney: Secondary | ICD-10-CM | POA: Diagnosis not present

## 2015-05-12 DIAGNOSIS — R0981 Nasal congestion: Secondary | ICD-10-CM

## 2015-05-12 DIAGNOSIS — Z7952 Long term (current) use of systemic steroids: Secondary | ICD-10-CM

## 2015-05-12 DIAGNOSIS — Z5112 Encounter for antineoplastic immunotherapy: Secondary | ICD-10-CM | POA: Diagnosis not present

## 2015-05-12 DIAGNOSIS — Z006 Encounter for examination for normal comparison and control in clinical research program: Secondary | ICD-10-CM

## 2015-05-12 DIAGNOSIS — C7951 Secondary malignant neoplasm of bone: Secondary | ICD-10-CM | POA: Diagnosis not present

## 2015-05-12 DIAGNOSIS — R188 Other ascites: Secondary | ICD-10-CM | POA: Diagnosis not present

## 2015-05-12 DIAGNOSIS — Z79899 Other long term (current) drug therapy: Secondary | ICD-10-CM

## 2015-05-12 LAB — COMPREHENSIVE METABOLIC PANEL
ALBUMIN: 4.4 g/dL (ref 3.5–5.0)
ALK PHOS: 61 U/L (ref 38–126)
ALT: 13 U/L — AB (ref 17–63)
AST: 18 U/L (ref 15–41)
Anion gap: 6 (ref 5–15)
BUN: 22 mg/dL — AB (ref 6–20)
CALCIUM: 8.5 mg/dL — AB (ref 8.9–10.3)
CO2: 27 mmol/L (ref 22–32)
CREATININE: 1.23 mg/dL (ref 0.61–1.24)
Chloride: 103 mmol/L (ref 101–111)
GFR calc Af Amer: >60 mL/min (ref >60)
GFR calc non Af Amer: 60 mL/min — ABNORMAL LOW (ref >60)
GLUCOSE: 91 mg/dL (ref 65–99)
Potassium: 3.8 mmol/L (ref 3.5–5.1)
SODIUM: 136 mmol/L (ref 135–145)
Total Bilirubin: 0.7 mg/dL (ref 0.3–1.2)
Total Protein: 7.6 g/dL (ref 6.5–8.1)

## 2015-05-12 LAB — CBC WITH DIFFERENTIAL/PLATELET
BASOS PCT: 1 %
Basophils Absolute: 0 10*3/uL (ref 0–0.1)
EOS ABS: 0.1 10*3/uL (ref 0–0.7)
Eosinophils Relative: 3 %
HEMATOCRIT: 42.1 % (ref 40.0–52.0)
HEMOGLOBIN: 14.3 g/dL (ref 13.0–18.0)
LYMPHS ABS: 1 10*3/uL (ref 1.0–3.6)
Lymphocytes Relative: 26 %
MCH: 32.9 pg (ref 26.0–34.0)
MCHC: 34 g/dL (ref 32.0–36.0)
MCV: 96.8 fL (ref 80.0–100.0)
Monocytes Absolute: 0.5 10*3/uL (ref 0.2–1.0)
Monocytes Relative: 14 %
NEUTROS ABS: 2.2 10*3/uL (ref 1.4–6.5)
NEUTROS PCT: 56 %
Platelets: 218 10*3/uL (ref 150–440)
RBC: 4.35 MIL/uL — AB (ref 4.40–5.90)
RDW: 13.7 % (ref 11.5–14.5)
WBC: 3.9 10*3/uL (ref 3.8–10.6)

## 2015-05-12 LAB — PSA: PSA: 22.38 ng/mL — ABNORMAL HIGH (ref 0.00–4.00)

## 2015-05-12 MED ORDER — FLUTICASONE PROPIONATE 50 MCG/ACT NA SUSP: 1.0000 | Freq: Every day | NASAL | Status: DC

## 2015-05-12 MED ORDER — ABIRATERONE ACETATE 250 MG PO TABS: 1000.0000 mg | ORAL_TABLET | Freq: Every day | ORAL | Status: DC

## 2015-05-12 NOTE — Progress Notes (Signed)
05/12/2015 @11 :45am Mr. Smithson returns to clinic this morning for consideration of cycle 7 treatment with Enzalutamide/Abiraterone/Prednisone. States he has been doing fairly well since last visit. He has returned his med calendars and completed his PK questionnaire. Patient had his CT scan performed yesterday and his bone scan on 05/10/15. Scans reviewed by Dr. Oliva Bustard and are confirming stable disease at this time. He does have a kidney stone that showed up on CT and patient reports he had a transient episode of sharp flank pain that woke him up during the night and was radiating around to his stomach. He does not know how long the pain lasted, but states it only occurred once. He denies any pain at present. Dr. Oliva Bustard to refer him back to his urologist to address the kidney stone. VS are stable. CBC & chemistry values are within acceptable parameters for patient to continue on Alliance protocol regimen. Pt states he will get his shipment of Abiraterone tomorrow and verifies that he has plenty of Prednisone on hand. Mr. Amirault also needs to get Delton See, but will need to wait until next week for this. New medication calendars provided to patient for the next 4 week cycle. Old bottle of Enzalutamide collected and pill count reveals 8 capsules remaining this cycle, which validates that he is taking this medication as prescribed. He also returned his Abiraterone bottle, which shows 12 capsules remaining and also verifies accuracy since he will take 4 more capsules tonight. Current AEs with grade and attribution as follows:       Fatigue - grade 1; definitely related to study drug Hot flashes - grade 1; unlikely related to study drug Hemorrhoids - grade 1; unrelated      Flank pain - grade 1; unrelated (r/t kidney stone)      Renal calculi - grade 2; unrelated - patient referred to  urologist      Sinusitis - grade 1; unrelated (states seasonal)

## 2015-05-13 ENCOUNTER — Telehealth: Payer: Self-pay | Admitting: *Deleted

## 2015-05-13 ENCOUNTER — Encounter: Payer: Self-pay | Admitting: Oncology

## 2015-05-13 MED ORDER — HYDROCORTISONE ACETATE 25 MG RE SUPP: 25.0000 mg | Freq: Two times a day (BID) | RECTAL | Status: DC | PRN

## 2015-05-13 NOTE — Telephone Encounter (Signed)
Pt has hemorrhoids and has tried OTC medications with no relief. Per MD, will escribed rx for anusol HC BID PRN #[id number] and pt needs to try sitz bath for relief as well. [name], RN to notify pt of MD recommendations.

## 2015-05-15 ENCOUNTER — Encounter: Payer: Self-pay | Admitting: Oncology

## 2015-05-15 NOTE — Progress Notes (Signed)
[place] @ [facility] [phone]  Fax:([phone]) [place]: 1951-03-27  MR#: [id number]  YHO#:[id number]  Patient Care Team: No Pcp Per Patient as PCP - General (General Practice) Royston Cowper, MD (Urology)  CHIEF COMPLAINT:  Chief Complaint  Patient presents with  . 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 Patient was on androgen deprivation therapy as well as Casodex.  Rising tumor marker.  Bone scan as well as CT scan was positive for extensive metastases Patient is off Casodex approximately 4 weeks ago Patient is on Alliance protocol HPI:   64 year old gentleman with stage IV castration resistant prostate cancer  Oncology History   carcinoma  of prostatestage IIc, and  T2 N0 M0 diagnosis in February of 2014 patient had PSA of 18 patient antiandrogentherapyand therapy.  According to him in May of 2015 patient had PSA of 90 was started on androgen deprivation therapy any dropped to 20.  And then 18 later on.  Now PSA is rising againto 26 documented on October 03 2014.  Patient's bone scan and CT revealed extensive metastasis Patient had received Casodex and Trelstar.  2.  Castration resistant prostate cancer.  Patient was started on now ZYTIGA plus minus XTANDI in June of 2016       Cancer of prostate [facility])   08/17/2012 Initial Diagnosis Cancer of prostate, stage II   12/03/2013 Progression    10/13/2014 Progression     Oncology Flowsheet 11/25/2014 12/23/2014 01/20/2015 02/17/2015 03/25/2015 04/22/2015  denosumab (XGEVA) [place] - - - 120 mg 120 mg 120 mg  Zoledronic Acid (ZOMETA) IV 4 mg 4 mg 4 mg - - -    INTERVAL HISTORY:  Patient is on Alliance protocol receiving ZYTIGA plus minus XTANDI.  Abdominal distention and abdominal pain is improved.  No nausea no vomiting.  Appetite is improving.  No bony pains Patient is here for ongoing evaluation.  As occasional hematuria patient has a previous history of multiple kidney  stones.  Recently passing stone.  Back pain is improved. Patient is on protocol also receiving   XGEVA  Patient is here for ongoing evaluation and treatment consideration.  Patient does not have any aches and pains and bony pain.  I had discussion with radiologist regarding left hydronephrosis. REVIEW OF SYSTEMS:   GENERAL:  Feels good.  Active.  No fevers, sweats or weight loss. PERFORMANCE STATUS (ECOG): 0 HEENT:  No visual changes, runny nose, sore throat, mouth sores or tenderness. Lungs: No shortness of breath or cough.  No hemoptysis. Cardiac:  No chest pain, palpitations, orthopnea, or PND. GI:  No nausea, vomiting, diarrhea, constipation, melena or hematochezia. GU:  No urgency, frequency, dysuria, or hematuria. Musculoskeletal:  No back pain.  No joint pain.  No muscle tenderness. Extremities:  No pain or swelling. Skin:  No rashes or skin changes. Neuro:  No headache, numbness or weakness, balance or coordination issues. Endocrine:  No diabetes, thyroid issues, hot flashes or night sweats. Psych:  No mood changes, depression or anxiety. Pain:  No focal pain. Review of systems:  All other systems reviewed and found to be negative. As per HPI. Otherwise, a complete review of systems is negatve.  PAST MEDICAL HISTORY: Past Medical History  Diagnosis Date  . Prostate cancer ([place])   . Cancer of prostate ([place]) 11/18/2014    Significant History/PMH:   Prostate Cancer:   Preventive Screening:  Has patient had any of the following test?  Colonscopy  Prostate Exam (1)   Last Colonoscopy: October 2013(1)   Last Prostate Exam: 2015(1)   Smoking History: Smoking History Never Smoked.(1)  PFSH: Comments: no significant family history of any malignancy.  Social History: negative alcohol, negative tobacco  Additional Past Medical and Surgical History: no significant history of hypertension or diabetes or coronary artery disease   ADVANCED DIRECTIVES:  Patient does have  advance healthcare directive, Patient   does not desire to make any changes HEALTH MAINTENANCE: Social History  Substance Use Topics  . Smoking status: Never Smoker   . Smokeless tobacco: None  . Alcohol Use: No      Allergies  Allergen Reactions  . No Known Allergies     Current Outpatient Prescriptions  Medication Sig Dispense Refill  . abiraterone Acetate (ZYTIGA) 250 MG tablet Take 4 tablets (1,000 mg total) by mouth daily. Take on an empty stomach 1 hour before or 2 hours after a meal 120 tablet 3  . abiraterone Acetate (ZYTIGA) 250 MG tablet Take 4 tablets (1,000 mg total) by mouth daily. Take on an empty stomach 1 hour before or 2 hours after a meal 120 tablet 0  . Investigational enzalutamide 40 MG capsule ALLIANCE [id number] Take 4 capsules (160 mg total) by mouth daily. Take with or without food.Swallow whole. Do not crush, or open capsules. 120 capsule 0  . Investigational enzalutamide 40 MG capsule ALLIANCE [id number] Take 4 capsules (160 mg total) by mouth daily. Take with or without food.Swallow whole. Do not crush, or open capsules. 120 capsule 0  . Investigational enzalutamide 40 MG capsule ALLIANCE [id number] Take 4 capsules (160 mg total) by mouth daily. Take with or without food.Swallow whole. Do not crush, or open capsules. 120 capsule 0  . OxyCODONE HCl, Abuse Deter, (OXAYDO) 5 MG TABA Take 1 tablet by mouth every 4 (four) hours as needed. 30 tablet 0  . predniSONE (DELTASONE) 5 MG tablet Take 1 tablet (5 mg total) by mouth 2 (two) times daily with a meal. 60 tablet 6  . Triptorelin Pamoate (TRELSTAR) 22.5 MG injection Inject 22.5 mg into the muscle every 6 (six) months.    . fluticasone (FLONASE) 50 MCG/ACT nasal spray Place 1 spray into both nostrils daily. 16 g 2  . hydrocortisone (ANUSOL-HC) 25 MG suppository Place 1 suppository (25 mg total) rectally 2 (two) times daily as needed for hemorrhoids or itching. 12 suppository 0   No current facility-administered medications  for this visit.    OBJECTIVE:  Filed Vitals:   05/12/15 1143  BP: 139/80  Pulse: 70  Temp: 95.7 F (35.4 C)     Body mass index is 23.36 kg/(m^2).    ECOG FS:0 - Asymptomatic  PHYSICAL EXAM: GENERAL:  Well developed, well nourished, sitting comfortably in the exam room in no acute distress. MENTAL STATUS:  Alert and oriented to person, place and time. HEAD:   hair.  Normocephalic, atraumatic, face symmetric, no Cushingoid features. EYES:    Pupils equal round and reactive to light and accomodation.  No conjunctivitis or scleral icterus. ENT:  Oropharynx clear without lesion.  Tongue normal. Mucous membranes moist.  RESPIRATORY:  Clear to auscultation without rales, wheezes or rhonchi. CARDIOVASCULAR:  Regular rate and rhythm without murmur, rub or gallop. . ABDOMEN:  Soft, non-tender, with active bowel sounds, and no hepatosplenomegaly.  No masses. BACK:  No CVA tenderness.  No tenderness on percussion of the back or rib cage. SKIN:  No rashes, ulcers or lesions. EXTREMITIES: No edema,  no skin discoloration or tenderness.  No palpable cords. LYMPH NODES: No palpable cervical, supraclavicular, axillary or inguinal adenopathy  NEUROLOGICAL: Unremarkable. PSYCH:  Appropriate.   LAB RESULTS:  Appointment on 05/12/2015  Component Date Value Ref Range Status  . WBC 05/12/2015 3.9  3.8 - 10.6 K/uL Final  . RBC 05/12/2015 4.35* 4.40 - 5.90 MIL/uL Final  . Hemoglobin 05/12/2015 14.3  13.0 - 18.0 g/dL Final  . HCT 05/12/2015 42.1  40.0 - 52.0 % Final  . MCV 05/12/2015 96.8  80.0 - 100.0 fL Final  . MCH 05/12/2015 32.9  26.0 - 34.0 pg Final  . MCHC 05/12/2015 34.0  32.0 - 36.0 g/dL Final  . RDW 05/12/2015 13.7  11.5 - 14.5 % Final  . Platelets 05/12/2015 218  150 - 440 K/uL Final  . Neutrophils Relative % 05/12/2015 56   Final  . Neutro Abs 05/12/2015 2.2  1.4 - 6.5 K/uL Final  . Lymphocytes Relative 05/12/2015 26   Final  . Lymphs Abs 05/12/2015 1.0  1.0 - 3.6 K/uL Final  .  Monocytes Relative 05/12/2015 14   Final  . Monocytes Absolute 05/12/2015 0.5  0.2 - 1.0 K/uL Final  . Eosinophils Relative 05/12/2015 3   Final  . Eosinophils Absolute 05/12/2015 0.1  0 - 0.7 K/uL Final  . Basophils Relative 05/12/2015 1   Final  . Basophils Absolute 05/12/2015 0.0  0 - 0.1 K/uL Final  . Sodium 05/12/2015 136  135 - 145 mmol/L Final  . Potassium 05/12/2015 3.8  3.5 - 5.1 mmol/L Final  . Chloride 05/12/2015 103  101 - 111 mmol/L Final  . CO2 05/12/2015 27  22 - 32 mmol/L Final  . Glucose, Bld 05/12/2015 91  65 - 99 mg/dL Final  . BUN 05/12/2015 22* 6 - 20 mg/dL Final  . Creatinine, Ser 05/12/2015 1.23  0.61 - 1.24 mg/dL Final  . Calcium 05/12/2015 8.5* 8.9 - 10.3 mg/dL Final  . Total Protein 05/12/2015 7.6  6.5 - 8.1 g/dL Final  . Albumin 05/12/2015 4.4  3.5 - 5.0 g/dL Final  . AST 05/12/2015 18  15 - 41 U/L Final  . ALT 05/12/2015 13* 17 - 63 U/L Final  . Alkaline Phosphatase 05/12/2015 61  38 - 126 U/L Final  . Total Bilirubin 05/12/2015 0.7  0.3 - 1.2 mg/dL Final  . GFR calc non Af Amer 05/12/2015 60* >60 mL/min Final  . GFR calc Af Amer 05/12/2015 >60  >60 mL/min Final   Comment: (NOTE) The eGFR has been calculated using the CKD EPI equation. This calculation has not been validated in all clinical situations. eGFR's persistently <60 mL/min signify possible Chronic Kidney Disease.   . Anion gap 05/12/2015 6  5 - 15 Final  . PSA 05/12/2015 22.38* 0.00 - 4.00 ng/mL Final   Comment: (NOTE) While PSA levels of <=4.0 ng/ml are reported as reference range, some men with levels below 4.0 ng/ml can have prostate cancer and many men with PSA above 4.0 ng/ml do not have prostate cancer.  Other tests such as free PSA, age specific reference ranges, PSA velocity and PSA doubling time may be helpful especially in men less than 61 years old. Performed at [facility] ([facility])       STUDIES: Component     Latest Ref Rng 11/12/2014 12/23/2014 01/20/2015 02/17/2015 03/16/2015    PSA     0.00 - 4.00 ng/mL 59.6 (H) 22.91 (H) 19.93 (H) 21.38 (H) 37.45 (H)   Component  Latest Ref Rng 04/14/2015  PSA     0.00 - 4.00 ng/mL 20.91 (H)    ASSESSMENT: Castration resistant prostate cancer STAGE iv DISEASE. On Alliance protocol with ascites plus minus XTANDI  Tolerating treatment very well.  As per PSA criteria PSA has dropped again from previous slightly elevated level .Clinically  Bony pains have improved. Patient is due for CT scan prior to next visit as per protocol 2.  Hematuria most likely secondary to nephrolithiasis Will culture urine to be sure patient does not have any infection CT scan has been reviewed independently and reviewed with the patient.  There is a left hydronephrosis most likely due to kidney stones patient has been referred to urology for further opinion. I had discussion with radiologist.   MEDICAL DECISION MAKING:  All lab data has been reviewed and reviewed Discussed situation with protocol nurses PSA has been now declining  All medications have been reviewed XGEVA has been ordered for next week  Calcium level is within acceptable range patient is taking calcium tablet.  continue present [place] and ZYTIGA.  Continue our  Protocol PSA remains stable Previous bone scan also has been reviewed and shows stable disease Patient has been referred to urologist Patient will continue androgen deprivation therapy with urologist   Cancer of prostate   Staging form: Prostate, AJCC 7th Edition     Clinical: Stage IV (T2, M1) - Signed by Evlyn Kanner, NP on 01/07/2015   Forest Gleason, MD   05/15/2015 12:05 PM

## 2015-05-18 DIAGNOSIS — N201 Calculus of ureter: Secondary | ICD-10-CM | POA: Diagnosis not present

## 2015-05-18 DIAGNOSIS — D400 Neoplasm of uncertain behavior of prostate: Secondary | ICD-10-CM | POA: Diagnosis not present

## 2015-05-19 ENCOUNTER — Inpatient Hospital Stay: Payer: Medicare Other | Attending: Oncology

## 2015-05-19 VITALS — BP 138/76 | HR 76 | Temp 96.7°F | Resp 18

## 2015-05-19 DIAGNOSIS — N200 Calculus of kidney: Secondary | ICD-10-CM | POA: Insufficient documentation

## 2015-05-19 DIAGNOSIS — Z006 Encounter for examination for normal comparison and control in clinical research program: Secondary | ICD-10-CM | POA: Diagnosis not present

## 2015-05-19 DIAGNOSIS — K649 Unspecified hemorrhoids: Secondary | ICD-10-CM | POA: Insufficient documentation

## 2015-05-19 DIAGNOSIS — R5383 Other fatigue: Secondary | ICD-10-CM | POA: Insufficient documentation

## 2015-05-19 DIAGNOSIS — Z79899 Other long term (current) drug therapy: Secondary | ICD-10-CM | POA: Insufficient documentation

## 2015-05-19 DIAGNOSIS — C7951 Secondary malignant neoplasm of bone: Secondary | ICD-10-CM | POA: Insufficient documentation

## 2015-05-19 DIAGNOSIS — Z87442 Personal history of urinary calculi: Secondary | ICD-10-CM | POA: Diagnosis not present

## 2015-05-19 DIAGNOSIS — R109 Unspecified abdominal pain: Secondary | ICD-10-CM | POA: Diagnosis not present

## 2015-05-19 DIAGNOSIS — R232 Flushing: Secondary | ICD-10-CM | POA: Diagnosis not present

## 2015-05-19 DIAGNOSIS — C61 Malignant neoplasm of prostate: Secondary | ICD-10-CM | POA: Insufficient documentation

## 2015-05-19 MED ORDER — DENOSUMAB 120 MG/1.7ML ~~LOC~~ SOLN
120.0000 mg | Freq: Once | SUBCUTANEOUS | Status: AC
  Administered 2015-05-19: 120 mg via SUBCUTANEOUS
  Filled 2015-05-19: qty 1.7

## 2015-05-20 ENCOUNTER — Ambulatory Visit
Admission: RE | Admit: 2015-05-20 | Discharge: 2015-05-20 | Disposition: A | Discharge status: Home / Self Care | Payer: Medicare Other | Source: Ambulatory Visit | Attending: Urology | Admitting: Urology

## 2015-05-20 ENCOUNTER — Encounter: Payer: Self-pay | Admitting: *Deleted

## 2015-05-20 ENCOUNTER — Encounter
Admission: RE | Disposition: A | Discharge status: Home / Self Care | Payer: Self-pay | Source: Ambulatory Visit | Attending: Urology

## 2015-05-20 DIAGNOSIS — Z79899 Other long term (current) drug therapy: Secondary | ICD-10-CM | POA: Diagnosis not present

## 2015-05-20 DIAGNOSIS — Z87442 Personal history of urinary calculi: Secondary | ICD-10-CM | POA: Insufficient documentation

## 2015-05-20 DIAGNOSIS — Z7952 Long term (current) use of systemic steroids: Secondary | ICD-10-CM | POA: Insufficient documentation

## 2015-05-20 DIAGNOSIS — N201 Calculus of ureter: Secondary | ICD-10-CM | POA: Insufficient documentation

## 2015-05-20 DIAGNOSIS — Z9889 Other specified postprocedural states: Secondary | ICD-10-CM | POA: Diagnosis not present

## 2015-05-20 HISTORY — PX: EXTRACORPOREAL SHOCK WAVE LITHOTRIPSY: SHX1557

## 2015-05-20 SURGERY — LITHOTRIPSY, ESWL
Anesthesia: Moderate Sedation | Laterality: Left

## 2015-05-20 MED ORDER — MIDAZOLAM HCL 2 MG/2ML IJ SOLN
1.0000 mg | Freq: Once | INTRAMUSCULAR | Status: AC
  Administered 2015-05-20: 1 mg via INTRAMUSCULAR

## 2015-05-20 MED ORDER — PROMETHAZINE HCL 25 MG/ML IJ SOLN
25.0000 mg | Freq: Once | INTRAMUSCULAR | Status: AC
  Administered 2015-05-20: 25 mg via INTRAMUSCULAR

## 2015-05-20 MED ORDER — LEVOFLOXACIN 500 MG PO TABS
500.0000 mg | ORAL_TABLET | Freq: Once | ORAL | Status: AC
  Administered 2015-05-20: 500 mg via ORAL

## 2015-05-20 MED ORDER — FUROSEMIDE 10 MG/ML IJ SOLN
INTRAMUSCULAR | Status: AC
  Filled 2015-05-20: qty 2

## 2015-05-20 MED ORDER — LEVOFLOXACIN 500 MG PO TABS
ORAL_TABLET | ORAL | Status: AC
  Filled 2015-05-20: qty 1

## 2015-05-20 MED ORDER — MORPHINE SULFATE (PF) 2 MG/ML IV SOLN
10.0000 mg | Freq: Once | INTRAVENOUS | Status: DC
  Filled 2015-05-20: qty 5

## 2015-05-20 MED ORDER — PROMETHAZINE HCL 25 MG/ML IJ SOLN
INTRAMUSCULAR | Status: AC
  Filled 2015-05-20: qty 1

## 2015-05-20 MED ORDER — ONDANSETRON 8 MG PO TBDP: 8.0000 mg | ORAL_TABLET | Freq: Four times a day (QID) | ORAL | Status: DC | PRN

## 2015-05-20 MED ORDER — DIPHENHYDRAMINE HCL 25 MG PO CAPS
25.0000 mg | ORAL_CAPSULE | ORAL | Status: AC
  Administered 2015-05-20: 25 mg via ORAL

## 2015-05-20 MED ORDER — MORPHINE SULFATE (PF) 10 MG/ML IV SOLN
INTRAVENOUS | Status: AC
  Administered 2015-05-20: 10 mg
  Filled 2015-05-20: qty 1

## 2015-05-20 MED ORDER — DIPHENHYDRAMINE HCL 25 MG PO CAPS
ORAL_CAPSULE | ORAL | Status: AC
  Filled 2015-05-20: qty 1

## 2015-05-20 MED ORDER — MIDAZOLAM HCL 2 MG/2ML IJ SOLN
INTRAMUSCULAR | Status: AC
  Filled 2015-05-20: qty 2

## 2015-05-20 MED ORDER — FUROSEMIDE 10 MG/ML IJ SOLN
10.0000 mg | Freq: Once | INTRAMUSCULAR | Status: AC
  Administered 2015-05-20: 10 mg via INTRAVENOUS

## 2015-05-20 MED ORDER — DEXTROSE-NACL 5-0.45 % IV SOLN
INTRAVENOUS | Status: DC
  Administered 2015-05-20: 07:00:00 via INTRAVENOUS

## 2015-05-20 MED ORDER — NUCYNTA 50 MG PO TABS: 50.0000 mg | ORAL_TABLET | Freq: Four times a day (QID) | ORAL | Status: DC | PRN

## 2015-05-20 MED ORDER — TAMSULOSIN HCL 0.4 MG PO CAPS: 0.4000 mg | ORAL_CAPSULE | Freq: Every day | ORAL | Status: DC

## 2015-05-20 MED ORDER — LEVOFLOXACIN 500 MG PO TABS: 500.0000 mg | ORAL_TABLET | Freq: Every day | ORAL | Status: DC

## 2015-05-20 NOTE — Discharge Instructions (Addendum)
Dietary Guidelines to Help Prevent Kidney Stones °Your risk of kidney stones can be decreased by adjusting the foods you eat. The most important thing you can do is drink enough fluid. You should drink enough fluid to keep your urine clear or pale yellow. The following guidelines provide specific information for the type of kidney stone you have had. °GUIDELINES ACCORDING TO TYPE OF KIDNEY STONE °Calcium Oxalate Kidney Stones °· Reduce the amount of salt you eat. Foods that have a lot of salt cause your body to release excess calcium into your urine. The excess calcium can combine with a substance called oxalate to form kidney stones. °· Reduce the amount of animal protein you eat if the amount you eat is excessive. Animal protein causes your body to release excess calcium into your urine. Ask your dietitian how much protein from animal sources you should be eating. °· Avoid foods that are high in oxalates. If you take vitamins, they should have less than 500 mg of vitamin C. Your body turns vitamin C into oxalates. You do not need to avoid fruits and vegetables high in vitamin C. °Calcium Phosphate Kidney Stones °· Reduce the amount of salt you eat to help prevent the release of excess calcium into your urine. °· Reduce the amount of animal protein you eat if the amount you eat is excessive. Animal protein causes your body to release excess calcium into your urine. Ask your dietitian how much protein from animal sources you should be eating. °· Get enough calcium from food or take a calcium supplement (ask your dietitian for recommendations). Food sources of calcium that do not increase your risk of kidney stones include: °¨ Broccoli. °¨ Dairy products, such as cheese and yogurt. °¨ Pudding. °Uric Acid Kidney Stones °· Do not have more than 6 oz of animal protein per day. °FOOD SOURCES °Animal Protein Sources °· Meat (all types). °· Poultry. °· Eggs. °· Fish, seafood. °Foods High in Salt °· Salt seasonings. °· Soy  sauce. °· Teriyaki sauce. °· Cured and processed meats. °· Salted crackers and snack foods. °· Fast food. °· Canned soups and most canned foods. °Foods High in Oxalates °· Grains: °¨ Amaranth. °¨ Barley. °¨ Grits. °¨ Wheat germ. °¨ Bran. °¨ Buckwheat flour. °¨ All bran cereals. °¨ Pretzels. °¨ Whole wheat bread. °· Vegetables: °¨ Beans (wax). °¨ Beets and beet greens. °¨ Collard greens. °¨ Eggplant. °¨ Escarole. °¨ Leeks. °¨ Okra. °¨ Parsley. °¨ Rutabagas. °¨ Spinach. °¨ Swiss chard. °¨ Tomato paste. °¨ Fried potatoes. °¨ Sweet potatoes. °· Fruits: °¨ Red currants. °¨ Figs. °¨ Kiwi. °¨ Rhubarb. °· Meat and Other Protein Sources: °¨ Beans (dried). °¨ Soy burgers and other soybean products. °¨ Miso. °¨ Nuts (peanuts, almonds, pecans, cashews, hazelnuts). °¨ Nut butters. °¨ Sesame seeds and tahini (paste made of sesame seeds). °¨ Poppy seeds. °· Beverages: °¨ Chocolate drink mixes. °¨ Soy milk. °¨ Instant iced tea. °¨ Juices made from high-oxalate fruits or vegetables. °· Other: °¨ Carob. °¨ Chocolate. °¨ Fruitcake. °¨ Marmalades. °  °This information is not intended to replace advice given to you by your health care provider. Make sure you discuss any questions you have with your health care provider. °  °Document Released: 10/28/2010 Document Revised: 07/08/2013 Document Reviewed: 05/30/2013 °Elsevier Interactive Patient Education ©2016 [employer] ° ° °Kidney Stones °Kidney stones (urolithiasis) are deposits that form inside your kidneys. The intense pain is caused by the stone moving through the urinary tract. When the stone moves, the   ureter goes into spasm around the stone. The stone is usually passed in the urine.  °CAUSES  °· A disorder that makes certain neck glands produce too much parathyroid hormone (primary hyperparathyroidism). °· A buildup of uric acid crystals, similar to gout in your joints. °· Narrowing (stricture) of the ureter. °· A kidney obstruction present at birth (congenital  obstruction). °· Previous surgery on the kidney or ureters. °· Numerous kidney infections. °SYMPTOMS  °· Feeling sick to your stomach (nauseous). °· Throwing up (vomiting). °· Blood in the urine (hematuria). °· Pain that usually spreads (radiates) to the groin. °· Frequency or urgency of urination. °DIAGNOSIS  °· Taking a history and physical exam. °· Blood or urine tests. °· CT scan. °· Occasionally, an examination of the inside of the urinary bladder (cystoscopy) is performed. °TREATMENT  °· Observation. °· Increasing your fluid intake. °· Extracorporeal shock wave lithotripsy--This is a noninvasive procedure that uses shock waves to break up kidney stones. °· Surgery may be needed if you have severe pain or persistent obstruction. There are various surgical procedures. Most of the procedures are performed with the use of small instruments. Only small incisions are needed to accommodate these instruments, so recovery time is minimized. °The size, location, and chemical composition are all important variables that will determine the proper choice of action for you. Talk to your health care provider to better understand your situation so that you will minimize the risk of injury to yourself and your kidney.  °HOME CARE INSTRUCTIONS  °· Drink enough water and fluids to keep your urine clear or pale yellow. This will help you to pass the stone or stone fragments. °· Strain all urine through the provided strainer. Keep all particulate matter and stones for your health care provider to see. The stone causing the pain may be as small as a grain of salt. It is very important to use the strainer each and every time you pass your urine. The collection of your stone will allow your health care provider to analyze it and verify that a stone has actually passed. The stone analysis will often identify what you can do to reduce the incidence of recurrences. °· Only take over-the-counter or prescription medicines for pain,  discomfort, or fever as directed by your health care provider. °· Keep all follow-up visits as told by your health care provider. This is important. °· Get follow-up X-rays if required. The absence of pain does not always mean that the stone has passed. It may have only stopped moving. If the urine remains completely obstructed, it can cause loss of kidney function or even complete destruction of the kidney. It is your responsibility to make sure X-rays and follow-ups are completed. Ultrasounds of the kidney can show blockages and the status of the kidney. Ultrasounds are not associated with any radiation and can be performed easily in a matter of minutes. °· Make changes to your daily diet as told by your health care provider. You may be told to: °¨ Limit the amount of salt that you eat. °¨ Eat 5 or more servings of fruits and vegetables each day. °¨ Limit the amount of meat, poultry, fish, and eggs that you eat. °· Collect a 24-hour urine sample as told by your health care provider. You may need to collect another urine sample every 6-12 months. °SEEK MEDICAL CARE IF: °· You experience pain that is progressive and unresponsive to any pain medicine you have been prescribed. °SEEK IMMEDIATE MEDICAL CARE IF:  °·   Pain cannot be controlled with the prescribed medicine. °· You have a fever or shaking chills. °· The severity or intensity of pain increases over 18 hours and is not relieved by pain medicine. °· You develop a new onset of abdominal pain. °· You feel faint or pass out. °· You are unable to urinate. °  °This information is not intended to replace advice given to you by your health care provider. Make sure you discuss any questions you have with your health care provider. °  °Document Released: 07/03/2005 Document Revised: 03/24/2015 Document Reviewed: 12/04/2012 °Elsevier Interactive Patient Education ©2016 [employer] ° °Lithotripsy, Care After °Refer to this sheet in the next few weeks. These instructions  provide you with information on caring for yourself after your procedure. Your health care provider may also give you more specific instructions. Your treatment has been planned according to current medical practices, but problems sometimes occur. Call your health care provider if you have any problems or questions after your procedure. °WHAT TO EXPECT AFTER THE PROCEDURE  °· Your urine may have a red tinge for a few days after treatment. Blood loss is usually minimal. °· You may have soreness in the back or flank area. This usually goes away after a few days. The procedure can cause blotches or bruises on the back where the pressure wave enters the skin. These marks usually cause only minimal discomfort and should disappear in a short time. °· Stone fragments should begin to pass within 24 hours of treatment. However, a delayed passage is not unusual. °· You may have pain, discomfort, and feel sick to your stomach (nauseated) when the crushed fragments of stone are passed down the tube from the kidney to the bladder. Stone fragments can pass soon after the procedure and may last for up to 4-8 weeks. °· A small number of patients may have severe pain when stone fragments are not able to pass, which leads to an obstruction. °· If your stone is greater than 1 inch (2.5 cm) in diameter or if you have multiple stones that have a combined diameter greater than 1 inch (2.5 cm), you may require more than one treatment. °· If you had a stent placed prior to your procedure, you may experience some discomfort, especially during urination. You may experience the pain or discomfort in your flank or back, or you may experience a sharp pain or discomfort at the base of your penis or in your lower abdomen. The discomfort usually lasts only a few minutes after urinating. °HOME CARE INSTRUCTIONS  °· Rest at home until you feel your energy improving. °· Only take over-the-counter or prescription medicines for pain, discomfort, or  fever as directed by your health care provider. Depending on the type of lithotripsy, you may need to take antibiotics and anti-inflammatory medicines for a few days. °· Drink enough water and fluids to keep your urine clear or pale yellow. This helps "flush" your kidneys. It helps pass any remaining pieces of stone and prevents stones from coming back. °· Most people can resume daily activities within 1-2 days after standard lithotripsy. It can take longer to recover from laser and percutaneous lithotripsy. °· Strain all urine through the provided strainer. Keep all particulate matter and stones for your health care provider to see. The stone may be as small as a grain of salt. It is very important to use the strainer each and every time you pass your urine. Any stones that are found can be sent   to a medical lab for examination. °· Visit your health care provider for a follow-up appointment in a few weeks. Your doctor may remove your stent if you have one. Your health care provider will also check to see whether stone particles still remain. °SEEK MEDICAL CARE IF:  °· Your pain is not relieved by medicine. °· You have a lasting nauseous feeling. °· You feel there is too much blood in the urine. °· You develop persistent problems with frequent or painful urination that does not at least partially improve after 2 days following the procedure. °· You have a congested cough. °· You feel lightheaded. °· You develop a rash or any other signs that might suggest an allergic problem. °· You develop any reaction or side effects to your medicine(s). °SEEK IMMEDIATE MEDICAL CARE IF:  °· You experience severe back or flank pain or both. °· You see nothing but blood when you urinate. °· You cannot pass any urine at all. °· You have a fever or shaking chills. °· You develop shortness of breath, difficulty breathing, or chest pain. °· You develop vomiting that will not stop after 6-8 hours. °· You have a fainting episode. °  °This  information is not intended to replace advice given to you by your health care provider. Make sure you discuss any questions you have with your health care provider. °  °Document Released: 07/23/2007 Document Revised: 03/24/2015 Document Reviewed: 01/16/2013 °Elsevier Interactive Patient Education ©2016 [employer] ° °Lithotripsy °Lithotripsy is a treatment that can sometimes help eliminate kidney stones and pain that they cause. A form of lithotripsy, also known as extracorporeal shock wave lithotripsy, is a nonsurgical procedure that helps your body rid itself of the kidney stone when it is too big to pass on its own. Extracorporeal shock wave lithotripsy is a method of crushing a kidney stone with shock waves. These shock waves pass through your body and are focused on your stone. They cause the kidney stones to crumble while still in the urinary tract. It is then easier for the smaller pieces of stone to pass in the urine. °Lithotripsy usually takes about an hour. It is done in a hospital, a lithotripsy center, or a mobile unit. It usually does not require an overnight stay. Your health care provider will instruct you on preparation for the procedure. Your health care provider will tell you what to expect afterward. °LET YOUR HEALTH CARE PROVIDER KNOW ABOUT: °· Any allergies you have. °· All medicines you are taking, including vitamins, herbs, eye drops, creams, and over-the-counter medicines. °· Previous problems you or members of your family have had with the use of anesthetics. °· Any blood disorders you have. °· Previous surgeries you have had. °· Medical conditions you have. °RISKS AND COMPLICATIONS °Generally, lithotripsy for kidney stones is a safe procedure. However, as with any procedure, complications can occur. Possible complications include: °· Infection. °· Bleeding of the kidney. °· Bruising of the kidney or skin. °· Obstruction of the ureter. °· Failure of the stone to fragment. °BEFORE THE  PROCEDURE °· Do not eat or drink for 6-8 hours prior to the procedure. You may, however, take the medications with a sip of water that your physician instructs you to take °· Do not take aspirin or aspirin-containing products for 7 days prior to your procedure °· Do not take nonsteroidal anti-inflammatory products for 7 days prior to your procedure °PROCEDURE °A stent (flexible tube with holes) may be placed in your ureter. The ureter   is the tube that transports the urine from the kidneys to the bladder. Your health care provider may place a stent before the procedure. This will help keep urine flowing from the kidney if the fragments of the stone block the ureter. You may have an IV tube placed in one of your veins to give you fluids and medicines. These medicines may help you relax or make you sleep. During the procedure, you will lie comfortably on a fluid-filled cushion or in a warm-water bath. After an X-ray or ultrasound exam to locate your stone, shock waves are aimed at the stone. If you are awake, you may feel a tapping sensation as the shock waves pass through your body. If large stone particles remain after treatment, a second procedure may be necessary at a later date. °For comfort during the test: °· Relax as much as possible. °· Try to remain still as much as possible. °· Try to follow instructions to speed up the test. °· Let your health care provider know if you are uncomfortable, anxious, or in pain. °AFTER THE PROCEDURE  °After surgery, you will be taken to the recovery area. A nurse will watch and check your progress. Once you're awake, stable, and taking fluids well, you will be allowed to go home as long as there are no problems. You will also be allowed to pass your urine before discharge. You may be given antibiotics to help prevent infection. You may also be prescribed pain medicine if needed. In a week or two, your health care provider may remove your stent, if you have one. You may first  have an X-ray exam to check on how successful the fragmentation of your stone has been and how much of the stone has passed. Your health care provider will check to see whether or not stone particles remain. °SEEK IMMEDIATE MEDICAL CARE IF: °· You develop a fever or shaking chills. °· Your pain is not relieved by medicine. °· You feel sick to your stomach (nauseated) and you vomit. °· You develop heavy bleeding. °· You have difficulty urinating. °· You start to pass your stent from your penis. °  °This information is not intended to replace advice given to you by your health care provider. Make sure you discuss any questions you have with your health care provider. °  °Document Released: 06/30/2000 Document Revised: 07/24/2014 Document Reviewed: 01/16/2013 °Elsevier Interactive Patient Education ©2016 [employer]. ° °

## 2015-05-20 NOTE — Progress Notes (Signed)
To bathroom on arrival to hospital, and 0723 just prior to transport to truck To truck via wheelchair by Phillips Grout, RN at [phone]

## 2015-05-27 DIAGNOSIS — N201 Calculus of ureter: Secondary | ICD-10-CM | POA: Diagnosis not present

## 2015-06-09 ENCOUNTER — Encounter: Payer: Self-pay | Admitting: *Deleted

## 2015-06-09 ENCOUNTER — Inpatient Hospital Stay: Payer: Medicare Other

## 2015-06-09 ENCOUNTER — Encounter: Payer: Self-pay | Admitting: Internal Medicine

## 2015-06-09 ENCOUNTER — Other Ambulatory Visit: Payer: Self-pay | Admitting: Family Medicine

## 2015-06-09 ENCOUNTER — Inpatient Hospital Stay (HOSPITAL_BASED_OUTPATIENT_CLINIC_OR_DEPARTMENT_OTHER): Payer: Medicare Other | Admitting: Family Medicine

## 2015-06-09 VITALS — BP 131/78 | HR 71 | Temp 96.0°F | Resp 18 | Wt 161.4 lb

## 2015-06-09 DIAGNOSIS — C61 Malignant neoplasm of prostate: Secondary | ICD-10-CM | POA: Diagnosis not present

## 2015-06-09 DIAGNOSIS — N200 Calculus of kidney: Secondary | ICD-10-CM

## 2015-06-09 DIAGNOSIS — K649 Unspecified hemorrhoids: Secondary | ICD-10-CM

## 2015-06-09 DIAGNOSIS — Z006 Encounter for examination for normal comparison and control in clinical research program: Secondary | ICD-10-CM | POA: Diagnosis not present

## 2015-06-09 DIAGNOSIS — Z87442 Personal history of urinary calculi: Secondary | ICD-10-CM

## 2015-06-09 DIAGNOSIS — Z7951 Long term (current) use of inhaled steroids: Secondary | ICD-10-CM | POA: Diagnosis not present

## 2015-06-09 DIAGNOSIS — R5383 Other fatigue: Secondary | ICD-10-CM

## 2015-06-09 DIAGNOSIS — Z79899 Other long term (current) drug therapy: Secondary | ICD-10-CM

## 2015-06-09 DIAGNOSIS — R232 Flushing: Secondary | ICD-10-CM

## 2015-06-09 DIAGNOSIS — R109 Unspecified abdominal pain: Secondary | ICD-10-CM

## 2015-06-09 DIAGNOSIS — C7951 Secondary malignant neoplasm of bone: Secondary | ICD-10-CM | POA: Diagnosis not present

## 2015-06-09 LAB — CBC WITH DIFFERENTIAL/PLATELET
BASOS ABS: 0 10*3/uL (ref 0–0.1)
BASOS PCT: 1 %
EOS ABS: 0.1 10*3/uL (ref 0–0.7)
EOS PCT: 2 %
HCT: 40.2 % (ref 40.0–52.0)
Hemoglobin: 13.8 g/dL (ref 13.0–18.0)
Lymphocytes Relative: 21 %
Lymphs Abs: 1.4 10*3/uL (ref 1.0–3.6)
MCH: 32.9 pg (ref 26.0–34.0)
MCHC: 34.3 g/dL (ref 32.0–36.0)
MCV: 95.8 fL (ref 80.0–100.0)
Monocytes Absolute: 0.7 10*3/uL (ref 0.2–1.0)
Monocytes Relative: 10 %
NEUTROS PCT: 66 %
Neutro Abs: 4.4 10*3/uL (ref 1.4–6.5)
PLATELETS: 227 10*3/uL (ref 150–440)
RBC: 4.2 MIL/uL — AB (ref 4.40–5.90)
RDW: 13.4 % (ref 11.5–14.5)
WBC: 6.6 10*3/uL (ref 3.8–10.6)

## 2015-06-09 LAB — COMPREHENSIVE METABOLIC PANEL
ALBUMIN: 4.2 g/dL (ref 3.5–5.0)
ALT: 11 U/L — AB (ref 17–63)
AST: 15 U/L (ref 15–41)
Alkaline Phosphatase: 47 U/L (ref 38–126)
Anion gap: 5 (ref 5–15)
BUN: 19 mg/dL (ref 6–20)
CHLORIDE: 108 mmol/L (ref 101–111)
CO2: 28 mmol/L (ref 22–32)
CREATININE: 0.98 mg/dL (ref 0.61–1.24)
Calcium: 9.2 mg/dL (ref 8.9–10.3)
GFR calc non Af Amer: >60 mL/min (ref >60)
GLUCOSE: 92 mg/dL (ref 65–99)
Potassium: 4.2 mmol/L (ref 3.5–5.1)
SODIUM: 141 mmol/L (ref 135–145)
Total Bilirubin: 0.9 mg/dL (ref 0.3–1.2)
Total Protein: 6.9 g/dL (ref 6.5–8.1)

## 2015-06-09 LAB — PSA: PSA: 19.42 ng/mL — ABNORMAL HIGH (ref 0.00–4.00)

## 2015-06-09 MED ORDER — ABIRATERONE ACETATE 250 MG PO TABS: 1000.0000 mg | ORAL_TABLET | Freq: Every day | ORAL | Status: DC

## 2015-06-09 MED ORDER — PREDNISONE 5 MG PO TABS: 5.0000 mg | ORAL_TABLET | Freq: Two times a day (BID) | ORAL | Status: DC

## 2015-06-09 NOTE — Progress Notes (Signed)
06/09/2015 @10 :55am [name] returns to clinic this morning for consideration of cycle 8 treatment on the [employer] [id number] research study - with Enzalutamide/Abiraterone and Prednisone. Reports he has been doing well since last visit. He did see Dr. Yves Dill and had lithotripsy on 05/20/15, after his last appointment with Dr. Oliva Bustard. [name] reports the procedure went well without any complications. He denies any further flank pain. Continues to report fatigue, which occurs even while resting at times. Hot flashes continue as before and pt. states they are no worse, but no better. He also reports some mild constipation that has not yet resolved, and states he feels this is keeping his hemorrhoids aggrivated. He has an OTC stool softener that his pharmacist recommended, but states he has not yet taken these. Inst that if he takes the stool softener, that could help with his constipation as well as his hemorrhoids. [name] has returned his medication calendars as well as his pill bottle for the Enzalutamide only. He has 8 capsules of Enzalutamide remaining and states he has 20 capsules of the Abiraterone left. Pill count verifies med compliance for the Enzalutamide. Per the med calendar, he has been compliant with the Abiraterone and has only missed one dose of the Prednisone. VS are stable. Weight is down approx. 9 pounds since baseline; which reflects a 5.4% overall weight loss, and patient reports his appetite is poor at times. CBC and chemistries are all within acceptable parameters for patient to proceed with Cycle 8 of study as scheduled. Pharmacy dispensed 120 capsules of Enzalutamide. Return appts made for next week to receive Xgeva injection, and in 4 weeks to return to clinic and see Dr. Oliva Bustard. Patient will have CT and Bone scans performed just prior to his next clinic appt. All appts iven to patient and reviewed. Patient may have a conflicting appt, but states he will call me back this  afternoon to let me know. He also reports he has not yet received his next bottle of Abiraterone from [place]. He will call them today and make sure the prescription is being shipped on schedule. Current adverse events with grade and attribution are as follows:       Fatigue - grade 1; definitely related to study drug Hot flashes - grade 1; unlikely related to study drug Hemorrhoids - grade 1; unrelated      Constipation - grade 1; probably related  Weight loss - grade 1; probably related      Renal calculi - grade 3; unrelated - now resolved

## 2015-06-09 NOTE — Progress Notes (Signed)
[place] @ [facility] Telephone:(336) [phone]  Fax:(336) [place]: [birth year]  MR#: [id number]  QZE#:[id number]  Patient Care Team: No Pcp Per Patient as PCP - General (General Practice) Royston Cowper, MD (Urology)  CHIEF COMPLAINT:  Chief Complaint  Patient presents with  . Prostate Cancer  . no complaints  Carcinoma prostate castration  resistant prostate cancer, stage IV.  HPI:   64 year old gentleman with stage IV castration resistant prostate cancer. Patient was on androgen deprivation therapy as well as Casodex.  Then noted to have a rising tumor marker.  Bone scan as well as CT scan was positive for extensive metastases. Patient has since been enrolled in [facility] protocol. He is for consideration of cycle 8 treatment with Enzalutamide/Abiraterone/Prednisone. Followed closely by research RN, [name]. Most recent PSA in October was 22.3. He has recently had lithotripsy with Dr. Boneta Lucks and has passed at fragments as well as one whole stone. He denies any pain today. He overall feels very well and denies any acute complaints.    Oncology History   carcinoma  of prostatestage IIc, and  T2 N0 M0 diagnosis in February of 2014 patient had PSA of 18 patient antiandrogentherapyand therapy.  According to him in May of 2015 patient had PSA of 90 was started on androgen deprivation therapy any dropped to 20.  And then 18 later on.  Now PSA is rising againto 26 documented on October 03 2014.  Patient's bone scan and CT revealed extensive metastasis Patient had received Casodex and Trelstar.  2.  Castration resistant prostate cancer.  Patient was started on now ZYTIGA plus minus XTANDI in June of 2016       Cancer of prostate [facility])   08/17/2012 Initial Diagnosis Cancer of prostate, stage II   12/03/2013 Progression    10/13/2014 Progression     Oncology Flowsheet 12/23/2014 01/20/2015 02/17/2015 03/25/2015 04/22/2015 05/19/2015 05/20/2015  denosumab (XGEVA) [place] - - 120 mg  120 mg 120 mg 120 mg -  diphenhydrAMINE (BENADRYL) PO - - - - - - 25 mg  promethazine (PHENERGAN) IM - - - - - - 25 mg  Zoledronic Acid (ZOMETA) IV 4 mg 4 mg - - - - -   REVIEW OF SYSTEMS:   GENERAL:  Feels good.  Active.  No fevers, sweats or weight loss. Mild fatigue, hot flashes reported. PERFORMANCE STATUS (ECOG): 0 HEENT:  No visual changes, runny nose, sore throat, mouth sores or tenderness. Lungs: No shortness of breath or cough.  No hemoptysis. Cardiac:  No chest pain, palpitations, orthopnea, or PND. GI:  No nausea, vomiting, diarrhea, constipation, melena or hematochezia. GU:  No urgency, frequency, dysuria, or hematuria. Musculoskeletal:  No back pain.  No joint pain.  No muscle tenderness. Extremities:  No pain or swelling. Skin:  No rashes or skin changes. Neuro:  No headache, numbness or weakness, balance or coordination issues. Endocrine:  No diabetes, thyroid issues, hot flashes or night sweats. Psych:  No mood changes, depression or anxiety. Pain:  No focal pain. Review of systems:  All other systems reviewed and found to be negative. As per HPI. Otherwise, a complete review of systems is negatve.  PAST MEDICAL HISTORY: Past Medical History  Diagnosis Date  . Prostate cancer ([place])   . Cancer of prostate ([place]) 11/18/2014    Significant History/PMH:   Prostate Cancer:   Preventive Screening:  Has patient had any of the following test? Colonscopy  Prostate Exam (1)  Last Colonoscopy: October 2013(1)   Last Prostate Exam: 2015(1)   Smoking History: Smoking History Never Smoked.(1)  PFSH: Comments: no significant family history of any malignancy.  Social History: negative alcohol, negative tobacco  Additional Past Medical and Surgical History: no significant history of hypertension or diabetes or coronary artery disease   ADVANCED DIRECTIVES:  Patient does have advance healthcare directive, Patient   does not desire to make any changes HEALTH  MAINTENANCE: Social History  Substance Use Topics  . Smoking status: Never Smoker   . Smokeless tobacco: Not on file  . Alcohol Use: No      Allergies  Allergen Reactions  . No Known Allergies     Current Outpatient Prescriptions  Medication Sig Dispense Refill  . abiraterone Acetate (ZYTIGA) 250 MG tablet Take 4 tablets (1,000 mg total) by mouth daily. Take on an empty stomach 1 hour before or 2 hours after a meal 120 tablet 3  . fluticasone (FLONASE) 50 MCG/ACT nasal spray Place 1 spray into both nostrils daily. 16 g 2  . hydrocortisone (ANUSOL-HC) 25 MG suppository Place 1 suppository (25 mg total) rectally 2 (two) times daily as needed for hemorrhoids or itching. 12 suppository 0  . Investigational enzalutamide 40 MG capsule ALLIANCE [id number] Take 4 capsules (160 mg total) by mouth daily. Take with or without food.Swallow whole. Do not crush, or open capsules. 120 capsule 0  . NUCYNTA 50 MG TABS tablet Take 1 tablet (50 mg total) by mouth every 6 (six) hours as needed for moderate pain. 1 TO 2 TABS Q 6 HOURS PRN PAIN 30 tablet 0  . ondansetron (ZOFRAN ODT) 8 MG disintegrating tablet Take 1 tablet (8 mg total) by mouth every 6 (six) hours as needed for nausea or vomiting. 10 tablet 3  . OxyCODONE HCl, Abuse Deter, (OXAYDO) 5 MG TABA Take 1 tablet by mouth every 4 (four) hours as needed. 30 tablet 0  . predniSONE (DELTASONE) 5 MG tablet Take 1 tablet (5 mg total) by mouth 2 (two) times daily with a meal. 60 tablet 6  . tamsulosin (FLOMAX) 0.4 MG CAPS capsule Take 1 capsule (0.4 mg total) by mouth daily. 30 capsule 11  . Triptorelin Pamoate (TRELSTAR) 22.5 MG injection Inject 22.5 mg into the muscle every 6 (six) months.     No current facility-administered medications for this visit.    OBJECTIVE:  Filed Vitals:   06/09/15 1047  BP: 131/78  Pulse: 71  Temp: 96 F (35.6 C)  Resp: 18     Body mass index is 23.16 kg/(m^2).    ECOG FS:0 - Asymptomatic  PHYSICAL EXAM: GENERAL:   Well developed, well nourished, sitting comfortably in the exam room in no acute distress. MENTAL STATUS:  Alert and oriented to person, place and time. HEAD:  Normocephalic, atraumatic, face symmetric, no Cushingoid features. EYES:    Pupils equal round and reactive to light and accomodation.  No conjunctivitis or scleral icterus. ENT:  Oropharynx clear without lesion.  Tongue normal. Mucous membranes moist.  RESPIRATORY:  Clear to auscultation without rales, wheezes or rhonchi. CARDIOVASCULAR:  Regular rate and rhythm without murmur, rub or gallop. ABDOMEN:  Soft, non-tender, with active bowel sounds, and no hepatosplenomegaly.  No masses. BACK:  No CVA tenderness.  No tenderness on percussion of the back or rib cage. SKIN:  No rashes, ulcers or lesions. EXTREMITIES: No edema, no skin discoloration or tenderness.  No palpable cords. LYMPH NODES: No palpable cervical, supraclavicular, axillary or inguinal adenopathy  NEUROLOGICAL: Unremarkable.  PSYCH:  Appropriate.   LAB RESULTS:  Appointment on 06/09/2015  Component Date Value Ref Range Status  . WBC 06/09/2015 6.6  3.8 - 10.6 K/uL Final  . RBC 06/09/2015 4.20* 4.40 - 5.90 MIL/uL Final  . Hemoglobin 06/09/2015 13.8  13.0 - 18.0 g/dL Final  . HCT 06/09/2015 40.2  40.0 - 52.0 % Final  . MCV 06/09/2015 95.8  80.0 - 100.0 fL Final  . MCH 06/09/2015 32.9  26.0 - 34.0 pg Final  . MCHC 06/09/2015 34.3  32.0 - 36.0 g/dL Final  . RDW 06/09/2015 13.4  11.5 - 14.5 % Final  . Platelets 06/09/2015 227  150 - 440 K/uL Final  . Neutrophils Relative % 06/09/2015 66   Final  . Neutro Abs 06/09/2015 4.4  1.4 - 6.5 K/uL Final  . Lymphocytes Relative 06/09/2015 21   Final  . Lymphs Abs 06/09/2015 1.4  1.0 - 3.6 K/uL Final  . Monocytes Relative 06/09/2015 10   Final  . Monocytes Absolute 06/09/2015 0.7  0.2 - 1.0 K/uL Final  . Eosinophils Relative 06/09/2015 2   Final  . Eosinophils Absolute 06/09/2015 0.1  0 - 0.7 K/uL Final  . Basophils Relative  06/09/2015 1   Final  . Basophils Absolute 06/09/2015 0.0  0 - 0.1 K/uL Final  . Sodium 06/09/2015 141  135 - 145 mmol/L Final  . Potassium 06/09/2015 4.2  3.5 - 5.1 mmol/L Final  . Chloride 06/09/2015 108  101 - 111 mmol/L Final  . CO2 06/09/2015 28  22 - 32 mmol/L Final  . Glucose, Bld 06/09/2015 92  65 - 99 mg/dL Final  . BUN 06/09/2015 19  6 - 20 mg/dL Final  . Creatinine, Ser 06/09/2015 0.98  0.61 - 1.24 mg/dL Final  . Calcium 06/09/2015 9.2  8.9 - 10.3 mg/dL Final  . Total Protein 06/09/2015 6.9  6.5 - 8.1 g/dL Final  . Albumin 06/09/2015 4.2  3.5 - 5.0 g/dL Final  . AST 06/09/2015 15  15 - 41 U/L Final  . ALT 06/09/2015 11* 17 - 63 U/L Final  . Alkaline Phosphatase 06/09/2015 47  38 - 126 U/L Final  . Total Bilirubin 06/09/2015 0.9  0.3 - 1.2 mg/dL Final  . GFR calc non Af Amer 06/09/2015 >60  >60 mL/min Final  . GFR calc Af Amer 06/09/2015 >60  >60 mL/min Final   Comment: (NOTE) The eGFR has been calculated using the CKD EPI equation. This calculation has not been validated in all clinical situations. eGFR's persistently <60 mL/min signify possible Chronic Kidney Disease.   . Anion gap 06/09/2015 5  5 - 15 Final      STUDIES: Component     Latest Ref Rng 11/12/2014 12/23/2014 01/20/2015 02/17/2015 03/16/2015  PSA     0.00 - 4.00 ng/mL 59.6 (H) 22.91 (H) 19.93 (H) 21.38 (H) 37.45 (H)   Component     Latest Ref Rng 05/12/2015  PSA     0.00 - 4.00 ng/mL 22.3 (H)      ASSESSMENT: Castration resistant prostate cancer, stage IV. Bone mets.  MEDICAL DECISION MAKING:  1. Prostate cancer. Most recent PSA is 22.3 from October. He is currently enrolled in [facility] protocol with Enzalutamide/ Abiraterone/ Prednisone. Lab values adequate to proceed with cycle 8 of treatment today. PSA remains fairly stable, last in October 22.3. Patient will continue androgen deprivation therapy with urologist. 2. Renal calculi. Patient under went lithotripsy treatment with Dr. Boneta Lucks approximately  3 weeks ago. Hematuria  has resolved. He has also passed stone fragments as well as an additional whole stone. He denies any further issues or complaints.   Fatigue - grade 1; definitely related to study drug Hot flashes - grade 1; unlikely related to study drug Hemorrhoids - grade 1; unrelated Flank pain - grade 1; unrelated (r/t kidney stone) Sinusitis - grade 1; unrelated (states seasonal)  Cancer of prostate   Staging form: Prostate, AJCC 7th Edition     Clinical: Stage IV (T2, M1) - Signed by Evlyn Kanner, NP on 01/07/2015  Dr. [name] was available for consultation and review of plan of care for this patient.   Evlyn Kanner, NP   06/09/2015 11:15 AM

## 2015-06-16 ENCOUNTER — Other Ambulatory Visit: Payer: Self-pay | Admitting: *Deleted

## 2015-06-16 DIAGNOSIS — C61 Malignant neoplasm of prostate: Secondary | ICD-10-CM

## 2015-06-16 MED ORDER — ABIRATERONE ACETATE 250 MG PO TABS: 1000.0000 mg | ORAL_TABLET | Freq: Every day | ORAL | Status: DC

## 2015-06-17 ENCOUNTER — Inpatient Hospital Stay: Payer: Medicare Other | Attending: Oncology

## 2015-06-17 DIAGNOSIS — Z79899 Other long term (current) drug therapy: Secondary | ICD-10-CM | POA: Diagnosis not present

## 2015-06-17 DIAGNOSIS — C61 Malignant neoplasm of prostate: Secondary | ICD-10-CM | POA: Diagnosis not present

## 2015-06-17 DIAGNOSIS — R109 Unspecified abdominal pain: Secondary | ICD-10-CM | POA: Insufficient documentation

## 2015-06-17 DIAGNOSIS — Z006 Encounter for examination for normal comparison and control in clinical research program: Secondary | ICD-10-CM | POA: Insufficient documentation

## 2015-06-17 DIAGNOSIS — R1900 Intra-abdominal and pelvic swelling, mass and lump, unspecified site: Secondary | ICD-10-CM | POA: Diagnosis not present

## 2015-06-17 DIAGNOSIS — R188 Other ascites: Secondary | ICD-10-CM | POA: Diagnosis not present

## 2015-06-17 DIAGNOSIS — R319 Hematuria, unspecified: Secondary | ICD-10-CM | POA: Insufficient documentation

## 2015-06-17 DIAGNOSIS — C7951 Secondary malignant neoplasm of bone: Secondary | ICD-10-CM | POA: Diagnosis not present

## 2015-06-17 DIAGNOSIS — Z7952 Long term (current) use of systemic steroids: Secondary | ICD-10-CM | POA: Diagnosis not present

## 2015-06-17 DIAGNOSIS — N1330 Unspecified hydronephrosis: Secondary | ICD-10-CM | POA: Diagnosis not present

## 2015-06-17 DIAGNOSIS — Z87442 Personal history of urinary calculi: Secondary | ICD-10-CM | POA: Insufficient documentation

## 2015-06-17 MED ORDER — DENOSUMAB 120 MG/1.7ML ~~LOC~~ SOLN
120.0000 mg | Freq: Once | SUBCUTANEOUS | Status: AC
  Administered 2015-06-17: 120 mg via SUBCUTANEOUS
  Filled 2015-06-17: qty 1.7

## 2015-07-06 ENCOUNTER — Ambulatory Visit
Admission: RE | Admit: 2015-07-06 | Discharge: 2015-07-06 | Disposition: A | Discharge status: Home / Self Care | Payer: Medicare Other | Source: Ambulatory Visit | Attending: Oncology | Admitting: Oncology

## 2015-07-07 ENCOUNTER — Inpatient Hospital Stay: Payer: Medicare Other

## 2015-07-07 ENCOUNTER — Encounter: Payer: Self-pay | Admitting: Oncology

## 2015-07-07 ENCOUNTER — Inpatient Hospital Stay: Payer: Medicare Other | Admitting: Oncology

## 2015-07-07 ENCOUNTER — Inpatient Hospital Stay (HOSPITAL_BASED_OUTPATIENT_CLINIC_OR_DEPARTMENT_OTHER): Payer: Medicare Other | Admitting: Oncology

## 2015-07-07 VITALS — BP 150/76 | HR 67 | Temp 95.8°F | Resp 18 | Wt 160.7 lb

## 2015-07-07 DIAGNOSIS — C7951 Secondary malignant neoplasm of bone: Secondary | ICD-10-CM | POA: Diagnosis not present

## 2015-07-07 DIAGNOSIS — Z006 Encounter for examination for normal comparison and control in clinical research program: Secondary | ICD-10-CM | POA: Diagnosis not present

## 2015-07-07 DIAGNOSIS — R188 Other ascites: Secondary | ICD-10-CM

## 2015-07-07 DIAGNOSIS — C61 Malignant neoplasm of prostate: Secondary | ICD-10-CM

## 2015-07-07 DIAGNOSIS — Z79899 Other long term (current) drug therapy: Secondary | ICD-10-CM

## 2015-07-07 DIAGNOSIS — Z87442 Personal history of urinary calculi: Secondary | ICD-10-CM

## 2015-07-07 DIAGNOSIS — R319 Hematuria, unspecified: Secondary | ICD-10-CM

## 2015-07-07 DIAGNOSIS — R1900 Intra-abdominal and pelvic swelling, mass and lump, unspecified site: Secondary | ICD-10-CM

## 2015-07-07 DIAGNOSIS — R109 Unspecified abdominal pain: Secondary | ICD-10-CM

## 2015-07-07 DIAGNOSIS — N1330 Unspecified hydronephrosis: Secondary | ICD-10-CM

## 2015-07-07 DIAGNOSIS — N200 Calculus of kidney: Secondary | ICD-10-CM

## 2015-07-07 DIAGNOSIS — R0981 Nasal congestion: Secondary | ICD-10-CM

## 2015-07-07 DIAGNOSIS — Z7952 Long term (current) use of systemic steroids: Secondary | ICD-10-CM

## 2015-07-07 LAB — COMPREHENSIVE METABOLIC PANEL
ALBUMIN: 4.3 g/dL (ref 3.5–5.0)
ALK PHOS: 55 U/L (ref 38–126)
ALT: 10 U/L — AB (ref 17–63)
ANION GAP: 11 (ref 5–15)
AST: 14 U/L — ABNORMAL LOW (ref 15–41)
BILIRUBIN TOTAL: 0.7 mg/dL (ref 0.3–1.2)
BUN: 20 mg/dL (ref 6–20)
CALCIUM: 9.3 mg/dL (ref 8.9–10.3)
CO2: 25 mmol/L (ref 22–32)
CREATININE: 0.85 mg/dL (ref 0.61–1.24)
Chloride: 101 mmol/L (ref 101–111)
GFR calc Af Amer: >60 mL/min (ref >60)
GFR calc non Af Amer: >60 mL/min (ref >60)
GLUCOSE: 98 mg/dL (ref 65–99)
Potassium: 4 mmol/L (ref 3.5–5.1)
Sodium: 137 mmol/L (ref 135–145)
TOTAL PROTEIN: 7 g/dL (ref 6.5–8.1)

## 2015-07-07 LAB — CBC WITH DIFFERENTIAL/PLATELET
Basophils Absolute: 0 10*3/uL (ref 0–0.1)
Basophils Relative: 1 %
Eosinophils Absolute: 0.1 10*3/uL (ref 0–0.7)
Eosinophils Relative: 1 %
HEMATOCRIT: 39.9 % — AB (ref 40.0–52.0)
HEMOGLOBIN: 13.4 g/dL (ref 13.0–18.0)
Lymphocytes Relative: 21 %
Lymphs Abs: 1 10*3/uL (ref 1.0–3.6)
MCH: 32.6 pg (ref 26.0–34.0)
MCHC: 33.6 g/dL (ref 32.0–36.0)
MCV: 97 fL (ref 80.0–100.0)
MONOS PCT: 12 %
Monocytes Absolute: 0.5 10*3/uL (ref 0.2–1.0)
NEUTROS ABS: 3 10*3/uL (ref 1.4–6.5)
NEUTROS PCT: 65 %
Platelets: 209 10*3/uL (ref 150–440)
RBC: 4.11 MIL/uL — AB (ref 4.40–5.90)
RDW: 13.5 % (ref 11.5–14.5)
WBC: 4.6 10*3/uL (ref 3.8–10.6)

## 2015-07-07 LAB — PSA: PSA: 21.73 ng/mL — ABNORMAL HIGH (ref 0.00–4.00)

## 2015-07-07 NOTE — Progress Notes (Signed)
Patient states he is having pain above his right elbow.  Some days he states it aches.

## 2015-07-07 NOTE — Progress Notes (Signed)
[place] @ [facility] Telephone:(336) [phone]  Fax:(336) [place]: 05/09/1951  MR#: [id number]  NLG#:[id number]  Patient Care Team: No Pcp Per Patient as PCP - General (General Practice) Royston Cowper, MD (Urology)  CHIEF COMPLAINT:  Chief Complaint  Patient presents with  . 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 Patient was on androgen deprivation therapy as well as Casodex.  Rising tumor marker.  Bone scan as well as CT scan was positive for extensive metastases Patient is off Casodex approximately 4 weeks ago Patient is on [facility] protocol HPI:   64 year old gentleman with stage IV castration resistant prostate cancer  Oncology History   carcinoma  of prostatestage IIc, and  T2 N0 M0 diagnosis in February of 2014 patient had PSA of 18 patient antiandrogentherapyand therapy.  According to him in May of 2015 patient had PSA of 90 was started on androgen deprivation therapy any dropped to 20.  And then 18 later on.  Now PSA is rising againto 26 documented on October 03 2014.  Patient's bone scan and CT revealed extensive metastasis Patient had received Casodex and Trelstar.  2.  Castration resistant prostate cancer.  Patient was started on now ZYTIGA plus minus XTANDI in June of 2016       Cancer of prostate [facility])   08/17/2012 Initial Diagnosis Cancer of prostate, stage II   12/03/2013 Progression    10/13/2014 Progression     Oncology Flowsheet 01/20/2015 02/17/2015 03/25/2015 04/22/2015 05/19/2015 05/20/2015 06/17/2015  denosumab (XGEVA) [place] - 120 mg 120 mg 120 mg 120 mg - 120 mg  diphenhydrAMINE (BENADRYL) PO - - - - - 25 mg -  promethazine (PHENERGAN) IM - - - - - 25 mg -  Zoledronic Acid (ZOMETA) IV 4 mg - - - - - -    INTERVAL HISTORY:  Patient is on [facility] protocol receiving ZYTIGA plus minus XTANDI.  Abdominal distention and abdominal pain is improved.  No nausea no vomiting.  Appetite is improving.  No  bony pains Patient is here for ongoing evaluation.  As occasional hematuria patient has a previous history of multiple kidney stones.  Recently passing stone.  Back pain is improved. Patient is on protocol also receiving   XGEVA Patient is here for ongoing evaluation and treatment consideration. He has undergone treatment for nephrolithiasis.  No bony pain appetite has been fairly good.  No chills.  No fever.  PSA remained stable.   REVIEW OF SYSTEMS:   GENERAL:  Feels good.  Active.  No fevers, sweats or weight loss. PERFORMANCE STATUS (ECOG): 0 HEENT:  No visual changes, runny nose, sore throat, mouth sores or tenderness. Lungs: No shortness of breath or cough.  No hemoptysis. Cardiac:  No chest pain, palpitations, orthopnea, or PND. GI:  No nausea, vomiting, diarrhea, constipation, melena or hematochezia. GU:  No urgency, frequency, dysuria, or hematuria. Musculoskeletal:  No back pain.  No joint pain.  No muscle tenderness. Extremities:  No pain or swelling. Skin:  No rashes or skin changes. Neuro:  No headache, numbness or weakness, balance or coordination issues. Endocrine:  No diabetes, thyroid issues, hot flashes or night sweats. Psych:  No mood changes, depression or anxiety. Pain:  No focal pain. Review of systems:  All other systems reviewed and found to be negative. As per HPI. Otherwise, a complete review of systems is negatve.  PAST MEDICAL HISTORY: Past Medical History  Diagnosis Date  .  Prostate cancer ([place])   . Cancer of prostate ([place]) 11/18/2014    Significant History/PMH:   Prostate Cancer:   Preventive Screening:  Has patient had any of the following test? Colonscopy  Prostate Exam (1)   Last Colonoscopy: October 2013(1)   Last Prostate Exam: 2015(1)   Smoking History: Smoking History Never Smoked.(1)  PFSH: Comments: no significant family history of any malignancy.  Social History: negative alcohol, negative tobacco  Additional Past Medical and  Surgical History: no significant history of hypertension or diabetes or coronary artery disease   ADVANCED DIRECTIVES:  Patient does have advance healthcare directive, Patient   does not desire to make any changes HEALTH MAINTENANCE: Social History  Substance Use Topics  . Smoking status: Never Smoker   . Smokeless tobacco: None  . Alcohol Use: No      Allergies  Allergen Reactions  . No Known Allergies     Current Outpatient Prescriptions  Medication Sig Dispense Refill  . abiraterone Acetate (ZYTIGA) 250 MG tablet Take 4 tablets (1,000 mg total) by mouth daily. Take on an empty stomach 1 hour before or 2 hours after a meal 120 tablet 0  . abiraterone Acetate (ZYTIGA) 250 MG tablet Take 4 tablets (1,000 mg total) by mouth daily. Take on an empty stomach 1 hour before or 2 hours after a meal 120 tablet 11  . fluticasone (FLONASE) 50 MCG/ACT nasal spray Place 1 spray into both nostrils daily. 16 g 2  . hydrocortisone (ANUSOL-HC) 25 MG suppository Place 1 suppository (25 mg total) rectally 2 (two) times daily as needed for hemorrhoids or itching. 12 suppository 0  . Investigational enzalutamide 40 MG capsule ALLIANCE [id number] Take 4 capsules (160 mg total) by mouth daily. Take with or without food.Swallow whole. Do not crush, or open capsules. 120 capsule 0  . NUCYNTA 50 MG TABS tablet Take 1 tablet (50 mg total) by mouth every 6 (six) hours as needed for moderate pain. 1 TO 2 TABS Q 6 HOURS PRN PAIN 30 tablet 0  . ondansetron (ZOFRAN ODT) 8 MG disintegrating tablet Take 1 tablet (8 mg total) by mouth every 6 (six) hours as needed for nausea or vomiting. 10 tablet 3  . OxyCODONE HCl, Abuse Deter, (OXAYDO) 5 MG TABA Take 1 tablet by mouth every 4 (four) hours as needed. 30 tablet 0  . predniSONE (DELTASONE) 5 MG tablet Take 1 tablet (5 mg total) by mouth 2 (two) times daily. 60 tablet 0  . tamsulosin (FLOMAX) 0.4 MG CAPS capsule Take 1 capsule (0.4 mg total) by mouth daily. 30 capsule 11  .  Triptorelin Pamoate (TRELSTAR) 22.5 MG injection Inject 22.5 mg into the muscle every 6 (six) months.    . predniSONE (DELTASONE) 5 MG tablet Take 1 tablet (5 mg total) by mouth 2 (two) times daily with a meal. (Patient not taking: Reported on 07/07/2015) 60 tablet 6   No current facility-administered medications for this visit.    OBJECTIVE:  Filed Vitals:   07/07/15 1130  BP: 150/76  Pulse: 67  Temp: 95.8 F (35.4 C)  Resp: 18     Body mass index is 23.06 kg/(m^2).    ECOG FS:0 - Asymptomatic  PHYSICAL EXAM: GENERAL:  Well developed, well nourished, sitting comfortably in the exam room in no acute distress. MENTAL STATUS:  Alert and oriented to person, place and time. HEAD:   hair.  Normocephalic, atraumatic, face symmetric, no Cushingoid features. EYES:    Pupils equal round and reactive to  light and accomodation.  No conjunctivitis or scleral icterus. ENT:  Oropharynx clear without lesion.  Tongue normal. Mucous membranes moist.  RESPIRATORY:  Clear to auscultation without rales, wheezes or rhonchi. CARDIOVASCULAR:  Regular rate and rhythm without murmur, rub or gallop. . ABDOMEN:  Soft, non-tender, with active bowel sounds, and no hepatosplenomegaly.  No masses. BACK:  No CVA tenderness.  No tenderness on percussion of the back or rib cage. SKIN:  No rashes, ulcers or lesions. EXTREMITIES: No edema, no skin discoloration or tenderness.  No palpable cords. LYMPH NODES: No palpable cervical, supraclavicular, axillary or inguinal adenopathy  NEUROLOGICAL: Unremarkable. PSYCH:  Appropriate.   LAB RESULTS:  Appointment on 07/07/2015  Component Date Value Ref Range Status  . WBC 07/07/2015 4.6  3.8 - 10.6 K/uL Final  . RBC 07/07/2015 4.11* 4.40 - 5.90 MIL/uL Final  . Hemoglobin 07/07/2015 13.4  13.0 - 18.0 g/dL Final  . HCT 07/07/2015 39.9* 40.0 - 52.0 % Final  . MCV 07/07/2015 97.0  80.0 - 100.0 fL Final  . MCH 07/07/2015 32.6  26.0 - 34.0 pg Final  . MCHC 07/07/2015 33.6   32.0 - 36.0 g/dL Final  . RDW 07/07/2015 13.5  11.5 - 14.5 % Final  . Platelets 07/07/2015 209  150 - 440 K/uL Final  . Neutrophils Relative % 07/07/2015 65   Final  . Neutro Abs 07/07/2015 3.0  1.4 - 6.5 K/uL Final  . Lymphocytes Relative 07/07/2015 21   Final  . Lymphs Abs 07/07/2015 1.0  1.0 - 3.6 K/uL Final  . Monocytes Relative 07/07/2015 12   Final  . Monocytes Absolute 07/07/2015 0.5  0.2 - 1.0 K/uL Final  . Eosinophils Relative 07/07/2015 1   Final  . Eosinophils Absolute 07/07/2015 0.1  0 - 0.7 K/uL Final  . Basophils Relative 07/07/2015 1   Final  . Basophils Absolute 07/07/2015 0.0  0 - 0.1 K/uL Final  . Sodium 07/07/2015 137  135 - 145 mmol/L Final  . Potassium 07/07/2015 4.0  3.5 - 5.1 mmol/L Final  . Chloride 07/07/2015 101  101 - 111 mmol/L Final  . CO2 07/07/2015 25  22 - 32 mmol/L Final  . Glucose, Bld 07/07/2015 98  65 - 99 mg/dL Final  . BUN 07/07/2015 20  6 - 20 mg/dL Final  . Creatinine, Ser 07/07/2015 0.85  0.61 - 1.24 mg/dL Final  . Calcium 07/07/2015 9.3  8.9 - 10.3 mg/dL Final  . Total Protein 07/07/2015 7.0  6.5 - 8.1 g/dL Final  . Albumin 07/07/2015 4.3  3.5 - 5.0 g/dL Final  . AST 07/07/2015 14* 15 - 41 U/L Final  . ALT 07/07/2015 10* 17 - 63 U/L Final  . Alkaline Phosphatase 07/07/2015 55  38 - 126 U/L Final  . Total Bilirubin 07/07/2015 0.7  0.3 - 1.2 mg/dL Final  . GFR calc non Af Amer 07/07/2015 >60  >60 mL/min Final  . GFR calc Af Amer 07/07/2015 >60  >60 mL/min Final   Comment: (NOTE) The eGFR has been calculated using the CKD EPI equation. This calculation has not been validated in all clinical situations. eGFR's persistently <60 mL/min signify possible Chronic Kidney Disease.   . Anion gap 07/07/2015 11  5 - 15 Final     ago  73moago        PSA 0.00 - 4.00 ng/mL 20.91 (H) 37.45 (H)CM 21.38 (H)CM        {   ASSESSMENT: Castration resistant prostate cancer STAGE iv DISEASE. On  [facility] protocol with ascites plus minus XTANDI    Tolerating treatment very well.  As per PSA criteria PSA has dropped again from previous slightly elevated level .Clinically  Bony pains have improved. Patient is due for CT scan prior to next visit as per protocol 2.  Hematuria most likely secondary to nephrolithiasis Will culture urine to be sure patient does not have any infection CT scan has been reviewed independently and reviewed with the patient.  There is a left hydronephrosis most likely due to kidney stones patient has been referred to urology for further opinion. I had discussion with radiologist.   MEDICAL DECISION MAKING:  All lab data has been reviewed and reviewed Discussed situation with protocol nurses PSA has been now declining  All medications have been reviewed XGEVA has been ordered for next week  Calcium level is within acceptable range patient is taking calcium tablet.  continue present [place] and ZYTIGA.  Continue our  Protocol PSA remains stable Previous bone scan also has been reviewed and shows stable disease Patient has been referred to urologist Patient will continue androgen deprivation therapy with urologist Continue Gillermina Phy and ZYTIGA as per protocol Patient has been scheduled for complete bone scan and CT scan on January 17 which will be reviewed independently All of the lab data has been reviewed Discussed situation with research nurses and reviewed all the records from the research staff Duration of visit is 25 minutes and 50% of time was spent discussing the records from the research staff and reviewing all the side effect.  And making arrangements for the next bone scan and CT scan Cancer of prostate   Staging form: Prostate, AJCC 7th Edition     Clinical: Stage IV (T2, M1) - Signed by Evlyn Kanner, NP on 01/07/2015   Forest Gleason, MD   07/07/2015 11:47 AM

## 2015-07-07 NOTE — Progress Notes (Signed)
07/07/2015 @ 11:44am  Patient returns to clinic today for consideration of cycle 9 on the [facility] [id number] research study - with Enzalutamide/Abiraterone and Prednisone. Patient reports no new symptoms. He continues to have fatigue unchanged. He also reports bone pain in upper right arm. Dr. Oliva Bustard recommended taking OTC pain medication for pain in right arm. Denies further flank pain. William Wright has returned his medication calendars as well as his pill bottle for the Enzalutamide only. He has 12 pills remaining in the bottle. Pill count verifies med compliance for the Enzalutamide with the exception of missing one day; patient states he did miss one day of Enzalutamide but not sure which day. Gave patient new med calendar.  His weight is down to 72.9kg from 73.2kg on 06/09/2015. VS are stable. CBC and chemistries are all within acceptable parameters for patient to proceed with Cycle 9 of study as scheduled. Pharmacy dispensed 120 capsules of Enzalutamide. Return appointments made to see MD and have local labs drawn in 4 weeks on August 04, 2015. He also has appointment for CT with contrast scheduled for August 03, 2015. All appointments given and reviewed with the patient.                                                                Fatigue - grade 1; definitely related to study drug Hot flashes - grade 1; unlikely related to study drug Hemorrhoids - grade 1; unrelated Constipation - grade 1; probably related  Weight loss - grade 1; probably related

## 2015-07-12 ENCOUNTER — Encounter: Payer: Self-pay | Admitting: Oncology

## 2015-07-15 ENCOUNTER — Inpatient Hospital Stay: Payer: Medicare Other

## 2015-07-15 DIAGNOSIS — R188 Other ascites: Secondary | ICD-10-CM | POA: Diagnosis not present

## 2015-07-15 DIAGNOSIS — Z006 Encounter for examination for normal comparison and control in clinical research program: Secondary | ICD-10-CM | POA: Diagnosis not present

## 2015-07-15 DIAGNOSIS — C61 Malignant neoplasm of prostate: Secondary | ICD-10-CM | POA: Diagnosis not present

## 2015-07-15 DIAGNOSIS — R109 Unspecified abdominal pain: Secondary | ICD-10-CM | POA: Diagnosis not present

## 2015-07-15 DIAGNOSIS — C7951 Secondary malignant neoplasm of bone: Secondary | ICD-10-CM | POA: Diagnosis not present

## 2015-07-15 DIAGNOSIS — Z79899 Other long term (current) drug therapy: Secondary | ICD-10-CM | POA: Diagnosis not present

## 2015-07-15 MED ORDER — DENOSUMAB 120 MG/1.7ML ~~LOC~~ SOLN
120.0000 mg | Freq: Once | SUBCUTANEOUS | Status: AC
  Administered 2015-07-15: 120 mg via SUBCUTANEOUS
  Filled 2015-07-15: qty 1.7

## 2015-07-27 ENCOUNTER — Telehealth: Payer: Self-pay | Admitting: *Deleted

## 2015-07-27 DIAGNOSIS — C61 Malignant neoplasm of prostate: Secondary | ICD-10-CM

## 2015-07-27 MED ORDER — PREDNISONE 5 MG PO TABS: 5.0000 mg | ORAL_TABLET | Freq: Two times a day (BID) | ORAL | Status: DC

## 2015-07-27 NOTE — Telephone Encounter (Signed)
[facility] ref[facility]

## 2015-08-03 ENCOUNTER — Ambulatory Visit
Admission: RE | Admit: 2015-08-03 | Discharge: 2015-08-03 | Disposition: A | Discharge status: Home / Self Care | Payer: Medicare Other | Source: Ambulatory Visit | Attending: Oncology | Admitting: Oncology

## 2015-08-03 DIAGNOSIS — C7951 Secondary malignant neoplasm of bone: Secondary | ICD-10-CM | POA: Diagnosis not present

## 2015-08-03 DIAGNOSIS — N200 Calculus of kidney: Secondary | ICD-10-CM | POA: Insufficient documentation

## 2015-08-03 DIAGNOSIS — C61 Malignant neoplasm of prostate: Secondary | ICD-10-CM | POA: Diagnosis not present

## 2015-08-03 DIAGNOSIS — R911 Solitary pulmonary nodule: Secondary | ICD-10-CM | POA: Diagnosis not present

## 2015-08-03 MED ORDER — IOHEXOL 300 MG/ML  SOLN
100.0000 mL | Freq: Once | INTRAMUSCULAR | Status: AC | PRN
  Administered 2015-08-03: 100 mL via INTRAVENOUS

## 2015-08-03 MED ORDER — TECHNETIUM TC 99M MEDRONATE IV KIT
25.0000 | PACK | Freq: Once | INTRAVENOUS | Status: AC | PRN
  Administered 2015-08-03: 20.32 via INTRAVENOUS

## 2015-08-04 ENCOUNTER — Inpatient Hospital Stay: Payer: Medicare Other

## 2015-08-04 ENCOUNTER — Other Ambulatory Visit: Payer: Medicare Other

## 2015-08-04 ENCOUNTER — Encounter: Payer: Self-pay | Admitting: Oncology

## 2015-08-04 ENCOUNTER — Ambulatory Visit: Payer: Medicare Other | Admitting: Oncology

## 2015-08-04 ENCOUNTER — Inpatient Hospital Stay: Payer: Medicare Other | Attending: Oncology | Admitting: Oncology

## 2015-08-04 VITALS — BP 116/77 | HR 69 | Temp 96.1°F | Resp 18 | Wt 160.1 lb

## 2015-08-04 DIAGNOSIS — C61 Malignant neoplasm of prostate: Secondary | ICD-10-CM | POA: Diagnosis not present

## 2015-08-04 DIAGNOSIS — N200 Calculus of kidney: Secondary | ICD-10-CM

## 2015-08-04 DIAGNOSIS — Z7952 Long term (current) use of systemic steroids: Secondary | ICD-10-CM | POA: Diagnosis not present

## 2015-08-04 DIAGNOSIS — Z006 Encounter for examination for normal comparison and control in clinical research program: Secondary | ICD-10-CM | POA: Insufficient documentation

## 2015-08-04 DIAGNOSIS — Z192 Hormone resistant malignancy status: Secondary | ICD-10-CM | POA: Diagnosis not present

## 2015-08-04 DIAGNOSIS — C7951 Secondary malignant neoplasm of bone: Secondary | ICD-10-CM | POA: Diagnosis not present

## 2015-08-04 DIAGNOSIS — I712 Thoracic aortic aneurysm, without rupture: Secondary | ICD-10-CM | POA: Insufficient documentation

## 2015-08-04 DIAGNOSIS — Z79818 Long term (current) use of other agents affecting estrogen receptors and estrogen levels: Secondary | ICD-10-CM | POA: Diagnosis not present

## 2015-08-04 DIAGNOSIS — R319 Hematuria, unspecified: Secondary | ICD-10-CM

## 2015-08-04 LAB — COMPREHENSIVE METABOLIC PANEL
ALBUMIN: 4.3 g/dL (ref 3.5–5.0)
ALK PHOS: 59 U/L (ref 38–126)
ALT: 11 U/L — ABNORMAL LOW (ref 17–63)
ANION GAP: 4 — AB (ref 5–15)
AST: 13 U/L — AB (ref 15–41)
BILIRUBIN TOTAL: 0.9 mg/dL (ref 0.3–1.2)
BUN: 17 mg/dL (ref 6–20)
CO2: 31 mmol/L (ref 22–32)
Calcium: 9.4 mg/dL (ref 8.9–10.3)
Chloride: 104 mmol/L (ref 101–111)
Creatinine, Ser: 0.99 mg/dL (ref 0.61–1.24)
GFR calc Af Amer: >60 mL/min (ref >60)
GFR calc non Af Amer: >60 mL/min (ref >60)
GLUCOSE: 89 mg/dL (ref 65–99)
POTASSIUM: 3.7 mmol/L (ref 3.5–5.1)
Sodium: 139 mmol/L (ref 135–145)
TOTAL PROTEIN: 7.3 g/dL (ref 6.5–8.1)

## 2015-08-04 LAB — CBC WITH DIFFERENTIAL/PLATELET
BASOS PCT: 1 %
Basophils Absolute: 0 10*3/uL (ref 0–0.1)
Eosinophils Absolute: 0.1 10*3/uL (ref 0–0.7)
Eosinophils Relative: 3 %
HEMATOCRIT: 40.8 % (ref 40.0–52.0)
HEMOGLOBIN: 14.1 g/dL (ref 13.0–18.0)
LYMPHS PCT: 35 %
Lymphs Abs: 1.3 10*3/uL (ref 1.0–3.6)
MCH: 33.2 pg (ref 26.0–34.0)
MCHC: 34.5 g/dL (ref 32.0–36.0)
MCV: 96.3 fL (ref 80.0–100.0)
MONOS PCT: 15 %
Monocytes Absolute: 0.5 10*3/uL (ref 0.2–1.0)
NEUTROS ABS: 1.7 10*3/uL (ref 1.4–6.5)
NEUTROS PCT: 46 %
Platelets: 201 10*3/uL (ref 150–440)
RBC: 4.24 MIL/uL — ABNORMAL LOW (ref 4.40–5.90)
RDW: 13.6 % (ref 11.5–14.5)
WBC: 3.6 10*3/uL — ABNORMAL LOW (ref 3.8–10.6)

## 2015-08-04 LAB — PSA: PSA: 22.36 ng/mL — ABNORMAL HIGH (ref 0.00–4.00)

## 2015-08-04 NOTE — Progress Notes (Signed)
08/04/2015  @ 10:20am   Patient returns to clinic today for consideration of cycle 10 on the [facility] [id number] research study - with Enzalutamide/Abiraterone and Prednisone. Patient continues to have pain in right upper arm especially with movement. He states he is not currently taking anything for pain. Patient continues to have fatigue but with some improvement. Patient returned medication calendars as well as his pill bottle for Enzalutamide with 8 pills remaining in the bottle.  Gave patient new med calendar. Vital signs remain stable. His weight is down approx. 10 pounds (72.6kg;160 pounds) since baseline; which reflects a 5.9% overall weight loss.  CBC and chemistries are all within normal parameters. Reviewed scans with patient and discussed known Aortic Aneurysm. Dr. Oliva Bustard plans to send patient to Dr. Genevive Bi for follow up. Patient will return in 4 weeks, September 01, 2015 to see MD and have labs. He will also return next week, August 11, 2015 for Delton See and Trelstar injections.  AE's with  grade and attribution as follows:         Fatigue - grade 1; definitely related to study drug Hot flashes - grade 1; unlikely related to study drug Hemorrhoids - grade 1; unrelated Constipation - grade 1; probably related  Weight loss - grade 1; probably related      Arm pain- grade 1; not related to study drug

## 2015-08-04 NOTE — Progress Notes (Signed)
Patient here for CT results.  

## 2015-08-04 NOTE — Progress Notes (Signed)
William Wright @ [facility] Telephone:([phone]) [phone]  Fax:([phone]) [place]: Dec 30, 1950  MR#: [id number]  UXL#:[id number]  Patient Care Team: Provider Not In System as PCP - General Royston Cowper, MD (Urology)  CHIEF COMPLAINT:  Chief Complaint  Patient presents with  . 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 Patient was on androgen deprivation therapy as well as Casodex.  Rising tumor marker.  Bone scan as well as CT scan was positive for extensive metastases Patient is off Casodex approximately 4 weeks ago Patient is on [facility] protocol HPI:   65 year old gentleman with stage IV castration resistant prostate cancer  Oncology History   carcinoma  of prostatestage IIc, and  T2 N0 M0 diagnosis in February of 2014 patient had PSA of 18 patient antiandrogentherapyand therapy.  According to him in May of 2015 patient had PSA of 90 was started on androgen deprivation therapy any dropped to 20.  And then 18 later on.  Now PSA is rising againto 26 documented on October 03 2014.  Patient's bone scan and CT revealed extensive metastasis Patient had received Casodex and Trelstar.  2.  Castration resistant prostate cancer.  Patient was started on now ZYTIGA plus minus XTANDI in June of 2016       Cancer of prostate [facility])   08/17/2012 Initial Diagnosis Cancer of prostate, stage II   12/03/2013 Progression    10/13/2014 Progression     Oncology Flowsheet 02/17/2015 03/25/2015 04/22/2015 05/19/2015 05/20/2015 06/17/2015 07/15/2015  denosumab (XGEVA) [place] 120 mg 120 mg 120 mg 120 mg - 120 mg 120 mg  diphenhydrAMINE (BENADRYL) PO - - - - 25 mg - -  promethazine (PHENERGAN) IM - - - - 25 mg - -  Zoledronic Acid (ZOMETA) IV - - - - - - -    INTERVAL HISTORY:  Patient is on [facility] protocol receiving ZYTIGA plus minus XTANDI.  Abdominal distention and abdominal pain is improved.  No nausea no vomiting.  Appetite is improving.  No bony  pains Patient is here for ongoing evaluation.  As occasional hematuria patient has a previous history of multiple kidney stones.  Recently passing stone.  Back pain is improved. Patient is on protocol also receiving   XGEVA Patient is here for ongoing evaluation and treatment consideration. He has undergone treatment for nephrolithiasis.  No bony pain appetite has been fairly good.  No chills.  No fever.  PSA remained stable. Patient was asked to continue Trelstar at [place] by urologist office.    had a CT scan of chest and abdomen which has been reviewed independently and reviewed with the patient.  REVIEW OF SYSTEMS:   GENERAL:  Feels good.  Active.  No fevers, sweats or weight loss. PERFORMANCE STATUS (ECOG): 0 HEENT:  No visual changes, runny nose, sore throat, mouth sores or tenderness. Lungs: No shortness of breath or cough.  No hemoptysis. Cardiac:  No chest pain, palpitations, orthopnea, or PND. GI:  No nausea, vomiting, diarrhea, constipation, melena or hematochezia. GU:  No urgency, frequency, dysuria, or hematuria. Musculoskeletal:  No back pain.  No joint pain.  No muscle tenderness. Extremities:  No pain or swelling. Skin:  No rashes or skin changes. Neuro:  No headache, numbness or weakness, balance or coordination issues. Endocrine:  No diabetes, thyroid issues, hot flashes or night sweats. Psych:  No mood changes, depression or anxiety. Pain:  No focal pain. Review of systems:  All other  systems reviewed and found to be negative. As per HPI. Otherwise, a complete review of systems is negatve.  PAST MEDICAL HISTORY: Past Medical History  Diagnosis Date  . Prostate cancer ([place])   . Cancer of prostate ([place]) 11/18/2014    Significant History/PMH:   Prostate Cancer:   Preventive Screening:  Has patient had any of the following test? Colonscopy  Prostate Exam (1)   Last Colonoscopy: October 2013(1)   Last Prostate Exam: 2015(1)   Smoking History: Smoking  History Never Smoked.(1)  PFSH: Comments: no significant family history of any malignancy.  Social History: negative alcohol, negative tobacco  Additional Past Medical and Surgical History: no significant history of hypertension or diabetes or coronary artery disease   ADVANCED DIRECTIVES:  Patient does have advance healthcare directive, Patient   does not desire to make any changes HEALTH MAINTENANCE: Social History  Substance Use Topics  . Smoking status: Never Smoker   . Smokeless tobacco: None  . Alcohol Use: No      Allergies  Allergen Reactions  . No Known Allergies     Current Outpatient Prescriptions  Medication Sig Dispense Refill  . abiraterone Acetate (ZYTIGA) 250 MG tablet Take 4 tablets (1,000 mg total) by mouth daily. Take on an empty stomach 1 hour before or 2 hours after a meal 120 tablet 0  . abiraterone Acetate (ZYTIGA) 250 MG tablet Take 4 tablets (1,000 mg total) by mouth daily. Take on an empty stomach 1 hour before or 2 hours after a meal 120 tablet 11  . fluticasone (FLONASE) 50 MCG/ACT nasal spray Place 1 spray into both nostrils daily. 16 g 2  . hydrocortisone (ANUSOL-HC) 25 MG suppository Place 1 suppository (25 mg total) rectally 2 (two) times daily as needed for hemorrhoids or itching. 12 suppository 0  . Investigational enzalutamide 40 MG capsule [facility] [id number] Take 4 capsules (160 mg total) by mouth daily. Take with or without food.Swallow whole. Do not crush, or open capsules. 120 capsule 0  . NUCYNTA 50 MG TABS tablet Take 1 tablet (50 mg total) by mouth every 6 (six) hours as needed for moderate pain. 1 TO 2 TABS Q 6 HOURS PRN PAIN 30 tablet 0  . ondansetron (ZOFRAN ODT) 8 MG disintegrating tablet Take 1 tablet (8 mg total) by mouth every 6 (six) hours as needed for nausea or vomiting. 10 tablet 3  . OxyCODONE HCl, Abuse Deter, (OXAYDO) 5 MG TABA Take 1 tablet by mouth every 4 (four) hours as needed. 30 tablet 0  . predniSONE (DELTASONE) 5 MG tablet  Take 1 tablet (5 mg total) by mouth 2 (two) times daily with a meal. 60 tablet 6  . tamsulosin (FLOMAX) 0.4 MG CAPS capsule Take 1 capsule (0.4 mg total) by mouth daily. 30 capsule 11  . Triptorelin Pamoate (TRELSTAR) 22.5 MG injection Inject 22.5 mg into the muscle every 6 (six) months.     No current facility-administered medications for this visit.    OBJECTIVE:  Filed Vitals:   08/04/15 1001 08/04/15 1002  BP: 116/77   Pulse: 69   Temp: 96.1 F (35.6 C)   Resp:  18     Body mass index is 22.97 kg/(m^2).    ECOG FS:0 - Asymptomatic  PHYSICAL EXAM: GENERAL:  Well developed, well nourished, sitting comfortably in the exam room in no acute distress. MENTAL STATUS:  Alert and oriented to person, place and time. HEAD:   hair.  Normocephalic, atraumatic, face symmetric, no Cushingoid features. EYES:  Pupils equal round and reactive to light and accomodation.  No conjunctivitis or scleral icterus. ENT:  Oropharynx clear without lesion.  Tongue normal. Mucous membranes moist.  RESPIRATORY:  Clear to auscultation without rales, wheezes or rhonchi. CARDIOVASCULAR:  Regular rate and rhythm without murmur, rub or gallop. . ABDOMEN:  Soft, non-tender, with active bowel sounds, and no hepatosplenomegaly.  No masses. BACK:  No CVA tenderness.  No tenderness on percussion of the back or rib cage. SKIN:  No rashes, ulcers or lesions. EXTREMITIES: No edema, no skin discoloration or tenderness.  No palpable cords. LYMPH NODES: No palpable cervical, supraclavicular, axillary or inguinal adenopathy  NEUROLOGICAL: Unremarkable. PSYCH:  Appropriate.   LAB RESULTS:  Appointment on 08/04/2015  Component Date Value Ref Range Status  . WBC 08/04/2015 3.6* 3.8 - 10.6 K/uL Final  . RBC 08/04/2015 4.24* 4.40 - 5.90 MIL/uL Final  . Hemoglobin 08/04/2015 14.1  13.0 - 18.0 g/dL Final  . HCT 08/04/2015 40.8  40.0 - 52.0 % Final  . MCV 08/04/2015 96.3  80.0 - 100.0 fL Final  . MCH 08/04/2015 33.2   26.0 - 34.0 pg Final  . MCHC 08/04/2015 34.5  32.0 - 36.0 g/dL Final  . RDW 08/04/2015 13.6  11.5 - 14.5 % Final  . Platelets 08/04/2015 201  150 - 440 K/uL Final  . Neutrophils Relative % 08/04/2015 46   Final  . Neutro Abs 08/04/2015 1.7  1.4 - 6.5 K/uL Final  . Lymphocytes Relative 08/04/2015 35   Final  . Lymphs Abs 08/04/2015 1.3  1.0 - 3.6 K/uL Final  . Monocytes Relative 08/04/2015 15   Final  . Monocytes Absolute 08/04/2015 0.5  0.2 - 1.0 K/uL Final  . Eosinophils Relative 08/04/2015 3   Final  . Eosinophils Absolute 08/04/2015 0.1  0 - 0.7 K/uL Final  . Basophils Relative 08/04/2015 1   Final  . Basophils Absolute 08/04/2015 0.0  0 - 0.1 K/uL Final  . Sodium 08/04/2015 139  135 - 145 mmol/L Final  . Potassium 08/04/2015 3.7  3.5 - 5.1 mmol/L Final  . Chloride 08/04/2015 104  101 - 111 mmol/L Final  . CO2 08/04/2015 31  22 - 32 mmol/L Final  . Glucose, Bld 08/04/2015 89  65 - 99 mg/dL Final  . BUN 08/04/2015 17  6 - 20 mg/dL Final  . Creatinine, Ser 08/04/2015 0.99  0.61 - 1.24 mg/dL Final  . Calcium 08/04/2015 9.4  8.9 - 10.3 mg/dL Final  . Total Protein 08/04/2015 7.3  6.5 - 8.1 g/dL Final  . Albumin 08/04/2015 4.3  3.5 - 5.0 g/dL Final  . AST 08/04/2015 13* 15 - 41 U/L Final  . ALT 08/04/2015 11* 17 - 63 U/L Final  . Alkaline Phosphatase 08/04/2015 59  38 - 126 U/L Final  . Total Bilirubin 08/04/2015 0.9  0.3 - 1.2 mg/dL Final  . GFR calc non Af Amer 08/04/2015 >60  >60 mL/min Final  . GFR calc Af Amer 08/04/2015 >60  >60 mL/min Final   Comment: (NOTE) The eGFR has been calculated using the CKD EPI equation. This calculation has not been validated in all clinical situations. eGFR's persistently <60 mL/min signify possible Chronic Kidney Disease.   . Anion gap 08/04/2015 4* 5 - 15 Final     ago  33moago        PSA 0.00 - 4.00 ng/mL 20.91 (H) 37.45 (H)CM 21.38 (H)CM        {   ASSESSMENT: Castration resistant prostate  cancer STAGE iv DISEASE. On [facility]  protocol with ascites plus minus XTANDI  Tolerating treatment very well.  As per PSA criteria PSA has dropped again from previous slightly elevated level .Clinically  Bony pains have improved. Patient is due for CT scan prior to next visit as per protocol 2.  Hematuria most likely secondary to nephrolithiasis Will culture urine to be sure patient does not have any infection CT scan has been reviewed independently and reviewed with the patient.  There is a left hydronephrosis most likely due to kidney stones patient has been referred to urology for further opinion. I had discussion with radiologist.   MEDICAL DECISION MAKING:  All lab data has been reviewed and reviewed Discussed situation with protocol nurses PSA has been now declining  All medications have been reviewed XGEVA has been ordered for next week  Calcium level is within acceptable range patient is taking calcium tablet.  continue present [place] and ZYTIGA.  Continue our  Protocol PSA remains stable Previous bone scan also has been reviewed and shows stable disease Patient has been referred to urologist Patient will continue androgen deprivation therapy with urologist Continue Gillermina Phy and ZYTIGA as per protocol Patient has been scheduled for complete bone scan and CT scan on January 17 which will be reviewed independently All of the lab data has been reviewed Discussed situation with research nurses and reviewed all the records from the research staff CT scan has been reviewed independently shows partial response.  Patient also has thoracic aortic aneurysm of ascending aorta and will make arrangements for him to be evaluated by cardiothoracic surgeon has recommended.. follow-up as recommended by protocol nurses, discussed with the protocol nurses. Also  discussed the findings with the radiologist.  Guarding ascending thoracic aneurysm  JCC 7th Edition     Clinical: Stage IV (T2, M1) - Signed by Evlyn Kanner, NP  on 01/07/2015   Forest Gleason, MD   08/04/2015 10:27 AM

## 2015-08-11 ENCOUNTER — Ambulatory Visit: Payer: Medicare Other | Admitting: Oncology

## 2015-08-11 ENCOUNTER — Other Ambulatory Visit: Payer: Medicare Other

## 2015-08-11 ENCOUNTER — Inpatient Hospital Stay: Payer: Medicare Other

## 2015-08-11 ENCOUNTER — Ambulatory Visit: Payer: Medicare Other

## 2015-08-11 VITALS — BP 135/76 | HR 76 | Temp 98.0°F | Resp 18

## 2015-08-11 DIAGNOSIS — Z7952 Long term (current) use of systemic steroids: Secondary | ICD-10-CM | POA: Diagnosis not present

## 2015-08-11 DIAGNOSIS — C61 Malignant neoplasm of prostate: Secondary | ICD-10-CM

## 2015-08-11 DIAGNOSIS — Z79818 Long term (current) use of other agents affecting estrogen receptors and estrogen levels: Secondary | ICD-10-CM | POA: Diagnosis not present

## 2015-08-11 DIAGNOSIS — Z006 Encounter for examination for normal comparison and control in clinical research program: Secondary | ICD-10-CM | POA: Diagnosis not present

## 2015-08-11 DIAGNOSIS — Z192 Hormone resistant malignancy status: Secondary | ICD-10-CM | POA: Diagnosis not present

## 2015-08-11 DIAGNOSIS — C7951 Secondary malignant neoplasm of bone: Secondary | ICD-10-CM | POA: Diagnosis not present

## 2015-08-11 MED ORDER — TRIPTORELIN PAMOATE 22.5 MG IM SUSR: 22.5000 mg | INTRAMUSCULAR | Status: DC

## 2015-08-11 MED ORDER — TRIPTORELIN PAMOATE 22.5 MG IM SUSR
22.5000 mg | Freq: Once | INTRAMUSCULAR | Status: AC
  Administered 2015-08-11: 22.5 mg via INTRAMUSCULAR
  Filled 2015-08-11: qty 22.5

## 2015-08-11 MED ORDER — DENOSUMAB 120 MG/1.7ML ~~LOC~~ SOLN
120.0000 mg | Freq: Once | SUBCUTANEOUS | Status: AC
  Administered 2015-08-11: 120 mg via SUBCUTANEOUS
  Filled 2015-08-11: qty 1.7

## 2015-09-01 ENCOUNTER — Inpatient Hospital Stay (HOSPITAL_BASED_OUTPATIENT_CLINIC_OR_DEPARTMENT_OTHER): Payer: Medicare Other | Admitting: Oncology

## 2015-09-01 ENCOUNTER — Inpatient Hospital Stay: Payer: Medicare Other | Attending: Oncology

## 2015-09-01 ENCOUNTER — Encounter: Payer: Self-pay | Admitting: Oncology

## 2015-09-01 VITALS — BP 127/79 | HR 60 | Temp 96.4°F | Resp 18 | Wt 162.0 lb

## 2015-09-01 DIAGNOSIS — C7951 Secondary malignant neoplasm of bone: Secondary | ICD-10-CM

## 2015-09-01 DIAGNOSIS — I712 Thoracic aortic aneurysm, without rupture: Secondary | ICD-10-CM | POA: Insufficient documentation

## 2015-09-01 DIAGNOSIS — R188 Other ascites: Secondary | ICD-10-CM | POA: Insufficient documentation

## 2015-09-01 DIAGNOSIS — Z79818 Long term (current) use of other agents affecting estrogen receptors and estrogen levels: Secondary | ICD-10-CM | POA: Diagnosis not present

## 2015-09-01 DIAGNOSIS — C61 Malignant neoplasm of prostate: Secondary | ICD-10-CM

## 2015-09-01 DIAGNOSIS — Z006 Encounter for examination for normal comparison and control in clinical research program: Secondary | ICD-10-CM

## 2015-09-01 LAB — CBC WITH DIFFERENTIAL/PLATELET
Basophils Absolute: 0 10*3/uL (ref 0–0.1)
Basophils Relative: 1 %
EOS ABS: 0.1 10*3/uL (ref 0–0.7)
EOS PCT: 2 %
HCT: 40 % (ref 40.0–52.0)
Hemoglobin: 13.8 g/dL (ref 13.0–18.0)
LYMPHS ABS: 1.3 10*3/uL (ref 1.0–3.6)
Lymphocytes Relative: 34 %
MCH: 33.4 pg (ref 26.0–34.0)
MCHC: 34.4 g/dL (ref 32.0–36.0)
MCV: 97.2 fL (ref 80.0–100.0)
Monocytes Absolute: 0.5 10*3/uL (ref 0.2–1.0)
Monocytes Relative: 14 %
Neutro Abs: 1.9 10*3/uL (ref 1.4–6.5)
Neutrophils Relative %: 49 %
PLATELETS: 178 10*3/uL (ref 150–440)
RBC: 4.12 MIL/uL — AB (ref 4.40–5.90)
RDW: 13.3 % (ref 11.5–14.5)
WBC: 3.8 10*3/uL (ref 3.8–10.6)

## 2015-09-01 LAB — COMPREHENSIVE METABOLIC PANEL
ALT: 11 U/L — ABNORMAL LOW (ref 17–63)
ANION GAP: 6 (ref 5–15)
AST: 13 U/L — ABNORMAL LOW (ref 15–41)
Albumin: 4.2 g/dL (ref 3.5–5.0)
Alkaline Phosphatase: 50 U/L (ref 38–126)
BUN: 19 mg/dL (ref 6–20)
CHLORIDE: 105 mmol/L (ref 101–111)
CO2: 28 mmol/L (ref 22–32)
Calcium: 9.2 mg/dL (ref 8.9–10.3)
Creatinine, Ser: 0.95 mg/dL (ref 0.61–1.24)
GFR calc non Af Amer: >60 mL/min (ref >60)
Glucose, Bld: 101 mg/dL — ABNORMAL HIGH (ref 65–99)
POTASSIUM: 4 mmol/L (ref 3.5–5.1)
SODIUM: 139 mmol/L (ref 135–145)
Total Bilirubin: 0.8 mg/dL (ref 0.3–1.2)
Total Protein: 6.9 g/dL (ref 6.5–8.1)

## 2015-09-01 LAB — PSA: PSA: 18.52 ng/mL — ABNORMAL HIGH (ref 0.00–4.00)

## 2015-09-01 NOTE — Progress Notes (Signed)
Patient returns today for consideration of Cycle 11 on the [facility] [phone] research study with Enzalutamide/Abiraterone and Prednisone. Patient reports pain in right upper arm with no change since last visit on 08/04/2015. He reports that he is using Essential Oils to help with pain. Dr. Oliva Bustard assessed patient today and discussed possibility of bursitis in right arm. Patient also reports some fatigue but no change since last visit on 08/04/2015. He also reports hot flashes that occur both day and night but especially at night.  CBC and chemistries all within normal parameters and Dr. Oliva Bustard reviewed with patient. The patient returned medication calendars as well as his pill bottle for Enzalutamide with 4 pills remaining in the bottle which was returned to pharmacy. Gave patient new med calendar and new prescription of Enzalutamide with 120 tablets. At today's visit patient has gained 2 lbs since last visit on 08/04/2015.  He is scheduled to return next week on September 08, 2015 for Xgeva injection. He will return to clinic in four weeks to see MD and get labs on September 29, 2015.   AE's and attributions are below:                             Fatigue - grade 1; definitely related to study drug Hot flashes - grade 1; unlikely related to study drug Hemorrhoids - grade 1; unrelated Weight loss - grade 1; probably related Arm pain- grade 1; not related to study drug

## 2015-09-01 NOTE — Progress Notes (Signed)
[place] @ [facility] [phone]  Fax:([phone] [place]: 04-27-1951  MR#: [id number]  WPY#:[id number]  Patient Care Team: Provider Not In System as PCP - General Royston Cowper, MD (Urology)  CHIEF COMPLAINT:  Chief Complaint  Patient presents with  . 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 Patient was on androgen deprivation therapy as well as Casodex.  Rising tumor marker.  Bone scan as well as CT scan was positive for extensive metastases Patient is off Casodex approximately 4 weeks ago Patient is on Alliance protocol HPI:   65 year old gentleman with stage IV castration resistant prostate cancer  Oncology History   carcinoma  of prostatestage IIc, and  T2 N0 M0 diagnosis in February of 2014 patient had PSA of 18 patient antiandrogentherapyand therapy.  According to him in May of 2015 patient had PSA of 90 was started on androgen deprivation therapy any dropped to 20.  And then 18 later on.  Now PSA is rising againto 26 documented on October 03 2014.  Patient's bone scan and CT revealed extensive metastasis Patient had received Casodex and Trelstar.  2.  Castration resistant prostate cancer.  Patient was started on now ZYTIGA plus minus XTANDI in June of 2016       Cancer of prostate [facility])   08/17/2012 Initial Diagnosis Cancer of prostate, stage II   12/03/2013 Progression    10/13/2014 Progression     Oncology Flowsheet 03/25/2015 04/22/2015 05/19/2015 05/20/2015 06/17/2015 07/15/2015 08/11/2015  denosumab (XGEVA) Triumph 120 mg 120 mg 120 mg - 120 mg 120 mg 120 mg  diphenhydrAMINE (BENADRYL) PO - - - 25 mg - - -  promethazine (PHENERGAN) IM - - - 25 mg - - -  Triptorelin Pamoate (TRELSTAR) IM - - - - - - 22.5 mg  Zoledronic Acid (ZOMETA) IV - - - - - - -    INTERVAL HISTORY:  Patient is on Alliance protocol receiving ZYTIGA plus minus XTANDI.  Abdominal distention and abdominal pain is improved.  No  nausea no vomiting.  Appetite is improving.  No bony pains Patient is here for ongoing evaluation.  As occasional hematuria patient has a previous history of multiple kidney stones.  Recently passing stone.  Back pain is improved. Patient is on protocol also receiving   XGEVA Patient is here for ongoing evaluation and treatment consideration. He has undergone treatment for nephrolithiasis.  No bony pain appetite has been fairly good.  No chills.  No fever.  PSA remained stable. Patient was asked to continue Trelstar at [place] by urologist office.   Patient is here for further follow-up and continuation of protocol therapy. NO  Bony pains appetite has been fairly good and her nausea no vomiting no diarrhea.   REVIEW OF SYSTEMS:   GENERAL:  Feels good.  Active.  No fevers, sweats or weight loss. PERFORMANCE STATUS (ECOG): 0 HEENT:  No visual changes, runny nose, sore throat, mouth sores or tenderness. Lungs: No shortness of breath or cough.  No hemoptysis. Cardiac:  No chest pain, palpitations, orthopnea, or PND. GI:  No nausea, vomiting, diarrhea, constipation, melena or hematochezia. GU:  No urgency, frequency, dysuria, or hematuria. Musculoskeletal:  No back pain.  No joint pain.  No muscle tenderness. Extremities:  No pain or swelling. Skin:  No rashes or skin changes. Neuro:  No headache, numbness or weakness, balance or coordination issues. Endocrine:  No diabetes, thyroid issues, hot flashes  or night sweats. Psych:  No mood changes, depression or anxiety. Pain:  No focal pain. Review of systems:  All other systems reviewed and found to be negative. As per HPI. Otherwise, a complete review of systems is negatve.  PAST MEDICAL HISTORY: Past Medical History  Diagnosis Date  . Prostate cancer ([place])   . Cancer of prostate ([place]) 11/18/2014    Significant History/PMH:   Prostate Cancer:   Preventive Screening:  Has patient had any of the following test? Colonscopy   Prostate Exam (1)   Last Colonoscopy: October 2013(1)   Last Prostate Exam: 2015(1)   Smoking History: Smoking History Never Smoked.(1)  PFSH: Comments: no significant family history of any malignancy.  Social History: negative alcohol, negative tobacco  Additional Past Medical and Surgical History: no significant history of hypertension or diabetes or coronary artery disease   ADVANCED DIRECTIVES:  Patient does have advance healthcare directive, Patient   does not desire to make any changes HEALTH MAINTENANCE: Social History  Substance Use Topics  . Smoking status: Never Smoker   . Smokeless tobacco: None  . Alcohol Use: No      Allergies  Allergen Reactions  . No Known Allergies     Current Outpatient Prescriptions  Medication Sig Dispense Refill  . abiraterone Acetate (ZYTIGA) 250 MG tablet Take 4 tablets (1,000 mg total) by mouth daily. Take on an empty stomach 1 hour before or 2 hours after a meal 120 tablet 0  . abiraterone Acetate (ZYTIGA) 250 MG tablet Take 4 tablets (1,000 mg total) by mouth daily. Take on an empty stomach 1 hour before or 2 hours after a meal 120 tablet 11  . fluticasone (FLONASE) 50 MCG/ACT nasal spray Place 1 spray into both nostrils daily. 16 g 2  . hydrocortisone (ANUSOL-HC) 25 MG suppository Place 1 suppository (25 mg total) rectally 2 (two) times daily as needed for hemorrhoids or itching. 12 suppository 0  . Investigational enzalutamide 40 MG capsule ALLIANCE [id number] Take 4 capsules (160 mg total) by mouth daily. Take with or without food.Swallow whole. Do not crush, or open capsules. 120 capsule 0  . NUCYNTA 50 MG TABS tablet Take 1 tablet (50 mg total) by mouth every 6 (six) hours as needed for moderate pain. 1 TO 2 TABS Q 6 HOURS PRN PAIN 30 tablet 0  . ondansetron (ZOFRAN ODT) 8 MG disintegrating tablet Take 1 tablet (8 mg total) by mouth every 6 (six) hours as needed for nausea or vomiting. 10 tablet 3  . OxyCODONE HCl, Abuse Deter,  (OXAYDO) 5 MG TABA Take 1 tablet by mouth every 4 (four) hours as needed. 30 tablet 0  . predniSONE (DELTASONE) 5 MG tablet Take 1 tablet (5 mg total) by mouth 2 (two) times daily with a meal. 60 tablet 6  . tamsulosin (FLOMAX) 0.4 MG CAPS capsule Take 1 capsule (0.4 mg total) by mouth daily. 30 capsule 11  . Triptorelin Pamoate (TRELSTAR) 22.5 MG injection Inject 22.5 mg into the muscle every 6 (six) months.    . Triptorelin Pamoate (TRELSTAR) 22.5 MG injection Inject 22.5 mg into the muscle every 6 (six) months. 1 each 1   No current facility-administered medications for this visit.    OBJECTIVE:  Filed Vitals:   09/01/15 1034  BP: 127/79  Pulse: 60  Temp: 96.4 F (35.8 C)  Resp: 18     Body mass index is 23.25 kg/(m^2).    ECOG FS:0 - Asymptomatic  PHYSICAL EXAM: GENERAL:  Well developed,  well nourished, sitting comfortably in the exam room in no acute distress. MENTAL STATUS:  Alert and oriented to person, place and time. HEAD:   hair.  Normocephalic, atraumatic, face symmetric, no Cushingoid features. EYES:    Pupils equal round and reactive to light and accomodation.  No conjunctivitis or scleral icterus. ENT:  Oropharynx clear without lesion.  Tongue normal. Mucous membranes moist.  RESPIRATORY:  Clear to auscultation without rales, wheezes or rhonchi. CARDIOVASCULAR:  Regular rate and rhythm without murmur, rub or gallop. . ABDOMEN:  Soft, non-tender, with active bowel sounds, and no hepatosplenomegaly.  No masses. BACK:  No CVA tenderness.  No tenderness on percussion of the back or rib cage. SKIN:  No rashes, ulcers or lesions. EXTREMITIES: No edema, no skin discoloration or tenderness.  No palpable cords. LYMPH NODES: No palpable cervical, supraclavicular, axillary or inguinal adenopathy  NEUROLOGICAL: Unremarkable. PSYCH:  Appropriate.   LAB RESULTS:  Appointment on 09/01/2015  Component Date Value Ref Range Status  . WBC 09/01/2015 3.8  3.8 - 10.6 K/uL Final  .  RBC 09/01/2015 4.12* 4.40 - 5.90 MIL/uL Final  . Hemoglobin 09/01/2015 13.8  13.0 - 18.0 g/dL Final  . HCT 09/01/2015 40.0  40.0 - 52.0 % Final  . MCV 09/01/2015 97.2  80.0 - 100.0 fL Final  . MCH 09/01/2015 33.4  26.0 - 34.0 pg Final  . MCHC 09/01/2015 34.4  32.0 - 36.0 g/dL Final  . RDW 09/01/2015 13.3  11.5 - 14.5 % Final  . Platelets 09/01/2015 178  150 - 440 K/uL Final  . Neutrophils Relative % 09/01/2015 49   Final  . Neutro Abs 09/01/2015 1.9  1.4 - 6.5 K/uL Final  . Lymphocytes Relative 09/01/2015 34   Final  . Lymphs Abs 09/01/2015 1.3  1.0 - 3.6 K/uL Final  . Monocytes Relative 09/01/2015 14   Final  . Monocytes Absolute 09/01/2015 0.5  0.2 - 1.0 K/uL Final  . Eosinophils Relative 09/01/2015 2   Final  . Eosinophils Absolute 09/01/2015 0.1  0 - 0.7 K/uL Final  . Basophils Relative 09/01/2015 1   Final  . Basophils Absolute 09/01/2015 0.0  0 - 0.1 K/uL Final  . Sodium 09/01/2015 139  135 - 145 mmol/L Final  . Potassium 09/01/2015 4.0  3.5 - 5.1 mmol/L Final  . Chloride 09/01/2015 105  101 - 111 mmol/L Final  . CO2 09/01/2015 28  22 - 32 mmol/L Final  . Glucose, Bld 09/01/2015 101* 65 - 99 mg/dL Final  . BUN 09/01/2015 19  6 - 20 mg/dL Final  . Creatinine, Ser 09/01/2015 0.95  0.61 - 1.24 mg/dL Final  . Calcium 09/01/2015 9.2  8.9 - 10.3 mg/dL Final  . Total Protein 09/01/2015 6.9  6.5 - 8.1 g/dL Final  . Albumin 09/01/2015 4.2  3.5 - 5.0 g/dL Final  . AST 09/01/2015 13* 15 - 41 U/L Final  . ALT 09/01/2015 11* 17 - 63 U/L Final  . Alkaline Phosphatase 09/01/2015 50  38 - 126 U/L Final  . Total Bilirubin 09/01/2015 0.8  0.3 - 1.2 mg/dL Final  . GFR calc non Af Amer 09/01/2015 >60  >60 mL/min Final  . GFR calc Af Amer 09/01/2015 >60  >60 mL/min Final   Comment: (NOTE) The eGFR has been calculated using the CKD EPI equation. This calculation has not been validated in all clinical situations. eGFR's persistently <60 mL/min signify possible Chronic Kidney Disease.   . Anion  gap 09/01/2015 6  5 - 15  Final     ago  62moago        PSA 0.00 - 4.00 ng/mL 20.91 (H) 37.45 (H)CM 21.38 (H)CM        {   ASSESSMENT: Castration resistant prostate cancer STAGE iv DISEASE. On Alliance protocol with ascites plus minus XTANDI  Tolerating treatment very well.  As per PSA criteria PSA has dropped again from previous slightly elevated level .Clinically  Bony pains have improved. CJenny Reichmannis due for CT scan and bone scan in April per protocol.   MEDICAL DECISION MAKING:  All lab data has been reviewed and reviewed Discussed situation with protocol nurses PSA   is a pending  All medications have been reviewed XGEVA has been ordered for next week  Calcium level is within acceptable range patient is taking calcium tablet.  continue present [place] ZYTIGA.  Continue our  Protocol PSA remains stable Previous bone scan also has been reviewed and shows stable disease Patient has been referred to urologist Patient will continue androgen deprivation therapy with urologist Continue XGillermina Phyand ZYTIGA as per protocol Patient has been scheduled for complete bone scan and CT scan on January 17 which will be reviewed independently All of the lab data has been reviewed Discussed situation with research nurses and reviewed all the records from the research staff CT scan has been reviewed independently shows partial response.  Patient also has thoracic aortic aneurysm of ascending aorta and will make arrangements for him to be evaluated by cardiothoracic surgeon has recommended.. follow-up as recommended by protocol nurses, discussed with the protocol nurses. Also  discussed the findings with the radiologist.  Guarding ascending thoracic aneurysm  JCC 7th Edition     Clinical: Stage IV (T2, M1) - Signed by LEvlyn Kanner NP on 01/07/2015   JForest Gleason MD   09/01/2015 11:33 AM

## 2015-09-07 ENCOUNTER — Other Ambulatory Visit: Payer: Self-pay | Admitting: *Deleted

## 2015-09-07 DIAGNOSIS — C61 Malignant neoplasm of prostate: Secondary | ICD-10-CM

## 2015-09-07 MED ORDER — ABIRATERONE ACETATE 250 MG PO TABS: 1000.0000 mg | ORAL_TABLET | Freq: Every day | ORAL | Status: DC

## 2015-09-07 NOTE — Telephone Encounter (Signed)
E scribed per VO Dr Choksi 

## 2015-09-08 ENCOUNTER — Inpatient Hospital Stay: Payer: Medicare Other

## 2015-09-08 DIAGNOSIS — Z79818 Long term (current) use of other agents affecting estrogen receptors and estrogen levels: Secondary | ICD-10-CM | POA: Diagnosis not present

## 2015-09-08 DIAGNOSIS — C7951 Secondary malignant neoplasm of bone: Secondary | ICD-10-CM | POA: Diagnosis not present

## 2015-09-08 DIAGNOSIS — R188 Other ascites: Secondary | ICD-10-CM | POA: Diagnosis not present

## 2015-09-08 DIAGNOSIS — C61 Malignant neoplasm of prostate: Secondary | ICD-10-CM | POA: Diagnosis not present

## 2015-09-08 DIAGNOSIS — I712 Thoracic aortic aneurysm, without rupture: Secondary | ICD-10-CM | POA: Diagnosis not present

## 2015-09-08 DIAGNOSIS — Z006 Encounter for examination for normal comparison and control in clinical research program: Secondary | ICD-10-CM | POA: Diagnosis not present

## 2015-09-08 MED ORDER — DENOSUMAB 120 MG/1.7ML ~~LOC~~ SOLN
120.0000 mg | Freq: Once | SUBCUTANEOUS | Status: AC
  Administered 2015-09-08: 120 mg via SUBCUTANEOUS
  Filled 2015-09-08: qty 1.7

## 2015-09-29 ENCOUNTER — Inpatient Hospital Stay: Payer: Medicare Other | Attending: Oncology

## 2015-09-29 ENCOUNTER — Encounter: Payer: Self-pay | Admitting: Oncology

## 2015-09-29 ENCOUNTER — Inpatient Hospital Stay (HOSPITAL_BASED_OUTPATIENT_CLINIC_OR_DEPARTMENT_OTHER): Payer: Medicare Other | Admitting: Oncology

## 2015-09-29 ENCOUNTER — Encounter: Payer: Self-pay | Admitting: *Deleted

## 2015-09-29 VITALS — BP 137/84 | HR 72 | Temp 96.1°F | Resp 18 | Wt 159.2 lb

## 2015-09-29 DIAGNOSIS — R188 Other ascites: Secondary | ICD-10-CM | POA: Diagnosis not present

## 2015-09-29 DIAGNOSIS — C61 Malignant neoplasm of prostate: Secondary | ICD-10-CM | POA: Diagnosis not present

## 2015-09-29 DIAGNOSIS — Z006 Encounter for examination for normal comparison and control in clinical research program: Secondary | ICD-10-CM | POA: Diagnosis not present

## 2015-09-29 DIAGNOSIS — Z7952 Long term (current) use of systemic steroids: Secondary | ICD-10-CM | POA: Insufficient documentation

## 2015-09-29 DIAGNOSIS — Z79899 Other long term (current) drug therapy: Secondary | ICD-10-CM

## 2015-09-29 DIAGNOSIS — C7951 Secondary malignant neoplasm of bone: Secondary | ICD-10-CM | POA: Insufficient documentation

## 2015-09-29 DIAGNOSIS — R319 Hematuria, unspecified: Secondary | ICD-10-CM

## 2015-09-29 DIAGNOSIS — N200 Calculus of kidney: Secondary | ICD-10-CM

## 2015-09-29 LAB — CBC WITH DIFFERENTIAL/PLATELET
BASOS PCT: 1 %
Basophils Absolute: 0 10*3/uL (ref 0–0.1)
Eosinophils Absolute: 0 10*3/uL (ref 0–0.7)
Eosinophils Relative: 1 %
HEMATOCRIT: 40.6 % (ref 40.0–52.0)
HEMOGLOBIN: 14.2 g/dL (ref 13.0–18.0)
LYMPHS ABS: 1.5 10*3/uL (ref 1.0–3.6)
LYMPHS PCT: 38 %
MCH: 33.6 pg (ref 26.0–34.0)
MCHC: 35.1 g/dL (ref 32.0–36.0)
MCV: 95.8 fL (ref 80.0–100.0)
MONOS PCT: 13 %
Monocytes Absolute: 0.5 10*3/uL (ref 0.2–1.0)
NEUTROS ABS: 1.8 10*3/uL (ref 1.4–6.5)
NEUTROS PCT: 47 %
Platelets: 185 10*3/uL (ref 150–440)
RBC: 4.24 MIL/uL — ABNORMAL LOW (ref 4.40–5.90)
RDW: 13 % (ref 11.5–14.5)
WBC: 3.8 10*3/uL (ref 3.8–10.6)

## 2015-09-29 LAB — COMPREHENSIVE METABOLIC PANEL
ALBUMIN: 4.3 g/dL (ref 3.5–5.0)
ALK PHOS: 69 U/L (ref 38–126)
ALT: 11 U/L — ABNORMAL LOW (ref 17–63)
ANION GAP: 5 (ref 5–15)
AST: 16 U/L (ref 15–41)
BILIRUBIN TOTAL: 0.8 mg/dL (ref 0.3–1.2)
BUN: 18 mg/dL (ref 6–20)
CALCIUM: 9.1 mg/dL (ref 8.9–10.3)
CO2: 31 mmol/L (ref 22–32)
Chloride: 100 mmol/L — ABNORMAL LOW (ref 101–111)
Creatinine, Ser: 0.97 mg/dL (ref 0.61–1.24)
GFR calc Af Amer: >60 mL/min (ref >60)
GFR calc non Af Amer: >60 mL/min (ref >60)
GLUCOSE: 91 mg/dL (ref 65–99)
Potassium: 3.6 mmol/L (ref 3.5–5.1)
Sodium: 136 mmol/L (ref 135–145)
TOTAL PROTEIN: 7.6 g/dL (ref 6.5–8.1)

## 2015-09-29 LAB — PSA: PSA: 17.28 ng/mL — ABNORMAL HIGH (ref 0.00–4.00)

## 2015-09-29 MED ORDER — INV-ENZALUTAMIDE 40 MG CAPS #120 ALLIANCE A031201: 160.0000 mg | ORAL_CAPSULE | Freq: Every day | ORAL | Status: DC

## 2015-09-29 MED ORDER — PREDNISONE 5 MG PO TABS: 5.0000 mg | ORAL_TABLET | Freq: Two times a day (BID) | ORAL | Status: DC

## 2015-09-29 MED ORDER — ABIRATERONE ACETATE 250 MG PO TABS: 1000.0000 mg | ORAL_TABLET | Freq: Every day | ORAL | Status: DC

## 2015-09-29 NOTE — Progress Notes (Signed)
William Wright returns to clinic this morning for consideration of cycle 12 treatment with enzalutamide, abiraterone and prednisone on Alliance [phone] study. He returned his medication calendars and his bottle of Enzalutamide, but he did not bring in his Abiraterone this visit. Enzalutamide bottle has only 4 caps left, and when was patient questioned about this, states he must have left one dose in his pill box at home. William Wright states he has been doing well but does continue to experience some arthralgia in his shoulder, however now he states he is feeling the pain in both shoulders rather than just the right one. He also reports some occasional pain in his right upper arm which he describes as aching for awhile, then going away. He's not sure if it is bone related, but states it does not feel like muscle pain either. Dr. Oliva Bustard in to evaluate patient. Lab values reviewed and well within parameters for patient to proceed with cycle 12. New medication calendars provided to patient and pharmacy dispensed Enzalutamide 40mg  caps #120 to patient. Old bottle returned to pharmacy in exchange. William Wright was reminded to bring in his abiraterone next visit as well in order to ensure he is receiving and taking this medication. Patient scheduled to return to clinic next week for Xgeva injection. CT and bone scan were scheduled for 10/20/15; prior to next clinic visit on 10/27/15. Adverse events assessed as follows:    William Wright [id number]  09/29/2015  Adverse Event Log  Study/Protocol: Alliance [id number] Cycle: 12  Event Grade Onset Date Resolved Date Drug Name Attribution Treatment Comments  Fatigue Grade 1 11/25/14  Enzalutamide Probably    Hot Flashes Grade 1 11/25/14   Unrelated  r/t HST  Weight loss Grade 1 06/09/15  Enzalutamide + Abiraterone Possible    Arm pain (bone) Grade 1   Enzalutamide Possible  May be r/t bone mets  Arthralgias Grade 1   Enzalutamide Possible  Known AE but also had  this at baseline   [name], BSN, MHA, OCN 09/29/2015 9:50 AM

## 2015-09-30 ENCOUNTER — Encounter: Payer: Self-pay | Admitting: Oncology

## 2015-09-30 NOTE — Progress Notes (Signed)
[place] @ [facility] [phone]  Fax:([phone] [place]: 02-16-1951  MR#: [id number]  UKG#:[id number]  Patient Care Team: Provider Not In System as PCP - General Royston Cowper, MD (Urology)  CHIEF COMPLAINT:  Chief Complaint  Patient presents with  . 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 Patient was on androgen deprivation therapy as well as Casodex.  Rising tumor marker.  Bone scan as well as CT scan was positive for extensive metastases Patient is off Casodex approximately 4 weeks ago Patient is on Alliance protocol HPI:   65 year old gentleman with stage IV castration resistant prostate cancer  Oncology History   carcinoma  of prostatestage IIc, and  T2 N0 M0 diagnosis in February of 2014 patient had PSA of 18 patient antiandrogentherapyand therapy.  According to him in May of 2015 patient had PSA of 90 was started on androgen deprivation therapy any dropped to 20.  And then 18 later on.  Now PSA is rising againto 26 documented on October 03 2014.  Patient's bone scan and CT revealed extensive metastasis Patient had received Casodex and Trelstar.  2.  Castration resistant prostate cancer.  Patient was started on now ZYTIGA plus minus XTANDI in June of 2016       Cancer of prostate [facility])   08/17/2012 Initial Diagnosis Cancer of prostate, stage II   12/03/2013 Progression    10/13/2014 Progression     Oncology Flowsheet 04/22/2015 05/19/2015 05/20/2015 06/17/2015 07/15/2015 08/11/2015 09/08/2015  denosumab (XGEVA) [place] 120 mg 120 mg - 120 mg 120 mg 120 mg 120 mg  diphenhydrAMINE (BENADRYL) PO - - 25 mg - - - -  promethazine (PHENERGAN) IM - - 25 mg - - - -  Triptorelin Pamoate (TRELSTAR) IM - - - - - 22.5 mg -  Zoledronic Acid (ZOMETA) IV - - - - - - -    INTERVAL HISTORY:  Patient is on Alliance protocol receiving ZYTIGA plus minus XTANDI.  Abdominal distention and abdominal pain is improved.  No  nausea no vomiting.  Appetite is improving.  No bony pains Patient is here for ongoing evaluation.  As occasional hematuria patient has a previous history of multiple kidney stones.  Recently passing stone.  Back pain is improved. Patient is on protocol also receiving   XGEVA Patient is here for ongoing evaluation and treatment consideration. He has undergone treatment for nephrolithiasis.  No bony pain appetite has been fairly good.  No chills.  No fever.  PSA remained stable. Patient was asked to continue Trelstar at [place] by urologist office.   Patient is here for further follow-up and continuation of protocol therapy. NO  Bony pains appetite has been fairly good and her nausea no vomiting no diarrhea. Patient is here for further follow-up.  Patient is getting XGEVA   As well as trelstar  On alliance protocol   No bony pains.  Appetite has been stable.    REVIEW OF SYSTEMS:   GENERAL:  Feels good.  Active.  No fevers, sweats or weight loss. PERFORMANCE STATUS (ECOG): 0 HEENT:  No visual changes, runny nose, sore throat, mouth sores or tenderness. Lungs: No shortness of breath or cough.  No hemoptysis. Cardiac:  No chest pain, palpitations, orthopnea, or PND. GI:  No nausea, vomiting, diarrhea, constipation, melena or hematochezia. GU:  No urgency, frequency, dysuria, or hematuria. Musculoskeletal:  No back pain.  No joint pain.  No muscle tenderness.  Extremities:  No pain or swelling. Skin:  No rashes or skin changes. Neuro:  No headache, numbness or weakness, balance or coordination issues. Endocrine:  No diabetes, thyroid issues, hot flashes or night sweats. Psych:  No mood changes, depression or anxiety. Pain:  No focal pain. Review of systems:  All other systems reviewed and found to be negative. As per HPI. Otherwise, a complete review of systems is negatve.  PAST MEDICAL HISTORY: Past Medical History  Diagnosis Date  . Prostate cancer (HCC)   . Cancer of prostate  (HCC) 11/18/2014    Significant History/PMH:   Prostate Cancer:   Preventive Screening:  Has patient had any of the following test? Colonscopy  Prostate Exam (1)   Last Colonoscopy: October 2013(1)   Last Prostate Exam: 2015(1)   Smoking History: Smoking History Never Smoked.(1)  PFSH: Comments: no significant family history of any malignancy.  Social History: negative alcohol, negative tobacco  Additional Past Medical and Surgical History: no significant history of hypertension or diabetes or coronary artery disease   ADVANCED DIRECTIVES:  Patient does have advance healthcare directive, Patient   does not desire to make any changes HEALTH MAINTENANCE: Social History  Substance Use Topics  . Smoking status: Never Smoker   . Smokeless tobacco: None  . Alcohol Use: No      Allergies  Allergen Reactions  . No Known Allergies     Current Outpatient Prescriptions  Medication Sig Dispense Refill  . abiraterone Acetate (ZYTIGA) 250 MG tablet Take 4 tablets (1,000 mg total) by mouth daily. Take on an empty stomach 1 hour before or 2 hours after a meal 120 tablet 11  . fluticasone (FLONASE) 50 MCG/ACT nasal spray Place 1 spray into both nostrils daily. 16 g 2  . hydrocortisone (ANUSOL-HC) 25 MG suppository Place 1 suppository (25 mg total) rectally 2 (two) times daily as needed for hemorrhoids or itching. 12 suppository 0  . Investigational enzalutamide 40 MG capsule ALLIANCE [id number] Take 4 capsules (160 mg total) by mouth daily. Take with or without food.Swallow whole. Do not crush, or open capsules. 120 capsule 0  . NUCYNTA 50 MG TABS tablet Take 1 tablet (50 mg total) by mouth every 6 (six) hours as needed for moderate pain. 1 TO 2 TABS Q 6 HOURS PRN PAIN 30 tablet 0  . ondansetron (ZOFRAN ODT) 8 MG disintegrating tablet Take 1 tablet (8 mg total) by mouth every 6 (six) hours as needed for nausea or vomiting. 10 tablet 3  . OxyCODONE HCl, Abuse Deter, (OXAYDO) 5 MG TABA Take 1  tablet by mouth every 4 (four) hours as needed. 30 tablet 0  . predniSONE (DELTASONE) 5 MG tablet Take 1 tablet (5 mg total) by mouth 2 (two) times daily with a meal. 60 tablet 6  . tamsulosin (FLOMAX) 0.4 MG CAPS capsule Take 1 capsule (0.4 mg total) by mouth daily. 30 capsule 11  . Triptorelin Pamoate (TRELSTAR) 22.5 MG injection Inject 22.5 mg into the muscle every 6 (six) months.    . Triptorelin Pamoate (TRELSTAR) 22.5 MG injection Inject 22.5 mg into the muscle every 6 (six) months. 1 each 1  . abiraterone Acetate (ZYTIGA) 250 MG tablet Take 4 tablets (1,000 mg total) by mouth daily. Take on an empty stomach 1 hour before or 2 hours after a meal 120 tablet 0  . abiraterone Acetate (ZYTIGA) 250 MG tablet Take 4 tablets (1,000 mg total) by mouth daily. Take on an empty stomach 1 hour before or 2  hours after a meal 120 tablet 0  . Investigational enzalutamide 40 MG capsule ALLIANCE [id number] Take 4 capsules (160 mg total) by mouth daily. Take with or without food.Swallow whole. Do not crush, or open capsules. 120 capsule 0  . predniSONE (DELTASONE) 5 MG tablet Take 1 tablet (5 mg total) by mouth 2 (two) times daily. 60 tablet 0   No current facility-administered medications for this visit.    OBJECTIVE:  Filed Vitals:   09/29/15 0855  BP: 137/84  Pulse: 72  Temp: 96.1 F (35.6 C)  Resp: 18     Body mass index is 22.85 kg/(m^2).    ECOG FS:0 - Asymptomatic  PHYSICAL EXAM: GENERAL:  Well developed, well nourished, sitting comfortably in the exam room in no acute distress. MENTAL STATUS:  Alert and oriented to person, place and time. HEAD:   hair.  Normocephalic, atraumatic, face symmetric, no Cushingoid features. EYES:    Pupils equal round and reactive to light and accomodation.  No conjunctivitis or scleral icterus. ENT:  Oropharynx clear without lesion.  Tongue normal. Mucous membranes moist.  RESPIRATORY:  Clear to auscultation without rales, wheezes or rhonchi. CARDIOVASCULAR:   Regular rate and rhythm without murmur, rub or gallop. . ABDOMEN:  Soft, non-tender, with active bowel sounds, and no hepatosplenomegaly.  No masses. BACK:  No CVA tenderness.  No tenderness on percussion of the back or rib cage. SKIN:  No rashes, ulcers or lesions. EXTREMITIES: No edema, no skin discoloration or tenderness.  No palpable cords. LYMPH NODES: No palpable cervical, supraclavicular, axillary or inguinal adenopathy  NEUROLOGICAL: Unremarkable. PSYCH:  Appropriate.   LAB RESULTS:  Appointment on 09/29/2015  Component Date Value Ref Range Status  . WBC 09/29/2015 3.8  3.8 - 10.6 K/uL Final  . RBC 09/29/2015 4.24* 4.40 - 5.90 MIL/uL Final  . Hemoglobin 09/29/2015 14.2  13.0 - 18.0 g/dL Final  . HCT 09/29/2015 40.6  40.0 - 52.0 % Final  . MCV 09/29/2015 95.8  80.0 - 100.0 fL Final  . MCH 09/29/2015 33.6  26.0 - 34.0 pg Final  . MCHC 09/29/2015 35.1  32.0 - 36.0 g/dL Final  . RDW 09/29/2015 13.0  11.5 - 14.5 % Final  . Platelets 09/29/2015 185  150 - 440 K/uL Final  . Neutrophils Relative % 09/29/2015 47   Final  . Neutro Abs 09/29/2015 1.8  1.4 - 6.5 K/uL Final  . Lymphocytes Relative 09/29/2015 38   Final  . Lymphs Abs 09/29/2015 1.5  1.0 - 3.6 K/uL Final  . Monocytes Relative 09/29/2015 13   Final  . Monocytes Absolute 09/29/2015 0.5  0.2 - 1.0 K/uL Final  . Eosinophils Relative 09/29/2015 1   Final  . Eosinophils Absolute 09/29/2015 0.0  0 - 0.7 K/uL Final  . Basophils Relative 09/29/2015 1   Final  . Basophils Absolute 09/29/2015 0.0  0 - 0.1 K/uL Final  . Sodium 09/29/2015 136  135 - 145 mmol/L Final  . Potassium 09/29/2015 3.6  3.5 - 5.1 mmol/L Final  . Chloride 09/29/2015 100* 101 - 111 mmol/L Final  . CO2 09/29/2015 31  22 - 32 mmol/L Final  . Glucose, Bld 09/29/2015 91  65 - 99 mg/dL Final  . BUN 09/29/2015 18  6 - 20 mg/dL Final  . Creatinine, Ser 09/29/2015 0.97  0.61 - 1.24 mg/dL Final  . Calcium 09/29/2015 9.1  8.9 - 10.3 mg/dL Final  . Total Protein  09/29/2015 7.6  6.5 - 8.1 g/dL Final  . Albumin  09/29/2015 4.3  3.5 - 5.0 g/dL Final  . AST 09/29/2015 16  15 - 41 U/L Final  . ALT 09/29/2015 11* 17 - 63 U/L Final  . Alkaline Phosphatase 09/29/2015 69  38 - 126 U/L Final  . Total Bilirubin 09/29/2015 0.8  0.3 - 1.2 mg/dL Final  . GFR calc non Af Amer 09/29/2015 >60  >60 mL/min Final  . GFR calc Af Amer 09/29/2015 >60  >60 mL/min Final   Comment: (NOTE) The eGFR has been calculated using the CKD EPI equation. This calculation has not been validated in all clinical situations. eGFR's persistently <60 mL/min signify possible Chronic Kidney Disease.   . Anion gap 09/29/2015 5  5 - 15 Final  . PSA 09/29/2015 17.28* 0.00 - 4.00 ng/mL Final   Comment: (NOTE) While PSA levels of <=4.0 ng/ml are reported as reference range, some men with levels below 4.0 ng/ml can have prostate cancer and many men with PSA above 4.0 ng/ml do not have prostate cancer.  Other tests such as free PSA, age specific reference ranges, PSA velocity and PSA doubling time may be helpful especially in men less than 78 years old. Performed at [facility]      ago  47moago        PSA 0.00 - 4.00 ng/mL 20.91 (H) 37.45 (H)CM 21.38 (H)CM      PSA (in March of 2017) 17.28   {   ASSESSMENT: Castration resistant prostate cancer STAGE iv DISEASE. On Alliance protocol with ascites plus minus XTANDI  Tolerating treatment very well.  As per PSA criteria PSA has dropped again from previous slightly elevated level .Clinically  Bony pains have improved. CJenny Reichmannis due for CT scan and bone scan in April per protocol.   MEDICAL DECISION MAKING:  All lab data has been reviewed and reviewed Discussed situation with protocol nurses PSA   is a pending  All medications have been reviewed XGEVA has been ordered for next week  Calcium level is within acceptable range patient is taking calcium tablet.  continue present [place] ZYTIGA.  Continue our   Protocol PSA remains stable Previous bone scan also has been reviewed and shows stable disease Patient has been referred to urologist Patient will continue androgen deprivation therapy with urologist Continue XGillermina Phyand ZYTIGA as per protocol Discussed  situation with protocol.  All of that appointment has been planned.  Overall patient has stable disease.    other appointments have been made as per protocol requirement.  In my absence patient will be seen by my associate  JCC 7th Edition     Clinical: Stage IV (T2, M1) - Signed by LEvlyn Kanner NP on 01/07/2015   JForest Gleason MD   09/30/2015 4:01 PM

## 2015-10-06 ENCOUNTER — Inpatient Hospital Stay: Payer: Medicare Other

## 2015-10-06 DIAGNOSIS — C61 Malignant neoplasm of prostate: Secondary | ICD-10-CM | POA: Diagnosis not present

## 2015-10-06 DIAGNOSIS — Z79899 Other long term (current) drug therapy: Secondary | ICD-10-CM | POA: Diagnosis not present

## 2015-10-06 DIAGNOSIS — C7951 Secondary malignant neoplasm of bone: Secondary | ICD-10-CM | POA: Diagnosis not present

## 2015-10-06 DIAGNOSIS — Z7952 Long term (current) use of systemic steroids: Secondary | ICD-10-CM | POA: Diagnosis not present

## 2015-10-06 DIAGNOSIS — Z006 Encounter for examination for normal comparison and control in clinical research program: Secondary | ICD-10-CM | POA: Diagnosis not present

## 2015-10-06 DIAGNOSIS — R188 Other ascites: Secondary | ICD-10-CM | POA: Diagnosis not present

## 2015-10-06 MED ORDER — DENOSUMAB 120 MG/1.7ML ~~LOC~~ SOLN
120.0000 mg | Freq: Once | SUBCUTANEOUS | Status: AC
  Administered 2015-10-06: 120 mg via SUBCUTANEOUS
  Filled 2015-10-06: qty 1.7

## 2015-10-20 ENCOUNTER — Ambulatory Visit: Admission: RE | Admit: 2015-10-20 | Payer: Medicare Other | Source: Ambulatory Visit

## 2015-10-20 ENCOUNTER — Ambulatory Visit
Admission: RE | Admit: 2015-10-20 | Discharge: 2015-10-20 | Disposition: A | Discharge status: Home / Self Care | Payer: Medicare Other | Source: Ambulatory Visit | Attending: Oncology | Admitting: Oncology

## 2015-10-20 ENCOUNTER — Encounter
Admission: RE | Admit: 2015-10-20 | Discharge: 2015-10-20 | Disposition: A | Discharge status: Home / Self Care | Payer: Medicare Other | Source: Ambulatory Visit | Attending: Oncology | Admitting: Oncology

## 2015-10-20 DIAGNOSIS — R911 Solitary pulmonary nodule: Secondary | ICD-10-CM | POA: Insufficient documentation

## 2015-10-20 DIAGNOSIS — C7951 Secondary malignant neoplasm of bone: Secondary | ICD-10-CM | POA: Diagnosis not present

## 2015-10-20 DIAGNOSIS — C61 Malignant neoplasm of prostate: Secondary | ICD-10-CM | POA: Diagnosis not present

## 2015-10-20 MED ORDER — IOPAMIDOL (ISOVUE-300) INJECTION 61%
100.0000 mL | Freq: Once | INTRAVENOUS | Status: AC | PRN
  Administered 2015-10-20: 100 mL via INTRAVENOUS

## 2015-10-20 MED ORDER — TECHNETIUM TC 99M MEDRONATE IV KIT
25.0000 | PACK | Freq: Once | INTRAVENOUS | Status: AC | PRN
  Administered 2015-10-20: 22.48 via INTRAVENOUS

## 2015-10-21 ENCOUNTER — Telehealth: Payer: Self-pay | Admitting: *Deleted

## 2015-10-21 NOTE — Telephone Encounter (Signed)
Returned phone call to William Wright to discuss questions about his itemized bill. He had requested an itemized bill last week and when he received it, he thought maybe he had been charged for the study drug by mistake. Reviewed charges with patient and informed that the charge in question was for his Xgeva injection, and was not for the study drug, and that the charge was also dated on 09/08/15 - the date he received the Xgeva - and not on 09/01/15, when he received the study drug. Patient was satisfied with this response. He questioned his scan results and reviewed the CT results of target lesions that we are following that are stable per his CT scan. He specifically asked if the bone scan showed anything in his shoulder that is causing him pain, and pt was informed that there is a bone lesion there, but it has been there all along and is not new. He questioned whether there was any new disease that showed up and was informed that there was no new disease on either the CT or the bone scan. Patient is scheduled to come to clinic to see Dr. [name] next week. He questions what day he will need to come to clinic in July - which is currently scheduled for July 5th. Reports he has vacation the week of 01/22/16. [name], BSN, MHA, OCN 10/21/2015 12:08 PM

## 2015-10-27 ENCOUNTER — Encounter: Payer: Self-pay | Admitting: *Deleted

## 2015-10-27 ENCOUNTER — Inpatient Hospital Stay: Payer: Medicare Other | Attending: Internal Medicine | Admitting: Internal Medicine

## 2015-10-27 ENCOUNTER — Encounter: Payer: Self-pay | Admitting: Internal Medicine

## 2015-10-27 ENCOUNTER — Inpatient Hospital Stay: Payer: Medicare Other

## 2015-10-27 VITALS — BP 126/80 | HR 57 | Temp 96.8°F | Resp 18 | Wt 161.8 lb

## 2015-10-27 DIAGNOSIS — C61 Malignant neoplasm of prostate: Secondary | ICD-10-CM

## 2015-10-27 DIAGNOSIS — R972 Elevated prostate specific antigen [PSA]: Secondary | ICD-10-CM | POA: Insufficient documentation

## 2015-10-27 DIAGNOSIS — Z79818 Long term (current) use of other agents affecting estrogen receptors and estrogen levels: Secondary | ICD-10-CM | POA: Insufficient documentation

## 2015-10-27 DIAGNOSIS — Z79899 Other long term (current) drug therapy: Secondary | ICD-10-CM | POA: Insufficient documentation

## 2015-10-27 DIAGNOSIS — M25511 Pain in right shoulder: Secondary | ICD-10-CM | POA: Insufficient documentation

## 2015-10-27 DIAGNOSIS — Z006 Encounter for examination for normal comparison and control in clinical research program: Secondary | ICD-10-CM | POA: Insufficient documentation

## 2015-10-27 DIAGNOSIS — C7951 Secondary malignant neoplasm of bone: Secondary | ICD-10-CM | POA: Diagnosis not present

## 2015-10-27 DIAGNOSIS — Z7952 Long term (current) use of systemic steroids: Secondary | ICD-10-CM | POA: Insufficient documentation

## 2015-10-27 LAB — CBC WITH DIFFERENTIAL/PLATELET
Basophils Absolute: 0 10*3/uL (ref 0–0.1)
Basophils Relative: 1 %
EOS PCT: 2 %
Eosinophils Absolute: 0.1 10*3/uL (ref 0–0.7)
HEMATOCRIT: 38.9 % — AB (ref 40.0–52.0)
Hemoglobin: 13.6 g/dL (ref 13.0–18.0)
LYMPHS ABS: 1.3 10*3/uL (ref 1.0–3.6)
LYMPHS PCT: 30 %
MCH: 33.8 pg (ref 26.0–34.0)
MCHC: 35.1 g/dL (ref 32.0–36.0)
MCV: 96.5 fL (ref 80.0–100.0)
MONO ABS: 0.6 10*3/uL (ref 0.2–1.0)
Monocytes Relative: 13 %
Neutro Abs: 2.2 10*3/uL (ref 1.4–6.5)
Neutrophils Relative %: 54 %
PLATELETS: 184 10*3/uL (ref 150–440)
RBC: 4.03 MIL/uL — AB (ref 4.40–5.90)
RDW: 13.4 % (ref 11.5–14.5)
WBC: 4.2 10*3/uL (ref 3.8–10.6)

## 2015-10-27 LAB — COMPREHENSIVE METABOLIC PANEL
ALT: 10 U/L — AB (ref 17–63)
AST: 13 U/L — ABNORMAL LOW (ref 15–41)
Albumin: 4.1 g/dL (ref 3.5–5.0)
Alkaline Phosphatase: 58 U/L (ref 38–126)
Anion gap: 4 — ABNORMAL LOW (ref 5–15)
BILIRUBIN TOTAL: 0.8 mg/dL (ref 0.3–1.2)
BUN: 19 mg/dL (ref 6–20)
CHLORIDE: 103 mmol/L (ref 101–111)
CO2: 31 mmol/L (ref 22–32)
Calcium: 9.1 mg/dL (ref 8.9–10.3)
Creatinine, Ser: 1.07 mg/dL (ref 0.61–1.24)
Glucose, Bld: 97 mg/dL (ref 65–99)
Potassium: 4.3 mmol/L (ref 3.5–5.1)
Sodium: 138 mmol/L (ref 135–145)
Total Protein: 6.7 g/dL (ref 6.5–8.1)

## 2015-10-27 LAB — PSA: PSA: 17 ng/mL — AB (ref 0.00–4.00)

## 2015-10-27 MED ORDER — INV-ENZALUTAMIDE 40 MG CAPS #120 ALLIANCE A031201: 160.0000 mg | ORAL_CAPSULE | Freq: Every day | ORAL | Status: DC

## 2015-10-27 NOTE — Progress Notes (Signed)
William Wright returns to clinic this morning for consideration of Cycle 13 treatment with Enzalutamide, Abiraterone and Prednisone. He brought in supplement called Prosteon for Dr. [name] to review and he states this is OK for patient to take. Patient returned his completed medication calendars along with his bottle of Enzalutamide, but he did not bring in his Abiraterone or Prednisone this visit. Enzalutamide bottle has 8 caps left, and William Wright states [facility] will deliver the Abiraterone this week. He reports having enough Prednisone to last through Saturday and states he will get it refilled before he runs out of it. Instructed patient to call if he does not have refills for it. Dr. [name] in to evaluate patient, and he also reviewed recent CT and bone scan results - which showed stable disease. Lab values reviewed and well within parameters for patient to proceed with cycle 13. VS and weight are also stable. New medication calendars provided to patient and pharmacy dispensed Enzalutamide 40mg  caps #120 to patient. Old bottle containing 8 caps returned to pharmacy in exchange.  Patient states he has an appointment already to return to clinic next week for Xgeva injection. Will schedule return appt in 4 weeks to see Dr. Oliva Bustard. Adverse events are about the same and were assessed as follows:    [name] [id number]  10/27/2015  Adverse Event Log  Study/Protocol: Alliance [id number] Cycle: 12  Event Grade Onset Date Resolved Date Drug Name Attribution Treatment Comments  Fatigue Grade 1 11/25/14  Enzalutamide Probably    Hot Flashes Grade 1 11/25/14   Unrelated  r/t HST  Weight loss Grade 1 06/09/15  Enzalutamide + Abiraterone Possible    Arm pain (bone) Grade 1   Enzalutamide Possible  May be r/t bone mets   [name], BSN, MHA, OCN 10/27/2015 12:15 PM

## 2015-10-27 NOTE — Progress Notes (Signed)
[place] OFFICE PROGRESS NOTE  Patient Care Team: Provider Not In System as PCP - General Royston Cowper, MD (Urology)   SUMMARY OF ONCOLOGIC HISTORY:  # METASTATIC PROSTATE CANCER- CASTRATE RESISTANT- Alliance protocol with Trelstar with Fabio Asa plus  XTANDI   # Bony mets- on X-geva q 4 w  INTERVAL HISTORY:  65 year old male patient with above history of metastatic castrate resistant prostate cancer currently on /Alliance protocol with Trelstar with Zytiga plus  Gillermina Phy is here for follow-up/to review the results of the scans.  Patient denies any significant fatigue. Denies any bone pain. Complains of pain in the right arm most related to movement. Denies any swelling in the legs. Denies any shortness of breath or chest pain.  Denies any abdominal pain nausea vomiting.   REVIEW OF SYSTEMS:  A complete 10 point review of system is done which is negative except mentioned above/history of present illness.   PAST MEDICAL HISTORY :  Past Medical History  Diagnosis Date  . Prostate cancer ([place])   . Cancer of prostate ([place]) 11/18/2014    PAST SURGICAL HISTORY :   Past Surgical History  Procedure Laterality Date  . Extracorporeal shock wave lithotripsy Left 05/20/2015    Procedure: EXTRACORPOREAL SHOCK WAVE LITHOTRIPSY (ESWL);  Surgeon: Royston Cowper, MD;  Location: [facility] ORS;  Service: Urology;  Laterality: Left;    FAMILY HISTORY :  No family history on file.  SOCIAL HISTORY:   Social History  Substance Use Topics  . Smoking status: Never Smoker   . Smokeless tobacco: Not on file  . Alcohol Use: No    ALLERGIES:  is allergic to no known allergies.  MEDICATIONS:  Current Outpatient Prescriptions  Medication Sig Dispense Refill  . abiraterone Acetate (ZYTIGA) 250 MG tablet Take 4 tablets (1,000 mg total) by mouth daily. Take on an empty stomach 1 hour before or 2 hours after a meal 120 tablet 11  . abiraterone Acetate (ZYTIGA) 250 MG tablet Take 4 tablets  (1,000 mg total) by mouth daily. Take on an empty stomach 1 hour before or 2 hours after a meal 120 tablet 0  . abiraterone Acetate (ZYTIGA) 250 MG tablet Take 4 tablets (1,000 mg total) by mouth daily. Take on an empty stomach 1 hour before or 2 hours after a meal 120 tablet 0  . fluticasone (FLONASE) 50 MCG/ACT nasal spray Place 1 spray into both nostrils daily. 16 g 2  . hydrocortisone (ANUSOL-HC) 25 MG suppository Place 1 suppository (25 mg total) rectally 2 (two) times daily as needed for hemorrhoids or itching. 12 suppository 0  . Investigational enzalutamide 40 MG capsule ALLIANCE [id number] Take 4 capsules (160 mg total) by mouth daily. Take with or without food.Swallow whole. Do not crush, or open capsules. 120 capsule 0  . Investigational enzalutamide 40 MG capsule ALLIANCE [id number] Take 4 capsules (160 mg total) by mouth daily. Take with or without food.Swallow whole. Do not crush, or open capsules. 120 capsule 0  . NUCYNTA 50 MG TABS tablet Take 1 tablet (50 mg total) by mouth every 6 (six) hours as needed for moderate pain. 1 TO 2 TABS Q 6 HOURS PRN PAIN 30 tablet 0  . ondansetron (ZOFRAN ODT) 8 MG disintegrating tablet Take 1 tablet (8 mg total) by mouth every 6 (six) hours as needed for nausea or vomiting. 10 tablet 3  . OxyCODONE HCl, Abuse Deter, (OXAYDO) 5 MG TABA Take 1 tablet by mouth every 4 (four) hours as needed. [place]  tablet 0  . predniSONE (DELTASONE) 5 MG tablet Take 1 tablet (5 mg total) by mouth 2 (two) times daily with a meal. 60 tablet 6  . predniSONE (DELTASONE) 5 MG tablet Take 1 tablet (5 mg total) by mouth 2 (two) times daily. 60 tablet 0  . tamsulosin (FLOMAX) 0.4 MG CAPS capsule Take 1 capsule (0.4 mg total) by mouth daily. 30 capsule 11  . Triptorelin Pamoate (TRELSTAR) 22.5 MG injection Inject 22.5 mg into the muscle every 6 (six) months.    . Triptorelin Pamoate (TRELSTAR) 22.5 MG injection Inject 22.5 mg into the muscle every 6 (six) months. 1 each 1   No current  facility-administered medications for this visit.    PHYSICAL EXAMINATION: ECOG PERFORMANCE STATUS: 0 - Asymptomatic  BP 126/80 mmHg  Pulse 57  Temp(Src) 96.8 F (36 C) (Tympanic)  Resp 18  Wt 161 lb 13.1 oz (73.4 kg)  Filed Weights   10/27/15 1110  Weight: 161 lb 13.1 oz (73.4 kg)    GENERAL: Well-nourished well-developed; Alert, no distress and comfortable.  Alone. EYES: no pallor or icterus OROPHARYNX: no thrush or ulceration; good dentition  NECK: supple, no masses felt LYMPH:  no palpable lymphadenopathy in the cervical, axillary or inguinal regions LUNGS: clear to auscultation and  No wheeze or crackles HEART/CVS: regular rate & rhythm and no murmurs; No lower extremity edema ABDOMEN:abdomen soft, non-tender and normal bowel sounds Musculoskeletal:no cyanosis of digits and no clubbing  PSYCH: alert & oriented x 3 with fluent speech NEURO: no focal motor/sensory deficits SKIN:  no rashes or significant lesions  LABORATORY DATA:  I have reviewed the data as listed    Component Value Date/Time   NA 138 10/27/2015 1049   NA 137 11/12/2014 1415   K 4.3 10/27/2015 1049   K 4.1 11/12/2014 1415   CL 103 10/27/2015 1049   CL 105 11/12/2014 1415   CO2 31 10/27/2015 1049   CO2 24 11/12/2014 1415   GLUCOSE 97 10/27/2015 1049   GLUCOSE 98 11/12/2014 1415   BUN 19 10/27/2015 1049   BUN 24* 11/12/2014 1415   CREATININE 1.07 10/27/2015 1049   CREATININE 1.02 11/12/2014 1415   CALCIUM 9.1 10/27/2015 1049   CALCIUM 9.2 11/12/2014 1415   PROT 6.7 10/27/2015 1049   PROT 7.4 11/12/2014 1415   ALBUMIN 4.1 10/27/2015 1049   ALBUMIN 4.4 11/12/2014 1415   AST 13* 10/27/2015 1049   AST 23 11/12/2014 1415   ALT 10* 10/27/2015 1049   ALT 16* 11/12/2014 1415   ALKPHOS 58 10/27/2015 1049   ALKPHOS 103 11/12/2014 1415   BILITOT 0.8 10/27/2015 1049   BILITOT 0.4 11/12/2014 1415   GFRNONAA >60 10/27/2015 1049   GFRNONAA >60 11/12/2014 1415   GFRAA >60 10/27/2015 1049   GFRAA  >60 11/12/2014 1415    No results found for: SPEP, UPEP  Lab Results  Component Value Date   WBC 4.2 10/27/2015   NEUTROABS 2.2 10/27/2015   HGB 13.6 10/27/2015   HCT 38.9* 10/27/2015   MCV 96.5 10/27/2015   PLT 184 10/27/2015      Chemistry      Component Value Date/Time   NA 138 10/27/2015 1049   NA 137 11/12/2014 1415   K 4.3 10/27/2015 1049   K 4.1 11/12/2014 1415   CL 103 10/27/2015 1049   CL 105 11/12/2014 1415   CO2 31 10/27/2015 1049   CO2 24 11/12/2014 1415   BUN 19 10/27/2015 1049   BUN 24*  11/12/2014 1415   CREATININE 1.07 10/27/2015 1049   CREATININE 1.02 11/12/2014 1415      Component Value Date/Time   CALCIUM 9.1 10/27/2015 1049   CALCIUM 9.2 11/12/2014 1415   ALKPHOS 58 10/27/2015 1049   ALKPHOS 103 11/12/2014 1415   AST 13* 10/27/2015 1049   AST 23 11/12/2014 1415   ALT 10* 10/27/2015 1049   ALT 16* 11/12/2014 1415   BILITOT 0.8 10/27/2015 1049   BILITOT 0.4 11/12/2014 1415     Target Lesions:  1. Mediastinal right retrocrural lymph node: 1.6 x 0.7 cm today (image image 31 of series 2) compared to 2.6 x 0.9 cm cm previously. 2. Left para-aortic lymph node: 2.3 x 0.8 cm (image 67 of series 2) compared to 2.3 x 0.8 cm previously. 3. Lymph node superior to pancreas/celiac axis: 2.2 x 1.2 (image 60 of series 2) compared to 2.1 x 1.2 cm previously. 4. Portal caval lymph node: 1.6 x 0.9 cm today (image 61 series 2) compared to 1.5 x 1.0 cm previously.  IMPRESSION: 1. No evidence of new skeletal metastasis. 2. Stable disease within the spine, ribs, pelvis and shoulder girdle.    ASSESSMENT & PLAN:   # METASTATIC PROSTATE CANCER- CASTRATE RESISTANT- on Trelstar+ zytiaga+ prednisone 5 BID+ X-tandi. STABLE disease on scans.Patient's most recent PSA in March was 17/improving from 23 in January 2017.  Patient tolerating treatment very well without any side effects. Continue current herapy.   # Bony mets- on X-geva. No jaw issues.  # Right  shoulder pain- most likely arthritic pain in the shoulder joint. Patient's metastatic lesion in the right scapula I think this is asymptomatic.  # Patient will follow-up with Dr. Lilyan Gilford scheduled/ as per clinical trials nurse. Discussed with clinical trials nurse. I reviewed the results of the CAT scan and bone scan with the patient.     Cammie Sickle, MD 10/27/2015 11:37 AM

## 2015-10-29 ENCOUNTER — Telehealth: Payer: Self-pay

## 2015-10-29 NOTE — Telephone Encounter (Signed)
T/C from patient requesting PSA results. PSA was 17.00 and results were given to patient. Patient was encouraged that results were lower than last month. Will continue to monitor.

## 2015-11-03 ENCOUNTER — Inpatient Hospital Stay: Payer: Medicare Other

## 2015-11-03 DIAGNOSIS — Z7952 Long term (current) use of systemic steroids: Secondary | ICD-10-CM | POA: Diagnosis not present

## 2015-11-03 DIAGNOSIS — Z79818 Long term (current) use of other agents affecting estrogen receptors and estrogen levels: Secondary | ICD-10-CM | POA: Diagnosis not present

## 2015-11-03 DIAGNOSIS — C61 Malignant neoplasm of prostate: Secondary | ICD-10-CM | POA: Diagnosis not present

## 2015-11-03 DIAGNOSIS — Z006 Encounter for examination for normal comparison and control in clinical research program: Secondary | ICD-10-CM | POA: Diagnosis not present

## 2015-11-03 DIAGNOSIS — Z79899 Other long term (current) drug therapy: Secondary | ICD-10-CM | POA: Diagnosis not present

## 2015-11-03 DIAGNOSIS — C7951 Secondary malignant neoplasm of bone: Secondary | ICD-10-CM | POA: Diagnosis not present

## 2015-11-03 MED ORDER — DENOSUMAB 120 MG/1.7ML ~~LOC~~ SOLN
120.0000 mg | Freq: Once | SUBCUTANEOUS | Status: AC
  Administered 2015-11-03: 120 mg via SUBCUTANEOUS
  Filled 2015-11-03: qty 1.7

## 2015-11-22 ENCOUNTER — Telehealth: Payer: Self-pay | Admitting: *Deleted

## 2015-11-22 MED ORDER — VALACYCLOVIR HCL 1 G PO TABS: 1000.0000 mg | ORAL_TABLET | Freq: Two times a day (BID) | ORAL | Status: DC

## 2015-11-22 NOTE — Telephone Encounter (Signed)
Received t/c from patient reporting that he thinks he has shingles. States he started breaking out last Tuesday(11/16/15) and the rash goes across his chest and up toward his shoulder. States he has had minimal pain, but a good deal of itching, and now is beginning to crust over. William Wright reports he has had shingles in the past and this is similar. Reports he has never experienced a great deal of pain from shingles either now or previously. States he has been using cortisone cream since he broke out with them. Instructed that I will let the MD on call know about this and call back with instructions. All of above was reported to William Wright nurse [facility], and she states she will let William Nim, NP know about this in case the patient needs to have a prescription called in for it. [name], BSN, MHA, OCN 11/22/2015 10:23 AM  T/C made back to patient to inform that I have passed the information on the [place], and she is going to let William Wright know about it, and that he may still need to take medication for this. Instructed that we will call him back to let him know if William Wright calls in a prescription for him. [name], BSN, MHA, OCN 11/22/2015 10:49 AM

## 2015-11-22 NOTE — Telephone Encounter (Signed)
Per Georgeanne Nim, NP will start pt on valtrex 1g PO BID x 14 days and have Dr. Oliva Bustard evaluate rash at next appt on 5/10. Prescription has been sent to pharmacy. Pt aware and verbalized understanding.

## 2015-11-24 ENCOUNTER — Inpatient Hospital Stay: Payer: Medicare Other | Attending: Oncology | Admitting: Oncology

## 2015-11-24 ENCOUNTER — Encounter: Payer: Self-pay | Admitting: Oncology

## 2015-11-24 ENCOUNTER — Encounter: Payer: Self-pay | Admitting: *Deleted

## 2015-11-24 ENCOUNTER — Inpatient Hospital Stay: Payer: Medicare Other

## 2015-11-24 VITALS — BP 130/79 | HR 76 | Temp 96.4°F | Resp 18 | Wt 161.2 lb

## 2015-11-24 DIAGNOSIS — Z79818 Long term (current) use of other agents affecting estrogen receptors and estrogen levels: Secondary | ICD-10-CM | POA: Diagnosis not present

## 2015-11-24 DIAGNOSIS — Z79891 Long term (current) use of opiate analgesic: Secondary | ICD-10-CM | POA: Diagnosis not present

## 2015-11-24 DIAGNOSIS — Z79899 Other long term (current) drug therapy: Secondary | ICD-10-CM

## 2015-11-24 DIAGNOSIS — C7951 Secondary malignant neoplasm of bone: Secondary | ICD-10-CM | POA: Insufficient documentation

## 2015-11-24 DIAGNOSIS — Z7952 Long term (current) use of systemic steroids: Secondary | ICD-10-CM | POA: Insufficient documentation

## 2015-11-24 DIAGNOSIS — C61 Malignant neoplasm of prostate: Secondary | ICD-10-CM

## 2015-11-24 DIAGNOSIS — Z006 Encounter for examination for normal comparison and control in clinical research program: Secondary | ICD-10-CM | POA: Insufficient documentation

## 2015-11-24 DIAGNOSIS — N200 Calculus of kidney: Secondary | ICD-10-CM

## 2015-11-24 LAB — CBC WITH DIFFERENTIAL/PLATELET
BASOS ABS: 0 10*3/uL (ref 0–0.1)
BASOS PCT: 1 %
EOS ABS: 0.1 10*3/uL (ref 0–0.7)
EOS PCT: 2 %
HCT: 40.9 % (ref 40.0–52.0)
Hemoglobin: 14.2 g/dL (ref 13.0–18.0)
LYMPHS PCT: 35 %
Lymphs Abs: 1.5 10*3/uL (ref 1.0–3.6)
MCH: 33.5 pg (ref 26.0–34.0)
MCHC: 34.7 g/dL (ref 32.0–36.0)
MCV: 96.5 fL (ref 80.0–100.0)
Monocytes Absolute: 0.6 10*3/uL (ref 0.2–1.0)
Monocytes Relative: 14 %
Neutro Abs: 2.1 10*3/uL (ref 1.4–6.5)
Neutrophils Relative %: 48 %
PLATELETS: 210 10*3/uL (ref 150–440)
RBC: 4.23 MIL/uL — AB (ref 4.40–5.90)
RDW: 13.6 % (ref 11.5–14.5)
WBC: 4.3 10*3/uL (ref 3.8–10.6)

## 2015-11-24 LAB — COMPREHENSIVE METABOLIC PANEL
ALBUMIN: 4.6 g/dL (ref 3.5–5.0)
ALT: 11 U/L — AB (ref 17–63)
AST: 16 U/L (ref 15–41)
Alkaline Phosphatase: 61 U/L (ref 38–126)
Anion gap: 4 — ABNORMAL LOW (ref 5–15)
BUN: 19 mg/dL (ref 6–20)
CHLORIDE: 106 mmol/L (ref 101–111)
CO2: 29 mmol/L (ref 22–32)
CREATININE: 1 mg/dL (ref 0.61–1.24)
Calcium: 9.5 mg/dL (ref 8.9–10.3)
GFR calc Af Amer: >60 mL/min (ref >60)
GFR calc non Af Amer: >60 mL/min (ref >60)
Glucose, Bld: 94 mg/dL (ref 65–99)
POTASSIUM: 4.1 mmol/L (ref 3.5–5.1)
SODIUM: 139 mmol/L (ref 135–145)
Total Bilirubin: 1 mg/dL (ref 0.3–1.2)
Total Protein: 7.2 g/dL (ref 6.5–8.1)

## 2015-11-24 LAB — PSA: PSA: 15.69 ng/mL — ABNORMAL HIGH (ref 0.00–4.00)

## 2015-11-24 NOTE — Progress Notes (Signed)
William Wright returns to clinic this morning for consideration of Cycle 14 treatment with Enzalutamide, Abiraterone and Prednisone. Patient returned his completed medication calendars along with his bottle of Enzalutamide, but he did not bring in his Abiraterone or Prednisone this visit. Enzalutamide bottle has 8 capsules left. Mr. Erker states he went to the beach about 10 days ago, then broke out in a rash after he returned home. Dr. Oliva Bustard has examined the rash on his chest and states this is not shingles - as suspected by the patient, but may have been caused by exposure to the sun. Patient is taking Valtrex for the suspected shingles and Dr. 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 He initially denies any other new side effects, but continues to report fatigue, bone pain in his right upper arm, and hot flashes that are essentially unchanged. He also added that he has been experiencing some indigestion(dyspepsia) occasionally for quite a while now - usually in the morning after he takes his medication. He does not remember exactly when this started. Old bottle of Enzalutamide returned to the pharmacy and pharmacist dispensed a new bottle of 120 capsules of study drug Enzalutamide for patient. Mr. Ortel has been scheduled to return to clinic next week for his Xgeva injection and will schedule him to return in 4 weeks to see Dr. Oliva Bustard. Adverse events were assessed as follows:    DIYOR MCLEISH [id number]  11/24/2015  Adverse Event Log  Study/Protocol: Alliance [id number] Cycle: 13 adverse events  Event Grade Onset Date Resolved Date Drug Name Attribution Treatment Comments  Fatigue Grade 1 11/25/14  Enzalutamide Probably    Hot Flashes Grade 1 11/25/14   Unrelated  r/t HST  Weight loss Grade 1 06/09/15  Enzalutamide + Abiraterone Possible    Papular rash on chest Grade 1 11/16/15   Unrelated  Sun  Herples  simplex Grade 1 11/22/15   Unrelated    Dyspepsia Grade 1  unknown  Enzalutamide Possibly    Arm pain (bone) Grade 1   Enzalutamide Possible  May be r/t bone mets   [name], BSN, MHA, OCN 11/24/2015 1:11 PM

## 2015-11-28 ENCOUNTER — Encounter: Payer: Self-pay | Admitting: Oncology

## 2015-11-28 NOTE — Progress Notes (Signed)
[place] @ [facility] [phone]  Fax:([phone]) [place]: 11-11-1950  MR#: [id number]  LPF#:[id number]  Patient Care Team: Provider Not In System as PCP - General Royston Cowper, MD (Urology)  CHIEF COMPLAINT:  Chief Complaint  Patient presents with  . 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 Patient was on androgen deprivation therapy as well as Casodex.  Rising tumor marker.  Bone scan as well as CT scan was positive for extensive metastases Patient is off Casodex approximately 4 weeks ago Patient is on Alliance protocolJune of 2016). The patient is on XTANDI as well as ZYTIGA as per Alliance protocol   65 year old gentleman with stage IV castration resistant prostate cancer  Oncology History   carcinoma  of prostatestage IIc, and  T2 N0 M0 diagnosis in February of 2014 patient had PSA of 18 patient antiandrogentherapyand therapy.  According to him in May of 2015 patient had PSA of 90 was started on androgen deprivation therapy any dropped to 20.  And then 18 later on.  Now PSA is rising againto 26 documented on October 03 2014.  Patient's bone scan and CT revealed extensive metastasis Patient had received Casodex and Trelstar.  2.  Castration resistant prostate cancer.  Patient was started on now ZYTIGA plus minus XTANDI in June of 2016       Cancer of prostate [facility])   08/17/2012 Initial Diagnosis Cancer of prostate, stage II   12/03/2013 Progression    10/13/2014 Progression     Oncology Flowsheet 05/20/2015 06/17/2015 07/15/2015 08/11/2015 09/08/2015 10/06/2015 11/03/2015  denosumab (XGEVA) [place] - 120 mg 120 mg 120 mg 120 mg 120 mg 120 mg  diphenhydrAMINE (BENADRYL) PO 25 mg - - - - - -  promethazine (PHENERGAN) IM 25 mg - - - - - -  Triptorelin Pamoate (TRELSTAR) IM - - - 22.5 mg - - -  Zoledronic Acid (ZOMETA) IV - - - - - - -    INTERVAL HISTORY:  Patient is on Alliance protocol receiving ZYTIGA  plus minus XTANDI.  Abdominal distention and abdominal pain is improved.  No nausea no vomiting.  Appetite is improving.  No bony pains Patient is here for ongoing evaluation.  As occasional hematuria patient has a previous history of multiple kidney stones.  Recently passing stone.  Back pain is improved. Patient is on protocol also receiving   XGEVA Patient is here for ongoing evaluation and treatment consideration. He has undergone treatment for nephrolithiasis.  No bony pain appetite has been fairly good.  No chills.  No fever.  PSA remained stable. Patient was asked to continue Trelstar at [place] by urologist office.   Patient is here for further follow-up and continuation of protocol therapy. NO  Bony pains appetite has been fairly good and her nausea no vomiting no diarrhea. Patient is here for further follow-up.  Patient is getting XGEVA   As well as trelstar  On alliance protocol  No significant change since last evaluation  No bony pains.  Appetite has been stable.    REVIEW OF SYSTEMS:   GENERAL:  Feels good.  Active.  No fevers, sweats or weight loss. PERFORMANCE STATUS (ECOG): 0 HEENT:  No visual changes, runny nose, sore throat, mouth sores or tenderness. Lungs: No shortness of breath or cough.  No hemoptysis. Cardiac:  No chest pain, palpitations, orthopnea, or PND. GI:  No nausea, vomiting, diarrhea, constipation, melena or hematochezia. GU:  No urgency, frequency, dysuria, or hematuria. Musculoskeletal:  No back pain.  No joint pain.  No muscle tenderness. Extremities:  No pain or swelling. Skin:  No rashes or skin changes. Neuro:  No headache, numbness or weakness, balance or coordination issues. Endocrine:  No diabetes, thyroid issues, hot flashes or night sweats. Psych:  No mood changes, depression or anxiety. Pain:  No focal pain. Review of systems:  All other systems reviewed and found to be negative. As per HPI. Otherwise, a complete review of systems is  negatve.  PAST MEDICAL HISTORY: Past Medical History  Diagnosis Date  . Prostate cancer ([place])   . Cancer of prostate ([place]) 11/18/2014    Significant History/PMH:   Prostate Cancer:   Preventive Screening:  Has patient had any of the following test? Colonscopy  Prostate Exam (1)   Last Colonoscopy: October 2013(1)   Last Prostate Exam: 2015(1)   Smoking History: Smoking History Never Smoked.(1)  PFSH: Comments: no significant family history of any malignancy.  Social History: negative alcohol, negative tobacco  Additional Past Medical and Surgical History: no significant history of hypertension or diabetes or coronary artery disease   ADVANCED DIRECTIVES:  Patient does have advance healthcare directive, Patient   does not desire to make any changes HEALTH MAINTENANCE: Social History  Substance Use Topics  . Smoking status: Never Smoker   . Smokeless tobacco: None  . Alcohol Use: No      Allergies  Allergen Reactions  . No Known Allergies     Current Outpatient Prescriptions  Medication Sig Dispense Refill  . abiraterone Acetate (ZYTIGA) 250 MG tablet Take 4 tablets (1,000 mg total) by mouth daily. Take on an empty stomach 1 hour before or 2 hours after a meal 120 tablet 11  . abiraterone Acetate (ZYTIGA) 250 MG tablet Take 4 tablets (1,000 mg total) by mouth daily. Take on an empty stomach 1 hour before or 2 hours after a meal 120 tablet 0  . abiraterone Acetate (ZYTIGA) 250 MG tablet Take 4 tablets (1,000 mg total) by mouth daily. Take on an empty stomach 1 hour before or 2 hours after a meal 120 tablet 0  . fluticasone (FLONASE) 50 MCG/ACT nasal spray Place 1 spray into both nostrils daily. 16 g 2  . hydrocortisone (ANUSOL-HC) 25 MG suppository Place 1 suppository (25 mg total) rectally 2 (two) times daily as needed for hemorrhoids or itching. 12 suppository 0  . Investigational enzalutamide 40 MG capsule ALLIANCE [id number] Take 4 capsules (160 mg total) by mouth  daily. Take with or without food.Swallow whole. Do not crush, or open capsules. 120 capsule 0  . Investigational enzalutamide 40 MG capsule ALLIANCE [id number] Take 4 capsules (160 mg total) by mouth daily. Take with or without food.Swallow whole. Do not crush, or open capsules. 120 capsule 0  . Investigational enzalutamide 40 MG capsule ALLIANCE [id number] Take 4 capsules (160 mg total) by mouth daily. Take with or without food.Swallow whole. Do not crush, or open capsules. 120 capsule 0  . NUCYNTA 50 MG TABS tablet Take 1 tablet (50 mg total) by mouth every 6 (six) hours as needed for moderate pain. 1 TO 2 TABS Q 6 HOURS PRN PAIN 30 tablet 0  . ondansetron (ZOFRAN ODT) 8 MG disintegrating tablet Take 1 tablet (8 mg total) by mouth every 6 (six) hours as needed for nausea or vomiting. 10 tablet 3  . OxyCODONE HCl, Abuse Deter, (OXAYDO) 5 MG TABA Take 1 tablet by mouth every 4 (four)  hours as needed. 30 tablet 0  . predniSONE (DELTASONE) 5 MG tablet Take 1 tablet (5 mg total) by mouth 2 (two) times daily with a meal. 60 tablet 6  . predniSONE (DELTASONE) 5 MG tablet Take 1 tablet (5 mg total) by mouth 2 (two) times daily. 60 tablet 0  . tamsulosin (FLOMAX) 0.4 MG CAPS capsule Take 1 capsule (0.4 mg total) by mouth daily. 30 capsule 11  . Triptorelin Pamoate (TRELSTAR) 22.5 MG injection Inject 22.5 mg into the muscle every 6 (six) months.    . Triptorelin Pamoate (TRELSTAR) 22.5 MG injection Inject 22.5 mg into the muscle every 6 (six) months. 1 each 1  . valACYclovir (VALTREX) 1000 MG tablet Take 1 tablet (1,000 mg total) by mouth 2 (two) times daily. 28 tablet 0   No current facility-administered medications for this visit.    OBJECTIVE:  Filed Vitals:   11/24/15 1102  BP: 130/79  Pulse: 76  Temp: 96.4 F (35.8 C)  Resp: 18     Body mass index is 23.12 kg/(m^2).    ECOG FS:0 - Asymptomatic  PHYSICAL EXAM: GENERAL:  Well developed, well nourished, sitting comfortably in the exam room in no  acute distress. MENTAL STATUS:  Alert and oriented to person, place and time. HEAD:   hair.  Normocephalic, atraumatic, face symmetric, no Cushingoid features. EYES:    Pupils equal round and reactive to light and accomodation.  No conjunctivitis or scleral icterus. ENT:  Oropharynx clear without lesion.  Tongue normal. Mucous membranes moist.  RESPIRATORY:  Clear to auscultation without rales, wheezes or rhonchi. CARDIOVASCULAR:  Regular rate and rhythm without murmur, rub or gallop. . ABDOMEN:  Soft, non-tender, with active bowel sounds, and no hepatosplenomegaly.  No masses. BACK:  No CVA tenderness.  No tenderness on percussion of the back or rib cage. SKIN:  No rashes, ulcers or lesions. EXTREMITIES: No edema, no skin discoloration or tenderness.  No palpable cords. LYMPH NODES: No palpable cervical, supraclavicular, axillary or inguinal adenopathy  NEUROLOGICAL: Unremarkable. PSYCH:  Appropriate.   LAB RESULTS:  Appointment on 11/24/2015  Component Date Value Ref Range Status  . WBC 11/24/2015 4.3  3.8 - 10.6 K/uL Final  . RBC 11/24/2015 4.23* 4.40 - 5.90 MIL/uL Final  . Hemoglobin 11/24/2015 14.2  13.0 - 18.0 g/dL Final  . HCT 51/06/8498 40.9  40.0 - 52.0 % Final  . MCV 11/24/2015 96.5  80.0 - 100.0 fL Final  . MCH 11/24/2015 33.5  26.0 - 34.0 pg Final  . MCHC 11/24/2015 34.7  32.0 - 36.0 g/dL Final  . RDW 93/30/0565 13.6  11.5 - 14.5 % Final  . Platelets 11/24/2015 210  150 - 440 K/uL Final  . Neutrophils Relative % 11/24/2015 48   Final  . Neutro Abs 11/24/2015 2.1  1.4 - 6.5 K/uL Final  . Lymphocytes Relative 11/24/2015 35   Final  . Lymphs Abs 11/24/2015 1.5  1.0 - 3.6 K/uL Final  . Monocytes Relative 11/24/2015 14   Final  . Monocytes Absolute 11/24/2015 0.6  0.2 - 1.0 K/uL Final  . Eosinophils Relative 11/24/2015 2   Final  . Eosinophils Absolute 11/24/2015 0.1  0 - 0.7 K/uL Final  . Basophils Relative 11/24/2015 1   Final  . Basophils Absolute 11/24/2015 0.0  0 -  0.1 K/uL Final  . Sodium 11/24/2015 139  135 - 145 mmol/L Final  . Potassium 11/24/2015 4.1  3.5 - 5.1 mmol/L Final  . Chloride 11/24/2015 106  101 -  111 mmol/L Final  . CO2 11/24/2015 29  22 - 32 mmol/L Final  . Glucose, Bld 11/24/2015 94  65 - 99 mg/dL Final  . BUN 11/24/2015 19  6 - 20 mg/dL Final  . Creatinine, Ser 11/24/2015 1.00  0.61 - 1.24 mg/dL Final  . Calcium 11/24/2015 9.5  8.9 - 10.3 mg/dL Final  . Total Protein 11/24/2015 7.2  6.5 - 8.1 g/dL Final  . Albumin 11/24/2015 4.6  3.5 - 5.0 g/dL Final  . AST 11/24/2015 16  15 - 41 U/L Final  . ALT 11/24/2015 11* 17 - 63 U/L Final  . Alkaline Phosphatase 11/24/2015 61  38 - 126 U/L Final  . Total Bilirubin 11/24/2015 1.0  0.3 - 1.2 mg/dL Final  . GFR calc non Af Amer 11/24/2015 >60  >60 mL/min Final  . GFR calc Af Amer 11/24/2015 >60  >60 mL/min Final   Comment: (NOTE) The eGFR has been calculated using the CKD EPI equation. This calculation has not been validated in all clinical situations. eGFR's persistently <60 mL/min signify possible Chronic Kidney Disease.   . Anion gap 11/24/2015 4* 5 - 15 Final  . PSA 11/24/2015 15.69* 0.00 - 4.00 ng/mL Final   Comment: (NOTE) While PSA levels of <=4.0 ng/ml are reported as reference range, some men with levels below 4.0 ng/ml can have prostate cancer and many men with PSA above 4.0 ng/ml do not have prostate cancer.  Other tests such as free PSA, age specific reference ranges, PSA velocity and PSA doubling time may be helpful especially in men less than 60 years old. Performed at [facility]        {   ASSESSMENT: Castration resistant prostate cancer STAGE iv DISEASE. On Alliance protocol with ascites plus minus XTANDI  Tolerating treatment very well.  As per PSA criteria PSA has dropped again from previous slightly elevated level .Clinically  Bony pains have improved.   4d ago  41moago  256mogo       PSA 0.00 - 4.00 ng/mL 15.69 (H) 17.00 (H)CM 17.28 (H)CM          MEDICAL DECISION MAKING:  All lab data has been reviewed and reviewed Discussed situation with protocol nurses PSA  Shows declining trend.  Continue XGEVA and  TRELSTAR   All medications have been reviewed XGEVA has been ordered for next week  Calcium level is within acceptable range patient is taking calcium tablet.  continue present [place]A.  Continue our  Protocol PSA remains stable Previous bone scan also has been reviewed and shows stable disease Patient has been referred to urologist Patient will continue androgen deprivation therapy with urologist Continue XTGillermina Phynd ZYTIGA as per protocol Discussed  situation with protocol.  All of that appointment has been planned.  Overall patient has stable disease.    other appointments have been made as per protocol requirement.  In my absence patient will be seen by my associate  JCC 7th Edition     Clinical: Stage IV (T2, M1) - Signed by LeEvlyn KannerNP on 01/07/2015   JaForest GleasonMD   11/28/2015 12:00 PM

## 2015-11-29 ENCOUNTER — Telehealth: Payer: Self-pay | Admitting: *Deleted

## 2015-11-29 NOTE — Telephone Encounter (Signed)
Received t/c from William Wright requesting the results of his PSA. PSA results provided and are 15.69 ng/mL - which has improved slightly since last month; which was 17.0. States he is almost finished taking the Valtrex and that his "fever blister" is much better. He also states the rash on his chest is almost completely gone. Voices concern about a bill from [place] that he reports is "going to collections" and was advised to call and check on that since he feels he has not seen a [facility] provider in the past. [name], BSN, MHA, OCN 11/29/2015 9:51 AM

## 2015-12-01 ENCOUNTER — Inpatient Hospital Stay: Payer: Medicare Other

## 2015-12-01 DIAGNOSIS — Z79818 Long term (current) use of other agents affecting estrogen receptors and estrogen levels: Secondary | ICD-10-CM | POA: Diagnosis not present

## 2015-12-01 DIAGNOSIS — Z006 Encounter for examination for normal comparison and control in clinical research program: Secondary | ICD-10-CM | POA: Diagnosis not present

## 2015-12-01 DIAGNOSIS — C61 Malignant neoplasm of prostate: Secondary | ICD-10-CM | POA: Diagnosis not present

## 2015-12-01 DIAGNOSIS — Z79891 Long term (current) use of opiate analgesic: Secondary | ICD-10-CM | POA: Diagnosis not present

## 2015-12-01 DIAGNOSIS — Z79899 Other long term (current) drug therapy: Secondary | ICD-10-CM | POA: Diagnosis not present

## 2015-12-01 DIAGNOSIS — C7951 Secondary malignant neoplasm of bone: Secondary | ICD-10-CM | POA: Diagnosis not present

## 2015-12-01 MED ORDER — DENOSUMAB 120 MG/1.7ML ~~LOC~~ SOLN
120.0000 mg | Freq: Once | SUBCUTANEOUS | Status: AC
Start: 2015-12-01 — End: 2015-12-01
  Administered 2015-12-01: 120 mg via SUBCUTANEOUS
  Filled 2015-12-01: qty 1.7

## 2015-12-22 ENCOUNTER — Encounter: Payer: Self-pay | Admitting: Oncology

## 2015-12-22 ENCOUNTER — Inpatient Hospital Stay (HOSPITAL_BASED_OUTPATIENT_CLINIC_OR_DEPARTMENT_OTHER): Payer: Medicare Other | Admitting: Oncology

## 2015-12-22 ENCOUNTER — Inpatient Hospital Stay: Payer: Medicare Other | Attending: Oncology

## 2015-12-22 ENCOUNTER — Encounter: Payer: Self-pay | Admitting: *Deleted

## 2015-12-22 VITALS — BP 117/76 | HR 66 | Temp 95.6°F | Resp 18 | Wt 162.0 lb

## 2015-12-22 DIAGNOSIS — Z7952 Long term (current) use of systemic steroids: Secondary | ICD-10-CM | POA: Insufficient documentation

## 2015-12-22 DIAGNOSIS — Z79818 Long term (current) use of other agents affecting estrogen receptors and estrogen levels: Secondary | ICD-10-CM

## 2015-12-22 DIAGNOSIS — C61 Malignant neoplasm of prostate: Secondary | ICD-10-CM

## 2015-12-22 DIAGNOSIS — C7951 Secondary malignant neoplasm of bone: Secondary | ICD-10-CM | POA: Diagnosis not present

## 2015-12-22 DIAGNOSIS — Z006 Encounter for examination for normal comparison and control in clinical research program: Secondary | ICD-10-CM | POA: Insufficient documentation

## 2015-12-22 DIAGNOSIS — Z79899 Other long term (current) drug therapy: Secondary | ICD-10-CM | POA: Insufficient documentation

## 2015-12-22 DIAGNOSIS — Z87442 Personal history of urinary calculi: Secondary | ICD-10-CM

## 2015-12-22 LAB — CBC WITH DIFFERENTIAL/PLATELET
BASOS PCT: 1 %
Basophils Absolute: 0 10*3/uL (ref 0–0.1)
EOS ABS: 0.1 10*3/uL (ref 0–0.7)
Eosinophils Relative: 1 %
HCT: 40.5 % (ref 40.0–52.0)
Hemoglobin: 14.2 g/dL (ref 13.0–18.0)
Lymphocytes Relative: 29 %
Lymphs Abs: 1.6 10*3/uL (ref 1.0–3.6)
MCH: 34 pg (ref 26.0–34.0)
MCHC: 35 g/dL (ref 32.0–36.0)
MCV: 97.3 fL (ref 80.0–100.0)
MONO ABS: 0.5 10*3/uL (ref 0.2–1.0)
MONOS PCT: 10 %
NEUTROS PCT: 59 %
Neutro Abs: 3.3 10*3/uL (ref 1.4–6.5)
PLATELETS: 201 10*3/uL (ref 150–440)
RBC: 4.16 MIL/uL — ABNORMAL LOW (ref 4.40–5.90)
RDW: 14.1 % (ref 11.5–14.5)
WBC: 5.6 10*3/uL (ref 3.8–10.6)

## 2015-12-22 LAB — COMPREHENSIVE METABOLIC PANEL
ALBUMIN: 4.6 g/dL (ref 3.5–5.0)
ALT: 12 U/L — ABNORMAL LOW (ref 17–63)
ANION GAP: 7 (ref 5–15)
AST: 17 U/L (ref 15–41)
Alkaline Phosphatase: 52 U/L (ref 38–126)
BUN: 21 mg/dL — ABNORMAL HIGH (ref 6–20)
CALCIUM: 9.2 mg/dL (ref 8.9–10.3)
CO2: 28 mmol/L (ref 22–32)
CREATININE: 1.01 mg/dL (ref 0.61–1.24)
Chloride: 105 mmol/L (ref 101–111)
GFR calc Af Amer: >60 mL/min (ref >60)
GFR calc non Af Amer: >60 mL/min (ref >60)
GLUCOSE: 88 mg/dL (ref 65–99)
POTASSIUM: 4.2 mmol/L (ref 3.5–5.1)
SODIUM: 140 mmol/L (ref 135–145)
TOTAL PROTEIN: 7 g/dL (ref 6.5–8.1)
Total Bilirubin: 1.1 mg/dL (ref 0.3–1.2)

## 2015-12-22 LAB — PSA: PSA: 15.06 ng/mL — AB (ref 0.00–4.00)

## 2015-12-22 MED ORDER — INV-ENZALUTAMIDE 40 MG CAPS #120 ALLIANCE A031201: 160.0000 mg | ORAL_CAPSULE | Freq: Every day | ORAL | Status: DC

## 2015-12-22 NOTE — Progress Notes (Signed)
William Wright returns to clinic this morning for consideration of Cycle 15 treatment on the Alliance [id number] research study with Enzalutamide, Abiraterone and Prednisone. Reports he is doing well and denies any new problems. His hematology and chemistry results are within acceptable parameters. He reports the rash on his chest has resolved as well as the Herpes simplex or "fever blister" on his upper lip. He denies any problems tolerating his medication and has returned his completed medication diaries along with his PK worksheet. He also returned his Enzalutamide bottle with 8 capsules remaining, indicating that he has taken all of the study drug as prescribed. Dr. Oliva Bustard in to examine patient and discusses with him that his next office visit will be scheduled with Dr. [name]. He also questioned the patient regarding his dental exams related to the fact that he is receiving Delton See, and requested that he make sure he is seeing his dentist every 6 months and that he is aware he is on this medication. [name] was also informed that he will receive his CT scan and bone scan the week prior to his next office visit, and he requests that both scans be scheduled at [facility] road and on the same day; a Wednesday or Thursday if possible. Old bottle of Enzalutamide returned to the pharmacy and a new bottle of 120 capsules of study drug Enzalutamide was dispensed by the pharmacist and given to the patient along with new medication diaries. [name] has also been scheduled to return to clinic next week for his Xgeva injection and will schedule him to return in 4 weeks to see Dr. Oliva Bustard. Adverse events were assessed as follows:    [name] [id number]  12/22/2015  Adverse Event Log  Study/Protocol: Alliance [id number] Cycle: 14 adverse events  Event Grade Onset Date Resolved Date Drug Name Attribution Treatment Comments  Fatigue Grade 1 11/25/14  Enzalutamide Probably    Hot Flashes Grade 1  11/25/14   Unrelated  r/t HST  Weight loss Grade 1 06/09/15  Enzalutamide + Abiraterone Possible    Papular rash on chest Grade 1 11/16/15 12/01/15  Unrelated  Sun  Herples simplex Grade 1 11/22/15 12/01/15  Unrelated    Dyspepsia Grade 1  unknown 11/2015 Enzalutamide Possibly  Denies now  Arm pain (bone) Grade 1   Enzalutamide Possible  May be r/t bone mets   [name], BSN, MHA, OCN 12/22/2015 10:07 AM

## 2015-12-22 NOTE — Progress Notes (Signed)
[facility]cer [facility] @ [facility] [phone]  [phone]     William Wright OB: 09/30/1950  MR#: [id number]  CSN#:[id number]  Patient Care Team: Provider Not In System as PCP - General Michael R Wolff, MD (Urology)  CHIEF COMPLAINT:  Chief Complaint  Patient presents with  . 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 Patient was on androgen deprivation therapy as well as Casodex.  Rising tumor marker.  Bone scan as well as CT scan was positive for extensive metastases Patient is off Casodex approximately 4 weeks ago Patient is on Alliance protocolJune of 2016). The patient is on XTANDI as well as ZYTIGA as per Alliance protocol   64-year-old gentleman with stage IV castration resistant prostate cancer  Oncology History   carcinoma  of prostatestage IIc, and  T2 N0 M0 diagnosis in February of 2014 patient had PSA of 18 patient antiandrogentherapyand therapy.  According to him in May of 2015 patient had PSA of 90 was started on androgen deprivation therapy any dropped to 20.  And then 18 later on.  Now PSA is rising againto 26 documented on October 03 2014.  Patient's bone scan and CT revealed extensive metastasis Patient had received Casodex and Trelstar.  2.  Castration resistant prostate cancer.  Patient was started on now ZYTIGA plus minus XTANDI in June of 2016       Cancer of prostate (HCC)   08/17/2012 Initial Diagnosis Cancer of prostate, stage II   12/03/2013 Progression    10/13/2014 Progression       INTERVAL HISTORY:  Patient is on Alliance protocol receiving ZYTIGA plus minus XTANDI.  Abdominal distention and abdominal pain is improved.  No nausea no vomiting.  Appetite is improving.  No bony pains Patient is here for ongoing evaluation.  As occasional hematuria patient has a previous history of multiple kidney stones.  Recently passing stone.  Back pain is improved. Patient is on protocol also receiving    XGEVA Patient is here for ongoing evaluation and treatment consideration. He has undergone treatment for nephrolithiasis.  No bony pain appetite has been fairly good.  No chills.  No fever.  PSA remained stable. Patient was asked to continue Trelstar at cancer Center by urologist office.   Patient is here for further follow-up and continuation of protocol therapy. NO  Bony pains appetite has been fairly good and her nausea no vomiting no diarrhea. Patient is here for further follow-up.  Patient is getting XGEVA   As well as trelstar  On alliance protocol  No significant change since last evaluation  No bony pains.  Appetite has been stable.    Patient does not have any significant symptoms except for occasional tingling numbness.  Does not interfere with the work. Tolerating treatment very well.  PSA is slightly high but stable.  Patient is getting regular CT scan and bone scan as per protocol in so far disease has been stable.  REVIEW OF SYSTEMS:   GENERAL:  Feels good.  Active.  No fevers, sweats or weight loss. PERFORMANCE STATUS (ECOG): 0 HEENT:  No visual changes, runny nose, sore throat, mouth sores or tenderness. Lungs: No shortness of breath or cough.  No hemoptysis. Cardiac:  No chest pain, palpitations, orthopnea, or PND. GI:  No nausea, vomiting, diarrhea, constipation, melena or hematochezia. GU:  No urgency, frequency, dysuria, or hematuria. Musculoskeletal:  No back pain.  No joint pain.  No muscle tenderness. Extremities:  No pain or   swelling. Skin:  No rashes or skin changes. Neuro:  No headache, numbness or weakness, balance or coordination issues. Endocrine:  No diabetes, thyroid issues, hot flashes or night sweats. Psych:  No mood changes, depression or anxiety. Pain:  No focal pain. Review of systems:  All other systems reviewed and found to be negative. As per HPI. Otherwise, a complete review of systems is negatve.  PAST MEDICAL HISTORY: Past Medical History   Diagnosis Date  . Prostate cancer (HCC)   . Cancer of prostate (HCC) 11/18/2014    Significant History/PMH:   Prostate Cancer:   Preventive Screening:  Has patient had any of the following test? Colonscopy  Prostate Exam (1)   Last Colonoscopy: October 2013(1)   Last Prostate Exam: 2015(1)   Smoking History: Smoking History Never Smoked.(1)  PFSH: Comments: no significant family history of any malignancy.  Social History: negative alcohol, negative tobacco  Additional Past Medical and Surgical History: no significant history of hypertension or diabetes or coronary artery disease   ADVANCED DIRECTIVES:  Patient does have advance healthcare directive, Patient   does not desire to make any changes HEALTH MAINTENANCE: Social History  Substance Use Topics  . Smoking status: Never Smoker   . Smokeless tobacco: None  . Alcohol Use: No      Allergies  Allergen Reactions  . No Known Allergies     Current Outpatient Prescriptions  Medication Sig Dispense Refill  . abiraterone Acetate (ZYTIGA) 250 MG tablet Take 4 tablets (1,000 mg total) by mouth daily. Take on an empty stomach 1 hour before or 2 hours after a meal 120 tablet 11  . abiraterone Acetate (ZYTIGA) 250 MG tablet Take 4 tablets (1,000 mg total) by mouth daily. Take on an empty stomach 1 hour before or 2 hours after a meal 120 tablet 0  . abiraterone Acetate (ZYTIGA) 250 MG tablet Take 4 tablets (1,000 mg total) by mouth daily. Take on an empty stomach 1 hour before or 2 hours after a meal 120 tablet 0  . fluticasone (FLONASE) 50 MCG/ACT nasal spray Place 1 spray into both nostrils daily. 16 g 2  . hydrocortisone (ANUSOL-HC) 25 MG suppository Place 1 suppository (25 mg total) rectally 2 (two) times daily as needed for hemorrhoids or itching. 12 suppository 0  . Investigational enzalutamide 40 MG capsule ALLIANCE [id number] Take 4 capsules (160 mg total) by mouth daily. Take with or without food.Swallow whole. Do not  crush, or open capsules. 120 capsule 0  . Investigational enzalutamide 40 MG capsule ALLIANCE [id number] Take 4 capsules (160 mg total) by mouth daily. Take with or without food.Swallow whole. Do not crush, or open capsules. 120 capsule 0  . Investigational enzalutamide 40 MG capsule ALLIANCE [id number] Take 4 capsules (160 mg total) by mouth daily. Take with or without food.Swallow whole. Do not crush, or open capsules. 120 capsule 0  . NUCYNTA 50 MG TABS tablet Take 1 tablet (50 mg total) by mouth every 6 (six) hours as needed for moderate pain. 1 TO 2 TABS Q 6 HOURS PRN PAIN 30 tablet 0  . ondansetron (ZOFRAN ODT) 8 MG disintegrating tablet Take 1 tablet (8 mg total) by mouth every 6 (six) hours as needed for nausea or vomiting. 10 tablet 3  . OxyCODONE HCl, Abuse Deter, (OXAYDO) 5 MG TABA Take 1 tablet by mouth every 4 (four) hours as needed. 30 tablet 0  . predniSONE (DELTASONE) 5 MG tablet Take 1 tablet (5 mg total) by mouth 2 (two) times   daily with a meal. 60 tablet 6  . predniSONE (DELTASONE) 5 MG tablet Take 1 tablet (5 mg total) by mouth 2 (two) times daily. 60 tablet 0  . tamsulosin (FLOMAX) 0.4 MG CAPS capsule Take 1 capsule (0.4 mg total) by mouth daily. 30 capsule 11  . Triptorelin Pamoate (TRELSTAR) 22.5 MG injection Inject 22.5 mg into the muscle every 6 (six) months.    . Triptorelin Pamoate (TRELSTAR) 22.5 MG injection Inject 22.5 mg into the muscle every 6 (six) months. 1 each 1  . valACYclovir (VALTREX) 1000 MG tablet Take 1 tablet (1,000 mg total) by mouth 2 (two) times daily. 28 tablet 0  . Investigational enzalutamide 40 MG capsule ALLIANCE [id number] Take 4 capsules (160 mg total) by mouth daily. Take with or without food.Swallow whole. Do not crush, or open capsules. 120 capsule 0   No current facility-administered medications for this visit.    OBJECTIVE:  Filed Vitals:   12/22/15 0941  BP: 117/76  Pulse: 66  Temp: 95.6 F (35.3 C)  Resp: 18     Body mass index is 23.24  kg/(m^2).    ECOG FS:0 - Asymptomatic  PHYSICAL EXAM: GENERAL:  Well developed, well nourished, sitting comfortably in the exam room in no acute distress. MENTAL STATUS:  Alert and oriented to person, place and time. HEAD:   hair.  Normocephalic, atraumatic, face symmetric, no Cushingoid features. EYES:    Pupils equal round and reactive to light and accomodation.  No conjunctivitis or scleral icterus. ENT:  Oropharynx clear without lesion.  Tongue normal. Mucous membranes moist.  RESPIRATORY:  Clear to auscultation without rales, wheezes or rhonchi. CARDIOVASCULAR:  Regular rate and rhythm without murmur, rub or gallop. . ABDOMEN:  Soft, non-tender, with active bowel sounds, and no hepatosplenomegaly.  No masses. BACK:  No CVA tenderness.  No tenderness on percussion of the back or rib cage. SKIN:  No rashes, ulcers or lesions. EXTREMITIES: No edema, no skin discoloration or tenderness.  No palpable cords. LYMPH NODES: No palpable cervical, supraclavicular, axillary or inguinal adenopathy  NEUROLOGICAL: Unremarkable. PSYCH:  Appropriate.   LAB RESULTS:  Appointment on 12/22/2015  Component Date Value Ref Range Status  . WBC 12/22/2015 5.6  3.8 - 10.6 K/uL Final  . RBC 12/22/2015 4.16* 4.40 - 5.90 MIL/uL Final  . Hemoglobin 12/22/2015 14.2  13.0 - 18.0 g/dL Final  . HCT 12/22/2015 40.5  40.0 - 52.0 % Final  . MCV 12/22/2015 97.3  80.0 - 100.0 fL Final  . MCH 12/22/2015 34.0  26.0 - 34.0 pg Final  . MCHC 12/22/2015 35.0  32.0 - 36.0 g/dL Final  . RDW 12/22/2015 14.1  11.5 - 14.5 % Final  . Platelets 12/22/2015 201  150 - 440 K/uL Final  . Neutrophils Relative % 12/22/2015 59   Final  . Neutro Abs 12/22/2015 3.3  1.4 - 6.5 K/uL Final  . Lymphocytes Relative 12/22/2015 29   Final  . Lymphs Abs 12/22/2015 1.6  1.0 - 3.6 K/uL Final  . Monocytes Relative 12/22/2015 10   Final  . Monocytes Absolute 12/22/2015 0.5  0.2 - 1.0 K/uL Final  . Eosinophils Relative 12/22/2015 1   Final  .  Eosinophils Absolute 12/22/2015 0.1  0 - 0.7 K/uL Final  . Basophils Relative 12/22/2015 1   Final  . Basophils Absolute 12/22/2015 0.0  0 - 0.1 K/uL Final  . Sodium 12/22/2015 140  135 - 145 mmol/L Final  . Potassium 12/22/2015 4.2  3.5 - 5.1 mmol/L   Final  . Chloride 12/22/2015 105  101 - 111 mmol/L Final  . CO2 12/22/2015 28  22 - 32 mmol/L Final  . Glucose, Bld 12/22/2015 88  65 - 99 mg/dL Final  . BUN 12/22/2015 21* 6 - 20 mg/dL Final  . Creatinine, Ser 12/22/2015 1.01  0.61 - 1.24 mg/dL Final  . Calcium 12/22/2015 9.2  8.9 - 10.3 mg/dL Final  . Total Protein 12/22/2015 7.0  6.5 - 8.1 g/dL Final  . Albumin 12/22/2015 4.6  3.5 - 5.0 g/dL Final  . AST 12/22/2015 17  15 - 41 U/L Final  . ALT 12/22/2015 12* 17 - 63 U/L Final  . Alkaline Phosphatase 12/22/2015 52  38 - 126 U/L Final  . Total Bilirubin 12/22/2015 1.1  0.3 - 1.2 mg/dL Final  . GFR calc non Af Amer 12/22/2015 >60  >60 mL/min Final  . GFR calc Af Amer 12/22/2015 >60  >60 mL/min Final   Comment: (NOTE) The eGFR has been calculated using the CKD EPI equation. This calculation has not been validated in all clinical situations. eGFR's persistently <60 mL/min signify possible Chronic Kidney Disease.   . Anion gap 12/22/2015 7  5 - 15 Final  . PSA 12/22/2015 15.06* 0.00 - 4.00 ng/mL Final   Comment: (NOTE) While PSA levels of <=4.0 ng/ml are reported as reference range, some men with levels below 4.0 ng/ml can have prostate cancer and many men with PSA above 4.0 ng/ml do not have prostate cancer.  Other tests such as free PSA, age specific reference ranges, PSA velocity and PSA doubling time may be helpful especially in men less than 60 years old. Performed at [facility]        {   ASSESSMENT: Castration resistant prostate cancer STAGE iv DISEASE. On Alliance protocol with ascites plus minus XTANDI  Tolerating treatment very well.  As per PSA criteria PSA has dropped again from previous slightly elevated  level .Clinically  Bony pains have improved. All lab data has been reviewed and the PSA remains stable. Patient is due for next androgen deprivation therapy with  TRELSTAR  (22.5 mg) in July.  Patient is getting regular XGEVA.     MEDICAL DECISION MAKING:  All lab data has been reviewed and reviewed Discussed situation with protocol nurses PSA  Shows declining trend.  Continue XGEVA and  TRELSTAR   discussed situation with research nurses  All medications have been reviewed XGEVA has been ordered for next week  Calcium level is within acceptable range patient is taking calcium tablet.  continue present Xgeva and XTANDI and ZYTIGA.  Continue our  Protocol PSA remains stable Previous bone scan also has been reviewed and shows stable disease Patient has been referred to urologist Patient will continue androgen deprivation therapy with urologist Continue XTANDI and ZYTIGA as per protocol Discussed  situation with protocol.  All of that appointment has been planned.  Overall patient has stable disease.    other appointments have been made as per protocol requirement.  In my absence patient will be seen by my associate  JCC 7th Edition     Clinical: Stage IV (T2, M1) - Signed by Leslie F Herring, NP on 01/07/2015   Janak Choksi, MD   12/22/2015 5:06 PM 

## 2015-12-23 ENCOUNTER — Other Ambulatory Visit: Payer: Self-pay | Admitting: *Deleted

## 2015-12-23 DIAGNOSIS — C61 Malignant neoplasm of prostate: Secondary | ICD-10-CM

## 2015-12-29 ENCOUNTER — Other Ambulatory Visit: Payer: Self-pay | Admitting: *Deleted

## 2015-12-29 ENCOUNTER — Inpatient Hospital Stay: Payer: Medicare Other

## 2015-12-29 DIAGNOSIS — Z7952 Long term (current) use of systemic steroids: Secondary | ICD-10-CM | POA: Diagnosis not present

## 2015-12-29 DIAGNOSIS — Z79818 Long term (current) use of other agents affecting estrogen receptors and estrogen levels: Secondary | ICD-10-CM | POA: Diagnosis not present

## 2015-12-29 DIAGNOSIS — Z79899 Other long term (current) drug therapy: Secondary | ICD-10-CM | POA: Diagnosis not present

## 2015-12-29 DIAGNOSIS — Z006 Encounter for examination for normal comparison and control in clinical research program: Secondary | ICD-10-CM | POA: Diagnosis not present

## 2015-12-29 DIAGNOSIS — C7951 Secondary malignant neoplasm of bone: Secondary | ICD-10-CM | POA: Diagnosis not present

## 2015-12-29 DIAGNOSIS — C61 Malignant neoplasm of prostate: Secondary | ICD-10-CM | POA: Diagnosis not present

## 2015-12-29 MED ORDER — DENOSUMAB 120 MG/1.7ML ~~LOC~~ SOLN
120.0000 mg | Freq: Once | SUBCUTANEOUS | Status: AC
  Administered 2015-12-29: 120 mg via SUBCUTANEOUS
  Filled 2015-12-29: qty 1.7

## 2015-12-29 MED ORDER — PREDNISONE 5 MG PO TABS
5.0000 mg | ORAL_TABLET | Freq: Two times a day (BID) | ORAL | Status: DC
Start: 2015-12-29 — End: 2016-01-03

## 2015-12-29 NOTE — Telephone Encounter (Signed)
Already filled

## 2016-01-03 ENCOUNTER — Other Ambulatory Visit: Payer: Self-pay | Admitting: *Deleted

## 2016-01-03 DIAGNOSIS — C61 Malignant neoplasm of prostate: Secondary | ICD-10-CM

## 2016-01-03 MED ORDER — PREDNISONE 5 MG PO TABS: 5.0000 mg | ORAL_TABLET | Freq: Two times a day (BID) | ORAL | Status: DC

## 2016-01-03 NOTE — Addendum Note (Signed)
Addended by: Betti Cruz on: 01/03/2016 04:37 PM   Modules accepted: Orders

## 2016-01-12 ENCOUNTER — Encounter
Admission: RE | Admit: 2016-01-12 | Discharge: 2016-01-12 | Disposition: A | Discharge status: Home / Self Care | Payer: Medicare Other | Source: Ambulatory Visit | Attending: Oncology | Admitting: Oncology

## 2016-01-12 ENCOUNTER — Ambulatory Visit
Admission: RE | Admit: 2016-01-12 | Discharge: 2016-01-12 | Disposition: A | Discharge status: Home / Self Care | Payer: Medicare Other | Source: Ambulatory Visit | Attending: Oncology | Admitting: Oncology

## 2016-01-12 DIAGNOSIS — I2510 Atherosclerotic heart disease of native coronary artery without angina pectoris: Secondary | ICD-10-CM | POA: Diagnosis not present

## 2016-01-12 DIAGNOSIS — C61 Malignant neoplasm of prostate: Secondary | ICD-10-CM

## 2016-01-12 DIAGNOSIS — C7951 Secondary malignant neoplasm of bone: Secondary | ICD-10-CM | POA: Insufficient documentation

## 2016-01-12 DIAGNOSIS — R590 Localized enlarged lymph nodes: Secondary | ICD-10-CM | POA: Diagnosis not present

## 2016-01-12 MED ORDER — IOPAMIDOL (ISOVUE-300) INJECTION 61%
100.0000 mL | Freq: Once | INTRAVENOUS | Status: AC | PRN
  Administered 2016-01-12: 100 mL via INTRAVENOUS

## 2016-01-12 MED ORDER — TECHNETIUM TC 99M MEDRONATE IV KIT
25.0000 | PACK | Freq: Once | INTRAVENOUS | Status: AC | PRN
  Administered 2016-01-12: 22.34 via INTRAVENOUS

## 2016-01-19 ENCOUNTER — Encounter: Payer: Self-pay | Admitting: *Deleted

## 2016-01-19 ENCOUNTER — Inpatient Hospital Stay (HOSPITAL_BASED_OUTPATIENT_CLINIC_OR_DEPARTMENT_OTHER): Payer: Medicare Other | Admitting: Internal Medicine

## 2016-01-19 ENCOUNTER — Encounter: Payer: Self-pay | Admitting: Internal Medicine

## 2016-01-19 ENCOUNTER — Inpatient Hospital Stay: Payer: Medicare Other | Attending: Internal Medicine

## 2016-01-19 ENCOUNTER — Other Ambulatory Visit: Payer: Self-pay | Admitting: *Deleted

## 2016-01-19 VITALS — BP 129/76 | HR 76 | Temp 96.9°F | Resp 18 | Wt 159.8 lb

## 2016-01-19 DIAGNOSIS — C61 Malignant neoplasm of prostate: Secondary | ICD-10-CM | POA: Diagnosis not present

## 2016-01-19 DIAGNOSIS — C7951 Secondary malignant neoplasm of bone: Secondary | ICD-10-CM | POA: Insufficient documentation

## 2016-01-19 DIAGNOSIS — R591 Generalized enlarged lymph nodes: Secondary | ICD-10-CM | POA: Diagnosis not present

## 2016-01-19 DIAGNOSIS — Z79818 Long term (current) use of other agents affecting estrogen receptors and estrogen levels: Secondary | ICD-10-CM

## 2016-01-19 DIAGNOSIS — Z7952 Long term (current) use of systemic steroids: Secondary | ICD-10-CM | POA: Insufficient documentation

## 2016-01-19 DIAGNOSIS — Z87442 Personal history of urinary calculi: Secondary | ICD-10-CM | POA: Insufficient documentation

## 2016-01-19 DIAGNOSIS — Z006 Encounter for examination for normal comparison and control in clinical research program: Secondary | ICD-10-CM | POA: Diagnosis not present

## 2016-01-19 LAB — CBC WITH DIFFERENTIAL/PLATELET
Basophils Absolute: 0 10*3/uL (ref 0–0.1)
Basophils Relative: 1 %
EOS ABS: 0.1 10*3/uL (ref 0–0.7)
Eosinophils Relative: 1 %
HCT: 41.7 % (ref 40.0–52.0)
HEMOGLOBIN: 14.8 g/dL (ref 13.0–18.0)
LYMPHS PCT: 16 %
Lymphs Abs: 1.1 10*3/uL (ref 1.0–3.6)
MCH: 34.3 pg — AB (ref 26.0–34.0)
MCHC: 35.4 g/dL (ref 32.0–36.0)
MCV: 97 fL (ref 80.0–100.0)
Monocytes Absolute: 0.8 10*3/uL (ref 0.2–1.0)
Monocytes Relative: 11 %
NEUTROS PCT: 71 %
Neutro Abs: 5.1 10*3/uL (ref 1.4–6.5)
Platelets: 220 10*3/uL (ref 150–440)
RBC: 4.3 MIL/uL — AB (ref 4.40–5.90)
RDW: 13.7 % (ref 11.5–14.5)
WBC: 7.1 10*3/uL (ref 3.8–10.6)

## 2016-01-19 LAB — COMPREHENSIVE METABOLIC PANEL
ALT: 10 U/L — AB (ref 17–63)
AST: 15 U/L (ref 15–41)
Albumin: 4.4 g/dL (ref 3.5–5.0)
Alkaline Phosphatase: 60 U/L (ref 38–126)
Anion gap: 6 (ref 5–15)
BUN: 21 mg/dL — ABNORMAL HIGH (ref 6–20)
CHLORIDE: 106 mmol/L (ref 101–111)
CO2: 28 mmol/L (ref 22–32)
CREATININE: 0.9 mg/dL (ref 0.61–1.24)
Calcium: 9.4 mg/dL (ref 8.9–10.3)
Glucose, Bld: 93 mg/dL (ref 65–99)
POTASSIUM: 3.8 mmol/L (ref 3.5–5.1)
SODIUM: 140 mmol/L (ref 135–145)
Total Bilirubin: 0.7 mg/dL (ref 0.3–1.2)
Total Protein: 7.4 g/dL (ref 6.5–8.1)

## 2016-01-19 LAB — PSA: PSA: 17.52 ng/mL — AB (ref 0.00–4.00)

## 2016-01-19 MED ORDER — INV-ENZALUTAMIDE 40 MG CAPS #120 ALLIANCE A031201: 160.0000 mg | ORAL_CAPSULE | Freq: Every day | ORAL | Status: DC

## 2016-01-19 NOTE — Progress Notes (Signed)
[name] returns to clinic this morning for consideration of his cycle 16, Day 1 treatment on the Alliance [phone] Prostate study for which he receives Enzalutamide, Abiraterone and Prednisone.  Reports he is doing well and the only new problems include an episode of right flank pain and a recent headache due to sinus/allergy problems. His hematology and chemistry results are within acceptable parameters. He denies any problems tolerating his medication and has returned his completed medication diaries along with the PK worksheet. He has also returned his Enzalutamide bottle with 8 capsules remaining, indicating that he has taken all of the study drug as prescribed. William Wright states he has not yet made an appointment with his dentist, as requested by Dr. Oliva Wright on the last clinic visit, but plans to do that today. Reports he had a headache yesterday that lasted approx. 10 hours due to sinus pressure and reports he is experiencing allergy related sinus issues. He discussed this with Dr. Rogue Wright, who suggested he take OTC Zyrtec, Allegra or Claritin. He also reports experiencing flank pain after his recent CT scan that only lasted the one day and he has not had any further pain from the known right renal calculi. Reminded that he should go ahead and call Dr. Eliberto Wright and report these symptoms. Dr. Rogue Wright reviewed the results of his CT & Bone scan and PSA levels with William Wright and concurs that we will continue with protocol treatment since there is no evidence of disease progression at this time. Old bottle of Enzalutamide returned to the pharmacy and a new bottle of 120 capsules of study drug Enzalutamide was dispensed by the pharmacist and given to the patient along with new medication diaries. William Wright has also been scheduled to return to clinic in 2 weeks for his Delton See and Trelstar injections and again in 4 weeks to see Dr. Rogue Wright for the study. Adverse events were assessed as  follows:    Adverse Event Log  Study/Protocol: Alliance [id number] Cycle: 15 adverse events  Event Grade Onset Date Resolved Date Drug Name Enzalutamide + Abiraterone Attribution Treatment Comments  Fatigue Grade 1 11/25/14   Probably    Hot Flashes Grade 1 11/25/14   Unrelated  r/t HST  Weight loss Grade 1 06/09/15   Possible    Arm pain (bone) Grade 1    Possible  May be r/t bone mets  Headache Grade 1 01/18/16 01/18/16  Unrelated  States r/t sinus pressure from allergies  Flank Pain r/t Renal calculi Grade 2 01/14/16 01/14/16  Unrelated  Pain r/t known rt. renal calculi            William Wright, BSN, MHA, OCN 01/19/2016 12:23 PM

## 2016-01-19 NOTE — Progress Notes (Signed)
[place] OFFICE PROGRESS NOTE  Patient Care Team: Provider Not In System as PCP - General Royston Cowper, MD (Urology)  Cancer of prostate [facility])   Staging form: Prostate, AJCC 7th Edition     Clinical: Stage IV (T2, M1) - Signed by Evlyn Kanner, NP on 01/07/2015    Oncology History   # FEB 2014- Prostate cancer Stage II [T2N0]; PA-18;   # May 2015- PSA 90 on ADT; CT/Bone scan-extensive mets;  casodex+ Trelstar  # June 2016- Castration resistant prostate cancer/Alliance protocol- ZYTIGA plus minus XTANDI; June 28th-CT-C/A/P- Stable Retrocrural/RP/Pelvic LN;Bone scan- Stable sclerotic lesions   # Xgeva     Cancer of prostate ([place])   08/17/2012 Initial Diagnosis Cancer of prostate, stage II   12/03/2013 Progression    10/13/2014 Progression     Prostate cancer metastatic to multiple sites ([place])   01/19/2016 Initial Diagnosis Prostate cancer metastatic to multiple sites [facility])     INTERVAL HISTORY:  VINEET HOLD 65 y.o.  male pleasant patient above history of metastatic castrate resistant prostate cancer currently on clinical trial with Zytiga plus Xtandi; is here to review the results of his restaging CAT scan and bone scan. Patient is also on Xgeva.  Patient had episode of right flank pain every week single: Resolved. Prior History of kidney stone.   Appetite is good. Denies any pain. No nausea or vomiting. No swelling in the legs. No significant fatigue.   REVIEW OF SYSTEMS:  A complete 10 point review of system is done which is negative except mentioned above/history of present illness.   PAST MEDICAL HISTORY :  Past Medical History  Diagnosis Date  . Prostate cancer ([place])   . Cancer of prostate ([place]) 11/18/2014    PAST SURGICAL HISTORY :   Past Surgical History  Procedure Laterality Date  . Extracorporeal shock wave lithotripsy Left 05/20/2015    Procedure: EXTRACORPOREAL SHOCK WAVE LITHOTRIPSY (ESWL);  Surgeon: Royston Cowper, MD;  Location:  [facility] ORS;  Service: Urology;  Laterality: Left;    FAMILY HISTORY :  No family history on file.  SOCIAL HISTORY:   Social History  Substance Use Topics  . Smoking status: Never Smoker   . Smokeless tobacco: Not on file  . Alcohol Use: No    ALLERGIES:  is allergic to no known allergies.  MEDICATIONS:  Current Outpatient Prescriptions  Medication Sig Dispense Refill  . abiraterone Acetate (ZYTIGA) 250 MG tablet Take 4 tablets (1,000 mg total) by mouth daily. Take on an empty stomach 1 hour before or 2 hours after a meal 120 tablet 0  . fluticasone (FLONASE) 50 MCG/ACT nasal spray Place 1 spray into both nostrils daily. 16 g 2  . hydrocortisone (ANUSOL-HC) 25 MG suppository Place 1 suppository (25 mg total) rectally 2 (two) times daily as needed for hemorrhoids or itching. 12 suppository 0  . Investigational enzalutamide 40 MG capsule ALLIANCE [id number] Take 4 capsules (160 mg total) by mouth daily. Take with or without food.Swallow whole. Do not crush, or open capsules. 120 capsule 0  . NUCYNTA 50 MG TABS tablet Take 1 tablet (50 mg total) by mouth every 6 (six) hours as needed for moderate pain. 1 TO 2 TABS Q 6 HOURS PRN PAIN 30 tablet 0  . ondansetron (ZOFRAN ODT) 8 MG disintegrating tablet Take 1 tablet (8 mg total) by mouth every 6 (six) hours as needed for nausea or vomiting. 10 tablet 3  . OxyCODONE HCl, Abuse Deter, (OXAYDO) 5  MG TABA Take 1 tablet by mouth every 4 (four) hours as needed. 30 tablet 0  . predniSONE (DELTASONE) 5 MG tablet Take 1 tablet (5 mg total) by mouth 2 (two) times daily. 60 tablet 0  . tamsulosin (FLOMAX) 0.4 MG CAPS capsule Take 1 capsule (0.4 mg total) by mouth daily. 30 capsule 11  . Triptorelin Pamoate (TRELSTAR) 22.5 MG injection Inject 22.5 mg into the muscle every 6 (six) months.    . Triptorelin Pamoate (TRELSTAR) 22.5 MG injection Inject 22.5 mg into the muscle every 6 (six) months. 1 each 1  . Investigational enzalutamide 40 MG capsule ALLIANCE  [id number] Take 4 capsules (160 mg total) by mouth daily. Take with or without food.Swallow whole. Do not crush, or open capsules. 120 capsule 0   No current facility-administered medications for this visit.    PHYSICAL EXAMINATION: ECOG PERFORMANCE STATUS: 0 - Asymptomatic  BP 129/76 mmHg  Pulse 76  Temp(Src) 96.9 F (36.1 C) (Tympanic)  Resp 18  Wt 159 lb 13.3 oz (72.5 kg)  SpO2   Filed Weights   01/19/16 1047  Weight: 159 lb 13.3 oz (72.5 kg)    GENERAL: Well-nourished well-developed; Alert, no distress and comfortable.  Alone.  EYES: no pallor or icterus OROPHARYNX: no thrush or ulceration; good dentition  NECK: supple, no masses felt LYMPH:  no palpable lymphadenopathy in the cervical, axillary or inguinal regions LUNGS: clear to auscultation and  No wheeze or crackles HEART/CVS: regular rate & rhythm and no murmurs; No lower extremity edema ABDOMEN:abdomen soft, non-tender and normal bowel sounds Musculoskeletal:no cyanosis of digits and no clubbing  PSYCH: alert & oriented x 3 with fluent speech NEURO: no focal motor/sensory deficits SKIN:  no rashes or significant lesions  LABORATORY DATA:  I have reviewed the data as listed    Component Value Date/Time   NA 140 01/19/2016 1020   NA 137 11/12/2014 1415   K 3.8 01/19/2016 1020   K 4.1 11/12/2014 1415   CL 106 01/19/2016 1020   CL 105 11/12/2014 1415   CO2 28 01/19/2016 1020   CO2 24 11/12/2014 1415   GLUCOSE 93 01/19/2016 1020   GLUCOSE 98 11/12/2014 1415   BUN 21* 01/19/2016 1020   BUN 24* 11/12/2014 1415   CREATININE 0.90 01/19/2016 1020   CREATININE 1.02 11/12/2014 1415   CALCIUM 9.4 01/19/2016 1020   CALCIUM 9.2 11/12/2014 1415   PROT 7.4 01/19/2016 1020   PROT 7.4 11/12/2014 1415   ALBUMIN 4.4 01/19/2016 1020   ALBUMIN 4.4 11/12/2014 1415   AST 15 01/19/2016 1020   AST 23 11/12/2014 1415   ALT 10* 01/19/2016 1020   ALT 16* 11/12/2014 1415   ALKPHOS 60 01/19/2016 1020   ALKPHOS 103 11/12/2014  1415   BILITOT 0.7 01/19/2016 1020   BILITOT 0.4 11/12/2014 1415   GFRNONAA >60 01/19/2016 1020   GFRNONAA >60 11/12/2014 1415   GFRAA >60 01/19/2016 1020   GFRAA >60 11/12/2014 1415    No results found for: SPEP, UPEP  Lab Results  Component Value Date   WBC 7.1 01/19/2016   NEUTROABS 5.1 01/19/2016   HGB 14.8 01/19/2016   HCT 41.7 01/19/2016   MCV 97.0 01/19/2016   PLT 220 01/19/2016      Chemistry      Component Value Date/Time   NA 140 01/19/2016 1020   NA 137 11/12/2014 1415   K 3.8 01/19/2016 1020   K 4.1 11/12/2014 1415   CL 106 01/19/2016 1020  CL 105 11/12/2014 1415   CO2 28 01/19/2016 1020   CO2 24 11/12/2014 1415   BUN 21* 01/19/2016 1020   BUN 24* 11/12/2014 1415   CREATININE 0.90 01/19/2016 1020   CREATININE 1.02 11/12/2014 1415      Component Value Date/Time   CALCIUM 9.4 01/19/2016 1020   CALCIUM 9.2 11/12/2014 1415   ALKPHOS 60 01/19/2016 1020   ALKPHOS 103 11/12/2014 1415   AST 15 01/19/2016 1020   AST 23 11/12/2014 1415   ALT 10* 01/19/2016 1020   ALT 16* 11/12/2014 1415   BILITOT 0.7 01/19/2016 1020   BILITOT 0.4 11/12/2014 1415       RADIOGRAPHIC STUDIES: I have personally reviewed the radiological images as listed and agreed with the findings in the report. No results found.   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 Also on Zytiga plus Xtandi- as per clinical trial. Most recent PSA 15 awaiting today. Reviewed the CT scan of the chest and pelvis/bone scan- stable lymphadenopathy bone lesions. Patient tolerating treatment very well.  # Right flank pain history of kidney stone; currently none if it recurs recommend follow-up with urology  # Bone lesions continue Xgeva; no side effects noted. Continue calcium and vitamin D.  Follow up with me in 4 weeks/ labs. Spoke to clinical trial RN.    Orders  Placed This Encounter  Procedures  . CBC with Differential    Standing Status: Future     Number of Occurrences:      Standing Expiration Date: 01/18/2017  . Comprehensive metabolic panel    Standing Status: Future     Number of Occurrences:      Standing Expiration Date: 01/18/2017    Order Specific Question:  Has the patient fasted?    Answer:  No  . PSA    Standing Status: Future     Number of Occurrences:      Standing Expiration Date: 01/18/2017   All questions were answered. The patient knows to call the clinic with any problems, questions or concerns.      Cammie Sickle, MD 01/19/2016 6:34 PM

## 2016-01-19 NOTE — Assessment & Plan Note (Signed)
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 Also on Zytiga plus Xtandi- as per clinical trial. Most recent PSA 15 awaiting today. Reviewed the CT scan of the chest and pelvis/bone scan- stable lymphadenopathy bone lesions. Patient tolerating treatment very well.  # Right flank pain history of kidney stone; currently none if it recurs recommend follow-up with urology  # Bone lesions continue Xgeva; no side effects noted. Continue calcium and vitamin D.  Follow up with me in 4 weeks/ labs. Spoke to clinical trial RN.

## 2016-01-19 NOTE — Progress Notes (Signed)
Patient states he is having problems with sinus drainage and has a cough.

## 2016-01-26 ENCOUNTER — Telehealth: Payer: Self-pay | Admitting: *Deleted

## 2016-01-26 NOTE — Telephone Encounter (Signed)
Received the following email communication for the patient William Wright this morning: Hi Ulis Rias! Thanks! I'll try to capsule this... when I first was diagnosed with cancer, I went to [facility] in [place] for a second opinion on treatment and spent 3 days with a team of drs. One of the things the urologist and onocologist said was that they always gave prostate cancer patients either viagra or cialis...not just for the ED factor but because it increases blood flow to the prostate.They contacted Dr Rogers Blocker and he gave me a 30 day sample and a prescription. I checked with my insurance, [employer] at the time, and they would not pay for the prescription. The dr at the [facility] filed an appeal, but [employer] turned it down again. So I never even took the sample. Packing for the beach I came across the sample. So my question is does Dr B think this is something that would help the cancer and ED, and would my Med[employer] and [employer] pay?  Hope you are having a good week!  I am!  Abbe Amsterdam   On Wed, Jan 26, 2016 at 8:16 AM, [name] [email or web address]> wrote:   After speaking to Dr. [name] about this question, the following response was emailed back to Mr. Tseng:  Ayesha Rumpf,  I talked to Dr. Jacinto Reap and he said he had never heard of this treatment for prostate cancer before, and that he wouldn't recommend it himself. He said that if you want it, you will have to get Dr. Rogers Blocker to prescribe it for you.  [name], BSN, MHA, [place] [facility] Research Nurse  Direct Dial: [phone]  Fax: [phone]  Website: [email or web address]  [name], BSN, MHA, OCN 01/26/2016 4:33 PM

## 2016-02-02 ENCOUNTER — Inpatient Hospital Stay: Payer: Medicare Other

## 2016-02-02 ENCOUNTER — Other Ambulatory Visit: Payer: Self-pay | Admitting: Internal Medicine

## 2016-02-02 DIAGNOSIS — C61 Malignant neoplasm of prostate: Secondary | ICD-10-CM | POA: Diagnosis not present

## 2016-02-02 DIAGNOSIS — R591 Generalized enlarged lymph nodes: Secondary | ICD-10-CM | POA: Diagnosis not present

## 2016-02-02 DIAGNOSIS — Z006 Encounter for examination for normal comparison and control in clinical research program: Secondary | ICD-10-CM | POA: Diagnosis not present

## 2016-02-02 DIAGNOSIS — C7951 Secondary malignant neoplasm of bone: Secondary | ICD-10-CM | POA: Diagnosis not present

## 2016-02-02 DIAGNOSIS — Z7952 Long term (current) use of systemic steroids: Secondary | ICD-10-CM | POA: Diagnosis not present

## 2016-02-02 DIAGNOSIS — Z79818 Long term (current) use of other agents affecting estrogen receptors and estrogen levels: Secondary | ICD-10-CM | POA: Diagnosis not present

## 2016-02-02 MED ORDER — DENOSUMAB 120 MG/1.7ML ~~LOC~~ SOLN
120.0000 mg | Freq: Once | SUBCUTANEOUS | Status: AC
  Administered 2016-02-02: 120 mg via SUBCUTANEOUS
  Filled 2016-02-02: qty 1.7

## 2016-02-02 MED ORDER — TRIPTORELIN PAMOATE 22.5 MG IM SUSR
22.5000 mg | Freq: Once | INTRAMUSCULAR | Status: AC
  Administered 2016-02-02: 22.5 mg via INTRAMUSCULAR
  Filled 2016-02-02: qty 22.5

## 2016-02-02 NOTE — Progress Notes (Signed)
Xgeva ordered today, no labs.  Use last labs per MD.

## 2016-02-16 ENCOUNTER — Encounter: Payer: Self-pay | Admitting: *Deleted

## 2016-02-16 ENCOUNTER — Inpatient Hospital Stay: Payer: Medicare Other | Attending: Internal Medicine

## 2016-02-16 ENCOUNTER — Inpatient Hospital Stay (HOSPITAL_BASED_OUTPATIENT_CLINIC_OR_DEPARTMENT_OTHER): Payer: Medicare Other | Admitting: Internal Medicine

## 2016-02-16 VITALS — BP 146/85 | HR 66 | Temp 97.1°F | Resp 18 | Wt 162.2 lb

## 2016-02-16 DIAGNOSIS — Z79818 Long term (current) use of other agents affecting estrogen receptors and estrogen levels: Secondary | ICD-10-CM | POA: Diagnosis not present

## 2016-02-16 DIAGNOSIS — Z7952 Long term (current) use of systemic steroids: Secondary | ICD-10-CM | POA: Insufficient documentation

## 2016-02-16 DIAGNOSIS — Z79899 Other long term (current) drug therapy: Secondary | ICD-10-CM

## 2016-02-16 DIAGNOSIS — C61 Malignant neoplasm of prostate: Secondary | ICD-10-CM

## 2016-02-16 DIAGNOSIS — C7951 Secondary malignant neoplasm of bone: Secondary | ICD-10-CM | POA: Diagnosis not present

## 2016-02-16 DIAGNOSIS — Z006 Encounter for examination for normal comparison and control in clinical research program: Secondary | ICD-10-CM | POA: Diagnosis not present

## 2016-02-16 DIAGNOSIS — Z7982 Long term (current) use of aspirin: Secondary | ICD-10-CM

## 2016-02-16 LAB — COMPREHENSIVE METABOLIC PANEL
ALBUMIN: 4.4 g/dL (ref 3.5–5.0)
ALT: 12 U/L — AB (ref 17–63)
AST: 16 U/L (ref 15–41)
Alkaline Phosphatase: 56 U/L (ref 38–126)
Anion gap: 5 (ref 5–15)
BILIRUBIN TOTAL: 1.1 mg/dL (ref 0.3–1.2)
BUN: 18 mg/dL (ref 6–20)
CHLORIDE: 103 mmol/L (ref 101–111)
CO2: 30 mmol/L (ref 22–32)
CREATININE: 0.96 mg/dL (ref 0.61–1.24)
Calcium: 9.1 mg/dL (ref 8.9–10.3)
GFR calc Af Amer: >60 mL/min (ref >60)
GLUCOSE: 93 mg/dL (ref 65–99)
POTASSIUM: 4.1 mmol/L (ref 3.5–5.1)
Sodium: 138 mmol/L (ref 135–145)
Total Protein: 7.3 g/dL (ref 6.5–8.1)

## 2016-02-16 LAB — CBC WITH DIFFERENTIAL/PLATELET
BASOS ABS: 0 10*3/uL (ref 0–0.1)
BASOS PCT: 1 %
EOS ABS: 0.1 10*3/uL (ref 0–0.7)
EOS PCT: 1 %
HCT: 40.8 % (ref 40.0–52.0)
Hemoglobin: 14.2 g/dL (ref 13.0–18.0)
Lymphocytes Relative: 42 %
Lymphs Abs: 1.7 10*3/uL (ref 1.0–3.6)
MCH: 33.9 pg (ref 26.0–34.0)
MCHC: 34.8 g/dL (ref 32.0–36.0)
MCV: 97.5 fL (ref 80.0–100.0)
MONO ABS: 0.5 10*3/uL (ref 0.2–1.0)
Monocytes Relative: 13 %
NEUTROS ABS: 1.8 10*3/uL (ref 1.4–6.5)
Neutrophils Relative %: 43 %
PLATELETS: 197 10*3/uL (ref 150–440)
RBC: 4.19 MIL/uL — ABNORMAL LOW (ref 4.40–5.90)
RDW: 13.5 % (ref 11.5–14.5)
WBC: 4 10*3/uL (ref 3.8–10.6)

## 2016-02-16 LAB — PSA: PSA: 14.96 ng/mL — ABNORMAL HIGH (ref 0.00–4.00)

## 2016-02-16 MED ORDER — ABIRATERONE ACETATE 250 MG PO TABS: 1000.0000 mg | ORAL_TABLET | Freq: Every day | ORAL | 0 refills | Status: DC

## 2016-02-16 MED ORDER — PREDNISONE 5 MG PO TABS: 5.0000 mg | ORAL_TABLET | Freq: Two times a day (BID) | ORAL | 0 refills | Status: DC

## 2016-02-16 NOTE — Progress Notes (Signed)
[name] returns to clinic this morning for consideration of his cycle 17, Day 1 treatment on the [facility] [phone] Prostate study for which he receives Enzalutamide, Abiraterone and Prednisone. He denies any further right flank pain but still reports a cough from sinus/allergy problems, which was occasionally productive of clear to cream to green color at times; but he states this is almost gone now. B/P slightly elevated at 146/85, but VS are otherwise stable. His hematology and chemistry results are within acceptable parameters. He denies any problems tolerating his medication and has returned his completed medication diaries along with the PK worksheet, and his Enzalutamide bottle with 8 capsules remaining. Both of these indicate that he has taken all of the study drug as prescribed. [name] states he has  made an appointment with his dentist on August 18th per Dr. Metro Kung earlier  recommendation.Continues to report he is experiencing allergy related sinus issues. Dr. [name] suggested he take OTC Zyrtec, Allegra or Claritin on his last clinic visit, and patient states he got some generic sinus/allergy medication from [facility]. He  states he did not call Dr. Eliberto Ivory and report the flank pain since that had resolved. Dr. [name] in to examine patient and voices concern that his PSA had increased some on the last visit. Informed that the study does not use the PSA levels to determine progression, only the CT and bone scans - which have continued to show stable disease. Old bottle of Enzalutamide returned to the pharmacy and a new bottle of 120 capsules of study drug Enzalutamide was dispensed by the pharmacist and given to the patient along with new medication diaries. William Wright has been scheduled to return to clinic on 03/16/16 for his new cycle of Enzalutamide, Abiraterone and Prednisone and will receive Xgeva at that time as well. Adverse events were assessed as follows:    Adverse  Event Log  Study/Protocol: Alliance [id number] Cycle: 16 adverse events  Event Grade Onset Date Resolved Date Drug Name Enzalutamide + Abiraterone Attribution Treatment Comments  Fatigue Grade 1 11/25/14   Probably    Hot Flashes Grade 1 11/25/14   Unrelated  r/t HST  Weight loss Grade 1 06/09/15   Possible  stable  Arm pain (bone) Grade 1 unk   Possible  Reports almost resolved  Cough Grade 1 01/22/16   Unrelated  Improved, but occ productive   [employer], BSN, [place], OCN 02/16/2016 12:03 PM

## 2016-02-16 NOTE — Progress Notes (Signed)
[facility] OFFICE PROGRESS NOTE  Patient Care Team: Provider Not In System as PCP - General Royston Cowper, MD (Urology)  Cancer of prostate [facility])   Staging form: Prostate, AJCC 7th Edition     Clinical: Stage IV (T2, M1) - Signed by Evlyn Kanner, NP on 01/07/2015    Oncology History   # FEB 2014- Prostate cancer Stage II [T2N0]; PA-18;   # May 2015- PSA 90 on ADT; CT/Bone scan-extensive mets;  casodex+ Trelstar  # June 2016- Castration resistant prostate cancer/Alliance protocol- ZYTIGA plus minus XTANDI; June 28th-CT-C/A/P- Stable Retrocrural/RP/Pelvic LN;Bone scan- Stable sclerotic lesions   # Xgeva     Cancer of prostate ([place])   08/17/2012 Initial Diagnosis    Cancer of prostate, stage II     12/03/2013 Progression         10/13/2014 Progression          Prostate cancer metastatic to multiple sites ([place])   01/19/2016 Initial Diagnosis    Prostate cancer metastatic to multiple sites [facility])       INTERVAL HISTORY:  HARVIN MCCULLY 65 y.o.  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  No swelling in the legs. No shortness of breath. No chest pain. Flank pain resolved. No seizures. Appetite is good. Denies any pain. No nausea or vomiting. No significant fatigue.   REVIEW OF SYSTEMS:  A complete 10 point review of system is done which is negative except mentioned above/history of present illness.   PAST MEDICAL HISTORY :  Past Medical History:  Diagnosis Date  . Cancer of prostate ([place]) 11/18/2014  . Prostate cancer ([place])     PAST SURGICAL HISTORY :   Past Surgical History:  Procedure Laterality Date  . EXTRACORPOREAL SHOCK WAVE LITHOTRIPSY Left 05/20/2015   Procedure: EXTRACORPOREAL SHOCK WAVE LITHOTRIPSY (ESWL);  Surgeon: Royston Cowper, MD;  Location: [facility] ORS;  Service: Urology;  Laterality: Left;    FAMILY HISTORY :  No  family history on file.  SOCIAL HISTORY:   Social History  Substance Use Topics  . Smoking status: Never Smoker  . Smokeless tobacco: Not on file  . Alcohol use No    ALLERGIES:  is allergic to no known allergies.  MEDICATIONS:  Current Outpatient Prescriptions  Medication Sig Dispense Refill  . abiraterone Acetate (ZYTIGA) 250 MG tablet Take 4 tablets (1,000 mg total) by mouth daily. Take on an empty stomach 1 hour before or 2 hours after a meal 120 tablet 0  . fluticasone (FLONASE) 50 MCG/ACT nasal spray Place 1 spray into both nostrils daily. 16 g 2  . hydrocortisone (ANUSOL-HC) 25 MG suppository Place 1 suppository (25 mg total) rectally 2 (two) times daily as needed for hemorrhoids or itching. 12 suppository 0  . Investigational enzalutamide 40 MG capsule ALLIANCE [id number] Take 4 capsules (160 mg total) by mouth daily. Take with or without food.Swallow whole. Do not crush, or open capsules. 120 capsule 0  . Investigational enzalutamide 40 MG capsule ALLIANCE [id number] Take 4 capsules (160 mg total) by mouth daily. Take with or without food.Swallow whole. Do not crush, or open capsules. 120 capsule 0  . NUCYNTA 50 MG TABS tablet Take 1 tablet (50 mg total) by mouth every 6 (six) hours as needed for moderate pain. 1 TO 2 TABS Q 6 HOURS PRN PAIN 30 tablet 0  . ondansetron (ZOFRAN ODT) 8 MG disintegrating tablet  Take 1 tablet (8 mg total) by mouth every 6 (six) hours as needed for nausea or vomiting. 10 tablet 3  . OxyCODONE HCl, Abuse Deter, (OXAYDO) 5 MG TABA Take 1 tablet by mouth every 4 (four) hours as needed. 30 tablet 0  . predniSONE (DELTASONE) 5 MG tablet Take 1 tablet (5 mg total) by mouth 2 (two) times daily. 60 tablet 0  . tamsulosin (FLOMAX) 0.4 MG CAPS capsule Take 1 capsule (0.4 mg total) by mouth daily. 30 capsule 11  . Triptorelin Pamoate (TRELSTAR) 22.5 MG injection Inject 22.5 mg into the muscle every 6 (six) months.    Marland Kitchen abiraterone Acetate (ZYTIGA) 250 MG tablet Take 4  tablets (1,000 mg total) by mouth daily. Take on an empty stomach 1 hour before or 2 hours after a meal 120 tablet 0  . predniSONE (DELTASONE) 5 MG tablet Take 1 tablet (5 mg total) by mouth 2 (two) times daily. 60 tablet 0   No current facility-administered medications for this visit.     PHYSICAL EXAMINATION: ECOG PERFORMANCE STATUS: 0 - Asymptomatic  BP (!) 146/85 (BP Location: Left Arm, Patient Position: Sitting)   Pulse 66   Temp 97.1 F (36.2 C) (Tympanic)   Resp 18   Wt 162 lb 4 oz (73.6 kg)   BMI 23.28 kg/m   Filed Weights   02/16/16 1058  Weight: 162 lb 4 oz (73.6 kg)    GENERAL: Well-nourished well-developed; Alert, no distress and comfortable.  Alone.  EYES: no pallor or icterus OROPHARYNX: no thrush or ulceration; good dentition  NECK: supple, no masses felt LYMPH:  no palpable lymphadenopathy in the cervical, axillary or inguinal regions LUNGS: clear to auscultation and  No wheeze or crackles HEART/CVS: regular rate & rhythm and no murmurs; No lower extremity edema ABDOMEN:abdomen soft, non-tender and normal bowel sounds Musculoskeletal:no cyanosis of digits and no clubbing  PSYCH: alert & oriented x 3 with fluent speech NEURO: no focal motor/sensory deficits SKIN:  no rashes or significant lesions  LABORATORY DATA:  I have reviewed the data as listed    Component Value Date/Time   NA 138 02/16/2016 1033   NA 137 11/12/2014 1415   K 4.1 02/16/2016 1033   K 4.1 11/12/2014 1415   CL 103 02/16/2016 1033   CL 105 11/12/2014 1415   CO2 30 02/16/2016 1033   CO2 24 11/12/2014 1415   GLUCOSE 93 02/16/2016 1033   GLUCOSE 98 11/12/2014 1415   BUN 18 02/16/2016 1033   BUN 24 (H) 11/12/2014 1415   CREATININE 0.96 02/16/2016 1033   CREATININE 1.02 11/12/2014 1415   CALCIUM 9.1 02/16/2016 1033   CALCIUM 9.2 11/12/2014 1415   PROT 7.3 02/16/2016 1033   PROT 7.4 11/12/2014 1415   ALBUMIN 4.4 02/16/2016 1033   ALBUMIN 4.4 11/12/2014 1415   AST 16 02/16/2016  1033   AST 23 11/12/2014 1415   ALT 12 (L) 02/16/2016 1033   ALT 16 (L) 11/12/2014 1415   ALKPHOS 56 02/16/2016 1033   ALKPHOS 103 11/12/2014 1415   BILITOT 1.1 02/16/2016 1033   BILITOT 0.4 11/12/2014 1415   GFRNONAA >60 02/16/2016 1033   GFRNONAA >60 11/12/2014 1415   GFRAA >60 02/16/2016 1033   GFRAA >60 11/12/2014 1415    No results found for: SPEP, UPEP  Lab Results  Component Value Date   WBC 4.0 02/16/2016   NEUTROABS 1.8 02/16/2016   HGB 14.2 02/16/2016   HCT 40.8 02/16/2016   MCV 97.5 02/16/2016  PLT 197 02/16/2016      Chemistry      Component Value Date/Time   NA 138 02/16/2016 1033   NA 137 11/12/2014 1415   K 4.1 02/16/2016 1033   K 4.1 11/12/2014 1415   CL 103 02/16/2016 1033   CL 105 11/12/2014 1415   CO2 30 02/16/2016 1033   CO2 24 11/12/2014 1415   BUN 18 02/16/2016 1033   BUN 24 (H) 11/12/2014 1415   CREATININE 0.96 02/16/2016 1033   CREATININE 1.02 11/12/2014 1415      Component Value Date/Time   CALCIUM 9.1 02/16/2016 1033   CALCIUM 9.2 11/12/2014 1415   ALKPHOS 56 02/16/2016 1033   ALKPHOS 103 11/12/2014 1415   AST 16 02/16/2016 1033   AST 23 11/12/2014 1415   ALT 12 (L) 02/16/2016 1033   ALT 16 (L) 11/12/2014 1415   BILITOT 1.1 02/16/2016 1033   BILITOT 0.4 11/12/2014 1415       RADIOGRAPHIC STUDIES: I have personally reviewed the radiological images as listed and agreed with the findings in the report. No results found.   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 Also on Zytiga plus Xtandi- as per clinical trial. Most recent PSA 17.   # Patient tolerating treatment very well. Sep 2017- CT scans due again.  # Bone lesions continue Xgeva; no side effects noted. Continue calcium and vitamin D.  Follow up with me in 4 weeks/ labs/X-geva. Spoke to clinical trial RN.    No orders of the  defined types were placed in this encounter.  All questions were answered. The patient knows to call the clinic with any problems, questions or concerns.      Cammie Sickle, MD 02/16/2016 1:20 PM

## 2016-02-16 NOTE — Assessment & Plan Note (Addendum)
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 Also on Zytiga plus Xtandi- as per clinical trial. Most recent PSA 17.   # Patient tolerating treatment very well. Sep 2017- CT scans due again.  # Bone lesions continue Xgeva; no side effects noted. Continue calcium and vitamin D.  Follow up with me in 4 weeks/ labs/X-geva. Spoke to clinical trial RN.

## 2016-03-03 ENCOUNTER — Telehealth: Payer: Self-pay | Admitting: *Deleted

## 2016-03-03 NOTE — Telephone Encounter (Signed)
Received t/c from patient stating he went to the dentist today and was told that he needs a "deep cleaning" which costs $1280. He states they would not clean his teeth and told him he will need to take antibiotics prior to having them cleaned. Reports they did do xrays and did not find any jaw necrosis, and did an oral exam, which he reports was OK. He will look for another dentist who offers basic dental care to have his teeth cleaned. [name], BSN, MHA, OCN 03/03/2016 3:12 PM

## 2016-03-10 ENCOUNTER — Other Ambulatory Visit: Payer: Self-pay | Admitting: *Deleted

## 2016-03-10 DIAGNOSIS — C61 Malignant neoplasm of prostate: Secondary | ICD-10-CM

## 2016-03-10 MED ORDER — ABIRATERONE ACETATE 250 MG PO TABS: 1000.0000 mg | ORAL_TABLET | Freq: Every day | ORAL | 4 refills | Status: DC

## 2016-03-14 ENCOUNTER — Other Ambulatory Visit: Payer: Self-pay | Admitting: *Deleted

## 2016-03-14 DIAGNOSIS — C61 Malignant neoplasm of prostate: Secondary | ICD-10-CM

## 2016-03-14 MED ORDER — ABIRATERONE ACETATE 250 MG PO TABS: 1000.0000 mg | ORAL_TABLET | Freq: Every day | ORAL | 4 refills | Status: DC

## 2016-03-14 NOTE — Addendum Note (Signed)
Addended by: Betti Cruz on: 03/14/2016 10:32 AM   Modules accepted: Orders

## 2016-03-16 ENCOUNTER — Inpatient Hospital Stay: Payer: Medicare Other

## 2016-03-16 ENCOUNTER — Inpatient Hospital Stay (HOSPITAL_BASED_OUTPATIENT_CLINIC_OR_DEPARTMENT_OTHER): Payer: Medicare Other | Admitting: Internal Medicine

## 2016-03-16 VITALS — BP 134/74 | HR 59 | Temp 96.9°F | Resp 15 | Wt 165.0 lb

## 2016-03-16 DIAGNOSIS — Z7952 Long term (current) use of systemic steroids: Secondary | ICD-10-CM | POA: Diagnosis not present

## 2016-03-16 DIAGNOSIS — C61 Malignant neoplasm of prostate: Secondary | ICD-10-CM

## 2016-03-16 DIAGNOSIS — C7951 Secondary malignant neoplasm of bone: Secondary | ICD-10-CM | POA: Diagnosis not present

## 2016-03-16 DIAGNOSIS — Z006 Encounter for examination for normal comparison and control in clinical research program: Secondary | ICD-10-CM | POA: Diagnosis not present

## 2016-03-16 DIAGNOSIS — Z79818 Long term (current) use of other agents affecting estrogen receptors and estrogen levels: Secondary | ICD-10-CM

## 2016-03-16 DIAGNOSIS — Z79899 Other long term (current) drug therapy: Secondary | ICD-10-CM

## 2016-03-16 LAB — CBC WITH DIFFERENTIAL/PLATELET
BASOS ABS: 0 10*3/uL (ref 0–0.1)
Basophils Relative: 1 %
EOS PCT: 1 %
Eosinophils Absolute: 0.1 10*3/uL (ref 0–0.7)
HCT: 41.7 % (ref 40.0–52.0)
HEMOGLOBIN: 14.7 g/dL (ref 13.0–18.0)
LYMPHS ABS: 1.6 10*3/uL (ref 1.0–3.6)
LYMPHS PCT: 38 %
MCH: 34.5 pg — ABNORMAL HIGH (ref 26.0–34.0)
MCHC: 35.3 g/dL (ref 32.0–36.0)
MCV: 97.9 fL (ref 80.0–100.0)
Monocytes Absolute: 0.6 10*3/uL (ref 0.2–1.0)
Monocytes Relative: 13 %
NEUTROS PCT: 47 %
Neutro Abs: 2 10*3/uL (ref 1.4–6.5)
PLATELETS: 212 10*3/uL (ref 150–440)
RBC: 4.26 MIL/uL — AB (ref 4.40–5.90)
RDW: 13.2 % (ref 11.5–14.5)
WBC: 4.3 10*3/uL (ref 3.8–10.6)

## 2016-03-16 LAB — PSA: PSA: 13.12 ng/mL — ABNORMAL HIGH (ref 0.00–4.00)

## 2016-03-16 LAB — COMPREHENSIVE METABOLIC PANEL
ALK PHOS: 52 U/L (ref 38–126)
ALT: 12 U/L — AB (ref 17–63)
AST: 15 U/L (ref 15–41)
Albumin: 4.5 g/dL (ref 3.5–5.0)
Anion gap: 9 (ref 5–15)
BUN: 21 mg/dL — AB (ref 6–20)
CALCIUM: 9.2 mg/dL (ref 8.9–10.3)
CHLORIDE: 103 mmol/L (ref 101–111)
CO2: 27 mmol/L (ref 22–32)
CREATININE: 0.98 mg/dL (ref 0.61–1.24)
GFR calc Af Amer: >60 mL/min (ref >60)
Glucose, Bld: 98 mg/dL (ref 65–99)
Potassium: 4.1 mmol/L (ref 3.5–5.1)
SODIUM: 139 mmol/L (ref 135–145)
Total Bilirubin: 1 mg/dL (ref 0.3–1.2)
Total Protein: 7.2 g/dL (ref 6.5–8.1)

## 2016-03-16 MED ORDER — DENOSUMAB 120 MG/1.7ML ~~LOC~~ SOLN
120.0000 mg | Freq: Once | SUBCUTANEOUS | Status: AC
  Administered 2016-03-16: 120 mg via SUBCUTANEOUS
  Filled 2016-03-16: qty 1.7

## 2016-03-16 NOTE — Progress Notes (Signed)
The patient returns to clinic today accompanied by his two grandchildren for consideration of his cycle 39, Day 1 treatment on the [facility] [phone] Prostate study for which her receives Enzalutamide, Abiraterone, and Prednisone. VS are stable today. Patient states the flank pain has resolved. He complains of increased fatigue. Patient states he is more tired in the middle of the day now. He also reports that his arm/shoulder pain happens rarely now and he is using a cream that is helping a lot. The patient states that his allergy symptoms and cough have also resolved since last visit. He reports occasional mild headaches but states "they are not bad" and that he continues to have hot flashes both day and night. He denies any problems tolerating his medication and returned his completed medication diaries along with the PK worksheet, and his Enzalutamide bottle with 4 capsules remaining. He has taken all medication/study drug as prescribed and as indicated by the medication diaries. Dr. [name] in to examine the patient and to review labs. Blood counts look good today according to Dr. [name]. He reports that he saw a dentist on August 18th for a routine appointment. Dentist recommended a deep cleaning which patient refused due to the cost of the procedure. Dr. [name] states that his PSA had increased at last visit on 02/16/2016 but that it is stable for now. CT and  Bone Scan are scheduled for April 05, 2016 and his next visit scheduled to see Dr. [name], get labs, receive Delton See and review scans is April 12, 2016. His returned bottle of Enzalutamide was returned to the pharmacy and a new bottle of 120 capsules of study drug Enzalutamide was dispensed by the pharmacist and given to the patient along with new medication diaries.  Adverse events were assessed as follows:    Adverse Event Log  Study/Protocol: Alliance [id number] Cycle: 17 adverse events  Event Grade Onset Date Resolved Date Drug  Name Enzalutamide + Abiraterone Attribution Treatment Comments  Fatigue Grade 1 11/25/14   Probably    Hot Flashes Grade 1 11/25/14   Unrelated  r/t HST  Weight loss Grade 1 06/09/15   Possible  stable  Arm pain (bone) Grade 1 unk   Possible  Reports almost resolved  Cough Grade 1 01/22/16 02/16/2016  Unrelated  Improved, but occ productive  Mild Headaches Grade 1 unknown   Unrelated     Mirian Mo, RN, BSN 03/16/2016 12:03 PM

## 2016-03-16 NOTE — Assessment & Plan Note (Signed)
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 Also on Zytiga plus Xtandi- as per clinical trial. Most AUG 2017 PSA 15.9   # Patient tolerating treatment very well. Sep 2017- CT scans due again.  # Bone lesions continue Xgeva; no side effects noted. Continue calcium and vitamin D.  Follow up with me in 4 weeks/ labs/X-geva. Spoke to clinical trial RN. Sep 27th follow up.

## 2016-03-16 NOTE — Progress Notes (Signed)
Patient states he continues to have fatigue and hot flashes.

## 2016-03-16 NOTE — Progress Notes (Signed)
[place] OFFICE PROGRESS NOTE  Patient Care Team: Provider Not In System as PCP - General Royston Cowper, MD (Urology)  Cancer of prostate [facility])   Staging form: Prostate, AJCC 7th Edition     Clinical: Stage IV (T2, M1) - Signed by Evlyn Kanner, NP on 01/07/2015    Oncology History   # FEB 2014- Prostate cancer Stage II [T2N0]; PA-18;   # May 2015- PSA 90 on ADT; CT/Bone scan-extensive mets;  casodex+ Trelstar  # June 2016- Castration resistant prostate cancer/[facility] protocol- ZYTIGA plus minus XTANDI; June 28th-CT-C/A/P- Stable Retrocrural/RP/Pelvic LN;Bone scan- Stable sclerotic lesions   # Xgeva     Cancer of prostate ([place])   08/17/2012 Initial Diagnosis    Cancer of prostate, stage II      12/03/2013 Progression         10/13/2014 Progression          Prostate cancer metastatic to multiple sites ([place])   01/19/2016 Initial Diagnosis    Prostate cancer metastatic to multiple sites [facility])        INTERVAL HISTORY:  William Wright 65 y.o.  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  Denies any bone pain. Denies any jaw pain. Denies any significant hot flashes. No swelling in the legs. No shortness of breath. No chest pain. No seizures. Appetite is good. Denies any pain. No nausea or vomiting. No significant fatigue.   REVIEW OF SYSTEMS:  A complete 10 point review of system is done which is negative except mentioned above/history of present illness.   PAST MEDICAL HISTORY :  Past Medical History:  Diagnosis Date  . Cancer of prostate ([place]) 11/18/2014  . Prostate cancer ([place])     PAST SURGICAL HISTORY :   Past Surgical History:  Procedure Laterality Date  . EXTRACORPOREAL SHOCK WAVE LITHOTRIPSY Left 05/20/2015   Procedure: EXTRACORPOREAL SHOCK WAVE LITHOTRIPSY (ESWL);  Surgeon: Royston Cowper, MD;  Location: [facility] ORS;   Service: Urology;  Laterality: Left;    FAMILY HISTORY :  No family history on file.  SOCIAL HISTORY:   Social History  Substance Use Topics  . Smoking status: Never Smoker  . Smokeless tobacco: Not on file  . Alcohol use No    ALLERGIES:  is allergic to no known allergies.  MEDICATIONS:  Current Outpatient Prescriptions  Medication Sig Dispense Refill  . abiraterone Acetate (ZYTIGA) 250 MG tablet Take 4 tablets (1,000 mg total) by mouth daily. Take on an empty stomach 1 hour before or 2 hours after a meal 120 tablet 4  . fluticasone (FLONASE) 50 MCG/ACT nasal spray Place 1 spray into both nostrils daily. 16 g 2  . hydrocortisone (ANUSOL-HC) 25 MG suppository Place 1 suppository (25 mg total) rectally 2 (two) times daily as needed for hemorrhoids or itching. 12 suppository 0  . Investigational enzalutamide 40 MG capsule [facility] [id number] Take 4 capsules (160 mg total) by mouth daily. Take with or without food.Swallow whole. Do not crush, or open capsules. 120 capsule 0  . Investigational enzalutamide 40 MG capsule [facility] [id number] Take 4 capsules (160 mg total) by mouth daily. Take with or without food.Swallow whole. Do not crush, or open capsules. 120 capsule 0  . NUCYNTA 50 MG TABS tablet Take 1 tablet (50 mg total) by mouth every 6 (six) hours as needed for moderate pain. 1 TO 2 TABS Q 6 HOURS PRN PAIN  30 tablet 0  . ondansetron (ZOFRAN ODT) 8 MG disintegrating tablet Take 1 tablet (8 mg total) by mouth every 6 (six) hours as needed for nausea or vomiting. 10 tablet 3  . OxyCODONE HCl, Abuse Deter, (OXAYDO) 5 MG TABA Take 1 tablet by mouth every 4 (four) hours as needed. 30 tablet 0  . predniSONE (DELTASONE) 5 MG tablet Take 1 tablet (5 mg total) by mouth 2 (two) times daily. 60 tablet 0  . predniSONE (DELTASONE) 5 MG tablet Take 1 tablet (5 mg total) by mouth 2 (two) times daily. 60 tablet 0  . tamsulosin (FLOMAX) 0.4 MG CAPS capsule Take 1 capsule (0.4 mg total) by mouth daily. 30  capsule 11  . Triptorelin Pamoate (TRELSTAR) 22.5 MG injection Inject 22.5 mg into the muscle every 6 (six) months.     No current facility-administered medications for this visit.     PHYSICAL EXAMINATION: ECOG PERFORMANCE STATUS: 0 - Asymptomatic  BP 134/74 (BP Location: Left Arm, Patient Position: Sitting)   Pulse (!) 59   Temp (!) 96.9 F (36.1 C) (Tympanic)   Resp 15   Wt 165 lb (74.8 kg)   BMI 23.68 kg/m   Filed Weights   03/16/16 1047  Weight: 165 lb (74.8 kg)    GENERAL: Well-nourished well-developed; Alert, no distress and comfortable. Accompanied by grandchildren. EYES: no pallor or icterus OROPHARYNX: no thrush or ulceration; good dentition  NECK: supple, no masses felt LYMPH:  no palpable lymphadenopathy in the cervical, axillary or inguinal regions LUNGS: clear to auscultation and  No wheeze or crackles HEART/CVS: regular rate & rhythm and no murmurs; No lower extremity edema ABDOMEN:abdomen soft, non-tender and normal bowel sounds Musculoskeletal:no cyanosis of digits and no clubbing  PSYCH: alert & oriented x 3 with fluent speech NEURO: no focal motor/sensory deficits SKIN:  no rashes or significant lesions  LABORATORY DATA:  I have reviewed the data as listed    Component Value Date/Time   NA 139 03/16/2016 1022   NA 137 11/12/2014 1415   K 4.1 03/16/2016 1022   K 4.1 11/12/2014 1415   CL 103 03/16/2016 1022   CL 105 11/12/2014 1415   CO2 27 03/16/2016 1022   CO2 24 11/12/2014 1415   GLUCOSE 98 03/16/2016 1022   GLUCOSE 98 11/12/2014 1415   BUN 21 (H) 03/16/2016 1022   BUN 24 (H) 11/12/2014 1415   CREATININE 0.98 03/16/2016 1022   CREATININE 1.02 11/12/2014 1415   CALCIUM 9.2 03/16/2016 1022   CALCIUM 9.2 11/12/2014 1415   PROT 7.2 03/16/2016 1022   PROT 7.4 11/12/2014 1415   ALBUMIN 4.5 03/16/2016 1022   ALBUMIN 4.4 11/12/2014 1415   AST 15 03/16/2016 1022   AST 23 11/12/2014 1415   ALT 12 (L) 03/16/2016 1022   ALT 16 (L) 11/12/2014 1415    ALKPHOS 52 03/16/2016 1022   ALKPHOS 103 11/12/2014 1415   BILITOT 1.0 03/16/2016 1022   BILITOT 0.4 11/12/2014 1415   GFRNONAA >60 03/16/2016 1022   GFRNONAA >60 11/12/2014 1415   GFRAA >60 03/16/2016 1022   GFRAA >60 11/12/2014 1415    No results found for: SPEP, UPEP  Lab Results  Component Value Date   WBC 4.3 03/16/2016   NEUTROABS 2.0 03/16/2016   HGB 14.7 03/16/2016   HCT 41.7 03/16/2016   MCV 97.9 03/16/2016   PLT 212 03/16/2016      Chemistry      Component Value Date/Time   NA 139 03/16/2016 1022  NA 137 11/12/2014 1415   K 4.1 03/16/2016 1022   K 4.1 11/12/2014 1415   CL 103 03/16/2016 1022   CL 105 11/12/2014 1415   CO2 27 03/16/2016 1022   CO2 24 11/12/2014 1415   BUN 21 (H) 03/16/2016 1022   BUN 24 (H) 11/12/2014 1415   CREATININE 0.98 03/16/2016 1022   CREATININE 1.02 11/12/2014 1415      Component Value Date/Time   CALCIUM 9.2 03/16/2016 1022   CALCIUM 9.2 11/12/2014 1415   ALKPHOS 52 03/16/2016 1022   ALKPHOS 103 11/12/2014 1415   AST 15 03/16/2016 1022   AST 23 11/12/2014 1415   ALT 12 (L) 03/16/2016 1022   ALT 16 (L) 11/12/2014 1415   BILITOT 1.0 03/16/2016 1022   BILITOT 0.4 11/12/2014 1415       RADIOGRAPHIC STUDIES: I have personally reviewed the radiological images as listed and agreed with the findings in the report. No results found.   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 Also on Zytiga plus Xtandi- as per clinical trial. Most AUG 2017 PSA 15.9   # Patient tolerating treatment very well. Sep 2017- CT scans due again.  # Bone lesions continue Xgeva; no side effects noted. Continue calcium and vitamin D.  Follow up with me in 4 weeks/ labs/X-geva. Spoke to clinical trial RN. Sep 27th follow up.    No orders of the defined types were placed in this encounter.  All questions were  answered. The patient knows to call the clinic with any problems, questions or concerns.      Cammie Sickle, MD 03/16/2016 4:45 PM

## 2016-03-17 ENCOUNTER — Telehealth: Payer: Self-pay

## 2016-03-17 NOTE — Telephone Encounter (Signed)
Received T/C from patient requesting PSA results. PSA result was provided to patient (13.12 ng/mL). The patient stated he was very happy that it was continuing to improve.  Also reminded patient of his next appointment date and time (April 12, 2016 @9 :15am).  Mirian Mo, RN, BSN 03/17/2016 11:30 AM

## 2016-03-28 ENCOUNTER — Other Ambulatory Visit: Payer: Self-pay | Admitting: Internal Medicine

## 2016-04-05 ENCOUNTER — Ambulatory Visit
Admission: RE | Admit: 2016-04-05 | Discharge: 2016-04-05 | Disposition: A | Discharge status: Home / Self Care | Payer: Medicare Other | Source: Ambulatory Visit | Attending: Internal Medicine | Admitting: Internal Medicine

## 2016-04-05 DIAGNOSIS — C61 Malignant neoplasm of prostate: Secondary | ICD-10-CM

## 2016-04-05 DIAGNOSIS — R590 Localized enlarged lymph nodes: Secondary | ICD-10-CM | POA: Insufficient documentation

## 2016-04-05 DIAGNOSIS — C7951 Secondary malignant neoplasm of bone: Secondary | ICD-10-CM | POA: Insufficient documentation

## 2016-04-05 MED ORDER — TECHNETIUM TC 99M MEDRONATE IV KIT
23.2900 | PACK | Freq: Once | INTRAVENOUS | Status: AC | PRN
  Administered 2016-04-05: 23.29 via INTRAVENOUS

## 2016-04-05 MED ORDER — IOPAMIDOL (ISOVUE-300) INJECTION 61%
100.0000 mL | Freq: Once | INTRAVENOUS | Status: AC | PRN
  Administered 2016-04-05: 100 mL via INTRAVENOUS

## 2016-04-10 ENCOUNTER — Other Ambulatory Visit: Payer: Self-pay

## 2016-04-10 DIAGNOSIS — C61 Malignant neoplasm of prostate: Secondary | ICD-10-CM

## 2016-04-11 ENCOUNTER — Other Ambulatory Visit: Payer: Self-pay | Admitting: *Deleted

## 2016-04-11 DIAGNOSIS — C61 Malignant neoplasm of prostate: Secondary | ICD-10-CM

## 2016-04-12 ENCOUNTER — Inpatient Hospital Stay: Payer: Medicare Other

## 2016-04-12 ENCOUNTER — Encounter: Payer: Self-pay | Admitting: *Deleted

## 2016-04-12 ENCOUNTER — Inpatient Hospital Stay: Payer: Medicare Other | Attending: Internal Medicine | Admitting: Internal Medicine

## 2016-04-12 VITALS — BP 138/79 | HR 62 | Temp 96.4°F | Resp 20 | Ht 70.0 in | Wt 163.8 lb

## 2016-04-12 DIAGNOSIS — C61 Malignant neoplasm of prostate: Secondary | ICD-10-CM

## 2016-04-12 DIAGNOSIS — Z7952 Long term (current) use of systemic steroids: Secondary | ICD-10-CM | POA: Diagnosis not present

## 2016-04-12 DIAGNOSIS — Z006 Encounter for examination for normal comparison and control in clinical research program: Secondary | ICD-10-CM

## 2016-04-12 DIAGNOSIS — C7951 Secondary malignant neoplasm of bone: Secondary | ICD-10-CM | POA: Diagnosis not present

## 2016-04-12 DIAGNOSIS — Z79818 Long term (current) use of other agents affecting estrogen receptors and estrogen levels: Secondary | ICD-10-CM

## 2016-04-12 DIAGNOSIS — Z79891 Long term (current) use of opiate analgesic: Secondary | ICD-10-CM

## 2016-04-12 DIAGNOSIS — Z79899 Other long term (current) drug therapy: Secondary | ICD-10-CM | POA: Diagnosis not present

## 2016-04-12 LAB — COMPREHENSIVE METABOLIC PANEL
ALK PHOS: 47 U/L (ref 38–126)
ALT: 11 U/L — AB (ref 17–63)
AST: 16 U/L (ref 15–41)
Albumin: 4.7 g/dL (ref 3.5–5.0)
Anion gap: 7 (ref 5–15)
BILIRUBIN TOTAL: 1 mg/dL (ref 0.3–1.2)
BUN: 22 mg/dL — AB (ref 6–20)
CALCIUM: 9.6 mg/dL (ref 8.9–10.3)
CHLORIDE: 103 mmol/L (ref 101–111)
CO2: 28 mmol/L (ref 22–32)
CREATININE: 1.01 mg/dL (ref 0.61–1.24)
Glucose, Bld: 96 mg/dL (ref 65–99)
Potassium: 4.1 mmol/L (ref 3.5–5.1)
Sodium: 138 mmol/L (ref 135–145)
TOTAL PROTEIN: 7.7 g/dL (ref 6.5–8.1)

## 2016-04-12 LAB — CBC WITH DIFFERENTIAL/PLATELET
Basophils Absolute: 0 10*3/uL (ref 0–0.1)
Basophils Relative: 1 %
EOS ABS: 0.1 10*3/uL (ref 0–0.7)
Eosinophils Relative: 1 %
HCT: 42.7 % (ref 40.0–52.0)
HEMOGLOBIN: 14.8 g/dL (ref 13.0–18.0)
LYMPHS ABS: 1.5 10*3/uL (ref 1.0–3.6)
LYMPHS PCT: 36 %
MCH: 33.7 pg (ref 26.0–34.0)
MCHC: 34.6 g/dL (ref 32.0–36.0)
MCV: 97.4 fL (ref 80.0–100.0)
MONOS PCT: 11 %
Monocytes Absolute: 0.5 10*3/uL (ref 0.2–1.0)
NEUTROS PCT: 51 %
Neutro Abs: 2.1 10*3/uL (ref 1.4–6.5)
Platelets: 200 10*3/uL (ref 150–440)
RBC: 4.39 MIL/uL — ABNORMAL LOW (ref 4.40–5.90)
RDW: 13.7 % (ref 11.5–14.5)
WBC: 4.1 10*3/uL (ref 3.8–10.6)

## 2016-04-12 LAB — PSA: PSA: 16.38 ng/mL — AB (ref 0.00–4.00)

## 2016-04-12 MED ORDER — DENOSUMAB 120 MG/1.7ML ~~LOC~~ SOLN
120.0000 mg | Freq: Once | SUBCUTANEOUS | Status: AC
  Administered 2016-04-12: 120 mg via SUBCUTANEOUS
  Filled 2016-04-12: qty 1.7

## 2016-04-12 NOTE — Assessment & Plan Note (Addendum)
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 Also on Zytiga plus Xtandi- as per clinical trial. Most Mar 16 2016 PSA 13.  # Patient tolerating treatment very well. Sep 2017- CT scans- STABLE disease.  If patient has progressive disease/ clinical basis recommend chemotherapy with docetaxel. For now continue current therapy on the clinical trial. Follow-up scans in 12 weeks.  # Bone lesions continue Xgeva; no side effects noted. Continue calcium and vitamin D.  Follow up with me in 4 weeks/ labs/X-geva. Spoke to clinical trial RN.   I reviewed the images myself and with the patient in  detail.

## 2016-04-12 NOTE — Progress Notes (Signed)
[place] OFFICE PROGRESS NOTE  Patient Care Team: Provider Not In System as PCP - General Royston Cowper, MD (Urology)  Cancer of prostate [facility])   Staging form: Prostate, AJCC 7th Edition     Clinical: Stage IV (T2, M1) - Signed by Evlyn Kanner, NP on 01/07/2015    Oncology History   # FEB 2014- Prostate cancer Stage II [T2N0]; PA-18;   # May 2015- PSA 90 on ADT; CT/Bone scan-extensive mets;  casodex+ Trelstar  # June 2016- Castration resistant prostate cancer/[facility] protocol- ZYTIGA plus minus XTANDI; June 28th-CT-C/A/P- Stable Retrocrural/RP/Pelvic LN;Bone scan- Stable sclerotic lesions   # Xgeva     Cancer of prostate ([place])   08/17/2012 Initial Diagnosis    Cancer of prostate, stage II      12/03/2013 Progression         10/13/2014 Progression          Prostate cancer metastatic to multiple sites ([place])   01/19/2016 Initial Diagnosis    Prostate cancer metastatic to multiple sites [facility])        INTERVAL HISTORY:  William Wright 65 y.o.  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  Denies any bone pain. Patient's right arm pain is resolved. He has been using essentially oils topical.    Denies any jaw pain. Denies any significant hot flashes. No swelling in the legs. No shortness of breath. No chest pain. No seizures. Appetite is good. Denies any pain. No nausea or vomiting. No significant fatigue.   REVIEW OF SYSTEMS:  A complete 10 point review of system is done which is negative except mentioned above/history of present illness.   PAST MEDICAL HISTORY :  Past Medical History:  Diagnosis Date  . Cancer of prostate ([place]) 11/18/2014  . Prostate cancer ([place])     PAST SURGICAL HISTORY :   Past Surgical History:  Procedure Laterality Date  . EXTRACORPOREAL SHOCK WAVE LITHOTRIPSY Left  05/20/2015   Procedure: EXTRACORPOREAL SHOCK WAVE LITHOTRIPSY (ESWL);  Surgeon: Royston Cowper, MD;  Location: [facility] ORS;  Service: Urology;  Laterality: Left;    FAMILY HISTORY :  No family history on file.  SOCIAL HISTORY:   Social History  Substance Use Topics  . Smoking status: Never Smoker  . Smokeless tobacco: Not on file  . Alcohol use No    ALLERGIES:  is allergic to no known allergies.  MEDICATIONS:  Current Outpatient Prescriptions  Medication Sig Dispense Refill  . abiraterone Acetate (ZYTIGA) 250 MG tablet Take 4 tablets (1,000 mg total) by mouth daily. Take on an empty stomach 1 hour before or 2 hours after a meal 120 tablet 4  . Investigational enzalutamide 40 MG capsule [facility] [id number] Take 4 capsules (160 mg total) by mouth daily. Take with or without food.Swallow whole. Do not crush, or open capsules. 120 capsule 0  . Investigational enzalutamide 40 MG capsule [facility] [id number] Take 4 capsules (160 mg total) by mouth daily. Take with or without food.Swallow whole. Do not crush, or open capsules. 120 capsule 0  . predniSONE (DELTASONE) 5 MG tablet Take 1 tablet (5 mg total) by mouth 2 (two) times daily. 60 tablet 0  . tamsulosin (FLOMAX) 0.4 MG CAPS capsule Take 1 capsule (0.4 mg total) by mouth daily. 30 capsule 11  . Triptorelin Pamoate (TRELSTAR) 22.5 MG injection Inject 22.5 mg into the  muscle every 6 (six) months.    . fluticasone (FLONASE) 50 MCG/ACT nasal spray Place 1 spray into both nostrils daily. (Patient not taking: Reported on 04/12/2016) 16 g 2  . ondansetron (ZOFRAN ODT) 8 MG disintegrating tablet Take 1 tablet (8 mg total) by mouth every 6 (six) hours as needed for nausea or vomiting. (Patient not taking: Reported on 04/12/2016) 10 tablet 3  . OxyCODONE HCl, Abuse Deter, (OXAYDO) 5 MG TABA Take 1 tablet by mouth every 4 (four) hours as needed. (Patient not taking: Reported on 04/12/2016) 30 tablet 0   No current facility-administered medications for this  visit.     PHYSICAL EXAMINATION: ECOG PERFORMANCE STATUS: 0 - Asymptomatic  BP 138/79 (Patient Position: Sitting)   Pulse 62   Temp (!) 96.4 F (35.8 C) (Tympanic)   Resp 20   Ht 5\' 10"  (1.778 m)   Wt 163 lb 12.8 oz (74.3 kg)   BMI 23.50 kg/m   Filed Weights   04/12/16 0947  Weight: 163 lb 12.8 oz (74.3 kg)    GENERAL: Well-nourished well-developed; Alert, no distress and comfortable. He is alone.  EYES: no pallor or icterus OROPHARYNX: no thrush or ulceration; good dentition  NECK: supple, no masses felt LYMPH:  no palpable lymphadenopathy in the cervical, axillary or inguinal regions LUNGS: clear to auscultation and  No wheeze or crackles HEART/CVS: regular rate & rhythm and no murmurs; No lower extremity edema ABDOMEN:abdomen soft, non-tender and normal bowel sounds Musculoskeletal:no cyanosis of digits and no clubbing  PSYCH: alert & oriented x 3 with fluent speech NEURO: no focal motor/sensory deficits SKIN:  no rashes or significant lesions  LABORATORY DATA:  I have reviewed the data as listed    Component Value Date/Time   NA 138 04/12/2016 0920   NA 137 11/12/2014 1415   K 4.1 04/12/2016 0920   K 4.1 11/12/2014 1415   CL 103 04/12/2016 0920   CL 105 11/12/2014 1415   CO2 28 04/12/2016 0920   CO2 24 11/12/2014 1415   GLUCOSE 96 04/12/2016 0920   GLUCOSE 98 11/12/2014 1415   BUN 22 (H) 04/12/2016 0920   BUN 24 (H) 11/12/2014 1415   CREATININE 1.01 04/12/2016 0920   CREATININE 1.02 11/12/2014 1415   CALCIUM 9.6 04/12/2016 0920   CALCIUM 9.2 11/12/2014 1415   PROT 7.7 04/12/2016 0920   PROT 7.4 11/12/2014 1415   ALBUMIN 4.7 04/12/2016 0920   ALBUMIN 4.4 11/12/2014 1415   AST 16 04/12/2016 0920   AST 23 11/12/2014 1415   ALT 11 (L) 04/12/2016 0920   ALT 16 (L) 11/12/2014 1415   ALKPHOS 47 04/12/2016 0920   ALKPHOS 103 11/12/2014 1415   BILITOT 1.0 04/12/2016 0920   BILITOT 0.4 11/12/2014 1415   GFRNONAA >60 04/12/2016 0920   GFRNONAA >60  11/12/2014 1415   GFRAA >60 04/12/2016 0920   GFRAA >60 11/12/2014 1415    No results found for: SPEP, UPEP  Lab Results  Component Value Date   WBC 4.1 04/12/2016   NEUTROABS 2.1 04/12/2016   HGB 14.8 04/12/2016   HCT 42.7 04/12/2016   MCV 97.4 04/12/2016   PLT 200 04/12/2016      Chemistry      Component Value Date/Time   NA 138 04/12/2016 0920   NA 137 11/12/2014 1415   K 4.1 04/12/2016 0920   K 4.1 11/12/2014 1415   CL 103 04/12/2016 0920   CL 105 11/12/2014 1415   CO2 28 04/12/2016 0920  CO2 24 11/12/2014 1415   BUN 22 (H) 04/12/2016 0920   BUN 24 (H) 11/12/2014 1415   CREATININE 1.01 04/12/2016 0920   CREATININE 1.02 11/12/2014 1415      Component Value Date/Time   CALCIUM 9.6 04/12/2016 0920   CALCIUM 9.2 11/12/2014 1415   ALKPHOS 47 04/12/2016 0920   ALKPHOS 103 11/12/2014 1415   AST 16 04/12/2016 0920   AST 23 11/12/2014 1415   ALT 11 (L) 04/12/2016 0920   ALT 16 (L) 11/12/2014 1415   BILITOT 1.0 04/12/2016 0920   BILITOT 0.4 11/12/2014 1415       RADIOGRAPHIC STUDIES: I have personally reviewed the radiological images as listed and agreed with the findings in the report. No results found.   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 Also on Zytiga plus Xtandi- as per clinical trial. Most Mar 16 2016 PSA 13.  # Patient tolerating treatment very well. Sep 2017- CT scans- STABLE disease.  If patient has progressive disease/ clinical basis recommend chemotherapy with docetaxel. For now continue current therapy on the clinical trial. Follow-up scans in 12 weeks.  # Bone lesions continue Xgeva; no side effects noted. Continue calcium and vitamin D.  Follow up with me in 4 weeks/ labs/X-geva. Spoke to clinical trial RN.   I reviewed the images myself and with the patient in  detail.   No orders of the defined types  were placed in this encounter.  All questions were answered. The patient knows to call the clinic with any problems, questions or concerns.      Cammie Sickle, MD 04/12/2016 2:27 PM

## 2016-04-12 NOTE — Progress Notes (Signed)
William Wright returns to clinic today for consideration of his cycle 19 treatment on the [facility] [phone] Prostate study receiving daily Enzalutamide, Abiraterone, and Prednisone. VS remain stable. CBC and chemistries are also wnl. PSA has not yet resulted. Patient reports his Right upper arm pain has now resolved with use of essential oil cream. He continues to report fatigue and hot flashes that are "about the same as before." He denies any problems tolerating his medication and returned his completed medication diaries, PK worksheet and Enzalutamide bottle with 8 capsules remaining. He reports taking all of his study drugs as prescribed and this is verified by the medication diaries for this cycle. William Wright had a CT and Bone scan last week and Dr. [name] has reviewed these results with him. The bone scan was stable and showed no new lesions. The CT scan showed a 20% increase in the target lesions since the last scan as well as a slight increase in the size of 3 retroperitoneal nodes that we have not been following as target or non-target lesions. Dr. [name] has reviewed the scans and feels this is stable disease. Per protocol section 14.2 "Patients should continue protocol treatment until confirmed radiographic or unequivocal clinical progression. If the patient has radiographic progression but no unequivocal clinical progression and alternate treatment is not initiated, the patient may continue on study treatment, at the investigator's discretion." The patient has no clinical signs of progression at this time and  Dr. [name] has decided that he will continue the patient on study at this time and re-evaluate in 12 weeks. William Wright is in agreement with this plan. States he did see another dentist this week and had his teeth cleaned and x-rays done. States the xrays were normal. Stressed importance of dental exams due to potential for osteonecrosis of the jaw with Xgeva. He states his dentist  recommended a tooth extraction, but informed William Wright that he should not have this procedure while he is taking chemotherapy. Patient states Dr. [name] told him "no extractions." Pharmacy has dispensed a  new bottle of 120 capsules of study drug Enzalutamide to the patient and he was given new medication diaries for the next 4 weeks. He will receive the Xgeva injection today and return appointment was scheduled for 05/10/16.  Adverse events were assessed as follows:    Adverse Event Log  Study/Protocol: [facility] [id number] Cycle: 18 adverse events  Event Grade Onset Date Resolved Date Drug Name Enzalutamide + Abiraterone Attribution Treatment Comments  Fatigue Grade 1 11/25/14   Probably    Hot Flashes Grade 1 11/25/14   Unrelated  r/t HST  Weight loss Grade 1 06/09/15 03/16/16  Possible  stable  Arm pain (bone) Grade 1 unk unknown  Possible  Reports now resolved  Mild Headaches Grade 1 unknown unknown  Unrelated  Denies further h/a   [name], BSN, MHA, OCN 04/12/2016  11:00 AM

## 2016-04-13 ENCOUNTER — Encounter: Payer: Self-pay | Admitting: Internal Medicine

## 2016-05-10 ENCOUNTER — Other Ambulatory Visit: Payer: Self-pay

## 2016-05-10 ENCOUNTER — Encounter: Payer: Self-pay | Admitting: *Deleted

## 2016-05-10 ENCOUNTER — Inpatient Hospital Stay (HOSPITAL_BASED_OUTPATIENT_CLINIC_OR_DEPARTMENT_OTHER): Payer: Medicare Other | Admitting: Internal Medicine

## 2016-05-10 ENCOUNTER — Inpatient Hospital Stay: Payer: Medicare Other | Attending: Internal Medicine

## 2016-05-10 ENCOUNTER — Inpatient Hospital Stay: Payer: Medicare Other

## 2016-05-10 ENCOUNTER — Encounter: Payer: Self-pay | Admitting: Internal Medicine

## 2016-05-10 VITALS — BP 130/77 | HR 69 | Temp 96.1°F | Resp 18 | Wt 167.1 lb

## 2016-05-10 DIAGNOSIS — Z006 Encounter for examination for normal comparison and control in clinical research program: Secondary | ICD-10-CM | POA: Diagnosis not present

## 2016-05-10 DIAGNOSIS — Z7952 Long term (current) use of systemic steroids: Secondary | ICD-10-CM | POA: Diagnosis not present

## 2016-05-10 DIAGNOSIS — C7951 Secondary malignant neoplasm of bone: Secondary | ICD-10-CM | POA: Diagnosis not present

## 2016-05-10 DIAGNOSIS — C61 Malignant neoplasm of prostate: Secondary | ICD-10-CM

## 2016-05-10 DIAGNOSIS — Z79899 Other long term (current) drug therapy: Secondary | ICD-10-CM

## 2016-05-10 DIAGNOSIS — Z79818 Long term (current) use of other agents affecting estrogen receptors and estrogen levels: Secondary | ICD-10-CM | POA: Diagnosis not present

## 2016-05-10 DIAGNOSIS — R232 Flushing: Secondary | ICD-10-CM | POA: Diagnosis not present

## 2016-05-10 DIAGNOSIS — C775 Secondary and unspecified malignant neoplasm of intrapelvic lymph nodes: Secondary | ICD-10-CM | POA: Diagnosis not present

## 2016-05-10 DIAGNOSIS — R5383 Other fatigue: Secondary | ICD-10-CM | POA: Insufficient documentation

## 2016-05-10 DIAGNOSIS — Z7951 Long term (current) use of inhaled steroids: Secondary | ICD-10-CM

## 2016-05-10 LAB — COMPREHENSIVE METABOLIC PANEL
ALBUMIN: 4.5 g/dL (ref 3.5–5.0)
ALK PHOS: 53 U/L (ref 38–126)
ALT: 12 U/L — AB (ref 17–63)
AST: 16 U/L (ref 15–41)
Anion gap: 9 (ref 5–15)
BILIRUBIN TOTAL: 0.8 mg/dL (ref 0.3–1.2)
BUN: 20 mg/dL (ref 6–20)
CO2: 28 mmol/L (ref 22–32)
CREATININE: 0.92 mg/dL (ref 0.61–1.24)
Calcium: 9.4 mg/dL (ref 8.9–10.3)
Chloride: 102 mmol/L (ref 101–111)
GFR calc Af Amer: >60 mL/min (ref >60)
GFR calc non Af Amer: >60 mL/min (ref >60)
GLUCOSE: 85 mg/dL (ref 65–99)
POTASSIUM: 3.9 mmol/L (ref 3.5–5.1)
Sodium: 139 mmol/L (ref 135–145)
TOTAL PROTEIN: 7.1 g/dL (ref 6.5–8.1)

## 2016-05-10 LAB — CBC WITH DIFFERENTIAL/PLATELET
Basophils Absolute: 0 10*3/uL (ref 0–0.1)
Basophils Relative: 1 %
EOS ABS: 0.1 10*3/uL (ref 0–0.7)
EOS PCT: 2 %
HCT: 41.2 % (ref 40.0–52.0)
Hemoglobin: 14.5 g/dL (ref 13.0–18.0)
LYMPHS ABS: 1.5 10*3/uL (ref 1.0–3.6)
Lymphocytes Relative: 33 %
MCH: 33.9 pg (ref 26.0–34.0)
MCHC: 35.1 g/dL (ref 32.0–36.0)
MCV: 96.6 fL (ref 80.0–100.0)
MONOS PCT: 11 %
Monocytes Absolute: 0.5 10*3/uL (ref 0.2–1.0)
Neutro Abs: 2.5 10*3/uL (ref 1.4–6.5)
Neutrophils Relative %: 53 %
Platelets: 188 10*3/uL (ref 150–440)
RBC: 4.27 MIL/uL — ABNORMAL LOW (ref 4.40–5.90)
RDW: 13.4 % (ref 11.5–14.5)
WBC: 4.6 10*3/uL (ref 3.8–10.6)

## 2016-05-10 LAB — PSA: PSA: 17.38 ng/mL — ABNORMAL HIGH (ref 0.00–4.00)

## 2016-05-10 MED ORDER — DENOSUMAB 120 MG/1.7ML ~~LOC~~ SOLN
120.0000 mg | Freq: Once | SUBCUTANEOUS | Status: AC
Start: 2016-05-10 — End: 2016-05-10
  Administered 2016-05-10: 120 mg via SUBCUTANEOUS
  Filled 2016-05-10: qty 1.7

## 2016-05-10 NOTE — Progress Notes (Signed)
Patient does not offer any problems today.  

## 2016-05-10 NOTE — Progress Notes (Signed)
William Wright returns to clinic today for consideration of his cycle 20 treatment on the [facility] [phone] Prostate study receiving daily Enzalutamide, Abiraterone, and Prednisone. VS remain stable. CBC and chemistries also continue to be within normal ranges. PSA has not yet resulted. Patient denies any further right upper arm pain with continued use of essential oil cream. He continues to report fatigue and hot flashes that are unchanged. His ECOG PS remains at 0. He denies any problems tolerating his medication and again returned his completed medication diaries, PK worksheet and Enzalutamide bottle with 8 capsules remaining. He reports taking all of his study drugs as prescribed and this is verified by the medication diaries for this cycle. Dr. Sherrine Maples was in to examine patient this morning for H&P and will also prescribe patient's Xgeva. Pharmacy has dispensed a  new bottle of 120 capsules of study drug Enzalutamide to the patient and he was given new medication diaries for cycle 20 (the next 4 weeks). He will receive the Xgeva injection today and return appointment was scheduled for 06/07/16.  Adverse events were assessed as follows:    Adverse Event Log  Study/Protocol: [facility] [id number] Cycle: 19 adverse events  Event Grade Onset Date Resolved Date Drug Name Enzalutamide + Abiraterone Attribution Treatment Comments  Fatigue Grade 1 11/25/14   Probably    Hot Flashes Grade 1 11/25/14   Unrelated  r/t HST     [name], BSN, MHA, OCN 05/10/2016 9:52 AM

## 2016-05-12 MED ORDER — INV-ENZALUTAMIDE 40 MG CAPS #120 ALLIANCE A031201: 160.0000 mg | ORAL_CAPSULE | Freq: Every day | ORAL | 0 refills | Status: DC

## 2016-05-17 NOTE — Progress Notes (Signed)
Chief complaint:  William Wright is being seen today for follow-up for metastatic prostate cancer with multiple bone metastasis.  History of present illness: He is being treated on the [facility] [phone] prostates study receiving daily Enzalutamide, Abiraterone, prednisone. His last visit with Dr. be on 04/11/2016 included a review of his CT scans of the chest abdomen pelvis and bone scan from the same day which showed minimal on and equal vocal evidence of progression of disease. There were no new lesions noted it was felt this was stable disease therefore he will continue treatment as per protocol.  He has been taking prednisone as prescribed.  Today he reports feeling tired and intermittent mild hot flashes that do not interfere with his ADLs. He denies any cough no new pain no symptoms of peripheral neuropathy. All other systems review is negative.  Results review: His CBC today shows hemoglobin 14.5 WBC count is 4.6 platelets are 188 His CMP is unremarkable serum creatinine 1 and calcium 9.6 LFTs are stable and normal. His PSA today 17.38 compared to 16.38 a month ago.  Physical exam: His vital signs are stable with blood pressure 08/16/1976 pulse 69 and regular respirations 18 temperature 96.1 and weight is stable at 167 pounds.  He is alert awake oriented 3, in no apparent distress, no pallor or icterus or skin rashes. Note tachypnea no use of accessory muscles of respiration. Neck supple no lymph nodes in the neck supraclavicular areas. No extremity edema was noted.  Impression and plan: Metastatic prostate cancer with stable disease. Continue treatment as per protocol-  he is meeting with the research nurse Peggye Fothergill, RN today to review his medication schedule. He also takes injection Trelstar 22.5 mg intramuscular every 6 months. Next due in Jan 2018 He also receives XGEVA by injection every 4 weeks. He is due for one injection today which has been ordered. He will  continue calcium and vitamin D supplementation. RTC in 4 weeks.

## 2016-06-07 ENCOUNTER — Inpatient Hospital Stay: Payer: Medicare Other

## 2016-06-07 ENCOUNTER — Inpatient Hospital Stay (HOSPITAL_BASED_OUTPATIENT_CLINIC_OR_DEPARTMENT_OTHER): Payer: Medicare Other | Admitting: Internal Medicine

## 2016-06-07 ENCOUNTER — Inpatient Hospital Stay: Payer: Medicare Other | Attending: Internal Medicine

## 2016-06-07 VITALS — BP 113/78 | HR 85 | Temp 97.1°F | Resp 18 | Wt 165.0 lb

## 2016-06-07 DIAGNOSIS — Z79899 Other long term (current) drug therapy: Secondary | ICD-10-CM

## 2016-06-07 DIAGNOSIS — C61 Malignant neoplasm of prostate: Secondary | ICD-10-CM

## 2016-06-07 DIAGNOSIS — Z006 Encounter for examination for normal comparison and control in clinical research program: Secondary | ICD-10-CM | POA: Insufficient documentation

## 2016-06-07 DIAGNOSIS — Z7952 Long term (current) use of systemic steroids: Secondary | ICD-10-CM | POA: Insufficient documentation

## 2016-06-07 DIAGNOSIS — C7951 Secondary malignant neoplasm of bone: Secondary | ICD-10-CM | POA: Diagnosis not present

## 2016-06-07 DIAGNOSIS — Z79818 Long term (current) use of other agents affecting estrogen receptors and estrogen levels: Secondary | ICD-10-CM

## 2016-06-07 DIAGNOSIS — C775 Secondary and unspecified malignant neoplasm of intrapelvic lymph nodes: Secondary | ICD-10-CM | POA: Insufficient documentation

## 2016-06-07 LAB — COMPREHENSIVE METABOLIC PANEL
ALK PHOS: 56 U/L (ref 38–126)
ALT: 11 U/L — AB (ref 17–63)
AST: 16 U/L (ref 15–41)
Albumin: 4.1 g/dL (ref 3.5–5.0)
Anion gap: 9 (ref 5–15)
BUN: 21 mg/dL — AB (ref 6–20)
CHLORIDE: 105 mmol/L (ref 101–111)
CO2: 26 mmol/L (ref 22–32)
CREATININE: 0.91 mg/dL (ref 0.61–1.24)
Calcium: 8.8 mg/dL — ABNORMAL LOW (ref 8.9–10.3)
GFR calc Af Amer: >60 mL/min (ref >60)
Glucose, Bld: 95 mg/dL (ref 65–99)
Potassium: 4.1 mmol/L (ref 3.5–5.1)
Sodium: 140 mmol/L (ref 135–145)
Total Bilirubin: 0.9 mg/dL (ref 0.3–1.2)
Total Protein: 7 g/dL (ref 6.5–8.1)

## 2016-06-07 LAB — CBC WITH DIFFERENTIAL/PLATELET
BASOS ABS: 0 10*3/uL (ref 0–0.1)
Basophils Relative: 1 %
Eosinophils Absolute: 0.1 10*3/uL (ref 0–0.7)
Eosinophils Relative: 2 %
HEMATOCRIT: 40.9 % (ref 40.0–52.0)
HEMOGLOBIN: 14 g/dL (ref 13.0–18.0)
LYMPHS PCT: 26 %
Lymphs Abs: 1.1 10*3/uL (ref 1.0–3.6)
MCH: 33.5 pg (ref 26.0–34.0)
MCHC: 34.3 g/dL (ref 32.0–36.0)
MCV: 97.7 fL (ref 80.0–100.0)
Monocytes Absolute: 0.6 10*3/uL (ref 0.2–1.0)
Monocytes Relative: 14 %
NEUTROS ABS: 2.5 10*3/uL (ref 1.4–6.5)
NEUTROS PCT: 57 %
PLATELETS: 176 10*3/uL (ref 150–440)
RBC: 4.18 MIL/uL — AB (ref 4.40–5.90)
RDW: 13.2 % (ref 11.5–14.5)
WBC: 4.3 10*3/uL (ref 3.8–10.6)

## 2016-06-07 LAB — PSA: PSA: 13.84 ng/mL — AB (ref 0.00–4.00)

## 2016-06-07 MED ORDER — DENOSUMAB 120 MG/1.7ML ~~LOC~~ SOLN
120.0000 mg | Freq: Once | SUBCUTANEOUS | Status: AC
  Administered 2016-06-07: 120 mg via SUBCUTANEOUS
  Filled 2016-06-07: qty 1.7

## 2016-06-07 NOTE — Progress Notes (Signed)
The patient returns to clinic today for consideration of cycle 21 treatment on the [facility] [id number] Prostate study receiving daily Enzalutamide, Abiraterone, and Prednisone.  VS remain stable. The patient reports having a "stomach thing" last week on Monday and Tuesday. He reports having a headache and extreme fatigue, no n&v, no additional symptoms. The patient states, "It was probably a bug".  CBC and chemistries were all reviewed and discussed with the patient by Dr. [name]. All labs were within acceptable parameters. Dr. [name] also discussed recent PSA results all within acceptable range. PSA for today has not resulted. The patient denies any new symptoms and reports no arm pain since he has been using the essential oil cream. He continues to report some fatigue and hot flashes that are unchanged. He reports tolerating all medication and completed medication diaries, PK worksheet and returned Enzalutamide bottle with 8 capsules remaining. He reports taking all study drugs as prescribed and this was verified with medication diaries returned. He questioned the time at which he has been reporting his meals. He has been reporting the dinner meal from the night before and questioned if he should be reporting the breakfast meal instead. He states that he eats breakfast at 8:45am and then takes his medication at 9:00am. The patient was instructed to report the breakfast meal as his last meal prior to taking study medication. The patient verbalized understanding. Dr. [name] examined the patient. The patient asked Dr. [name] about two raised areas on the palm of his hand and Dr. [name] confirmed that they are calluses and no treatment needed. Pharmacy has dispensed a new bottle of 120 capsules of study drug Enzalutamide to the patient and he was given new medication diaries and PK questionnaire for cycle 21 (next 4 weeks). He will receive the Xgeva injection today and return appointment was scheduled  for 07/05/2016. The patient was informed of appointments and a copy was mailed to his home.  He also received appointments for CT with contrast of chest, abdomen and pelvis and a bone scan for June 30, 2016. Adverse events and attributions were assessed as follows:  Mirian Mo, RN, BSN 06/07/2016 12:02 PM      Adverse Event Log  Study/Protocol: [facility] [id number] Cycle: 20 adverse events  Event Grade Onset Date Resolved Date Drug Name Enzalutamide + Abiraterone Attribution Treatment Comments  Fatigue Grade 1 11/25/14   Probably    Hot Flashes Grade 1 11/25/14   Unrelated  r/t HST  Headache Grade 1 05/29/2016 05/30/2016  unrelated    Dyspepsia Grade 1 05/29/2016 05/30/2016  unrelated    Mirian Mo, RN, BSN 06/07/2016 12:02 PM

## 2016-06-07 NOTE — Progress Notes (Signed)
[place] OFFICE PROGRESS NOTE  Patient Care Team: Provider Not In System as PCP - General Royston Cowper, MD (Urology)  Cancer of prostate [facility])   Staging form: Prostate, AJCC 7th Edition     Clinical: Stage IV (T2, M1) - Signed by Evlyn Kanner, NP on 01/07/2015    Oncology History   # FEB 2014- Prostate cancer Stage II [T2N0]; PA-18;   # May 2015- PSA 90 on ADT; CT/Bone scan-extensive mets;  casodex+ Trelstar  # June 2016- Castration resistant prostate cancer/[facility] protocol- ZYTIGA plus minus XTANDI; June 28th-CT-C/A/P- Stable Retrocrural/RP/Pelvic LN;Bone scan- Stable sclerotic lesions   # Xgeva     Cancer of prostate ([place])   08/17/2012 Initial Diagnosis    Cancer of prostate, stage II      12/03/2013 Progression         10/13/2014 Progression          Prostate cancer metastatic to multiple sites ([place])   01/19/2016 Initial Diagnosis    Prostate cancer metastatic to multiple sites [facility])        INTERVAL HISTORY:  KEYONTAE BALUYOT 65 y.o.  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   Denies any unusual shortness of breath or cough. No chest pain or shortness of breath. No seizures. No weight loss. No bone pain.  Denies any jaw pain. Denies any significant hot flashes. No swelling in the legs. No nausea or vomiting. No significant fatigue.   REVIEW OF SYSTEMS:  A complete 10 point review of system is done which is negative except mentioned above/history of present illness.   PAST MEDICAL HISTORY :  Past Medical History:  Diagnosis Date  . Cancer of prostate ([place]) 11/18/2014  . Prostate cancer ([place])     PAST SURGICAL HISTORY :   Past Surgical History:  Procedure Laterality Date  . EXTRACORPOREAL SHOCK WAVE LITHOTRIPSY Left 05/20/2015   Procedure: EXTRACORPOREAL SHOCK WAVE LITHOTRIPSY (ESWL);  Surgeon: Royston Cowper, MD;   Location: [facility] ORS;  Service: Urology;  Laterality: Left;    FAMILY HISTORY :  No family history on file.  SOCIAL HISTORY:   Social History  Substance Use Topics  . Smoking status: Never Smoker  . Smokeless tobacco: Not on file  . Alcohol use No    ALLERGIES:  is allergic to no known allergies.  MEDICATIONS:  Current Outpatient Prescriptions  Medication Sig Dispense Refill  . abiraterone Acetate (ZYTIGA) 250 MG tablet Take 4 tablets (1,000 mg total) by mouth daily. Take on an empty stomach 1 hour before or 2 hours after a meal 120 tablet 4  . fluticasone (FLONASE) 50 MCG/ACT nasal spray Place 1 spray into both nostrils daily. 16 g 2  . Investigational enzalutamide 40 MG capsule [facility]ANCE [id number] Take 4 capsules (160 mg total) by mouth daily. Take with or without food.Swallow whole. Do not crush, or open capsules. 120 capsule 0  . Investigational enzalutamide 40 MG capsule [facility] [id number] Take 4 capsules (160 mg total) by mouth daily. Take with or without food.Swallow whole. Do not crush, or open capsules. 120 capsule 0  . Investigational enzalutamide 40 MG capsule [facility] [id number] Take 4 capsules (160 mg total) by mouth daily. Take with or without food.Swallow whole. Do not crush, or open capsules. 120 capsule 0  . predniSONE (DELTASONE) 5 MG tablet Take 1 tablet (5 mg total) by mouth 2 (two) times daily. 60 tablet  0  . tamsulosin (FLOMAX) 0.4 MG CAPS capsule Take 1 capsule (0.4 mg total) by mouth daily. 30 capsule 11  . Triptorelin Pamoate (TRELSTAR) 22.5 MG injection Inject 22.5 mg into the muscle every 6 (six) months.    . ondansetron (ZOFRAN ODT) 8 MG disintegrating tablet Take 1 tablet (8 mg total) by mouth every 6 (six) hours as needed for nausea or vomiting. (Patient not taking: Reported on 06/07/2016) 10 tablet 3  . OxyCODONE HCl, Abuse Deter, (OXAYDO) 5 MG TABA Take 1 tablet by mouth every 4 (four) hours as needed. (Patient not taking: Reported on 06/07/2016) 30 tablet 0   No  current facility-administered medications for this visit.     PHYSICAL EXAMINATION: ECOG PERFORMANCE STATUS: 0 - Asymptomatic  BP 113/78 (BP Location: Left Arm, Patient Position: Sitting)   Pulse 85   Temp 97.1 F (36.2 C) (Tympanic)   Resp 18   Wt 165 lb (74.8 kg)   BMI 23.68 kg/m   Filed Weights   06/07/16 1021  Weight: 165 lb (74.8 kg)    GENERAL: Well-nourished well-developed; Alert, no distress and comfortable. He is Accompanied by his grandchildren. EYES: no pallor or icterus OROPHARYNX: no thrush or ulceration; good dentition  NECK: supple, no masses felt LYMPH:  no palpable lymphadenopathy in the cervical, axillary or inguinal regions LUNGS: clear to auscultation and  No wheeze or crackles HEART/CVS: regular rate & rhythm and no murmurs; No lower extremity edema ABDOMEN:abdomen soft, non-tender and normal bowel sounds Musculoskeletal:no cyanosis of digits and no clubbing  PSYCH: alert & oriented x 3 with fluent speech NEURO: no focal motor/sensory deficits SKIN:  no rashes or significant lesions  LABORATORY DATA:  I have reviewed the data as listed    Component Value Date/Time   NA 140 06/07/2016 0951   NA 137 11/12/2014 1415   K 4.1 06/07/2016 0951   K 4.1 11/12/2014 1415   CL 105 06/07/2016 0951   CL 105 11/12/2014 1415   CO2 26 06/07/2016 0951   CO2 24 11/12/2014 1415   GLUCOSE 95 06/07/2016 0951   GLUCOSE 98 11/12/2014 1415   BUN 21 (H) 06/07/2016 0951   BUN 24 (H) 11/12/2014 1415   CREATININE 0.91 06/07/2016 0951   CREATININE 1.02 11/12/2014 1415   CALCIUM 8.8 (L) 06/07/2016 0951   CALCIUM 9.2 11/12/2014 1415   PROT 7.0 06/07/2016 0951   PROT 7.4 11/12/2014 1415   ALBUMIN 4.1 06/07/2016 0951   ALBUMIN 4.4 11/12/2014 1415   AST 16 06/07/2016 0951   AST 23 11/12/2014 1415   ALT 11 (L) 06/07/2016 0951   ALT 16 (L) 11/12/2014 1415   ALKPHOS 56 06/07/2016 0951   ALKPHOS 103 11/12/2014 1415   BILITOT 0.9 06/07/2016 0951   BILITOT 0.4 11/12/2014  1415   GFRNONAA >60 06/07/2016 0951   GFRNONAA >60 11/12/2014 1415   GFRAA >60 06/07/2016 0951   GFRAA >60 11/12/2014 1415    No results found for: SPEP, UPEP  Lab Results  Component Value Date   WBC 4.3 06/07/2016   NEUTROABS 2.5 06/07/2016   HGB 14.0 06/07/2016   HCT 40.9 06/07/2016   MCV 97.7 06/07/2016   PLT 176 06/07/2016      Chemistry      Component Value Date/Time   NA 140 06/07/2016 0951   NA 137 11/12/2014 1415   K 4.1 06/07/2016 0951   K 4.1 11/12/2014 1415   CL 105 06/07/2016 0951   CL 105 11/12/2014 1415   CO2  26 06/07/2016 0951   CO2 24 11/12/2014 1415   BUN 21 (H) 06/07/2016 0951   BUN 24 (H) 11/12/2014 1415   CREATININE 0.91 06/07/2016 0951   CREATININE 1.02 11/12/2014 1415      Component Value Date/Time   CALCIUM 8.8 (L) 06/07/2016 0951   CALCIUM 9.2 11/12/2014 1415   ALKPHOS 56 06/07/2016 0951   ALKPHOS 103 11/12/2014 1415   AST 16 06/07/2016 0951   AST 23 11/12/2014 1415   ALT 11 (L) 06/07/2016 0951   ALT 16 (L) 11/12/2014 1415   BILITOT 0.9 06/07/2016 0951   BILITOT 0.4 11/12/2014 1415     Results for DEEPAK, MOOK (MRN [id number]) as of 06/07/2016 18:12  Ref. Range 02/16/2016 10:33 03/16/2016 10:22 04/12/2016 09:20 05/10/2016 08:46 06/07/2016 09:51  PSA Latest Ref Range: 0.00 - 4.00 ng/mL 14.96 (H) 13.12 (H) 16.38 (H) 17.38 (H) 13.84 (H)    RADIOGRAPHIC STUDIES: I have personally reviewed the radiological images as listed and agreed with the findings in the report. No results found.   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 Also on Zytiga plus Xtandi- as per clinical trial.OCT 2017 PSA 17.  # Patient tolerating treatment very well. Sep 2017- CT scans- STABLE disease.  Scans in 4 weeks/dec 2017  # Bone lesions continue Xgeva; no side effects noted. Continue calcium and vitamin D.  Follow up  with me in 4 weeks/ labs/X-geva/ scans prior.  Spoke to clinical trial RN.    No orders of the defined types were placed in this encounter.  All questions were answered. The patient knows to call the clinic with any problems, questions or concerns.      Cammie Sickle, MD 06/07/2016 6:12 PM

## 2016-06-07 NOTE — Progress Notes (Signed)
Patient is here for follow up, he is doing well 

## 2016-06-07 NOTE — Assessment & Plan Note (Addendum)
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 Also on Zytiga plus Xtandi- as per clinical trial.OCT 2017 PSA 17.  # Patient tolerating treatment very well. Sep 2017- CT scans- STABLE disease.  Scans in 4 weeks/dec 2017  # Bone lesions continue Xgeva; no side effects noted. Continue calcium and vitamin D.  Follow up with me in 4 weeks/ labs/X-geva/ scans prior.  Spoke to clinical trial RN.

## 2016-06-12 ENCOUNTER — Encounter: Payer: Self-pay | Admitting: Internal Medicine

## 2016-06-12 ENCOUNTER — Telehealth: Payer: Self-pay | Admitting: *Deleted

## 2016-06-12 NOTE — Telephone Encounter (Addendum)
Received t/c from William Wright requesting the results of his PSA. PSA results given: 13.84. Patient commented that it was up to 17.38 last month. Reviewed patient's PSA levels over the past several months. He states he usually sees the results on My Chart, but his labs have not yet been posted from last week. States he hopes his next CT results will improve as well. Verified CT appt on 06/30/16 at [place], BSN, MHA, OCN 06/12/2016  11:02 AM

## 2016-06-13 MED ORDER — INV-ENZALUTAMIDE 40 MG CAPS #120 ALLIANCE A031201: 160.0000 mg | ORAL_CAPSULE | Freq: Every day | ORAL | 0 refills | Status: DC

## 2016-06-16 ENCOUNTER — Other Ambulatory Visit: Payer: Self-pay | Admitting: Internal Medicine

## 2016-06-16 DIAGNOSIS — C61 Malignant neoplasm of prostate: Secondary | ICD-10-CM

## 2016-06-16 MED ORDER — ABIRATERONE ACETATE 250 MG PO TABS: 1000.0000 mg | ORAL_TABLET | Freq: Every day | ORAL | 4 refills | Status: DC

## 2016-06-30 ENCOUNTER — Encounter
Admission: RE | Admit: 2016-06-30 | Discharge: 2016-06-30 | Disposition: A | Discharge status: Home / Self Care | Payer: Medicare Other | Source: Ambulatory Visit | Attending: Internal Medicine | Admitting: Internal Medicine

## 2016-06-30 ENCOUNTER — Ambulatory Visit
Admission: RE | Admit: 2016-06-30 | Discharge: 2016-06-30 | Disposition: A | Discharge status: Home / Self Care | Payer: Medicare Other | Source: Ambulatory Visit | Attending: Internal Medicine | Admitting: Internal Medicine

## 2016-06-30 DIAGNOSIS — N200 Calculus of kidney: Secondary | ICD-10-CM | POA: Insufficient documentation

## 2016-06-30 DIAGNOSIS — C61 Malignant neoplasm of prostate: Secondary | ICD-10-CM

## 2016-06-30 DIAGNOSIS — I700 Atherosclerosis of aorta: Secondary | ICD-10-CM | POA: Diagnosis not present

## 2016-06-30 DIAGNOSIS — C7951 Secondary malignant neoplasm of bone: Secondary | ICD-10-CM | POA: Diagnosis not present

## 2016-06-30 MED ORDER — TECHNETIUM TC 99M MEDRONATE IV KIT
25.0000 | PACK | Freq: Once | INTRAVENOUS | Status: AC | PRN
  Administered 2016-06-30: 22.29 via INTRAVENOUS

## 2016-06-30 MED ORDER — IOPAMIDOL (ISOVUE-300) INJECTION 61%
100.0000 mL | Freq: Once | INTRAVENOUS | Status: AC | PRN
  Administered 2016-06-30: 100 mL via INTRAVENOUS

## 2016-07-05 ENCOUNTER — Inpatient Hospital Stay: Payer: Medicare Other

## 2016-07-05 ENCOUNTER — Other Ambulatory Visit: Payer: Medicare Other

## 2016-07-05 ENCOUNTER — Encounter: Payer: Self-pay | Admitting: Internal Medicine

## 2016-07-05 ENCOUNTER — Inpatient Hospital Stay: Payer: Medicare Other | Attending: Internal Medicine

## 2016-07-05 ENCOUNTER — Ambulatory Visit: Payer: Medicare Other

## 2016-07-05 ENCOUNTER — Inpatient Hospital Stay (HOSPITAL_BASED_OUTPATIENT_CLINIC_OR_DEPARTMENT_OTHER): Payer: Medicare Other | Admitting: Internal Medicine

## 2016-07-05 DIAGNOSIS — Z006 Encounter for examination for normal comparison and control in clinical research program: Secondary | ICD-10-CM | POA: Insufficient documentation

## 2016-07-05 DIAGNOSIS — R040 Epistaxis: Secondary | ICD-10-CM | POA: Insufficient documentation

## 2016-07-05 DIAGNOSIS — Z79899 Other long term (current) drug therapy: Secondary | ICD-10-CM

## 2016-07-05 DIAGNOSIS — C7951 Secondary malignant neoplasm of bone: Secondary | ICD-10-CM

## 2016-07-05 DIAGNOSIS — C61 Malignant neoplasm of prostate: Secondary | ICD-10-CM | POA: Diagnosis not present

## 2016-07-05 DIAGNOSIS — J0100 Acute maxillary sinusitis, unspecified: Secondary | ICD-10-CM | POA: Insufficient documentation

## 2016-07-05 DIAGNOSIS — C775 Secondary and unspecified malignant neoplasm of intrapelvic lymph nodes: Secondary | ICD-10-CM | POA: Insufficient documentation

## 2016-07-05 DIAGNOSIS — Z79818 Long term (current) use of other agents affecting estrogen receptors and estrogen levels: Secondary | ICD-10-CM

## 2016-07-05 LAB — CBC WITH DIFFERENTIAL/PLATELET
Basophils Absolute: 0 10*3/uL (ref 0–0.1)
Basophils Relative: 1 %
EOS ABS: 0.1 10*3/uL (ref 0–0.7)
EOS PCT: 2 %
HCT: 40.6 % (ref 40.0–52.0)
Hemoglobin: 14 g/dL (ref 13.0–18.0)
LYMPHS ABS: 1.2 10*3/uL (ref 1.0–3.6)
LYMPHS PCT: 19 %
MCH: 33.4 pg (ref 26.0–34.0)
MCHC: 34.5 g/dL (ref 32.0–36.0)
MCV: 96.7 fL (ref 80.0–100.0)
MONO ABS: 0.6 10*3/uL (ref 0.2–1.0)
Monocytes Relative: 10 %
Neutro Abs: 4.3 10*3/uL (ref 1.4–6.5)
Neutrophils Relative %: 68 %
PLATELETS: 186 10*3/uL (ref 150–440)
RBC: 4.2 MIL/uL — ABNORMAL LOW (ref 4.40–5.90)
RDW: 13.4 % (ref 11.5–14.5)
WBC: 6.3 10*3/uL (ref 3.8–10.6)

## 2016-07-05 LAB — COMPREHENSIVE METABOLIC PANEL
ALT: 12 U/L — ABNORMAL LOW (ref 17–63)
ANION GAP: 4 — AB (ref 5–15)
AST: 16 U/L (ref 15–41)
Albumin: 4.3 g/dL (ref 3.5–5.0)
Alkaline Phosphatase: 51 U/L (ref 38–126)
BUN: 17 mg/dL (ref 6–20)
CHLORIDE: 106 mmol/L (ref 101–111)
CO2: 29 mmol/L (ref 22–32)
CREATININE: 0.89 mg/dL (ref 0.61–1.24)
Calcium: 9.2 mg/dL (ref 8.9–10.3)
Glucose, Bld: 93 mg/dL (ref 65–99)
POTASSIUM: 3.8 mmol/L (ref 3.5–5.1)
SODIUM: 139 mmol/L (ref 135–145)
Total Bilirubin: 0.9 mg/dL (ref 0.3–1.2)
Total Protein: 7.1 g/dL (ref 6.5–8.1)

## 2016-07-05 LAB — PSA: PSA: 14.22 ng/mL — AB (ref 0.00–4.00)

## 2016-07-05 MED ORDER — DENOSUMAB 120 MG/1.7ML ~~LOC~~ SOLN
120.0000 mg | Freq: Once | SUBCUTANEOUS | Status: AC
  Administered 2016-07-05: 120 mg via SUBCUTANEOUS
  Filled 2016-07-05: qty 1.7

## 2016-07-05 NOTE — Progress Notes (Signed)
Patient has a sinus infection. Otherwise no complaints.

## 2016-07-05 NOTE — Progress Notes (Signed)
The patient returns to clinic today for consideration of cycle 22 treatment on the Alliance [id number] Prostate study receiving daily Enzalutamide, Abiraterone and Prednisone. VS stable, BP slightly elevated but not clinically significant. The patient reports he is doing well but developed a sinus drainage on Sunday. He reports sinus symptoms with slight headache, coughing, bloody drainage from nose and congestion. Has been taking OTC [employer] brand Acetaminophen 325mg  and Phenylephrine. Dr. Tish Men recommended taking Claritin or Sudafed. No antibiotics recommended at this time unless symptoms increase. Dr. [name] also recommended humidifier or vaseline for the nose. Dr. [name] examined the patient and reviewed CT and Bone Scans with him. All scans showed improvement of disease. CBC and chemistries all reviewed by Dr. [name] and all within acceptable parameters. PSA not resulted today, patient will call tomorrow for results. The patient reports that he continues to have some fatigue which he experiences more so when he sits down for long periods of time or when he tries to watch TV. He continues to use the essential oil cream but not as often and reports the right shoulder pain has resolved for now. Patient reports continued hot flashes that have remained the same-no change. The patient taking all study drug as prescribed and this was verified with medication diaries which he completed and returned today. He also completed the PK questionnaire and returned Enzalutamide bottle with 8 capsules remaining. He was given new medication diaries and PK Questionnaire to complete prior to next visit. Pharmacy dispensed a new bottle of 120 capsules of study drug- Enzalutamide to the patient for cycle 22. He will receive the Xgeva injection today and his return appointment was scheduled for 08/02/2016. Adverse events and attributions were assessed as follows: Mirian Mo, RN, BSN 07/05/2016 11:05 AM    Adverse  Event Log  Study/Protocol: Alliance [id number] Cycle: 21 adverse events  Event Grade Onset Date Resolved Date Drug Name Enzalutamide + Abiraterone Attribution Treatment Comments  Fatigue Grade 1 11/25/14   Probably    Hot Flashes Grade 1 11/25/14   Unrelated  r/t HST  Headache Grade 1 05/29/2016 05/30/2016  unrelated    Sinusitis Grade 1 07/02/2016 ongoing  unrelated OTC [employer] brand Tylenol cold Recommend Claritin, Sudafed and humidifier   Mirian Mo, RN, BSN 07/05/2016 11:05 AM

## 2016-07-05 NOTE — Progress Notes (Signed)
[place] OFFICE PROGRESS NOTE  Patient Care Team: Provider Not In System as PCP - General Royston Cowper, MD (Urology)  Cancer of prostate [facility])   Staging form: Prostate, AJCC 7th Edition     Clinical: Stage IV (T2, M1) - Signed by Evlyn Kanner, NP on 01/07/2015    Oncology History   # FEB 2014- Prostate cancer Stage II [T2N0]; PA-18;   # May 2015- PSA 90 on ADT; CT/Bone scan-extensive mets;  casodex+ Trelstar  # June 2016- Castration resistant prostate cancer/[facility] protocol- ZYTIGA plus minus XTANDI; June 28th-CT-C/A/P- Stable Retrocrural/RP/Pelvic LN;Bone scan- Stable sclerotic lesions   # Xgeva     Cancer of prostate ([place])   08/17/2012 Initial Diagnosis    Cancer of prostate, stage II      12/03/2013 Progression         10/13/2014 Progression          Prostate cancer metastatic to multiple sites ([place])   01/19/2016 Initial Diagnosis    Prostate cancer metastatic to multiple sites [facility])        INTERVAL HISTORY:  William Wright 65 y.o.  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  Patient noted to have "sinus congestion" in the last 3-4 days. He has been using phenylephrine OTC. He had episode of bloody nasal discharge.  Denies any unusual shortness of breath or cough. No chest pain or shortness of breath. No seizures. No weight loss. No bone pain.  Denies any jaw pain. Intermittent hot flashes. No swelling in the legs. No nausea or vomiting. No significant fatigue.   REVIEW OF SYSTEMS:  A complete 10 point review of system is done which is negative except mentioned above/history of present illness.   PAST MEDICAL HISTORY :  Past Medical History:  Diagnosis Date  . Cancer of prostate ([place]) 11/18/2014  . Prostate cancer ([place])     PAST SURGICAL HISTORY :   Past  Surgical History:  Procedure Laterality Date  . EXTRACORPOREAL SHOCK WAVE LITHOTRIPSY Left 05/20/2015   Procedure: EXTRACORPOREAL SHOCK WAVE LITHOTRIPSY (ESWL);  Surgeon: Royston Cowper, MD;  Location: [facility] ORS;  Service: Urology;  Laterality: Left;    FAMILY HISTORY :  No family history on file.  SOCIAL HISTORY:   Social History  Substance Use Topics  . Smoking status: Never Smoker  . Smokeless tobacco: Not on file  . Alcohol use No    ALLERGIES:  is allergic to no known allergies.  MEDICATIONS:  Current Outpatient Prescriptions  Medication Sig Dispense Refill  . abiraterone Acetate (ZYTIGA) 250 MG tablet Take 4 tablets (1,000 mg total) by mouth daily. Take on an empty stomach 1 hour before or 2 hours after a meal 120 tablet 4  . fluticasone (FLONASE) 50 MCG/ACT nasal spray Place 1 spray into both nostrils daily. 16 g 2  . Investigational enzalutamide 40 MG capsule [facility]ANCE [id number] Take 4 capsules (160 mg total) by mouth daily. Take with or without food.Swallow whole. Do not crush, or open capsules. 120 capsule 0  . Investigational enzalutamide 40 MG capsule [facility] [id number] Take 4 capsules (160 mg total) by mouth daily. Take with or without food.Swallow whole. Do not crush, or open capsules. 120 capsule 0  . Investigational enzalutamide 40 MG capsule [facility] [id number] Take 4 capsules (160 mg total) by mouth daily. Take with  or without food.Swallow whole. Do not crush, or open capsules. 120 capsule 0  . Investigational enzalutamide 40 MG capsule [facility] [id number] Take 4 capsules (160 mg total) by mouth daily. Take with or without food.Swallow whole. Do not crush, or open capsules. 120 capsule 0  . ondansetron (ZOFRAN ODT) 8 MG disintegrating tablet Take 1 tablet (8 mg total) by mouth every 6 (six) hours as needed for nausea or vomiting. 10 tablet 3  . OxyCODONE HCl, Abuse Deter, (OXAYDO) 5 MG TABA Take 1 tablet by mouth every 4 (four) hours as needed. 30 tablet 0  . predniSONE (DELTASONE)  5 MG tablet Take 1 tablet (5 mg total) by mouth 2 (two) times daily. 60 tablet 0  . tamsulosin (FLOMAX) 0.4 MG CAPS capsule Take 1 capsule (0.4 mg total) by mouth daily. 30 capsule 11  . Triptorelin Pamoate (TRELSTAR) 22.5 MG injection Inject 22.5 mg into the muscle every 6 (six) months.     No current facility-administered medications for this visit.     PHYSICAL EXAMINATION: ECOG PERFORMANCE STATUS: 0 - Asymptomatic  BP (!) 143/82 (BP Location: Left Arm, Patient Position: Sitting)   Pulse 78   Temp (!) 96.2 F (35.7 C) (Tympanic)   Resp 18   Wt 169 lb 8 oz (76.9 kg)   BMI 24.32 kg/m   Filed Weights   07/05/16 1007  Weight: 169 lb 8 oz (76.9 kg)    GENERAL: Well-nourished well-developed; Alert, no distress and comfortable. He is Alone. EYES: no pallor or icterus OROPHARYNX: no thrush or ulceration; good dentition  NECK: supple, no masses felt LYMPH:  no palpable lymphadenopathy in the cervical, axillary or inguinal regions LUNGS: clear to auscultation and  No wheeze or crackles HEART/CVS: regular rate & rhythm and no murmurs; No lower extremity edema ABDOMEN:abdomen soft, non-tender and normal bowel sounds Musculoskeletal:no cyanosis of digits and no clubbing  PSYCH: alert & oriented x 3 with fluent speech NEURO: no focal motor/sensory deficits SKIN:  no rashes or significant lesions  LABORATORY DATA:  I have reviewed the data as listed    Component Value Date/Time   NA 139 07/05/2016 0927   NA 137 11/12/2014 1415   K 3.8 07/05/2016 0927   K 4.1 11/12/2014 1415   CL 106 07/05/2016 0927   CL 105 11/12/2014 1415   CO2 29 07/05/2016 0927   CO2 24 11/12/2014 1415   GLUCOSE 93 07/05/2016 0927   GLUCOSE 98 11/12/2014 1415   BUN 17 07/05/2016 0927   BUN 24 (H) 11/12/2014 1415   CREATININE 0.89 07/05/2016 0927   CREATININE 1.02 11/12/2014 1415   CALCIUM 9.2 07/05/2016 0927   CALCIUM 9.2 11/12/2014 1415   PROT 7.1 07/05/2016 0927   PROT 7.4 11/12/2014 1415   ALBUMIN  4.3 07/05/2016 0927   ALBUMIN 4.4 11/12/2014 1415   AST 16 07/05/2016 0927   AST 23 11/12/2014 1415   ALT 12 (L) 07/05/2016 0927   ALT 16 (L) 11/12/2014 1415   ALKPHOS 51 07/05/2016 0927   ALKPHOS 103 11/12/2014 1415   BILITOT 0.9 07/05/2016 0927   BILITOT 0.4 11/12/2014 1415   GFRNONAA >60 07/05/2016 0927   GFRNONAA >60 11/12/2014 1415   GFRAA >60 07/05/2016 0927   GFRAA >60 11/12/2014 1415    No results found for: SPEP, UPEP  Lab Results  Component Value Date   WBC 6.3 07/05/2016   NEUTROABS 4.3 07/05/2016   HGB 14.0 07/05/2016   HCT 40.6 07/05/2016   MCV 96.7 07/05/2016  PLT 186 07/05/2016      Chemistry      Component Value Date/Time   NA 139 07/05/2016 0927   NA 137 11/12/2014 1415   K 3.8 07/05/2016 0927   K 4.1 11/12/2014 1415   CL 106 07/05/2016 0927   CL 105 11/12/2014 1415   CO2 29 07/05/2016 0927   CO2 24 11/12/2014 1415   BUN 17 07/05/2016 0927   BUN 24 (H) 11/12/2014 1415   CREATININE 0.89 07/05/2016 0927   CREATININE 1.02 11/12/2014 1415      Component Value Date/Time   CALCIUM 9.2 07/05/2016 0927   CALCIUM 9.2 11/12/2014 1415   ALKPHOS 51 07/05/2016 0927   ALKPHOS 103 11/12/2014 1415   AST 16 07/05/2016 0927   AST 23 11/12/2014 1415   ALT 12 (L) 07/05/2016 0927   ALT 16 (L) 11/12/2014 1415   BILITOT 0.9 07/05/2016 0927   BILITOT 0.4 11/12/2014 1415     Results for Blanchette, PHILLIP L (MRN [id number]) as of 06/07/2016 18:12  Ref. Range 02/16/2016 10:33 03/16/2016 10:22 04/12/2016 09:20 05/10/2016 08:46 06/07/2016 09:51  PSA Latest Ref Range: 0.00 - 4.00 ng/mL 14.96 (H) 13.12 (H) 16.38 (H) 17.38 (H) 13.84 (H)    RADIOGRAPHIC STUDIES: I have personally reviewed the radiological images as listed and agreed with the findings in the report. No results found.   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 Also on  Zytiga plus Xtandi- as per clinical trial.OCT 2017 PSA 17.  # Patient tolerating treatment very well. Sep 2017- CT scans- STABLE disease.    # Epistaxis/ sinus symptoms-recommend antihistamine and nasal saline.  # Bone lesions continue Xgeva; no side effects noted. Continue calcium and vitamin D.  Follow up with me in 4 weeks/ labs/X-geva.    No orders of the defined types were placed in this encounter.  All questions were answered. The patient knows to call the clinic with any problems, questions or concerns.      Cammie Sickle, MD 07/05/2016 6:50 PM

## 2016-07-05 NOTE — Assessment & Plan Note (Addendum)
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 Also on Zytiga plus Xtandi- as per clinical trial.OCT 2017 PSA 17.  # Patient tolerating treatment very well. Sep 2017- CT scans- STABLE disease.    # Epistaxis/ sinus symptoms-recommend antihistamine and nasal saline.  # Bone lesions continue Xgeva; no side effects noted. Continue calcium and vitamin D.  Follow up with me in 4 weeks/ labs/X-geva.

## 2016-07-06 ENCOUNTER — Telehealth: Payer: Self-pay

## 2016-07-06 NOTE — Telephone Encounter (Signed)
Patient called requesting PSA result. Patient satisfied with results, no further questions.  Mirian Mo, RN, BSN 07/06/2016 4:13 PM

## 2016-07-13 ENCOUNTER — Telehealth: Payer: Self-pay | Admitting: *Deleted

## 2016-07-13 ENCOUNTER — Other Ambulatory Visit: Payer: Self-pay | Admitting: *Deleted

## 2016-07-13 DIAGNOSIS — R6883 Chills (without fever): Secondary | ICD-10-CM

## 2016-07-13 DIAGNOSIS — R053 Chronic cough: Secondary | ICD-10-CM

## 2016-07-13 DIAGNOSIS — C61 Malignant neoplasm of prostate: Secondary | ICD-10-CM

## 2016-07-13 DIAGNOSIS — R05 Cough: Secondary | ICD-10-CM

## 2016-07-13 DIAGNOSIS — R0989 Other specified symptoms and signs involving the circulatory and respiratory systems: Secondary | ICD-10-CM

## 2016-07-13 MED ORDER — INV-ENZALUTAMIDE 40 MG CAPS #120 ALLIANCE A031201: 160.0000 mg | ORAL_CAPSULE | Freq: Every day | ORAL | 0 refills | Status: DC

## 2016-07-13 NOTE — Telephone Encounter (Signed)
Received t/c from patient stating he is feeling worse this week. Had cough & congestion when seen in office on 07/05/16. States cough & congestion much worse now. NP cough. Had chills last night. He does not know whether he had a fever, but states he is very tired. Has been taking OTC Claritin and Sudafed as recommended by Dr. B last week, but states this is not helping. Patient requests something else for his cough & congestion.  Marland Kitchenkssw

## 2016-07-13 NOTE — Telephone Encounter (Signed)
Spoke with Dr. Mike Gip regarding William Wright's increased cough and congestion. She states she will order a chest xray and see him in the morning unless he feels bad enough to go to the ED. William Wright states he can wait until morning. He is scheduled for a chest x-ray at 9:45am and then will see Dr. Mike Gip at 10:45am. Patient called and informed of his appointments in the morning and he is agreeable to the times. Inst to [facility] the medical mall for his chest x-ray and pre-register as he does when he has a CT or bone scan, then come here to the Cancer center afterward. [name], BSN, MHA, OCN 07/13/2016 4:51 PM

## 2016-07-13 NOTE — Telephone Encounter (Signed)
Message was sent to Dr. Mike Gip regarding patient's increased cough & congestion as she is on-call and covering in dr. Aletha Halim absence. [name], BSN, MHA, OCN 07/13/2016 3:04 PM

## 2016-07-14 ENCOUNTER — Inpatient Hospital Stay (HOSPITAL_BASED_OUTPATIENT_CLINIC_OR_DEPARTMENT_OTHER): Payer: Medicare Other | Admitting: Hematology and Oncology

## 2016-07-14 ENCOUNTER — Ambulatory Visit
Admission: RE | Admit: 2016-07-14 | Discharge: 2016-07-14 | Disposition: A | Discharge status: Home / Self Care | Payer: Medicare Other | Source: Ambulatory Visit | Attending: Hematology and Oncology | Admitting: Hematology and Oncology

## 2016-07-14 VITALS — BP 119/76 | HR 94 | Temp 96.4°F | Resp 18 | Wt 169.8 lb

## 2016-07-14 DIAGNOSIS — R05 Cough: Secondary | ICD-10-CM

## 2016-07-14 DIAGNOSIS — Z8546 Personal history of malignant neoplasm of prostate: Secondary | ICD-10-CM | POA: Diagnosis not present

## 2016-07-14 DIAGNOSIS — C7951 Secondary malignant neoplasm of bone: Secondary | ICD-10-CM | POA: Diagnosis not present

## 2016-07-14 DIAGNOSIS — Z79818 Long term (current) use of other agents affecting estrogen receptors and estrogen levels: Secondary | ICD-10-CM | POA: Diagnosis not present

## 2016-07-14 DIAGNOSIS — R6883 Chills (without fever): Secondary | ICD-10-CM

## 2016-07-14 DIAGNOSIS — Z79899 Other long term (current) drug therapy: Secondary | ICD-10-CM | POA: Diagnosis not present

## 2016-07-14 DIAGNOSIS — J0100 Acute maxillary sinusitis, unspecified: Secondary | ICD-10-CM | POA: Diagnosis not present

## 2016-07-14 DIAGNOSIS — R0989 Other specified symptoms and signs involving the circulatory and respiratory systems: Secondary | ICD-10-CM | POA: Insufficient documentation

## 2016-07-14 DIAGNOSIS — R053 Chronic cough: Secondary | ICD-10-CM

## 2016-07-14 DIAGNOSIS — C61 Malignant neoplasm of prostate: Secondary | ICD-10-CM

## 2016-07-14 DIAGNOSIS — C775 Secondary and unspecified malignant neoplasm of intrapelvic lymph nodes: Secondary | ICD-10-CM | POA: Diagnosis not present

## 2016-07-14 MED ORDER — AMOXICILLIN-POT CLAVULANATE 875-125 MG PO TABS: 1.0000 | ORAL_TABLET | Freq: Two times a day (BID) | ORAL | 0 refills | Status: DC

## 2016-07-14 NOTE — Progress Notes (Signed)
Patient acute add on today for sinus congestion, cough.  States he has not felt well for a couple of days.  States cough is non-productive.  Had CXR prior to visit today.

## 2016-07-14 NOTE — Progress Notes (Signed)
[facility] day:  07/14/2016  Chief Complaint: William Wright is a 65 y.o. 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  HPI: The patient was last seen in the medical oncology clinic on 07/05/2016 by Dr. [name].  At that time, he noted epistaxis and sinus symptoms.  Antihistamine and nasal saline was recommended.  He contacted the clinic yesterday with symptoms of cough and congestion.  He had chills the night prior.  He denied any fever. He was taking over-the-counter Claritin and Sudafed.  He notes clear sinus drainage.  He noted a little dried blood.  He feels "terrible".  He comments that he usually feels good.  He is sleeping "ok".  Yesterday, he slept for 12 hours.  Two nights ago he had chills.    CXR today reveals no acute cardiopulmonary disease.   Past Medical History:  Diagnosis Date  . Cancer of prostate ([place]) 11/18/2014  . Prostate cancer [facility])     Past Surgical History:  Procedure Laterality Date  . EXTRACORPOREAL SHOCK WAVE LITHOTRIPSY Left 05/20/2015   Procedure: EXTRACORPOREAL SHOCK WAVE LITHOTRIPSY (ESWL);  Surgeon: Royston Cowper, MD;  Location: [facility] ORS;  Service: Urology;  Laterality: Left;    No family history on file.  Social History:  reports that he has never smoked. He does not have any smokeless tobacco history on file. He reports that he does not drink alcohol or use drugs.  He lives in [place].  The patient is alone today.  Allergies:  Allergies  Allergen Reactions  . No Known Allergies     Current Medications: Current Outpatient Prescriptions  Medication Sig Dispense Refill  . abiraterone Acetate (ZYTIGA) 250 MG tablet Take 4 tablets (1,000 mg total) by mouth daily. Take on an empty stomach 1 hour before or 2 hours after a meal 120 tablet 4  . fluticasone (FLONASE) 50 MCG/ACT nasal spray Place 1 spray into both nostrils daily.  16 g 2  . Investigational enzalutamide 40 MG capsule ALLIANCE [id number] Take 4 capsules (160 mg total) by mouth daily. Take with or without food.Swallow whole. Do not crush, or open capsules. 120 capsule 0  . Investigational enzalutamide 40 MG capsule ALLIANCE [id number] Take 4 capsules (160 mg total) by mouth daily. Take with or without food.Swallow whole. Do not crush, or open capsules. 120 capsule 0  . Investigational enzalutamide 40 MG capsule ALLIANCE [id number] Take 4 capsules (160 mg total) by mouth daily. Take with or without food.Swallow whole. Do not crush, or open capsules. 120 capsule 0  . Investigational enzalutamide 40 MG capsule ALLIANCE [id number] Take 4 capsules (160 mg total) by mouth daily. Take with or without food.Swallow whole. Do not crush, or open capsules. 120 capsule 0  . Investigational enzalutamide 40 MG capsule ALLIANCE [id number] Take 4 capsules (160 mg total) by mouth daily. Take with or without food.Swallow whole. Do not crush, or open capsules. 120 capsule 0  . ondansetron (ZOFRAN ODT) 8 MG disintegrating tablet Take 1 tablet (8 mg total) by mouth every 6 (six) hours as needed for nausea or vomiting. 10 tablet 3  . OxyCODONE HCl, Abuse Deter, (OXAYDO) 5 MG TABA Take 1 tablet by mouth every 4 (four) hours as needed. 30 tablet 0  . predniSONE (DELTASONE) 5 MG tablet Take 1 tablet (5 mg total) by mouth 2 (two) times daily. 60 tablet 0  . tamsulosin (FLOMAX) 0.4 MG CAPS  capsule Take 1 capsule (0.4 mg total) by mouth daily. 30 capsule 11  . Triptorelin Pamoate (TRELSTAR) 22.5 MG injection Inject 22.5 mg into the muscle every 6 (six) months.     No current facility-administered medications for this visit.     Review of Systems:  GENERAL:  Feels terrible.  No fevers or sweats.  Chills 2 days ago.  No weight loss. PERFORMANCE STATUS (ECOG):  1 HEENT:  Clear sinus drainage.  Little dried blood.  No visual changes, sore throat, mouth sores or tenderness. Lungs: No shortness of breath.   Cough.  No hemoptysis. Cardiac:  No chest pain, palpitations, orthopnea, or PND. GI:  No nausea, vomiting, diarrhea, constipation, melena or hematochezia. GU:  No urgency, frequency, dysuria, or hematuria. Musculoskeletal:  No back pain.  No joint pain.  No muscle tenderness. Extremities:  No pain or swelling. Skin:  No rashes or skin changes. Neuro:  No headache, numbness or weakness, balance or coordination issues. Endocrine:  No diabetes, thyroid issues, hot flashes or night sweats. Psych:  No mood changes, depression or anxiety.  Slept 12 hours yesterday. Pain:  No focal pain. Review of systems:  All other systems reviewed and found to be negative.  Physical Exam: Blood pressure 119/76, pulse 94, temperature 96.4 F (35.8 C), temperature source Tympanic, resp. rate 18, weight 169 lb 12.1 oz (77 kg). GENERAL:  Well developed, well nourished, gentleman sitting comfortably in the exam room in no acute distress. MENTAL STATUS:  Alert and oriented to person, place and time. HEAD:  Thin gray hair.  Normocephalic, atraumatic, face symmetric, no Cushingoid features. EYES:  Brown eyes.  Pupils equal round and reactive to light and accomodation.  No conjunctivitis or scleral icterus. ENT:  Sinus pressure on palpation.  Oropharynx clear without lesion.  Tongue normal. Mucous membranes moist.  RESPIRATORY:  Clear to auscultation without rales, wheezes or rhonchi. CARDIOVASCULAR:  Regular rate and rhythm without murmur, rub or gallop. ABDOMEN:  Soft, non-tender, with active bowel sounds, and no hepatosplenomegaly.  No masses. SKIN:  No rashes, ulcers or lesions. EXTREMITIES: No edema, no skin discoloration or tenderness.  No palpable cords. LYMPH NODES: No palpable cervical, supraclavicular, axillary or inguinal adenopathy  NEUROLOGICAL: Unremarkable. PSYCH:  Appropriate.   No visits with results within 3 Day(s) from this visit.  Latest known visit with results is:  Appointment on 07/05/2016   Component Date Value Ref Range Status  . WBC 07/05/2016 6.3  3.8 - 10.6 K/uL Final  . RBC 07/05/2016 4.20* 4.40 - 5.90 MIL/uL Final  . Hemoglobin 07/05/2016 14.0  13.0 - 18.0 g/dL Final  . HCT 07/05/2016 40.6  40.0 - 52.0 % Final  . MCV 07/05/2016 96.7  80.0 - 100.0 fL Final  . MCH 07/05/2016 33.4  26.0 - 34.0 pg Final  . MCHC 07/05/2016 34.5  32.0 - 36.0 g/dL Final  . RDW 07/05/2016 13.4  11.5 - 14.5 % Final  . Platelets 07/05/2016 186  150 - 440 K/uL Final  . Neutrophils Relative % 07/05/2016 68  % Final  . Neutro Abs 07/05/2016 4.3  1.4 - 6.5 K/uL Final  . Lymphocytes Relative 07/05/2016 19  % Final  . Lymphs Abs 07/05/2016 1.2  1.0 - 3.6 K/uL Final  . Monocytes Relative 07/05/2016 10  % Final  . Monocytes Absolute 07/05/2016 0.6  0.2 - 1.0 K/uL Final  . Eosinophils Relative 07/05/2016 2  % Final  . Eosinophils Absolute 07/05/2016 0.1  0 - 0.7 K/uL Final  .  Basophils Relative 07/05/2016 1  % Final  . Basophils Absolute 07/05/2016 0.0  0 - 0.1 K/uL Final  . Sodium 07/05/2016 139  135 - 145 mmol/L Final  . Potassium 07/05/2016 3.8  3.5 - 5.1 mmol/L Final  . Chloride 07/05/2016 106  101 - 111 mmol/L Final  . CO2 07/05/2016 29  22 - 32 mmol/L Final  . Glucose, Bld 07/05/2016 93  65 - 99 mg/dL Final  . BUN 07/05/2016 17  6 - 20 mg/dL Final  . Creatinine, Ser 07/05/2016 0.89  0.61 - 1.24 mg/dL Final  . Calcium 07/05/2016 9.2  8.9 - 10.3 mg/dL Final  . Total Protein 07/05/2016 7.1  6.5 - 8.1 g/dL Final  . Albumin 07/05/2016 4.3  3.5 - 5.0 g/dL Final  . AST 07/05/2016 16  15 - 41 U/L Final  . ALT 07/05/2016 12* 17 - 63 U/L Final  . Alkaline Phosphatase 07/05/2016 51  38 - 126 U/L Final  . Total Bilirubin 07/05/2016 0.9  0.3 - 1.2 mg/dL Final  . GFR calc non Af Amer 07/05/2016 >60  >60 mL/min Final  . GFR calc Af Amer 07/05/2016 >60  >60 mL/min Final   Comment: (NOTE) The eGFR has been calculated using the CKD EPI equation. This calculation has not been validated in all clinical  situations. eGFR's persistently <60 mL/min signify possible Chronic Kidney Disease.   . Anion gap 07/05/2016 4* 5 - 15 Final  . PSA 07/05/2016 14.22* 0.00 - 4.00 ng/mL Final   Comment: (NOTE) While PSA levels of <=4.0 ng/ml are reported as reference range, some men with levels below 4.0 ng/ml can have prostate cancer and many men with PSA above 4.0 ng/ml do not have prostate cancer.  Other tests such as free PSA, age specific reference ranges, PSA velocity and PSA doubling time may be helpful especially in men less than 8 years old. Performed at [facility]     Assessment:  William Wright is a 65 y.o. male with metastatic prostate cancer on Alliance [phone] prostate study (abiraterone plus enzalutamide).    CXR today reveals no acute cardiopulmonary disease.  CBC on 07/05/2016 was normal.  Symptomatically, he has felt terrible with sinus congestion.  Cough is clear.  He has acute sinusitis.  Plan: 1.  Review CXR.  No evidence of pneumonia. 2.  Rx:  Augmentin 875-125 mg 1 po q 12 hours (dis: 10 day supply). 3.  Patient to contact clinic if symptoms do not improve. 4.  RTC as previously planned.   Lequita Asal, MD  07/14/2016, 4:12 AM

## 2016-07-25 ENCOUNTER — Ambulatory Visit
Admission: RE | Admit: 2016-07-25 | Discharge: 2016-07-25 | Disposition: A | Discharge status: Home / Self Care | Payer: Medicare Other | Source: Ambulatory Visit | Attending: Oncology | Admitting: Oncology

## 2016-07-25 ENCOUNTER — Telehealth: Payer: Self-pay | Admitting: *Deleted

## 2016-07-25 ENCOUNTER — Other Ambulatory Visit: Payer: Self-pay | Admitting: *Deleted

## 2016-07-25 ENCOUNTER — Ambulatory Visit
Admission: AD | Admit: 2016-07-25 | Discharge: 2016-07-25 | Disposition: A | Discharge status: Home / Self Care | Payer: Medicare Other | Source: Ambulatory Visit | Attending: Internal Medicine | Admitting: Internal Medicine

## 2016-07-25 DIAGNOSIS — J069 Acute upper respiratory infection, unspecified: Secondary | ICD-10-CM

## 2016-07-25 DIAGNOSIS — R059 Cough, unspecified: Secondary | ICD-10-CM

## 2016-07-25 DIAGNOSIS — R0989 Other specified symptoms and signs involving the circulatory and respiratory systems: Secondary | ICD-10-CM

## 2016-07-25 DIAGNOSIS — R05 Cough: Secondary | ICD-10-CM | POA: Diagnosis not present

## 2016-07-25 DIAGNOSIS — Z8546 Personal history of malignant neoplasm of prostate: Secondary | ICD-10-CM | POA: Diagnosis not present

## 2016-07-25 DIAGNOSIS — C7951 Secondary malignant neoplasm of bone: Secondary | ICD-10-CM | POA: Insufficient documentation

## 2016-07-25 MED ORDER — LEVOFLOXACIN 500 MG PO TABS: 500.0000 mg | ORAL_TABLET | Freq: Every day | ORAL | 0 refills | Status: DC

## 2016-07-25 NOTE — Telephone Encounter (Signed)
-----   Message from Peggye Fothergill, RN sent at 07/25/2016 10:34 AM EST ----- Regarding: reporting cough/sinus infection not improved Received t/c from William Wright reporting he finished his 10-day prescription of Augmentin yesterday morning - but still no better. Reports he feels as bad as he did when he saw Dr. Mike Gip. Reports "terrible cough" with increased sinus drainage and "feels terrible." Can he get something else for the cough & sinusitis? Thanks, [employer]

## 2016-07-25 NOTE — Telephone Encounter (Signed)
V/o Dr. [name]. Ordered cxr-2 view stat to r/o pneumonia. Order sent to pharmacy for levaquin 500 mg daily x 7 days. Patient made aware of the plan of care. He will go to [facility] location now to cxr. I explained that even though he had a cxr a few weeks ago, md would like to repeat the xray to ensure pt has not developed pneumonia during that interval. Pt states that his cough/congestion is worse at bedtime. c/o post nasal drainage. Reviewed levaquin side effects with patient and encourage pt to increase his po fluid intake to decrease risk factor for renal side effects with drug. I also explained that oral fluids will help pt recover from the upper respiratory infection. He gave verbal understanding of the plan. I will contact him as soon as cxr results are available with further md recommendations.  He thanked me for returning his call.

## 2016-07-26 NOTE — Telephone Encounter (Signed)
Received t/c from Marga Melnick at 9:56am reporting he finished his 10-day prescription of Augmentin yesterday morning - but still no better. Reports he feels as bad as he did when he saw Dr. Mike Gip. Reports "terrible cough" with increased sinus drainage and "feels terrible." Inbasket message sent to Dr. [name] and Renita Papa, RN to report patient has not improved. Dr. [name] to send prescription for Levaquin to patient's local pharmacy and has ordered a chest xray today. Nira Conn has call patient back with instructions. [name], BSN, MHA, OCN 07/25/2016   12:44 PM

## 2016-07-31 ENCOUNTER — Telehealth: Payer: Self-pay

## 2016-07-31 NOTE — Telephone Encounter (Signed)
Patient was contacted today to inform him that his application to [place] patient assistance program to cover cost of Fabio Asa has been approved.  I received confirmation of approval after contacting Wynetta Emery and [place] at [id number].  They will be mailing patient one form to sign and he has been instructed to sign the form and send it back to [employer].  [employer] (provider of medication) will be contacting patient within 24 to 48 hours to arrange delivery of medication.  Patient was appreciative of phone call.

## 2016-08-02 ENCOUNTER — Inpatient Hospital Stay: Payer: Medicare Other | Attending: Internal Medicine | Admitting: Internal Medicine

## 2016-08-02 ENCOUNTER — Encounter: Payer: Self-pay | Admitting: *Deleted

## 2016-08-02 ENCOUNTER — Inpatient Hospital Stay: Payer: Medicare Other

## 2016-08-02 VITALS — BP 112/72 | HR 93 | Temp 97.6°F | Resp 18 | Ht 70.0 in | Wt 172.0 lb

## 2016-08-02 DIAGNOSIS — C61 Malignant neoplasm of prostate: Secondary | ICD-10-CM

## 2016-08-02 DIAGNOSIS — R05 Cough: Secondary | ICD-10-CM | POA: Diagnosis not present

## 2016-08-02 DIAGNOSIS — C7951 Secondary malignant neoplasm of bone: Secondary | ICD-10-CM

## 2016-08-02 DIAGNOSIS — Z006 Encounter for examination for normal comparison and control in clinical research program: Secondary | ICD-10-CM | POA: Diagnosis not present

## 2016-08-02 DIAGNOSIS — Z79899 Other long term (current) drug therapy: Secondary | ICD-10-CM | POA: Diagnosis not present

## 2016-08-02 DIAGNOSIS — Z79818 Long term (current) use of other agents affecting estrogen receptors and estrogen levels: Secondary | ICD-10-CM | POA: Diagnosis not present

## 2016-08-02 DIAGNOSIS — Z7952 Long term (current) use of systemic steroids: Secondary | ICD-10-CM | POA: Diagnosis not present

## 2016-08-02 LAB — CBC WITH DIFFERENTIAL/PLATELET
Basophils Absolute: 0 10*3/uL (ref 0–0.1)
Basophils Relative: 1 %
Eosinophils Absolute: 0.1 10*3/uL (ref 0–0.7)
Eosinophils Relative: 2 %
HEMATOCRIT: 42 % (ref 40.0–52.0)
HEMOGLOBIN: 14.3 g/dL (ref 13.0–18.0)
LYMPHS ABS: 1.7 10*3/uL (ref 1.0–3.6)
Lymphocytes Relative: 39 %
MCH: 33.5 pg (ref 26.0–34.0)
MCHC: 34.2 g/dL (ref 32.0–36.0)
MCV: 98 fL (ref 80.0–100.0)
MONOS PCT: 14 %
Monocytes Absolute: 0.6 10*3/uL (ref 0.2–1.0)
NEUTROS ABS: 1.9 10*3/uL (ref 1.4–6.5)
NEUTROS PCT: 44 %
Platelets: 224 10*3/uL (ref 150–440)
RBC: 4.28 MIL/uL — AB (ref 4.40–5.90)
RDW: 13.6 % (ref 11.5–14.5)
WBC: 4.2 10*3/uL (ref 3.8–10.6)

## 2016-08-02 LAB — COMPREHENSIVE METABOLIC PANEL
ALBUMIN: 4.1 g/dL (ref 3.5–5.0)
ALK PHOS: 53 U/L (ref 38–126)
ALT: 11 U/L — AB (ref 17–63)
ANION GAP: 8 (ref 5–15)
AST: 15 U/L (ref 15–41)
BILIRUBIN TOTAL: 0.8 mg/dL (ref 0.3–1.2)
BUN: 21 mg/dL — AB (ref 6–20)
CALCIUM: 9.4 mg/dL (ref 8.9–10.3)
CO2: 28 mmol/L (ref 22–32)
CREATININE: 0.96 mg/dL (ref 0.61–1.24)
Chloride: 105 mmol/L (ref 101–111)
GFR calc Af Amer: >60 mL/min (ref >60)
GFR calc non Af Amer: >60 mL/min (ref >60)
GLUCOSE: 91 mg/dL (ref 65–99)
Potassium: 3.9 mmol/L (ref 3.5–5.1)
Sodium: 141 mmol/L (ref 135–145)
TOTAL PROTEIN: 7.1 g/dL (ref 6.5–8.1)

## 2016-08-02 LAB — PSA: PSA: 15.38 ng/mL — ABNORMAL HIGH (ref 0.00–4.00)

## 2016-08-02 MED ORDER — VALACYCLOVIR HCL 1 G PO TABS: 1000.0000 mg | ORAL_TABLET | Freq: Two times a day (BID) | ORAL | 6 refills | Status: AC

## 2016-08-02 MED ORDER — DENOSUMAB 120 MG/1.7ML ~~LOC~~ SOLN
120.0000 mg | Freq: Once | SUBCUTANEOUS | Status: AC
  Administered 2016-08-02: 120 mg via SUBCUTANEOUS
  Filled 2016-08-02: qty 1.7

## 2016-08-02 NOTE — Progress Notes (Signed)
[facility] OFFICE PROGRESS NOTE  Patient Care Team: Provider Not In System as PCP - General Royston Cowper, MD (Urology)  Cancer of prostate [facility])   Staging form: Prostate, AJCC 7th Edition     Clinical: Stage IV (T2, M1) - Signed by Evlyn Kanner, NP on 01/07/2015    Oncology History   # FEB 2014- Prostate cancer Stage II [T2N0]; PA-18;   # May 2015- PSA 90 on ADT; CT/Bone scan-extensive mets;  casodex+ Trelstar  # June 2016- Castration resistant prostate cancer/Alliance protocol- ZYTIGA plus minus XTANDI; June 28th-CT-C/A/P- Stable Retrocrural/RP/Pelvic LN;Bone scan- Stable sclerotic lesions   # Xgeva     Cancer of prostate ([place])   08/17/2012 Initial Diagnosis    Cancer of prostate, stage II      12/03/2013 Progression         10/13/2014 Progression          Prostate cancer metastatic to multiple sites ([place])   01/19/2016 Initial Diagnosis    Prostate cancer metastatic to multiple sites [facility])        INTERVAL HISTORY:  William Wright 66 y.o.  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  In the interim patient was treated with Levaquin for cough- currently resolved. Patient's recent chest x-ray was negative for any pneumonic process.  Denies any unusual shortness of breath or cough. No chest pain or shortness of breath. No seizures. No weight loss. No bone pain.  Denies any jaw pain. Intermittent hot flashes. No swelling in the legs. No nausea or vomiting. No significant fatigue.   REVIEW OF SYSTEMS:  A complete 10 point review of system is done which is negative except mentioned above/history of present illness.   PAST MEDICAL HISTORY :  Past Medical History:  Diagnosis Date  . Cancer of prostate ([place]) 11/18/2014  . Prostate cancer ([place])     PAST SURGICAL HISTORY :   Past Surgical History:  Procedure Laterality Date  .  EXTRACORPOREAL SHOCK WAVE LITHOTRIPSY Left 05/20/2015   Procedure: EXTRACORPOREAL SHOCK WAVE LITHOTRIPSY (ESWL);  Surgeon: Royston Cowper, MD;  Location: [facility] ORS;  Service: Urology;  Laterality: Left;    FAMILY HISTORY :  No family history on file.  SOCIAL HISTORY:   Social History  Substance Use Topics  . Smoking status: Never Smoker  . Smokeless tobacco: Not on file  . Alcohol use No    ALLERGIES:  is allergic to no known allergies.  MEDICATIONS:  Current Outpatient Prescriptions  Medication Sig Dispense Refill  . abiraterone Acetate (ZYTIGA) 250 MG tablet Take 4 tablets (1,000 mg total) by mouth daily. Take on an empty stomach 1 hour before or 2 hours after a meal 120 tablet 4  . fluticasone (FLONASE) 50 MCG/ACT nasal spray Place 1 spray into both nostrils daily. 16 g 2  . Investigational enzalutamide 40 MG capsule ALLIANCE [id number] Take 4 capsules (160 mg total) by mouth daily. Take with or without food.Swallow whole. Do not crush, or open capsules. 120 capsule 0  . predniSONE (DELTASONE) 5 MG tablet Take 1 tablet (5 mg total) by mouth 2 (two) times daily. 60 tablet 0  . tamsulosin (FLOMAX) 0.4 MG CAPS capsule Take 1 capsule (0.4 mg total) by mouth daily. 30 capsule 11  . Triptorelin Pamoate (TRELSTAR) 22.5 MG injection Inject 22.5 mg into the muscle every 6 (six) months.    . valACYclovir (VALTREX) 1000  MG tablet Take 1 tablet (1,000 mg total) by mouth 2 (two) times daily. 60 tablet 6   No current facility-administered medications for this visit.    Facility-Administered Medications Ordered in Other Visits  Medication Dose Route Frequency Provider Last Rate Last Dose  . denosumab (XGEVA) injection 120 mg  120 mg Subcutaneous Once Cammie Sickle, MD        PHYSICAL EXAMINATION: ECOG PERFORMANCE STATUS: 0 - Asymptomatic  BP 112/72 (Patient Position: Sitting)   Pulse 93   Temp 97.6 F (36.4 C) (Tympanic)   Resp 18   Ht 5\' 10"  (1.778 m)   Wt 171 lb 15.3 oz (78 kg)    BMI 24.67 kg/m   Filed Weights   08/02/16 1044  Weight: 171 lb 15.3 oz (78 kg)    GENERAL: Well-nourished well-developed; Alert, no distress and comfortable. He is Alone. EYES: no pallor or icterus OROPHARYNX: no thrush or ulceration; good dentition  NECK: supple, no masses felt LYMPH:  no palpable lymphadenopathy in the cervical, axillary or inguinal regions LUNGS: clear to auscultation and  No wheeze or crackles HEART/CVS: regular rate & rhythm and no murmurs; No lower extremity edema ABDOMEN:abdomen soft, non-tender and normal bowel sounds Musculoskeletal:no cyanosis of digits and no clubbing  PSYCH: alert & oriented x 3 with fluent speech NEURO: no focal motor/sensory deficits SKIN:  no rashes or significant lesions  LABORATORY DATA:  I have reviewed the data as listed    Component Value Date/Time   NA 141 08/02/2016 1025   NA 137 11/12/2014 1415   K 3.9 08/02/2016 1025   K 4.1 11/12/2014 1415   CL 105 08/02/2016 1025   CL 105 11/12/2014 1415   CO2 28 08/02/2016 1025   CO2 24 11/12/2014 1415   GLUCOSE 91 08/02/2016 1025   GLUCOSE 98 11/12/2014 1415   BUN 21 (H) 08/02/2016 1025   BUN 24 (H) 11/12/2014 1415   CREATININE 0.96 08/02/2016 1025   CREATININE 1.02 11/12/2014 1415   CALCIUM 9.4 08/02/2016 1025   CALCIUM 9.2 11/12/2014 1415   PROT 7.1 08/02/2016 1025   PROT 7.4 11/12/2014 1415   ALBUMIN 4.1 08/02/2016 1025   ALBUMIN 4.4 11/12/2014 1415   AST 15 08/02/2016 1025   AST 23 11/12/2014 1415   ALT 11 (Wright) 08/02/2016 1025   ALT 16 (Wright) 11/12/2014 1415   ALKPHOS 53 08/02/2016 1025   ALKPHOS 103 11/12/2014 1415   BILITOT 0.8 08/02/2016 1025   BILITOT 0.4 11/12/2014 1415   GFRNONAA >60 08/02/2016 1025   GFRNONAA >60 11/12/2014 1415   GFRAA >60 08/02/2016 1025   GFRAA >60 11/12/2014 1415    No results found for: SPEP, UPEP  Lab Results  Component Value Date   WBC 4.2 08/02/2016   NEUTROABS 1.9 08/02/2016   HGB 14.3 08/02/2016   HCT 42.0 08/02/2016   MCV  98.0 08/02/2016   PLT 224 08/02/2016      Chemistry      Component Value Date/Time   NA 141 08/02/2016 1025   NA 137 11/12/2014 1415   K 3.9 08/02/2016 1025   K 4.1 11/12/2014 1415   CL 105 08/02/2016 1025   CL 105 11/12/2014 1415   CO2 28 08/02/2016 1025   CO2 24 11/12/2014 1415   BUN 21 (H) 08/02/2016 1025   BUN 24 (H) 11/12/2014 1415   CREATININE 0.96 08/02/2016 1025   CREATININE 1.02 11/12/2014 1415      Component Value Date/Time   CALCIUM 9.4 08/02/2016 1025  CALCIUM 9.2 11/12/2014 1415   ALKPHOS 53 08/02/2016 1025   ALKPHOS 103 11/12/2014 1415   AST 15 08/02/2016 1025   AST 23 11/12/2014 1415   ALT 11 (Wright) 08/02/2016 1025   ALT 16 (Wright) 11/12/2014 1415   BILITOT 0.8 08/02/2016 1025   BILITOT 0.4 11/12/2014 1415     Results for Encarnacion, William Wright (MRN [id number]) as of 08/02/2016 11:30  Ref. Range 03/16/2016 10:22 04/12/2016 09:20 05/10/2016 08:46 06/07/2016 09:51 07/05/2016 09:27  PSA Latest Ref Range: 0.00 - 4.00 ng/mL 13.12 (H) 16.38 (H) 17.38 (H) 13.84 (H) 14.22 (H)     RADIOGRAPHIC STUDIES: I have personally reviewed the radiological images as listed and agreed with the findings in the report. No results found.   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 Also on Zytiga plus Xtandi- as per clinical trial. DEC 2017 PSA 14. No obvious signs of clinical progression.  # Patient tolerating treatment very well. Sep 2017- CT scans- STABLE disease.    # Acute Bronchitis- CXR-N; s/p Levaquin-  resolved.  # Bone lesions continue Xgeva; no side effects noted. Continue calcium and vitamin D.  Follow up with me in 4 weeks/ labs/X-geva.    No orders of the defined types were placed in this encounter.  All questions were answered. The patient knows to call the clinic with any problems, questions or concerns.       Cammie Sickle, MD 08/02/2016 12:12 PM

## 2016-08-02 NOTE — Progress Notes (Signed)
Patient states he is due for the Trelstar injection this month. He would like to have this medication given in cancer center.  He has recovered from his upper respiratory infection.  He is currently on Zytiga with prednisone. He has not missed any dosing. He has tolerating the zytiga without any side effects.

## 2016-08-02 NOTE — Assessment & Plan Note (Addendum)
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 Also on Zytiga plus Xtandi- as per clinical trial. DEC 2017 PSA 14. No obvious signs of clinical progression.  # Patient tolerating treatment very well. Sep 2017- CT scans- STABLE disease.    # Acute Bronchitis- CXR-N; s/p Levaquin-  resolved.  # Bone lesions continue Xgeva; no side effects noted. Continue calcium and vitamin D.  Follow up with me in 4 weeks/ labs/X-geva.

## 2016-08-02 NOTE — Progress Notes (Signed)
William Wright returns to clinic today for consideration of cycle 23 treatment on the Alliance [id number] Prostate study receiving daily Enzalutamide, Abiraterone and Prednisone. VS stable. CBC and chemistries all within acceptable parameters. PSA has not yet resulted. Patient reports his sinus drainage, headache, congestion and cough symptoms resolved on 07/31/16 after completing the Levaquin Dr. [name] had prescribed for him. He also took 10 days of Augmentin previously for these symptoms with no improvement per patient's report. The patient reports that he continues to have some fatigue and hot flashes that have remained about the same intensity. [name] denies experiencing any pain today. He returned his medication calendars which verify that he is taking the Enzalutamide, abiraterone and prednisone as prescribed, and returned the completed PK questionnaire as well. Patient returned his old bottle of enzalutamide with 8 capsules remaining. This was exchanged in the pharmacy and a new bottle of 120 capsules of enzalutamide was dispensed to patient. New medication calendars for cycle 23 were also given to [name]. He will receive an Xgeva injection today and his return appointment for the study was scheduled for 08/30/2016. He will also return next week to receive his 6 month Trelstar injection per Dr. [name]. Adverse events and attributions were assessed as follows:   Adverse Event Log  Study/Protocol: Alliance [id number] Cycle: 22 adverse events  Event Grade Onset Date Resolved Date Drug Name Enzalutamide + Abiraterone Attribution Treatment Comments  Fatigue Grade 1 11/25/14   Probably    Hot Flashes Grade 1 11/25/14   Unrelated  r/t HST  Sinusitis Worsening Sinusitis Grade 1 Grade 2 07/02/2016 07/13/16 07/13/16 07/31/16  unrelated OTC [employer] brand Tylenol cold Recommend Claritin, Sudafed and humidifier   Headache Grade 1 07/13/17 07/31/2016  unrelated  R/T sinusitis  Cough Grade 2  07/05/16 07/31/16  unrelated  CXR negative  [name], BSN, MHA, OCN 08/02/2016 12:11 PM

## 2016-08-04 ENCOUNTER — Encounter: Payer: Self-pay | Admitting: Internal Medicine

## 2016-08-09 ENCOUNTER — Other Ambulatory Visit: Payer: Self-pay | Admitting: *Deleted

## 2016-08-09 ENCOUNTER — Inpatient Hospital Stay: Payer: Medicare Other

## 2016-08-09 DIAGNOSIS — Z7952 Long term (current) use of systemic steroids: Secondary | ICD-10-CM | POA: Diagnosis not present

## 2016-08-09 DIAGNOSIS — Z79818 Long term (current) use of other agents affecting estrogen receptors and estrogen levels: Secondary | ICD-10-CM | POA: Diagnosis not present

## 2016-08-09 DIAGNOSIS — C61 Malignant neoplasm of prostate: Secondary | ICD-10-CM

## 2016-08-09 DIAGNOSIS — Z006 Encounter for examination for normal comparison and control in clinical research program: Secondary | ICD-10-CM | POA: Diagnosis not present

## 2016-08-09 DIAGNOSIS — R05 Cough: Secondary | ICD-10-CM | POA: Diagnosis not present

## 2016-08-09 DIAGNOSIS — C7951 Secondary malignant neoplasm of bone: Secondary | ICD-10-CM | POA: Diagnosis not present

## 2016-08-09 MED ORDER — TRIPTORELIN PAMOATE 22.5 MG IM SUSR
22.5000 mg | Freq: Once | INTRAMUSCULAR | Status: AC
  Administered 2016-08-09: 22.5 mg via INTRAMUSCULAR
  Filled 2016-08-09: qty 22.5

## 2016-08-30 ENCOUNTER — Inpatient Hospital Stay: Payer: Medicare Other | Attending: Internal Medicine | Admitting: Internal Medicine

## 2016-08-30 ENCOUNTER — Inpatient Hospital Stay: Payer: Medicare Other

## 2016-08-30 ENCOUNTER — Encounter: Payer: Self-pay | Admitting: *Deleted

## 2016-08-30 DIAGNOSIS — C61 Malignant neoplasm of prostate: Secondary | ICD-10-CM | POA: Insufficient documentation

## 2016-08-30 DIAGNOSIS — Z79818 Long term (current) use of other agents affecting estrogen receptors and estrogen levels: Secondary | ICD-10-CM | POA: Diagnosis not present

## 2016-08-30 DIAGNOSIS — R6889 Other general symptoms and signs: Secondary | ICD-10-CM | POA: Insufficient documentation

## 2016-08-30 DIAGNOSIS — Z79899 Other long term (current) drug therapy: Secondary | ICD-10-CM | POA: Diagnosis not present

## 2016-08-30 DIAGNOSIS — R5383 Other fatigue: Secondary | ICD-10-CM | POA: Insufficient documentation

## 2016-08-30 DIAGNOSIS — Z006 Encounter for examination for normal comparison and control in clinical research program: Secondary | ICD-10-CM | POA: Insufficient documentation

## 2016-08-30 DIAGNOSIS — C7951 Secondary malignant neoplasm of bone: Secondary | ICD-10-CM | POA: Diagnosis not present

## 2016-08-30 LAB — COMPREHENSIVE METABOLIC PANEL
ALBUMIN: 4.2 g/dL (ref 3.5–5.0)
ALK PHOS: 51 U/L (ref 38–126)
ALT: 12 U/L — AB (ref 17–63)
AST: 18 U/L (ref 15–41)
Anion gap: 7 (ref 5–15)
BUN: 20 mg/dL (ref 6–20)
CALCIUM: 9.3 mg/dL (ref 8.9–10.3)
CHLORIDE: 104 mmol/L (ref 101–111)
CO2: 28 mmol/L (ref 22–32)
CREATININE: 0.91 mg/dL (ref 0.61–1.24)
GFR calc non Af Amer: >60 mL/min (ref >60)
GLUCOSE: 95 mg/dL (ref 65–99)
Potassium: 3.8 mmol/L (ref 3.5–5.1)
SODIUM: 139 mmol/L (ref 135–145)
Total Bilirubin: 0.9 mg/dL (ref 0.3–1.2)
Total Protein: 7.2 g/dL (ref 6.5–8.1)

## 2016-08-30 LAB — CBC WITH DIFFERENTIAL/PLATELET
Basophils Absolute: 0 10*3/uL (ref 0–0.1)
Basophils Relative: 1 %
EOS ABS: 0.1 10*3/uL (ref 0–0.7)
EOS PCT: 3 %
HCT: 40.3 % (ref 40.0–52.0)
HEMOGLOBIN: 13.8 g/dL (ref 13.0–18.0)
LYMPHS ABS: 1.4 10*3/uL (ref 1.0–3.6)
LYMPHS PCT: 37 %
MCH: 33.4 pg (ref 26.0–34.0)
MCHC: 34.3 g/dL (ref 32.0–36.0)
MCV: 97.3 fL (ref 80.0–100.0)
Monocytes Absolute: 0.5 10*3/uL (ref 0.2–1.0)
Monocytes Relative: 13 %
NEUTROS PCT: 46 %
Neutro Abs: 1.7 10*3/uL (ref 1.4–6.5)
PLATELETS: 198 10*3/uL (ref 150–440)
RBC: 4.15 MIL/uL — AB (ref 4.40–5.90)
RDW: 13.5 % (ref 11.5–14.5)
WBC: 3.7 10*3/uL — AB (ref 3.8–10.6)

## 2016-08-30 LAB — PSA: PSA: 12.22 ng/mL — ABNORMAL HIGH (ref 0.00–4.00)

## 2016-08-30 MED ORDER — DENOSUMAB 120 MG/1.7ML ~~LOC~~ SOLN
120.0000 mg | Freq: Once | SUBCUTANEOUS | Status: AC
  Administered 2016-08-30: 120 mg via SUBCUTANEOUS
  Filled 2016-08-30: qty 1.7

## 2016-08-30 MED ORDER — INV-ENZALUTAMIDE 40 MG CAPS #120 ALLIANCE A031201: 160.0000 mg | ORAL_CAPSULE | Freq: Every day | ORAL | 0 refills | Status: DC

## 2016-08-30 MED ORDER — VENLAFAXINE HCL ER 37.5 MG PO CP24: 37.5000 mg | ORAL_CAPSULE | Freq: Every day | ORAL | 4 refills | Status: DC

## 2016-08-30 NOTE — Assessment & Plan Note (Addendum)
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 Also on Zytiga plus Xtandi- as per clinical trial. Jan 2018 PSA 15. No obvious signs of clinical progression.  # Patient tolerating treatment very well. Sep 2017- CT scans- STABLE disease.  Will get scans for bone/ CT.   # Hot flashes- G-2-  dicussed re: effexor. New prescription given.  # Bone lesions continue Xgeva; no side effects noted. Continue calcium and vitamin D.  Follow up with me in 4 weeks/ labs/X-geva. Scans prior.

## 2016-08-30 NOTE — Progress Notes (Signed)
William Wright returns to clinic today for consideration of cycle 24 treatment on the Alliance [id number] Prostate study receiving daily Enzalutamide, Abiraterone and Prednisone. VS remain stable. CBC and chemistries all within acceptable parameters for treatment. PSA has not yet resulted.  Patient reports that he continues to have some fatigue  that has remained about the same intensity, but reports his hot flashes have gotten worse and have become bothersome to him; not limiting his ADL's, but affecting his overall quality of life. Dr. [name] talked with William Wright about trying some low-dose Effexor to see if the hot flashes improve, and patient agrees to try this. William Wright denies experiencing any pain anywhere today. He returned his medication calendars which verify that he is taking the enzalutamide, abiraterone and prednisone as prescribed. He also returned the completed PK questionnaire and his old bottle of enzalutamide with 8 capsules remaining, as expected. This was exchanged in the pharmacy and a new bottle of 120 capsules of enzalutamide was dispensed to patient. New medication calendars for cycle 24 were also given to William Wright. He will receive an Xgeva injection today and we will schedule his CT and bone scans to be performed the week prior to his return appointment on 09/27/2016. Adverse events and attributions were assessed as follows:   Adverse Event Log  Study/Protocol: Alliance [id number] Cycle: 23 adverse events  Event Grade Onset Date Resolved Date Drug Name Enzalutamide + Abiraterone Attribution Treatment Comments  Fatigue Grade 1 11/25/14   Probably    Hot Flashes  Grade 1  Grade 2 11/25/14 08/30/16   Unrelated  r/t HST Effexor Rx given 08/30/16  William Wright, BSN, MHA, OCN 08/30/2016 11:10 AM

## 2016-08-30 NOTE — Progress Notes (Signed)
Patient here today for follow up.  Patient states no new concerns today  

## 2016-08-30 NOTE — Progress Notes (Signed)
[place] OFFICE PROGRESS NOTE  Patient Care Team: Provider Not In System as PCP - General Royston Cowper, MD (Urology)  Cancer of prostate [facility])   Staging form: Prostate, AJCC 7th Edition     Clinical: Stage IV (T2, M1) - Signed by Evlyn Kanner, NP on 01/07/2015    Oncology History   # FEB 2014- Prostate cancer Stage II [T2N0]; PA-18;   # May 2015- PSA 90 on ADT; CT/Bone scan-extensive mets;  casodex+ Trelstar  # June 2016- Castration resistant prostate cancer/Alliance protocol- ZYTIGA plus minus XTANDI; June 28th-CT-C/A/P- Stable Retrocrural/RP/Pelvic LN;Bone scan- Stable sclerotic lesions   # Xgeva     Cancer of prostate ([place])   08/17/2012 Initial Diagnosis    Cancer of prostate, stage II      12/03/2013 Progression         10/13/2014 Progression          Prostate cancer metastatic to multiple sites ([place])   01/19/2016 Initial Diagnosis    Prostate cancer metastatic to multiple sites [facility])        INTERVAL HISTORY:  William Wright 66 y.o.  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  He complains of hot flashes fairly intense- that interrupts his daily lifestyle. Chronic mild fatigue. Denies any bone pain.  Denies any unusual shortness of breath or cough. No chest pain or shortness of breath. No seizures. No weight loss. No bone pain.  Denies any jaw pain.  No swelling in the legs. No nausea or vomiting.   REVIEW OF SYSTEMS:  A complete 10 point review of system is done which is negative except mentioned above/history of present illness.   PAST MEDICAL HISTORY :  Past Medical History:  Diagnosis Date  . Cancer of prostate ([place]) 11/18/2014  . Prostate cancer ([place])     PAST SURGICAL HISTORY :   Past Surgical History:  Procedure Laterality Date  . EXTRACORPOREAL SHOCK WAVE LITHOTRIPSY Left 05/20/2015   Procedure: EXTRACORPOREAL  SHOCK WAVE LITHOTRIPSY (ESWL);  Surgeon: Royston Cowper, MD;  Location: [facility] ORS;  Service: Urology;  Laterality: Left;    FAMILY HISTORY :  No family history on file.  SOCIAL HISTORY:   Social History  Substance Use Topics  . Smoking status: Never Smoker  . Smokeless tobacco: Not on file  . Alcohol use No    ALLERGIES:  is allergic to no known allergies.  MEDICATIONS:  Current Outpatient Prescriptions  Medication Sig Dispense Refill  . abiraterone Acetate (ZYTIGA) 250 MG tablet Take 4 tablets (1,000 mg total) by mouth daily. Take on an empty stomach 1 hour before or 2 hours after a meal 120 tablet 4  . Investigational enzalutamide 40 MG capsule ALLIANCE [id number] Take 4 capsules (160 mg total) by mouth daily. Take with or without food.Swallow whole. Do not crush, or open capsules. 120 capsule 0  . predniSONE (DELTASONE) 5 MG tablet Take 1 tablet (5 mg total) by mouth 2 (two) times daily. 60 tablet 0  . tamsulosin (FLOMAX) 0.4 MG CAPS capsule Take 1 capsule (0.4 mg total) by mouth daily. 30 capsule 11  . Triptorelin Pamoate (TRELSTAR) 22.5 MG injection Inject 22.5 mg into the muscle every 6 (six) months.    . valACYclovir (VALTREX) 1000 MG tablet Take 1 tablet (1,000 mg total) by mouth 2 (two) times daily. 60 tablet 6  . Investigational enzalutamide 40 MG capsule ALLIANCE [id number]  Take 4 capsules (160 mg total) by mouth daily. Take with or without food.Swallow whole. Do not crush, or open capsules. 120 capsule 0  . venlafaxine XR (EFFEXOR-XR) 37.5 MG 24 hr capsule Take 1 capsule (37.5 mg total) by mouth daily with breakfast. 30 capsule 4   No current facility-administered medications for this visit.     PHYSICAL EXAMINATION: ECOG PERFORMANCE STATUS: 0 - Asymptomatic  BP (!) 149/93 (BP Location: Left Arm, Patient Position: Sitting)   Pulse 67   Temp (!) 94.3 F (34.6 C) (Tympanic)   Wt 170 lb (77.1 kg)   BMI 24.39 kg/m   Filed Weights   08/30/16 1033  Weight: 170 lb (77.1 kg)     GENERAL: Well-nourished well-developed; Alert, no distress and comfortable. He is Alone. EYES: no pallor or icterus OROPHARYNX: no thrush or ulceration; good dentition  NECK: supple, no masses felt LYMPH:  no palpable lymphadenopathy in the cervical, axillary or inguinal regions LUNGS: clear to auscultation and  No wheeze or crackles HEART/CVS: regular rate & rhythm and no murmurs; No lower extremity edema ABDOMEN:abdomen soft, non-tender and normal bowel sounds Musculoskeletal:no cyanosis of digits and no clubbing  PSYCH: alert & oriented x 3 with fluent speech NEURO: no focal motor/sensory deficits SKIN:  no rashes or significant lesions  LABORATORY DATA:  I have reviewed the data as listed    Component Value Date/Time   NA 139 08/30/2016 1000   NA 137 11/12/2014 1415   K 3.8 08/30/2016 1000   K 4.1 11/12/2014 1415   CL 104 08/30/2016 1000   CL 105 11/12/2014 1415   CO2 28 08/30/2016 1000   CO2 24 11/12/2014 1415   GLUCOSE 95 08/30/2016 1000   GLUCOSE 98 11/12/2014 1415   BUN 20 08/30/2016 1000   BUN 24 (H) 11/12/2014 1415   CREATININE 0.91 08/30/2016 1000   CREATININE 1.02 11/12/2014 1415   CALCIUM 9.3 08/30/2016 1000   CALCIUM 9.2 11/12/2014 1415   PROT 7.2 08/30/2016 1000   PROT 7.4 11/12/2014 1415   ALBUMIN 4.2 08/30/2016 1000   ALBUMIN 4.4 11/12/2014 1415   AST 18 08/30/2016 1000   AST 23 11/12/2014 1415   ALT 12 (L) 08/30/2016 1000   ALT 16 (L) 11/12/2014 1415   ALKPHOS 51 08/30/2016 1000   ALKPHOS 103 11/12/2014 1415   BILITOT 0.9 08/30/2016 1000   BILITOT 0.4 11/12/2014 1415   GFRNONAA >60 08/30/2016 1000   GFRNONAA >60 11/12/2014 1415   GFRAA >60 08/30/2016 1000   GFRAA >60 11/12/2014 1415    No results found for: SPEP, UPEP  Lab Results  Component Value Date   WBC 3.7 (L) 08/30/2016   NEUTROABS 1.7 08/30/2016   HGB 13.8 08/30/2016   HCT 40.3 08/30/2016   MCV 97.3 08/30/2016   PLT 198 08/30/2016      Chemistry      Component Value  Date/Time   NA 139 08/30/2016 1000   NA 137 11/12/2014 1415   K 3.8 08/30/2016 1000   K 4.1 11/12/2014 1415   CL 104 08/30/2016 1000   CL 105 11/12/2014 1415   CO2 28 08/30/2016 1000   CO2 24 11/12/2014 1415   BUN 20 08/30/2016 1000   BUN 24 (H) 11/12/2014 1415   CREATININE 0.91 08/30/2016 1000   CREATININE 1.02 11/12/2014 1415      Component Value Date/Time   CALCIUM 9.3 08/30/2016 1000   CALCIUM 9.2 11/12/2014 1415   ALKPHOS 51 08/30/2016 1000   ALKPHOS 103 11/12/2014  1415   AST 18 08/30/2016 1000   AST 23 11/12/2014 1415   ALT 12 (L) 08/30/2016 1000   ALT 16 (L) 11/12/2014 1415   BILITOT 0.9 08/30/2016 1000   BILITOT 0.4 11/12/2014 1415     Results for PHIL, KOZAR (MRN [id number]) as of 08/30/2016 10:52  Ref. Range 04/12/2016 09:20 05/10/2016 08:46 06/07/2016 09:51 07/05/2016 09:27 08/02/2016 10:25  PSA Latest Ref Range: 0.00 - 4.00 ng/mL 16.38 (H) 17.38 (H) 13.84 (H) 14.22 (H) 15.38 (H)      RADIOGRAPHIC STUDIES: I have personally reviewed the radiological images as listed and agreed with the findings in the report. No results found.   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 Also on Zytiga plus Xtandi- as per clinical trial. Jan 2018 PSA 15. No obvious signs of clinical progression.  # Patient tolerating treatment very well. Sep 2017- CT scans- STABLE disease.  Will get scans for bone/ CT.   # Hot flashes- G-2-  dicussed re: effexor. New prescription given.  # Bone lesions continue Xgeva; no side effects noted. Continue calcium and vitamin D.  Follow up with me in 4 weeks/ labs/X-geva. Scans prior.   No orders of the defined types were placed in this encounter.  All questions were answered. The patient knows to call the clinic with any problems, questions or concerns.      Cammie Sickle, MD 08/30/2016 4:06 PM

## 2016-08-31 ENCOUNTER — Telehealth: Payer: Self-pay | Admitting: *Deleted

## 2016-08-31 NOTE — Telephone Encounter (Signed)
Received t/c from Polly Cobia requesting his PSA results. Informed patient that his PSA, which was drawn yesterday, is 12.22. Patient is very excited about this and states his PSA has not been this low in 4 years. Also informed patient about his upcoming CT and bone scans - which are scheduled for 09/22/16 at the [place] [facility] facility. Patient to receive injection for Bone scan at 10:00am, then CT scan at 10:30 and finally the bone scan at 1:00pm. Also informed patient that his return appt with Dr. [name] is Wednesday - 09/27/16 at 9:45am. Mr. Tlatelpa states these appointments are also in his my chart, and was reminded that they will be mailed to him along with instructions as well. [name], BSN, MHA, OCN 08/31/2016 11:14 AM

## 2016-09-01 ENCOUNTER — Encounter: Payer: Self-pay | Admitting: Internal Medicine

## 2016-09-16 ENCOUNTER — Encounter: Payer: Self-pay | Admitting: Hematology and Oncology

## 2016-09-22 ENCOUNTER — Encounter
Admission: RE | Admit: 2016-09-22 | Discharge: 2016-09-22 | Disposition: A | Discharge status: Home / Self Care | Payer: Medicare Other | Source: Ambulatory Visit | Attending: Internal Medicine | Admitting: Internal Medicine

## 2016-09-22 ENCOUNTER — Telehealth: Payer: Self-pay | Admitting: *Deleted

## 2016-09-22 ENCOUNTER — Ambulatory Visit
Admission: RE | Admit: 2016-09-22 | Discharge: 2016-09-22 | Disposition: A | Discharge status: Home / Self Care | Payer: Medicare Other | Source: Ambulatory Visit | Attending: Internal Medicine | Admitting: Internal Medicine

## 2016-09-22 DIAGNOSIS — I712 Thoracic aortic aneurysm, without rupture: Secondary | ICD-10-CM | POA: Diagnosis not present

## 2016-09-22 DIAGNOSIS — I2510 Atherosclerotic heart disease of native coronary artery without angina pectoris: Secondary | ICD-10-CM | POA: Diagnosis not present

## 2016-09-22 DIAGNOSIS — N132 Hydronephrosis with renal and ureteral calculous obstruction: Secondary | ICD-10-CM | POA: Insufficient documentation

## 2016-09-22 DIAGNOSIS — K5730 Diverticulosis of large intestine without perforation or abscess without bleeding: Secondary | ICD-10-CM | POA: Insufficient documentation

## 2016-09-22 DIAGNOSIS — I700 Atherosclerosis of aorta: Secondary | ICD-10-CM | POA: Insufficient documentation

## 2016-09-22 DIAGNOSIS — C7951 Secondary malignant neoplasm of bone: Secondary | ICD-10-CM | POA: Diagnosis not present

## 2016-09-22 DIAGNOSIS — K449 Diaphragmatic hernia without obstruction or gangrene: Secondary | ICD-10-CM | POA: Insufficient documentation

## 2016-09-22 DIAGNOSIS — M47894 Other spondylosis, thoracic region: Secondary | ICD-10-CM | POA: Diagnosis not present

## 2016-09-22 DIAGNOSIS — C61 Malignant neoplasm of prostate: Secondary | ICD-10-CM | POA: Diagnosis not present

## 2016-09-22 MED ORDER — IOPAMIDOL (ISOVUE-300) INJECTION 61%
100.0000 mL | Freq: Once | INTRAVENOUS | Status: AC | PRN
  Administered 2016-09-22: 100 mL via INTRAVENOUS

## 2016-09-22 MED ORDER — TECHNETIUM TC 99M MEDRONATE IV KIT
25.0000 | PACK | Freq: Once | INTRAVENOUS | Status: AC | PRN
  Administered 2016-09-22: 22.75 via INTRAVENOUS

## 2016-09-22 NOTE — Telephone Encounter (Signed)
T/C made to patient William Wright to inform of CT showing partially obstructing kidney stone. Patient denies any pain, but states he occasionally experiences a weak urinary stream. Encouraged patient to go ahead and follow up with his urologist before he becomes symptomatic. [name] also reports he keeps pain medication with him at all times due to his history of kidney stones. Results of stable disease on CT scan also reported to patient. He questions results of bone scan and reported to patient that these results are not back yet. [name], BSN, MHA, OCN 09/22/2016 2:24 PM

## 2016-09-22 NOTE — Telephone Encounter (Signed)
IMPRESSION: 1. Recist findings as above. 2. No new or progressive metastatic disease in the chest, abdomen or pelvis. Widespread sclerotic osseous metastases throughout the axial skeleton appear stable. 3. Right UPJ 4 mm stone and separate lumbar ureteral 2 mm stone with new mild right hydronephrosis suggesting partial obstruction. 4. Aortic atherosclerosis. Stable 4.7 cm ascending thoracic aortic aneurysm. Ascending thoracic aortic aneurysm. Recommend semi-annual imaging followup by CTA or MRA and referral to cardiothoracic surgery if not already obtained. This recommendation follows 2010 ACCF/AHA/AATS/ACR/ASA/SCA/SCAI/SIR/STS/SVM Guidelines for the Diagnosis and Management of Patients With Thoracic Aortic Disease. Circulation. 2010; 121: Q676-P950. 5. One vessel coronary atherosclerosis. 6. Tiny hiatal hernia. 7. Left colonic diverticulosis.

## 2016-09-22 NOTE — Telephone Encounter (Signed)
Sharyne Richters spoke with patient. Pt asymptomatic

## 2016-09-27 ENCOUNTER — Encounter: Payer: Self-pay | Admitting: Internal Medicine

## 2016-09-27 ENCOUNTER — Encounter: Payer: Self-pay | Admitting: *Deleted

## 2016-09-27 ENCOUNTER — Inpatient Hospital Stay: Payer: Medicare Other | Attending: Internal Medicine

## 2016-09-27 ENCOUNTER — Inpatient Hospital Stay: Payer: Medicare Other

## 2016-09-27 ENCOUNTER — Inpatient Hospital Stay (HOSPITAL_BASED_OUTPATIENT_CLINIC_OR_DEPARTMENT_OTHER): Payer: Medicare Other | Admitting: Internal Medicine

## 2016-09-27 VITALS — BP 112/66 | HR 65 | Temp 95.2°F | Resp 18 | Wt 169.0 lb

## 2016-09-27 DIAGNOSIS — N200 Calculus of kidney: Secondary | ICD-10-CM

## 2016-09-27 DIAGNOSIS — C7951 Secondary malignant neoplasm of bone: Secondary | ICD-10-CM

## 2016-09-27 DIAGNOSIS — Z7952 Long term (current) use of systemic steroids: Secondary | ICD-10-CM

## 2016-09-27 DIAGNOSIS — C61 Malignant neoplasm of prostate: Secondary | ICD-10-CM

## 2016-09-27 DIAGNOSIS — Z79899 Other long term (current) drug therapy: Secondary | ICD-10-CM | POA: Insufficient documentation

## 2016-09-27 DIAGNOSIS — R591 Generalized enlarged lymph nodes: Secondary | ICD-10-CM

## 2016-09-27 DIAGNOSIS — Z006 Encounter for examination for normal comparison and control in clinical research program: Secondary | ICD-10-CM | POA: Insufficient documentation

## 2016-09-27 DIAGNOSIS — Z79818 Long term (current) use of other agents affecting estrogen receptors and estrogen levels: Secondary | ICD-10-CM | POA: Diagnosis not present

## 2016-09-27 LAB — CBC WITH DIFFERENTIAL/PLATELET
BASOS ABS: 0 10*3/uL (ref 0–0.1)
BASOS PCT: 1 %
EOS PCT: 1 %
Eosinophils Absolute: 0.1 10*3/uL (ref 0–0.7)
HEMATOCRIT: 42.2 % (ref 40.0–52.0)
HEMOGLOBIN: 14.6 g/dL (ref 13.0–18.0)
Lymphocytes Relative: 32 %
Lymphs Abs: 1.6 10*3/uL (ref 1.0–3.6)
MCH: 33.7 pg (ref 26.0–34.0)
MCHC: 34.7 g/dL (ref 32.0–36.0)
MCV: 97.2 fL (ref 80.0–100.0)
Monocytes Absolute: 0.5 10*3/uL (ref 0.2–1.0)
Monocytes Relative: 10 %
NEUTROS PCT: 56 %
Neutro Abs: 2.9 10*3/uL (ref 1.4–6.5)
Platelets: 214 10*3/uL (ref 150–440)
RBC: 4.34 MIL/uL — AB (ref 4.40–5.90)
RDW: 13.9 % (ref 11.5–14.5)
WBC: 5.1 10*3/uL (ref 3.8–10.6)

## 2016-09-27 LAB — COMPREHENSIVE METABOLIC PANEL
ALBUMIN: 4.4 g/dL (ref 3.5–5.0)
ALK PHOS: 49 U/L (ref 38–126)
ALT: 13 U/L — AB (ref 17–63)
AST: 18 U/L (ref 15–41)
Anion gap: 8 (ref 5–15)
BILIRUBIN TOTAL: 1.1 mg/dL (ref 0.3–1.2)
BUN: 20 mg/dL (ref 6–20)
CO2: 27 mmol/L (ref 22–32)
CREATININE: 0.98 mg/dL (ref 0.61–1.24)
Calcium: 9.4 mg/dL (ref 8.9–10.3)
Chloride: 103 mmol/L (ref 101–111)
GFR calc Af Amer: >60 mL/min (ref >60)
GFR calc non Af Amer: >60 mL/min (ref >60)
GLUCOSE: 101 mg/dL — AB (ref 65–99)
POTASSIUM: 4.3 mmol/L (ref 3.5–5.1)
Sodium: 138 mmol/L (ref 135–145)
TOTAL PROTEIN: 7.4 g/dL (ref 6.5–8.1)

## 2016-09-27 LAB — PSA: PSA: 12.23 ng/mL — AB (ref 0.00–4.00)

## 2016-09-27 MED ORDER — DENOSUMAB 120 MG/1.7ML ~~LOC~~ SOLN
120.0000 mg | Freq: Once | SUBCUTANEOUS | Status: AC
  Administered 2016-09-27: 120 mg via SUBCUTANEOUS
  Filled 2016-09-27: qty 1.7

## 2016-09-27 NOTE — Progress Notes (Signed)
Patient here today for follow up.  Patient states no new concerns today  

## 2016-09-27 NOTE — Progress Notes (Signed)
William Wright returns to clinic today for consideration of cycle 25 treatment on the Alliance [id number] Prostate study receiving daily Enzalutamide, Abiraterone and Prednisone. VS remain stable. CBC and chemistries all within acceptable parameters for treatment. PSA has not yet resulted.  Patient continues to report that has remained about the same intensity States he started taking Effexor after his last clinic visit and hot flashes have improved a little, but have not stopped. They do not limit his ADL's and are not as bothersome as they were previously. Per Dr. [name], it will take about 8 weeks for him to see much improvement in the hot flashes. Reviewed CT and bone scan results with patient and his questions concerning the scan results were answered. Dr. [name] confirmed that patient continues to have stable disease and we will proceed with Cycle 25 study treatment. [name] denies experiencing any pain anywhere today. He returned his medication calendars which verify that he is taking the enzalutamide, abiraterone and prednisone as prescribed. He also returned the completed PK questionnaire and his old bottle of enzalutamide with 8 capsules remaining, as expected. This was exchanged in the pharmacy and a new bottle of 120 capsules of enzalutamide was dispensed to patient. New medication calendars for cycle 25 were also given to Mr. Salvato. He will receive an Xgeva injection today and we will schedule his return study appointment on 10/25/16. Patient to call tomorrow for his PSA results. Adverse events and attributions were assessed as follows:   Adverse Event Log  Study/Protocol: Alliance [id number] Cycle: 24 adverse events  Event Grade Onset Date Resolved Date Drug Name Enzalutamide + Abiraterone Attribution Treatment Comments  Fatigue Grade 1 11/25/14   Probably    Hot Flashes  Grade 2 08/30/16 09/26/16  Possible  r/t HST Effexor Rx given 08/30/16  Hot Flashes Grade 1 09/27/16    Possible  Improved some with Effexor  [name], BSN, MHA, OCN 09/27/2016 11:10 AM

## 2016-09-27 NOTE — Assessment & Plan Note (Addendum)
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 Also on Zytiga plus Xtandi- as per clinical trial. March 2018 PSA 12. CT scan C/A/P- stable disease-lymphadenopathy in the pelvis and retroperitoneum; bone scan shows stable bone metastases.  # Patient tolerating treatment very well. Continue same treatments.  No obvious signs of clinical progression.  # Hot flashes- G-2-  On effexor; improved/ continue for now.   # Bone lesions continue Xgeva; no side effects noted. Continue calcium and vitamin D.  # Kidney stone partially obstructing-no symptoms. Follow-up with urology Dr. Rogers Blocker.   Follow up with me in 4 weeks/ labs/X-geva. Discussed with research RNs.  # I reviewed the blood work- with the patient in detail; also reviewed the imaging independently [as summarized above]; and reviewed the findings with the patient in detail.

## 2016-09-27 NOTE — Progress Notes (Signed)
[place] OFFICE PROGRESS NOTE  Patient Care Team: Provider Not In System as PCP - General Royston Cowper, MD (Urology)  Cancer of prostate [facility])   Staging form: Prostate, AJCC 7th Edition     Clinical: Stage IV (T2, M1) - Signed by Evlyn Kanner, NP on 01/07/2015    Oncology History   # FEB 2014- Prostate cancer Stage II [T2N0]; PA-18;   # May 2015-STAGE IV;  PSA 90 on ADT; CT/Bone scan-extensive mets;  casodex+ Trelstar  # June 2016- Castration resistant prostate cancer/[facility] protocol- ZYTIGA plus minus XTANDI; June 28th-CT-C/A/P- Stable Retrocrural/RP/Pelvic LN;Bone scan- Stable sclerotic lesions   # Xgeva     Cancer of prostate ([place])   08/17/2012 Initial Diagnosis    Cancer of prostate, stage II      12/03/2013 Progression         10/13/2014 Progression          Prostate cancer metastatic to multiple sites ([place])   01/19/2016 Initial Diagnosis    Prostate cancer metastatic to multiple sites [facility])        INTERVAL HISTORY:  William Wright 66 y.o.  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   He denies any back pain. Denies any nausea vomiting. Denies any chills. No fevers. Denies any unusual shortness of breath or cough. No chest pain or shortness of breath. No seizures. No weight loss. No bone pain.  Denies any jaw pain.  No swelling in the legs. No nausea or vomiting. He continues to have hot flashes question slight improvement on Effexor.  REVIEW OF SYSTEMS:  A complete 10 point review of system is done which is negative except mentioned above/history of present illness.   PAST MEDICAL HISTORY :  Past Medical History:  Diagnosis Date  . Cancer of prostate ([place]) 11/18/2014  . Prostate cancer ([place])     PAST SURGICAL HISTORY :   Past Surgical History:  Procedure  Laterality Date  . EXTRACORPOREAL SHOCK WAVE LITHOTRIPSY Left 05/20/2015   Procedure: EXTRACORPOREAL SHOCK WAVE LITHOTRIPSY (ESWL);  Surgeon: Royston Cowper, MD;  Location: [facility] ORS;  Service: Urology;  Laterality: Left;    FAMILY HISTORY :  No family history on file.  SOCIAL HISTORY:   Social History  Substance Use Topics  . Smoking status: Never Smoker  . Smokeless tobacco: Never Used  . Alcohol use No    ALLERGIES:  is allergic to no known allergies.  MEDICATIONS:  Current Outpatient Prescriptions  Medication Sig Dispense Refill  . abiraterone Acetate (ZYTIGA) 250 MG tablet Take 4 tablets (1,000 mg total) by mouth daily. Take on an empty stomach 1 hour before or 2 hours after a meal 120 tablet 4  . Investigational enzalutamide 40 MG capsule [facility] [id number] Take 4 capsules (160 mg total) by mouth daily. Take with or without food.Swallow whole. Do not crush, or open capsules. 120 capsule 0  . Investigational enzalutamide 40 MG capsule [facility]ANCE [id number] Take 4 capsules (160 mg total) by mouth daily. Take with or without food.Swallow whole. Do not crush, or open capsules. 120 capsule 0  . predniSONE (DELTASONE) 5 MG tablet Take 1 tablet (5 mg total) by mouth 2 (two) times daily. 60 tablet 0  . tamsulosin (FLOMAX) 0.4 MG CAPS capsule Take 1 capsule (0.4 mg total) by mouth daily. 30 capsule 11  .  Triptorelin Pamoate (TRELSTAR) 22.5 MG injection Inject 22.5 mg into the muscle every 6 (six) months.    . valACYclovir (VALTREX) 1000 MG tablet Take 1 tablet (1,000 mg total) by mouth 2 (two) times daily. 60 tablet 6  . venlafaxine XR (EFFEXOR-XR) 37.5 MG 24 hr capsule Take 1 capsule (37.5 mg total) by mouth daily with breakfast. 30 capsule 4   No current facility-administered medications for this visit.     PHYSICAL EXAMINATION: ECOG PERFORMANCE STATUS: 0 - Asymptomatic  BP 112/66 (BP Location: Left Arm, Patient Position: Sitting)   Pulse 65   Temp (!) 95.2 F (35.1 C) (Tympanic)   Resp  18   Wt 169 lb (76.7 kg)   BMI 24.25 kg/m   Filed Weights   09/27/16 1006  Weight: 169 lb (76.7 kg)    GENERAL: Well-nourished well-developed; Alert, no distress and comfortable. He is Alone. EYES: no pallor or icterus OROPHARYNX: no thrush or ulceration; good dentition  NECK: supple, no masses felt LYMPH:  no palpable lymphadenopathy in the cervical, axillary or inguinal regions LUNGS: clear to auscultation and  No wheeze or crackles HEART/CVS: regular rate & rhythm and no murmurs; No lower extremity edema ABDOMEN:abdomen soft, non-tender and normal bowel sounds Musculoskeletal:no cyanosis of digits and no clubbing  PSYCH: alert & oriented x 3 with fluent speech NEURO: no focal motor/sensory deficits SKIN:  no rashes or significant lesions  LABORATORY DATA:  I have reviewed the data as listed    Component Value Date/Time   NA 138 09/27/2016 0945   NA 137 11/12/2014 1415   K 4.3 09/27/2016 0945   K 4.1 11/12/2014 1415   CL 103 09/27/2016 0945   CL 105 11/12/2014 1415   CO2 27 09/27/2016 0945   CO2 24 11/12/2014 1415   GLUCOSE 101 (H) 09/27/2016 0945   GLUCOSE 98 11/12/2014 1415   BUN 20 09/27/2016 0945   BUN 24 (H) 11/12/2014 1415   CREATININE 0.98 09/27/2016 0945   CREATININE 1.02 11/12/2014 1415   CALCIUM 9.4 09/27/2016 0945   CALCIUM 9.2 11/12/2014 1415   PROT 7.4 09/27/2016 0945   PROT 7.4 11/12/2014 1415   ALBUMIN 4.4 09/27/2016 0945   ALBUMIN 4.4 11/12/2014 1415   AST 18 09/27/2016 0945   AST 23 11/12/2014 1415   ALT 13 (L) 09/27/2016 0945   ALT 16 (L) 11/12/2014 1415   ALKPHOS 49 09/27/2016 0945   ALKPHOS 103 11/12/2014 1415   BILITOT 1.1 09/27/2016 0945   BILITOT 0.4 11/12/2014 1415   GFRNONAA >60 09/27/2016 0945   GFRNONAA >60 11/12/2014 1415   GFRAA >60 09/27/2016 0945   GFRAA >60 11/12/2014 1415    No results found for: SPEP, UPEP  Lab Results  Component Value Date   WBC 5.1 09/27/2016   NEUTROABS 2.9 09/27/2016   HGB 14.6 09/27/2016    HCT 42.2 09/27/2016   MCV 97.2 09/27/2016   PLT 214 09/27/2016      Chemistry      Component Value Date/Time   NA 138 09/27/2016 0945   NA 137 11/12/2014 1415   K 4.3 09/27/2016 0945   K 4.1 11/12/2014 1415   CL 103 09/27/2016 0945   CL 105 11/12/2014 1415   CO2 27 09/27/2016 0945   CO2 24 11/12/2014 1415   BUN 20 09/27/2016 0945   BUN 24 (H) 11/12/2014 1415   CREATININE 0.98 09/27/2016 0945   CREATININE 1.02 11/12/2014 1415      Component Value Date/Time   CALCIUM 9.4  09/27/2016 0945   CALCIUM 9.2 11/12/2014 1415   ALKPHOS 49 09/27/2016 0945   ALKPHOS 103 11/12/2014 1415   AST 18 09/27/2016 0945   AST 23 11/12/2014 1415   ALT 13 (L) 09/27/2016 0945   ALT 16 (L) 11/12/2014 1415   BILITOT 1.1 09/27/2016 0945   BILITOT 0.4 11/12/2014 1415     Results for Hamler, PHILLIP L (MRN [id number]) as of 08/30/2016 10:52  Ref. Range 04/12/2016 09:20 05/10/2016 08:46 06/07/2016 09:51 07/05/2016 09:27 08/02/2016 10:25  PSA Latest Ref Range: 0.00 - 4.00 ng/mL 16.38 (H) 17.38 (H) 13.84 (H) 14.22 (H) 15.38 (H)      RADIOGRAPHIC STUDIES: I have personally reviewed the radiological images as listed and agreed with the findings in the report. No results found.   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 Also on Zytiga plus Xtandi- as per clinical trial. March 2018 PSA 12. CT scan C/A/P- stable disease-lymphadenopathy in the pelvis and retroperitoneum; bone scan shows stable bone metastases.  # Patient tolerating treatment very well. Continue same treatments.  No obvious signs of clinical progression.  # Hot flashes- G-2-  On effexor; improved/ continue for now.   # Bone lesions continue Xgeva; no side effects noted. Continue calcium and vitamin D.  # Kidney stone partially obstructing-no symptoms. Follow-up with urology Dr. Rogers Blocker.   Follow up  with me in 4 weeks/ labs/X-geva. Discussed with research RNs.  # I reviewed the blood work- with the patient in detail; also reviewed the imaging independently [as summarized above]; and reviewed the findings with the patient in detail.     Orders Placed This Encounter  Procedures  . CBC with Differential/Platelet    Standing Status:   Future    Standing Expiration Date:   12/28/2016  . Comprehensive metabolic panel    Standing Status:   Future    Standing Expiration Date:   12/28/2016  . PSA    Standing Status:   Future    Standing Expiration Date:   12/28/2016   All questions were answered. The patient knows to call the clinic with any problems, questions or concerns.      Cammie Sickle, MD 09/27/2016 1:00 PM

## 2016-09-28 DIAGNOSIS — N132 Hydronephrosis with renal and ureteral calculous obstruction: Secondary | ICD-10-CM | POA: Diagnosis not present

## 2016-09-28 DIAGNOSIS — C61 Malignant neoplasm of prostate: Secondary | ICD-10-CM | POA: Diagnosis not present

## 2016-09-28 DIAGNOSIS — N201 Calculus of ureter: Secondary | ICD-10-CM | POA: Diagnosis not present

## 2016-10-02 ENCOUNTER — Telehealth: Payer: Self-pay | Admitting: *Deleted

## 2016-10-02 NOTE — Telephone Encounter (Signed)
T/C made back to Marga Melnick in response to a question he and last week. He had seen Dr. Yves Dill for his kidney stone and told him about his hot flashes. Dr. Yves Dill asked him why Dr. [name] did not prescribe Megace for his hot flashes instead of Effexor. Patient stated that per Dr. Yves Dill, the Megace helps with hot flashes and kills cancer cells. Dr. [name] informed of this question and states he does not use Megace for this reason, but will use it to stimulate the appetite if needed. Patient informed of Dr. Aletha Halim response and states he will take the response back to Dr. Yves Dill at his appointment this week.  [name], BSN, MHA, OCN 10/02/2016  2:18 PM

## 2016-10-05 DIAGNOSIS — C61 Malignant neoplasm of prostate: Secondary | ICD-10-CM | POA: Diagnosis not present

## 2016-10-05 DIAGNOSIS — N132 Hydronephrosis with renal and ureteral calculous obstruction: Secondary | ICD-10-CM | POA: Diagnosis not present

## 2016-10-05 DIAGNOSIS — N201 Calculus of ureter: Secondary | ICD-10-CM | POA: Diagnosis not present

## 2016-10-25 ENCOUNTER — Inpatient Hospital Stay: Payer: Medicare Other | Attending: Internal Medicine | Admitting: Internal Medicine

## 2016-10-25 ENCOUNTER — Encounter: Payer: Self-pay | Admitting: *Deleted

## 2016-10-25 ENCOUNTER — Inpatient Hospital Stay: Payer: Medicare Other

## 2016-10-25 ENCOUNTER — Telehealth: Payer: Self-pay | Admitting: *Deleted

## 2016-10-25 VITALS — BP 107/74 | HR 80 | Temp 97.2°F | Resp 18 | Wt 169.5 lb

## 2016-10-25 DIAGNOSIS — Z79899 Other long term (current) drug therapy: Secondary | ICD-10-CM | POA: Diagnosis not present

## 2016-10-25 DIAGNOSIS — Z5111 Encounter for antineoplastic chemotherapy: Secondary | ICD-10-CM | POA: Insufficient documentation

## 2016-10-25 DIAGNOSIS — C61 Malignant neoplasm of prostate: Secondary | ICD-10-CM | POA: Insufficient documentation

## 2016-10-25 DIAGNOSIS — Z79810 Long term (current) use of selective estrogen receptor modulators (SERMs): Secondary | ICD-10-CM | POA: Insufficient documentation

## 2016-10-25 DIAGNOSIS — Z006 Encounter for examination for normal comparison and control in clinical research program: Secondary | ICD-10-CM | POA: Diagnosis not present

## 2016-10-25 DIAGNOSIS — C7951 Secondary malignant neoplasm of bone: Secondary | ICD-10-CM | POA: Insufficient documentation

## 2016-10-25 DIAGNOSIS — Z7982 Long term (current) use of aspirin: Secondary | ICD-10-CM

## 2016-10-25 DIAGNOSIS — Z7952 Long term (current) use of systemic steroids: Secondary | ICD-10-CM | POA: Insufficient documentation

## 2016-10-25 DIAGNOSIS — N200 Calculus of kidney: Secondary | ICD-10-CM | POA: Diagnosis not present

## 2016-10-25 LAB — CBC WITH DIFFERENTIAL/PLATELET
Basophils Absolute: 0 10*3/uL (ref 0–0.1)
Basophils Relative: 1 %
EOS PCT: 1 %
Eosinophils Absolute: 0.1 10*3/uL (ref 0–0.7)
HEMATOCRIT: 41.1 % (ref 40.0–52.0)
Hemoglobin: 14.5 g/dL (ref 13.0–18.0)
Lymphocytes Relative: 32 %
Lymphs Abs: 1.6 10*3/uL (ref 1.0–3.6)
MCH: 33.8 pg (ref 26.0–34.0)
MCHC: 35.2 g/dL (ref 32.0–36.0)
MCV: 96 fL (ref 80.0–100.0)
MONO ABS: 0.5 10*3/uL (ref 0.2–1.0)
MONOS PCT: 11 %
Neutro Abs: 2.7 10*3/uL (ref 1.4–6.5)
Neutrophils Relative %: 55 %
Platelets: 216 10*3/uL (ref 150–440)
RBC: 4.29 MIL/uL — ABNORMAL LOW (ref 4.40–5.90)
RDW: 13.8 % (ref 11.5–14.5)
WBC: 4.8 10*3/uL (ref 3.8–10.6)

## 2016-10-25 LAB — COMPREHENSIVE METABOLIC PANEL
ALT: 12 U/L — ABNORMAL LOW (ref 17–63)
ANION GAP: 6 (ref 5–15)
AST: 17 U/L (ref 15–41)
Albumin: 4.2 g/dL (ref 3.5–5.0)
Alkaline Phosphatase: 49 U/L (ref 38–126)
BILIRUBIN TOTAL: 0.9 mg/dL (ref 0.3–1.2)
BUN: 18 mg/dL (ref 6–20)
CO2: 26 mmol/L (ref 22–32)
Calcium: 9.4 mg/dL (ref 8.9–10.3)
Chloride: 106 mmol/L (ref 101–111)
Creatinine, Ser: 0.89 mg/dL (ref 0.61–1.24)
GFR calc Af Amer: >60 mL/min (ref >60)
Glucose, Bld: 94 mg/dL (ref 65–99)
POTASSIUM: 4.1 mmol/L (ref 3.5–5.1)
Sodium: 138 mmol/L (ref 135–145)
TOTAL PROTEIN: 7.3 g/dL (ref 6.5–8.1)

## 2016-10-25 LAB — PSA: PSA: 9.49 ng/mL — AB (ref 0.00–4.00)

## 2016-10-25 MED ORDER — DENOSUMAB 120 MG/1.7ML ~~LOC~~ SOLN
120.0000 mg | Freq: Once | SUBCUTANEOUS | Status: AC
  Administered 2016-10-25: 120 mg via SUBCUTANEOUS
  Filled 2016-10-25: qty 1.7

## 2016-10-25 MED ORDER — INV-ENZALUTAMIDE 40 MG CAPS #120 ALLIANCE A031201: 160.0000 mg | ORAL_CAPSULE | Freq: Every day | ORAL | 0 refills | Status: DC

## 2016-10-25 NOTE — Progress Notes (Signed)
William Wright returns to clinic today for consideration of cycle 26 treatment on the [facility] [id number] Prostate study receiving daily Enzalutamide, Abiraterone and Prednisone. VS remain stable. CBC and chemistries all within acceptable parameters for treatment. PSA has not yet resulted.  Patient continues to report that he is experiencing fatigue at about the same intensity. States he stopped taking Effexor a few days ago and began taking Megestrol; which was prescribed for him by his urologist. He reports his flashes have improved since starting the Megace, but have not stopped. He denies the hot flashes limit his ADL's but they do affect his sleep quality at times. Dr. [name] did not object to patient taking Megace; however since it is a hormone therapy, it is prohibited by the protocol, and William Wright was informed that he will have to stop taking the Megace if he wishes to remain on this clinical trial. Patient states he does not want to withdraw from the study and will stop taking the medication as instructed. He returned his medication calendars which verify that he is taking the enzalutamide, abiraterone and prednisone as prescribed. He also returned the completed PK questionnaire and his old bottle of enzalutamide with 8 capsules remaining, as expected. This was exchanged in the pharmacy and a new bottle of 120 capsules of enzalutamide 40mg  was dispensed to patient. New medication calendars for cycle 25 were also given to William Wright. He reports that he is receiving his Abiraterone 250mg  capsules, #120 monthly from [facility], and he receives his prednisone prescription through a different mail order pharmacy in three month supplies. He will receive an Xgeva injection today and we will schedule his return study appointment on 11/23/16. Patient to call tomorrow for his PSA results. Adverse events and attributions were assessed as follows:   Adverse Event Log  Study/Protocol: [facility]  [id number] Cycle: 25 adverse events  Event Grade Onset Date Resolved Date Drug Name Enzalutamide + Abiraterone Attribution Treatment Comments  Fatigue Grade 1 11/25/14   Probably    Hot Flashes Grade 1 09/27/16   Possible  Improved some with Effexor  Renal Calculi Grade 2 09/22/16   Unrelated  Referred to urologist  [name], BSN, MHA, OCN 10/25/2016 10:10 AM

## 2016-10-25 NOTE — Progress Notes (Signed)
[place] OFFICE PROGRESS NOTE  Patient Care Team: Provider Not In System as PCP - General Royston Cowper, MD (Urology)  Cancer of prostate [facility])   Staging form: Prostate, AJCC 7th Edition     Clinical: Stage IV (T2, M1) - Signed by Evlyn Kanner, NP on 01/07/2015    Oncology History   # FEB 2014- Prostate cancer Stage II [T2N0]; PA-18;   # May 2015-STAGE IV;  PSA 90 on ADT; CT/Bone scan-extensive mets;  casodex+ Trelstar  # June 2016- Castration resistant prostate cancer/[facility] protocol- ZYTIGA plus minus XTANDI; June 28th-CT-C/A/P- Stable Retrocrural/RP/Pelvic LN;Bone scan- Stable sclerotic lesions   # Xgeva     Cancer of prostate ([place])   08/17/2012 Initial Diagnosis    Cancer of prostate, stage II      12/03/2013 Progression         10/13/2014 Progression          Prostate cancer metastatic to multiple sites ([place])   01/19/2016 Initial Diagnosis    Prostate cancer metastatic to multiple sites [facility])        INTERVAL HISTORY:  William Wright 66 y.o.  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  In the interim patient noted to have again pain of his right flank with hematuria [history of kidney stones]; symptoms resolved. He was again evaluated by Dr. Yves Dill urology. He was also prescribed Megace for his hot flashes.  Patient notes to have improvement of his hot flashes. Otherwise his back pain is resolved. Normal blood in urine.  Denies any unusual shortness of breath or cough. No chest pain or shortness of breath. No seizures. No weight loss. No bone pain.  Denies any jaw pain.  No swelling in the legs. No nausea or vomiting.  REVIEW OF SYSTEMS:  A complete 10 point review of system is done which is negative except mentioned above/history of present illness.   PAST MEDICAL HISTORY :  Past Medical History:  Diagnosis Date  . Cancer  of prostate ([place]) 11/18/2014  . Prostate cancer ([place])     PAST SURGICAL HISTORY :   Past Surgical History:  Procedure Laterality Date  . EXTRACORPOREAL SHOCK WAVE LITHOTRIPSY Left 05/20/2015   Procedure: EXTRACORPOREAL SHOCK WAVE LITHOTRIPSY (ESWL);  Surgeon: Royston Cowper, MD;  Location: [facility] ORS;  Service: Urology;  Laterality: Left;    FAMILY HISTORY :  No family history on file.  SOCIAL HISTORY:   Social History  Substance Use Topics  . Smoking status: Never Smoker  . Smokeless tobacco: Never Used  . Alcohol use No    ALLERGIES:  is allergic to no known allergies.  MEDICATIONS:  Current Outpatient Prescriptions  Medication Sig Dispense Refill  . abiraterone Acetate (ZYTIGA) 250 MG tablet Take 4 tablets (1,000 mg total) by mouth daily. Take on an empty stomach 1 hour before or 2 hours after a meal 120 tablet 4  . Investigational enzalutamide 40 MG capsule [facility] [id number] Take 4 capsules (160 mg total) by mouth daily. Take with or without food.Swallow whole. Do not crush, or open capsules. 120 capsule 0  . Investigational enzalutamide 40 MG capsule [facility] [id number] Take 4 capsules (160 mg total) by mouth daily. Take with or without food.Swallow whole. Do not crush, or open capsules. 120 capsule 0  . megestrol (MEGACE) 20 MG tablet Take 20 mg by mouth 2 (two) times daily.    . predniSONE (  DELTASONE) 5 MG tablet Take 1 tablet (5 mg total) by mouth 2 (two) times daily. 60 tablet 0  . tamsulosin (FLOMAX) 0.4 MG CAPS capsule Take 1 capsule (0.4 mg total) by mouth daily. 30 capsule 11  . Triptorelin Pamoate (TRELSTAR) 22.5 MG injection Inject 22.5 mg into the muscle every 6 (six) months.    . valACYclovir (VALTREX) 1000 MG tablet Take 1 tablet (1,000 mg total) by mouth 2 (two) times daily. 60 tablet 6  . venlafaxine XR (EFFEXOR-XR) 37.5 MG 24 hr capsule Take 1 capsule (37.5 mg total) by mouth daily with breakfast. (Patient not taking: Reported on 10/25/2016) 30 capsule 4   No current  facility-administered medications for this visit.     PHYSICAL EXAMINATION: ECOG PERFORMANCE STATUS: 0 - Asymptomatic  There were no vitals taken for this visit.  There were no vitals filed for this visit.  GENERAL: Well-nourished well-developed; Alert, no distress and comfortable. He is Alone. EYES: no pallor or icterus OROPHARYNX: no thrush or ulceration; good dentition  NECK: supple, no masses felt LYMPH:  no palpable lymphadenopathy in the cervical, axillary or inguinal regions LUNGS: clear to auscultation and  No wheeze or crackles HEART/CVS: regular rate & rhythm and no murmurs; No lower extremity edema ABDOMEN:abdomen soft, non-tender and normal bowel sounds Musculoskeletal:no cyanosis of digits and no clubbing  PSYCH: alert & oriented x 3 with fluent speech NEURO: no focal motor/sensory deficits SKIN:  no rashes or significant lesions  LABORATORY DATA:  I have reviewed the data as listed    Component Value Date/Time   NA 138 10/25/2016 0904   NA 137 11/12/2014 1415   K 4.1 10/25/2016 0904   K 4.1 11/12/2014 1415   CL 106 10/25/2016 0904   CL 105 11/12/2014 1415   CO2 26 10/25/2016 0904   CO2 24 11/12/2014 1415   GLUCOSE 94 10/25/2016 0904   GLUCOSE 98 11/12/2014 1415   BUN 18 10/25/2016 0904   BUN 24 (H) 11/12/2014 1415   CREATININE 0.89 10/25/2016 0904   CREATININE 1.02 11/12/2014 1415   CALCIUM 9.4 10/25/2016 0904   CALCIUM 9.2 11/12/2014 1415   PROT 7.3 10/25/2016 0904   PROT 7.4 11/12/2014 1415   ALBUMIN 4.2 10/25/2016 0904   ALBUMIN 4.4 11/12/2014 1415   AST 17 10/25/2016 0904   AST 23 11/12/2014 1415   ALT 12 (L) 10/25/2016 0904   ALT 16 (L) 11/12/2014 1415   ALKPHOS 49 10/25/2016 0904   ALKPHOS 103 11/12/2014 1415   BILITOT 0.9 10/25/2016 0904   BILITOT 0.4 11/12/2014 1415   GFRNONAA >60 10/25/2016 0904   GFRNONAA >60 11/12/2014 1415   GFRAA >60 10/25/2016 0904   GFRAA >60 11/12/2014 1415    No results found for: SPEP, UPEP  Lab Results   Component Value Date   WBC 4.8 10/25/2016   NEUTROABS 2.7 10/25/2016   HGB 14.5 10/25/2016   HCT 41.1 10/25/2016   MCV 96.0 10/25/2016   PLT 216 10/25/2016      Chemistry      Component Value Date/Time   NA 138 10/25/2016 0904   NA 137 11/12/2014 1415   K 4.1 10/25/2016 0904   K 4.1 11/12/2014 1415   CL 106 10/25/2016 0904   CL 105 11/12/2014 1415   CO2 26 10/25/2016 0904   CO2 24 11/12/2014 1415   BUN 18 10/25/2016 0904   BUN 24 (H) 11/12/2014 1415   CREATININE 0.89 10/25/2016 0904   CREATININE 1.02 11/12/2014 1415  Component Value Date/Time   CALCIUM 9.4 10/25/2016 0904   CALCIUM 9.2 11/12/2014 1415   ALKPHOS 49 10/25/2016 0904   ALKPHOS 103 11/12/2014 1415   AST 17 10/25/2016 0904   AST 23 11/12/2014 1415   ALT 12 (L) 10/25/2016 0904   ALT 16 (L) 11/12/2014 1415   BILITOT 0.9 10/25/2016 0904   BILITOT 0.4 11/12/2014 1415     Results for EVERARDO, VORIS (MRN [id number]) as of 08/30/2016 10:52  Ref. Range 04/12/2016 09:20 05/10/2016 08:46 06/07/2016 09:51 07/05/2016 09:27 08/02/2016 10:25  PSA Latest Ref Range: 0.00 - 4.00 ng/mL 16.38 (H) 17.38 (H) 13.84 (H) 14.22 (H) 15.38 (H)      RADIOGRAPHIC STUDIES: I have personally reviewed the radiological images as listed and agreed with the findings in the report. No results found.   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 Also on Zytiga plus Xtandi- as per clinical trial. March 2018 PSA 12. CT scan C/A/P- stable disease-lymphadenopathy in the pelvis and retroperitoneum; bone scan shows stable bone metastases.  # Patient tolerating treatment very well. Continue same treatments.  No obvious signs of clinical progression.  # Hot flashes- G-1-  On effexor/megace- improved.   # Bone lesions continue Xgeva; no side effects noted. Continue calcium and vitamin D.  # Kidney  stone partially obstructing-no pain; blood in urine. Prn Nucenta. Follow-up with urology Dr. Rogers Blocker.   Follow up with me in 4 weeks/ labs/X-geva. Discussed with research RNs; will order CT scans at next visit.   No orders of the defined types were placed in this encounter.  All questions were answered. The patient knows to call the clinic with any problems, questions or concerns.      Cammie Sickle, MD 10/25/2016 9:48 AM

## 2016-10-25 NOTE — Progress Notes (Signed)
Patient here today for follow up.   

## 2016-10-25 NOTE — Assessment & Plan Note (Addendum)
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 Also on Zytiga plus Xtandi- as per clinical trial. March 2018 PSA 12. CT scan C/A/P- stable disease-lymphadenopathy in the pelvis and retroperitoneum; bone scan shows stable bone metastases.  # Patient tolerating treatment very well- except for hot flashes [C discussion below]. Continue same treatments.  No obvious signs of clinical progression.  # Hot flashes- G-1-  On effexor/megace- improved. However Megace not to be used in the clinical study. Patient will be instructed to stop using Megace.  # Bone lesions continue Xgeva; no side effects noted. Continue calcium and vitamin D.  # Kidney stone partially obstructing-no pain; blood in urine. Prn Nucenta. Follow-up with urology Dr. Rogers Blocker.   Follow up with me in 4 weeks/ labs/X-geva. Discussed with research RNs; will order CT scans at next visit.

## 2016-10-26 NOTE — Telephone Encounter (Signed)
Phone call made to Dr. Maryan Puls to inform that William Wright has stopped taking the Megace he had prescribed because it is prohibited by the protocol. Dr. Yves Dill expressed concern that the patient is having "severe hot flashes" and that he is miserable. He questions whether we have exercised our responsibility to provide informed consent and offered the patient any new treatment for his cancer other than the clinical trial. Informed Dr. Yves Dill that the patient was given the option to stop participating in the clinical trial if he wanted to continue to take Megace. Dr. Yves Dill questioned whether we had offered the patient a newly approved drug for prostate cancer called "Erleada" and I informed him that we had not offered this. Following this phone conversation, the drug Erleada or apalutamide was researched and the indication is actually for non-metastatic prostate cancer, therefore William Wright is not eligible for this new drug. Dr. [name] was informed of the conversation with Dr. Yves Dill and agrees that the patient is not eligible for the new drug Erleada, and states that William Wright has responded very well to the study drug regimen, his disease remains stable, and he feels this is still the best treatment for the patient. William Wright was also called and informed of the conversations with both Dr. Yves Dill and Dr. [name]. He was reminded that he is free to withdraw from the study at any time and for any reason. William Wright states he does not want to withdraw from the study and feels he is doing very well on his current chemotherapy regimen. Questioned William Wright about his hot flashes again, and he reports they are bad, but states he can live with them and that they do not affect his quality of life. He is to call me tomorrow to get the results of his PSA. [name], BSN, MHA, OCN 10/25/2016  4:21 PM

## 2016-11-23 ENCOUNTER — Telehealth: Payer: Self-pay | Admitting: Pharmacist

## 2016-11-23 ENCOUNTER — Inpatient Hospital Stay: Payer: Medicare Other | Attending: Internal Medicine

## 2016-11-23 ENCOUNTER — Encounter: Payer: Self-pay | Admitting: *Deleted

## 2016-11-23 ENCOUNTER — Inpatient Hospital Stay: Payer: Medicare Other

## 2016-11-23 ENCOUNTER — Inpatient Hospital Stay (HOSPITAL_BASED_OUTPATIENT_CLINIC_OR_DEPARTMENT_OTHER): Payer: Medicare Other | Admitting: Internal Medicine

## 2016-11-23 DIAGNOSIS — C7951 Secondary malignant neoplasm of bone: Secondary | ICD-10-CM | POA: Insufficient documentation

## 2016-11-23 DIAGNOSIS — Z79899 Other long term (current) drug therapy: Secondary | ICD-10-CM | POA: Insufficient documentation

## 2016-11-23 DIAGNOSIS — C61 Malignant neoplasm of prostate: Secondary | ICD-10-CM

## 2016-11-23 DIAGNOSIS — C775 Secondary and unspecified malignant neoplasm of intrapelvic lymph nodes: Secondary | ICD-10-CM

## 2016-11-23 DIAGNOSIS — Z79818 Long term (current) use of other agents affecting estrogen receptors and estrogen levels: Secondary | ICD-10-CM | POA: Insufficient documentation

## 2016-11-23 DIAGNOSIS — Z006 Encounter for examination for normal comparison and control in clinical research program: Secondary | ICD-10-CM

## 2016-11-23 DIAGNOSIS — Z7952 Long term (current) use of systemic steroids: Secondary | ICD-10-CM

## 2016-11-23 LAB — CBC WITH DIFFERENTIAL/PLATELET
BASOS ABS: 0 10*3/uL (ref 0–0.1)
Basophils Relative: 1 %
EOS PCT: 4 %
Eosinophils Absolute: 0.1 10*3/uL (ref 0–0.7)
HEMATOCRIT: 41.2 % (ref 40.0–52.0)
HEMOGLOBIN: 14.4 g/dL (ref 13.0–18.0)
LYMPHS ABS: 1.5 10*3/uL (ref 1.0–3.6)
LYMPHS PCT: 43 %
MCH: 34 pg (ref 26.0–34.0)
MCHC: 34.9 g/dL (ref 32.0–36.0)
MCV: 97.3 fL (ref 80.0–100.0)
Monocytes Absolute: 0.5 10*3/uL (ref 0.2–1.0)
Monocytes Relative: 15 %
NEUTROS ABS: 1.3 10*3/uL — AB (ref 1.4–6.5)
NEUTROS PCT: 37 %
PLATELETS: 208 10*3/uL (ref 150–440)
RBC: 4.23 MIL/uL — AB (ref 4.40–5.90)
RDW: 13.9 % (ref 11.5–14.5)
WBC: 3.5 10*3/uL — AB (ref 3.8–10.6)

## 2016-11-23 LAB — COMPREHENSIVE METABOLIC PANEL
ALK PHOS: 52 U/L (ref 38–126)
ALT: 12 U/L — AB (ref 17–63)
AST: 17 U/L (ref 15–41)
Albumin: 4.2 g/dL (ref 3.5–5.0)
Anion gap: 6 (ref 5–15)
BUN: 22 mg/dL — AB (ref 6–20)
CALCIUM: 9.3 mg/dL (ref 8.9–10.3)
CHLORIDE: 104 mmol/L (ref 101–111)
CO2: 28 mmol/L (ref 22–32)
CREATININE: 0.89 mg/dL (ref 0.61–1.24)
GFR calc Af Amer: >60 mL/min (ref >60)
Glucose, Bld: 80 mg/dL (ref 65–99)
Potassium: 3.9 mmol/L (ref 3.5–5.1)
Sodium: 138 mmol/L (ref 135–145)
Total Bilirubin: 1 mg/dL (ref 0.3–1.2)
Total Protein: 7.1 g/dL (ref 6.5–8.1)

## 2016-11-23 LAB — PSA: PSA: 9.29 ng/mL — AB (ref 0.00–4.00)

## 2016-11-23 MED ORDER — DENOSUMAB 120 MG/1.7ML ~~LOC~~ SOLN
120.0000 mg | Freq: Once | SUBCUTANEOUS | Status: AC
  Administered 2016-11-23: 120 mg via SUBCUTANEOUS
  Filled 2016-11-23: qty 1.7

## 2016-11-23 MED ORDER — INV-ENZALUTAMIDE 40 MG CAPS #120 ALLIANCE A031201: 160.0000 mg | ORAL_CAPSULE | Freq: Every day | ORAL | 0 refills | Status: DC

## 2016-11-23 NOTE — Progress Notes (Signed)
Patient here today for follow up.  Patient states no new concerns today  

## 2016-11-23 NOTE — Assessment & Plan Note (Addendum)
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 Also on Zytiga plus Xtandi- as per clinical trial. March 2018 PSA 12. CT scan C/A/P- stable disease-lymphadenopathy in the pelvis and retroperitoneum; bone scan shows stable bone metastases.  # Patient tolerating treatment very well- except for hot flashes/ ANC- 1.3 [C discussion below]. Continue same treatments.  No obvious signs of clinical progression.  # [place]- 1300; ? Viral infection; monitor for now. Unlikely secondary to medications.  # Hot flashes- G-1-  On Effexor improved.  # Bone lesions continue Xgeva; no side effects noted. Continue calcium and vitamin D.  Follow up with me in 4 weeks/ labs/X-geva/ CT-bone scans prior- covering MD; follow up with me in 2 months/labs.

## 2016-11-23 NOTE — Progress Notes (Signed)
[place] OFFICE PROGRESS NOTE  Patient Care Team: System, Provider Not In as PCP - General Royston Cowper, MD (Urology)  Cancer of prostate [facility])   Staging form: Prostate, AJCC 7th Edition     Clinical: Stage IV (T2, M1) - Signed by Evlyn Kanner, NP on 01/07/2015    Oncology History   # FEB 2014- Prostate cancer Stage II [T2N0]; PA-18;   # May 2015-STAGE IV;  PSA 90 on ADT; CT/Bone scan-extensive mets;  casodex+ Trelstar  # June 2016- Castration resistant prostate cancer/[facility] protocol- ZYTIGA plus minus XTANDI; June 28th-CT-C/A/P- Stable Retrocrural/RP/Pelvic LN;Bone scan- Stable sclerotic lesions   # Xgeva     Cancer of prostate ([place])   08/17/2012 Initial Diagnosis    Cancer of prostate, stage II      12/03/2013 Progression         10/13/2014 Progression          Prostate cancer metastatic to multiple sites ([place])   01/19/2016 Initial Diagnosis    Prostate cancer metastatic to multiple sites [facility])        INTERVAL HISTORY:  William Wright 66 y.o.  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  Patient notes to have improvement of his hot flashes. Continues to be on Effexor. Megace was discontinued because of protocol violation. Denies any unusual shortness of breath or cough. No chest pain or shortness of breath. No seizures. No weight loss. No bone pain.  Denies any jaw pain.  No swelling in the legs. No nausea or vomiting.  REVIEW OF SYSTEMS:  A complete 10 point review of system is done which is negative except mentioned above/history of present illness.   PAST MEDICAL HISTORY :  Past Medical History:  Diagnosis Date  . Cancer of prostate ([place]) 11/18/2014  . Prostate cancer ([place])     PAST SURGICAL HISTORY :   Past Surgical History:  Procedure Laterality Date  . EXTRACORPOREAL SHOCK WAVE LITHOTRIPSY Left 05/20/2015    Procedure: EXTRACORPOREAL SHOCK WAVE LITHOTRIPSY (ESWL);  Surgeon: Royston Cowper, MD;  Location: [facility] ORS;  Service: Urology;  Laterality: Left;    FAMILY HISTORY :  No family history on file.  SOCIAL HISTORY:   Social History  Substance Use Topics  . Smoking status: Never Smoker  . Smokeless tobacco: Never Used  . Alcohol use No    ALLERGIES:  is allergic to no known allergies.  MEDICATIONS:  Current Outpatient Prescriptions  Medication Sig Dispense Refill  . abiraterone Acetate (ZYTIGA) 250 MG tablet Take 4 tablets (1,000 mg total) by mouth daily. Take on an empty stomach 1 hour before or 2 hours after a meal 120 tablet 4  . Investigational enzalutamide 40 MG capsule [facility] [id number] Take 4 capsules (160 mg total) by mouth daily. Take with or without food.Swallow whole. Do not crush, or open capsules. 120 capsule 0  . Investigational enzalutamide 40 MG capsule [facility] [id number] Take 4 capsules (160 mg total) by mouth daily. Take with or without food.Swallow whole. Do not crush, or open capsules. 120 capsule 0  . Investigational enzalutamide 40 MG capsule [facility] [id number] Take 4 capsules (160 mg total) by mouth daily. Take with or without food.Swallow whole. Do not crush, or open capsules. 120 capsule 0  . predniSONE (DELTASONE) 5 MG tablet Take 1 tablet (5 mg total) by mouth 2 (two) times daily. 60 tablet 0  . tamsulosin (FLOMAX)  0.4 MG CAPS capsule Take 1 capsule (0.4 mg total) by mouth daily. 30 capsule 11  . Triptorelin Pamoate (TRELSTAR) 22.5 MG injection Inject 22.5 mg into the muscle every 6 (six) months.    . venlafaxine XR (EFFEXOR-XR) 37.5 MG 24 hr capsule Take 1 capsule (37.5 mg total) by mouth daily with breakfast. 30 capsule 4  . Investigational enzalutamide 40 MG capsule [facility]ANCE [id number] Take 4 capsules (160 mg total) by mouth daily. Take with or without food.Swallow whole. Do not crush, or open capsules. 120 capsule 0  . valACYclovir (VALTREX) 1000 MG tablet Take 1 tablet  (1,000 mg total) by mouth 2 (two) times daily. (Patient not taking: Reported on 11/23/2016) 60 tablet 6   No current facility-administered medications for this visit.     PHYSICAL EXAMINATION: ECOG PERFORMANCE STATUS: 0 - Asymptomatic  BP 138/67 (BP Location: Left Arm, Patient Position: Sitting)   Pulse 60   Temp (!) 95.8 F (35.4 C) (Tympanic)   Resp 18   Wt 171 lb (77.6 kg)   BMI 24.54 kg/m   Filed Weights   11/23/16 0953  Weight: 171 lb (77.6 kg)    GENERAL: Well-nourished well-developed; Alert, no distress and comfortable. He is Alone. EYES: no pallor or icterus OROPHARYNX: no thrush or ulceration; good dentition  NECK: supple, no masses felt LYMPH:  no palpable lymphadenopathy in the cervical, axillary or inguinal regions LUNGS: clear to auscultation and  No wheeze or crackles HEART/CVS: regular rate & rhythm and no murmurs; No lower extremity edema ABDOMEN:abdomen soft, non-tender and normal bowel sounds Musculoskeletal:no cyanosis of digits and no clubbing  PSYCH: alert & oriented x 3 with fluent speech NEURO: no focal motor/sensory deficits SKIN:  no rashes or significant lesions  LABORATORY DATA:  I have reviewed the data as listed    Component Value Date/Time   NA 138 11/23/2016 0928   NA 137 11/12/2014 1415   K 3.9 11/23/2016 0928   K 4.1 11/12/2014 1415   CL 104 11/23/2016 0928   CL 105 11/12/2014 1415   CO2 28 11/23/2016 0928   CO2 24 11/12/2014 1415   GLUCOSE 80 11/23/2016 0928   GLUCOSE 98 11/12/2014 1415   BUN 22 (H) 11/23/2016 0928   BUN 24 (H) 11/12/2014 1415   CREATININE 0.89 11/23/2016 0928   CREATININE 1.02 11/12/2014 1415   CALCIUM 9.3 11/23/2016 0928   CALCIUM 9.2 11/12/2014 1415   PROT 7.1 11/23/2016 0928   PROT 7.4 11/12/2014 1415   ALBUMIN 4.2 11/23/2016 0928   ALBUMIN 4.4 11/12/2014 1415   AST 17 11/23/2016 0928   AST 23 11/12/2014 1415   ALT 12 (Wright) 11/23/2016 0928   ALT 16 (Wright) 11/12/2014 1415   ALKPHOS 52 11/23/2016 0928    ALKPHOS 103 11/12/2014 1415   BILITOT 1.0 11/23/2016 0928   BILITOT 0.4 11/12/2014 1415   GFRNONAA >60 11/23/2016 0928   GFRNONAA >60 11/12/2014 1415   GFRAA >60 11/23/2016 0928   GFRAA >60 11/12/2014 1415    No results found for: SPEP, UPEP  Lab Results  Component Value Date   WBC 3.5 (Wright) 11/23/2016   NEUTROABS 1.3 (Wright) 11/23/2016   HGB 14.4 11/23/2016   HCT 41.2 11/23/2016   MCV 97.3 11/23/2016   PLT 208 11/23/2016      Chemistry      Component Value Date/Time   NA 138 11/23/2016 0928   NA 137 11/12/2014 1415   K 3.9 11/23/2016 0928   K 4.1 11/12/2014 1415  CL 104 11/23/2016 0928   CL 105 11/12/2014 1415   CO2 28 11/23/2016 0928   CO2 24 11/12/2014 1415   BUN 22 (H) 11/23/2016 0928   BUN 24 (H) 11/12/2014 1415   CREATININE 0.89 11/23/2016 0928   CREATININE 1.02 11/12/2014 1415      Component Value Date/Time   CALCIUM 9.3 11/23/2016 0928   CALCIUM 9.2 11/12/2014 1415   ALKPHOS 52 11/23/2016 0928   ALKPHOS 103 11/12/2014 1415   AST 17 11/23/2016 0928   AST 23 11/12/2014 1415   ALT 12 (Wright) 11/23/2016 0928   ALT 16 (Wright) 11/12/2014 1415   BILITOT 1.0 11/23/2016 0928   BILITOT 0.4 11/12/2014 1415      Results for Newmann, William Wright (MRN [id number]) as of 11/24/2016 16:29  Ref. Range 08/02/2016 10:25 08/30/2016 10:00 09/27/2016 09:45 10/25/2016 09:04 11/23/2016 09:28  PSA Latest Ref Range: 0.00 - 4.00 ng/mL 15.38 (H) 12.22 (H) 12.23 (H) 9.49 (H) 9.29 (H)     RADIOGRAPHIC STUDIES: I have personally reviewed the radiological images as listed and agreed with the findings in the report. No results found.   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 Also on Zytiga plus Xtandi- as per clinical trial. March 2018 PSA 12. CT scan C/A/P- stable disease-lymphadenopathy in the pelvis and retroperitoneum; bone scan shows stable bone  metastases.  # Patient tolerating treatment very well- except for hot flashes/ ANC- 1.3 [C discussion below]. Continue same treatments.  No obvious signs of clinical progression.  # [place]- 1300; ? Viral infection; monitor for now. Unlikely secondary to medications.  # Hot flashes- G-1-  On Effexor improved.  # Bone lesions continue Xgeva; no side effects noted. Continue calcium and vitamin D.  Follow up with me in 4 weeks/ labs/X-geva/ CT-bone scans prior- covering MD; follow up with me in 2 months/labs.   No orders of the defined types were placed in this encounter.  All questions were answered. The patient knows to call the clinic with any problems, questions or concerns.      Cammie Sickle, MD 11/24/2016 4:28 PM

## 2016-11-23 NOTE — Telephone Encounter (Signed)
2nd Verified oral research medication sign out procedure.

## 2016-11-23 NOTE — Progress Notes (Signed)
William Wright returns to clinic today for consideration of cycle 27 treatment on the [facility] [id number] Prostate study receiving daily Enzalutamide, Abiraterone and Prednisone. VS remain stable. CBC and chemistries all within acceptable parameters for treatment. Patient's ANC was 1.3 and Dr. [name] stated this was not treatment related and not low enough to alter his planned treatment.  Patient denies fatigue and states he feels much better. He continues to report hot flashes but they have lessened in severity.  He returned his medication calendars which verify that he is taking the enzalutamide, abiraterone and prednisone as prescribed. He also returned the completed PK questionnaire and his old bottle of enzalutamide with 4 capsules remaining due to todays visit being scheduled one day later than usual. New medications exchanged in the pharmacy and a new bottle of 120 capsules of enzalutamide 40mg  was dispensed to patient. New medication calendars for cycle 27 were also given to William Wright. He reports that he is receiving his Abiraterone 250mg  capsules, #120 monthly from [facility], and he receives his prednisone prescription through a different mail order pharmacy in three month supplies. He will receive an Xgeva injection today and we will schedule his return study appointment on 12/20/16 with CT and bone scans to be performed on 12/14/16. Adverse events and attributions were assessed as follows:   Adverse Event Log  Study/Protocol: [facility] [id number] Cycle: 26 adverse events  Event Grade Onset Date Resolved Date Drug Name Enzalutamide + Abiraterone Attribution Treatment Comments  Fatigue Grade 1 11/25/14 11/23/16  Probably  Patient reports he feels better  Hot Flashes Grade 1 09/27/16   Possible  Improved some with Effexor  Renal Calculi Grade 2 09/22/16   Unrelated  Referred to urologist  Raynelle Dick, RN BSN "11/23/2016 1:58 PM"

## 2016-12-14 ENCOUNTER — Encounter
Admission: RE | Admit: 2016-12-14 | Discharge: 2016-12-14 | Disposition: A | Discharge status: Home / Self Care | Payer: Medicare Other | Source: Ambulatory Visit | Attending: Internal Medicine | Admitting: Internal Medicine

## 2016-12-14 ENCOUNTER — Ambulatory Visit
Admission: RE | Admit: 2016-12-14 | Discharge: 2016-12-14 | Disposition: A | Discharge status: Home / Self Care | Payer: Medicare Other | Source: Ambulatory Visit | Attending: Internal Medicine | Admitting: Internal Medicine

## 2016-12-14 ENCOUNTER — Ambulatory Visit: Admission: RE | Admit: 2016-12-14 | Payer: Medicare Other | Source: Ambulatory Visit

## 2016-12-14 DIAGNOSIS — I712 Thoracic aortic aneurysm, without rupture: Secondary | ICD-10-CM | POA: Diagnosis not present

## 2016-12-14 DIAGNOSIS — C799 Secondary malignant neoplasm of unspecified site: Secondary | ICD-10-CM | POA: Insufficient documentation

## 2016-12-14 DIAGNOSIS — C7951 Secondary malignant neoplasm of bone: Secondary | ICD-10-CM | POA: Insufficient documentation

## 2016-12-14 DIAGNOSIS — N1330 Unspecified hydronephrosis: Secondary | ICD-10-CM | POA: Insufficient documentation

## 2016-12-14 DIAGNOSIS — C61 Malignant neoplasm of prostate: Secondary | ICD-10-CM | POA: Diagnosis not present

## 2016-12-14 DIAGNOSIS — N132 Hydronephrosis with renal and ureteral calculous obstruction: Secondary | ICD-10-CM | POA: Diagnosis not present

## 2016-12-14 MED ORDER — IOPAMIDOL (ISOVUE-300) INJECTION 61%
100.0000 mL | Freq: Once | INTRAVENOUS | Status: AC | PRN
  Administered 2016-12-14: 100 mL via INTRAVENOUS

## 2016-12-14 MED ORDER — TECHNETIUM TC 99M MEDRONATE IV KIT
25.0000 | PACK | Freq: Once | INTRAVENOUS | Status: AC | PRN
  Administered 2016-12-14: 22.41 via INTRAVENOUS

## 2016-12-15 ENCOUNTER — Other Ambulatory Visit: Payer: Self-pay | Admitting: *Deleted

## 2016-12-15 DIAGNOSIS — C61 Malignant neoplasm of prostate: Secondary | ICD-10-CM

## 2016-12-16 DIAGNOSIS — N390 Urinary tract infection, site not specified: Secondary | ICD-10-CM | POA: Diagnosis not present

## 2016-12-16 DIAGNOSIS — R319 Hematuria, unspecified: Secondary | ICD-10-CM | POA: Diagnosis not present

## 2016-12-16 DIAGNOSIS — N200 Calculus of kidney: Secondary | ICD-10-CM | POA: Diagnosis not present

## 2016-12-18 ENCOUNTER — Telehealth: Payer: Self-pay | Admitting: *Deleted

## 2016-12-18 NOTE — Telephone Encounter (Signed)
Received t/c from patient William Wright reporting that his kidney stone started to move on 12/15/16 late in the evening. States he called his urologist for pain medication, but the urologist refused to give him a prescription and told him he would have to go to the walk-in clinic. Patient went to the walk-in-clinic this next day and got a prescription for generic Media. States he has an appointment in the morning with his urologist and he will call and let us know what the outcome is, but states he is experiencing significant pain at times and has vomited twice due to the pain. Patient to call back following his appointment to inform whether he will have another lithotripsy. [name], BSN, MHA, OCN 12/18/2016 3:48 PM

## 2016-12-19 DIAGNOSIS — N23 Unspecified renal colic: Secondary | ICD-10-CM | POA: Diagnosis not present

## 2016-12-19 DIAGNOSIS — R300 Dysuria: Secondary | ICD-10-CM | POA: Diagnosis not present

## 2016-12-19 DIAGNOSIS — D400 Neoplasm of uncertain behavior of prostate: Secondary | ICD-10-CM | POA: Diagnosis not present

## 2016-12-19 DIAGNOSIS — C61 Malignant neoplasm of prostate: Secondary | ICD-10-CM | POA: Diagnosis not present

## 2016-12-19 DIAGNOSIS — N201 Calculus of ureter: Secondary | ICD-10-CM | POA: Diagnosis not present

## 2016-12-19 NOTE — Progress Notes (Signed)
William Wright  [phone] [phone]  ID: DAEGON DEISS OB: 09-May-1951  MR#: [id number]  VHQ#:[id number]  Patient Care Team: System, Provider Not In as PCP - General Royston Cowper, MD (Urology)  CHIEF COMPLAINT: Prostate cancer metastatic to multiple sites [facility])  INTERVAL HISTORY: Patient returns to clinic today for further evaluation and continuation of treatment. He continues on clinical trial with Zytiga plus Xtandi.  Patient notes to have improvement of his hot flashes. He is having some mild abdominal pain secondary to kidney stones has lithotripsy scheduled for tomorrow. He otherwise feels well. He has no neurologic complaints. He has a good appetite and denies weight loss. He has no chest pain or shortness of breath. He denies any nausea, vomiting, consultation, or diarrhea. Patient offers no further specific complaints today.   REVIEW OF SYSTEMS:   Review of Systems  Constitutional: Negative for fever, malaise/fatigue and weight loss.  Respiratory: Negative.  Negative for cough and shortness of breath.   Cardiovascular: Negative.  Negative for chest pain and leg swelling.  Genitourinary: Positive for flank pain. Negative for hematuria.  Skin: Negative.  Negative for rash.  Neurological: Negative.  Negative for sensory change and weakness.  Psychiatric/Behavioral: Negative.  The patient is not nervous/anxious.     As per HPI. Otherwise, a complete review of systems is negative.  PAST MEDICAL HISTORY: Past Medical History:  Diagnosis Date  . Cancer of prostate ([place]) 11/18/2014  . Prostate cancer ([place])     PAST SURGICAL HISTORY: Past Surgical History:  Procedure Laterality Date  . EXTRACORPOREAL SHOCK WAVE LITHOTRIPSY Left 05/20/2015   Procedure: EXTRACORPOREAL SHOCK WAVE LITHOTRIPSY (ESWL);  Surgeon: Royston Cowper, MD;  Location: [facility] ORS;  Service: Urology;  Laterality: Left;  . EXTRACORPOREAL SHOCK WAVE LITHOTRIPSY Right  12/21/2016   Procedure: EXTRACORPOREAL SHOCK WAVE LITHOTRIPSY (ESWL);  Surgeon: Royston Cowper, MD;  Location: [facility] ORS;  Service: Urology;  Laterality: Right;    FAMILY HISTORY: No family history on file.  ADVANCED DIRECTIVES (Y/N):  N  HEALTH MAINTENANCE: Social History  Substance Use Topics  . Smoking status: Never Smoker  . Smokeless tobacco: Never Used  . Alcohol use No     Colonoscopy:  PAP:  Bone density:  Lipid panel:  Allergies  Allergen Reactions  . No Known Allergies     Current Outpatient Prescriptions  Medication Sig Dispense Refill  . abiraterone Acetate (ZYTIGA) 250 MG tablet Take 4 tablets (1,000 mg total) by mouth daily. Take on an empty stomach 1 hour before or 2 hours after a meal 120 tablet 4  . Investigational enzalutamide 40 MG capsule ALLIANCE [id number] Take 4 capsules (160 mg total) by mouth daily. Take with or without food.Swallow whole. Do not crush, or open capsules. 120 capsule 0  . Investigational enzalutamide 40 MG capsule ALLIANCE [id number] Take 4 capsules (160 mg total) by mouth daily. Take with or without food.Swallow whole. Do not crush, or open capsules. 120 capsule 0  . Investigational enzalutamide 40 MG capsule ALLIANCE [id number] Take 4 capsules (160 mg total) by mouth daily. Take with or without food.Swallow whole. Do not crush, or open capsules. 120 capsule 0  . Investigational enzalutamide 40 MG capsule ALLIANCE [id number] Take 4 capsules (160 mg total) by mouth daily. Take with or without food.Swallow whole. Do not crush, or open capsules. 120 capsule 0  . predniSONE (DELTASONE) 5 MG tablet Take 1 tablet (5 mg total) by mouth 2 (two) times daily. 60 tablet 0  .  tamsulosin (FLOMAX) 0.4 MG CAPS capsule Take 1 capsule (0.4 mg total) by mouth daily. 30 capsule 11  . Triptorelin Pamoate (TRELSTAR) 22.5 MG injection Inject 22.5 mg into the muscle every 6 (six) months.    . venlafaxine XR (EFFEXOR-XR) 37.5 MG 24 hr capsule Take 1 capsule (37.5 mg total)  by mouth daily with breakfast. 30 capsule 4  . diphenhydrAMINE (BENADRYL) 25 mg capsule Take 1 capsule (25 mg total) by mouth once. 30 capsule 0  . Investigational enzalutamide 40 MG capsule ALLIANCE [id number] Take 4 capsules (160 mg total) by mouth daily. Take with or without food.Swallow whole. Do not crush, or open capsules. 120 capsule 0  . levofloxacin (LEVAQUIN) 500 MG tablet Take 1 tablet (500 mg total) by mouth daily. 7 tablet 0  . NUCYNTA 50 MG tablet Take 1 tablet (50 mg total) by mouth every 6 (six) hours as needed for moderate pain. 1 TO 2 TABS Q 6 HOURS PRN PAIN 30 tablet 0  . ondansetron (ZOFRAN ODT) 8 MG disintegrating tablet Take 1 tablet (8 mg total) by mouth every 6 (six) hours as needed for nausea or vomiting. 4 tablet 3  . valACYclovir (VALTREX) 1000 MG tablet Take 1 tablet (1,000 mg total) by mouth 2 (two) times daily. 60 tablet 6   No current facility-administered medications for this visit.     OBJECTIVE: Vitals:   12/20/16 1404  BP: 131/84  Pulse: 61  Resp: 18  Temp: 97.6 F (36.4 C)     Body mass index is 23.75 kg/m.    ECOG FS:0 - Asymptomatic  General: Well-developed, well-nourished, no acute distress. Eyes: Pink conjunctiva, anicteric sclera. Lungs: Clear to auscultation bilaterally. Heart: Regular rate and rhythm. No rubs, murmurs, or gallops. Abdomen: Soft, nontender, nondistended. No organomegaly noted, normoactive bowel sounds. Musculoskeletal: No edema, cyanosis, or clubbing. Neuro: Alert, answering all questions appropriately. Cranial nerves grossly intact. Skin: No rashes or petechiae noted. Psych: Normal affect.   LAB RESULTS:  Lab Results  Component Value Date   NA 135 12/20/2016   K 4.7 12/20/2016   CL 102 12/20/2016   CO2 25 12/20/2016   GLUCOSE 110 (H) 12/20/2016   BUN 29 (H) 12/20/2016   CREATININE 1.09 12/20/2016   CALCIUM 9.4 12/20/2016   PROT 7.0 12/20/2016   ALBUMIN 3.9 12/20/2016   AST 11 (L) 12/20/2016   ALT 8 (L) 12/20/2016    ALKPHOS 65 12/20/2016   BILITOT 0.8 12/20/2016   GFRNONAA >60 12/20/2016   GFRAA >60 12/20/2016    Lab Results  Component Value Date   WBC 5.6 12/20/2016   NEUTROABS 4.0 12/20/2016   HGB 13.4 12/20/2016   HCT 37.9 (L) 12/20/2016   MCV 96.3 12/20/2016   PLT 226 12/20/2016     STUDIES: Ct Chest W Contrast  Result Date: 12/14/2016 CLINICAL DATA:  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 EXAM: CT CHEST, ABDOMEN, AND PELVIS WITH CONTRAST TECHNIQUE: Multidetector CT imaging of the chest, abdomen and pelvis was performed following the standard protocol during bolus administration of intravenous contrast. CONTRAST:  177mL ISOVUE-300 IOPAMIDOL (ISOVUE-300) INJECTION 61% COMPARISON:  09/22/2016 CT chest, abdomen and pelvis. FINDINGS: RECIST 1.1 Target Lesions: 1. Mediastinal right retrocrural lymph node: 2.6 cm (series 2/image 49), previously 2.6 cm 2. Left periaortic lymph node: 1.3 cm (series 2/ image 29), previously 1.3 cm Non-target Lesions: 1. Lymph node superior to pancreas: 1.2 cm (series 2/ image 57), previously 1.2 cm 2. Portacaval lymph node: 1.2 cm (series  2/ image 59), previously 1.2 cm CT CHEST FINDINGS Cardiovascular: Normal heart size. No significant pericardial fluid/thickening. Left anterior descending coronary atherosclerosis. Ascending thoracic aortic 4.5 cm aneurysm (series 2/ image 27), stable. Normal caliber pulmonary arteries. No central pulmonary emboli. Mediastinum/Nodes: No discrete thyroid nodules. Unremarkable esophagus. No pathologically enlarged axillary, mediastinal or hilar lymph nodes. Right para-aortic posterior mediastinal node measures 0.6 cm short axis (series 2/ image 29), unchanged and within normal limits. Right retrocrural node measures 0.7 cm short axis (series 2/ image 49), unchanged and within normal limits. Lungs/Pleura: No pneumothorax. No pleural effusion. No acute consolidative airspace disease, lung masses  or significant pulmonary nodules. Scattered parenchymal bands in the mid to lower lungs bilaterally are unchanged and compatible with mild postinfectious/ postinflammatory scarring. Musculoskeletal: Stable sclerotic lesions in the right posterior eighth rib and left posterior third and left posterior seventh ribs. Stable sclerotic upper right scapular lesion . Stable sclerotic osseous lesion replacing the T6 vertebral body with associated chronic mild pathologic fracture. Stable sclerotic T7, T10 and T12 vertebral lesions. Mild thoracic spondylosis. No new thoracic osseous lesions. CT ABDOMEN PELVIS FINDINGS Hepatobiliary: Normal liver with no liver mass. Normal gallbladder with no radiopaque cholelithiasis. No biliary ductal dilatation. Pancreas: Normal, with no mass or duct dilation. Spleen: Normal size. No mass. Adrenals/Urinary Tract: Normal adrenals. Right ureteropelvic junction 5 mm stone with minimal right hydronephrosis. No left hydronephrosis. No renal mass. Collapsed and grossly normal bladder. Stomach/Bowel: Grossly normal stomach. Normal caliber small bowel with no small bowel wall thickening. Normal appendix. Moderate sigmoid diverticulosis, with no large bowel wall thickening or pericolonic fat stranding. Vascular/Lymphatic: Normal caliber abdominal aorta. Patent portal, splenic, hepatic and renal veins. No pathologically enlarged lymph nodes in the abdomen or pelvis. Top-normal size left para-aortic nodes are stable. Reproductive: Normal size prostate with nonspecific internal prostatic calcifications. Other: No pneumoperitoneum, ascites or focal fluid collection. Musculoskeletal: Sclerotic osseous lesions throughout the L2, L3, L4 and L5 vertebral levels, in the superior right iliac bone and left ischium are unchanged. No new focal osseous lesions. Mild lumbar spondylosis. IMPRESSION: 1. Recist findings as above. 2. No new or progressive metastatic disease. Stable sclerotic osseous metastases  throughout the axial skeleton. No pathologically enlarged thoracic or abdominopelvic nodes. 3. Right UPJ 5 mm stone with minimal right hydronephrosis. 4. Stable 4.5 cm ascending thoracic aortic aneurysm. Ascending thoracic aortic aneurysm. Recommend semi-annual imaging followup by CTA or MRA and referral to cardiothoracic surgery if not already obtained. This recommendation follows 2010 ACCF/AHA/AATS/ACR/ASA/SCA/SCAI/SIR/STS/SVM Guidelines for the Diagnosis and Management of Patients With Thoracic Aortic Disease. Circulation. 2010; 121: B638-L373. Electronically Signed   By: Ilona Sorrel M.D.   On: 12/14/2016 16:21   Nm Bone Scan Whole Body  Result Date: 12/14/2016 CLINICAL DATA:  Prostate cancer. Multiple sites of metastasis. Research study EXAM: NUCLEAR MEDICINE WHOLE BODY BONE SCAN TECHNIQUE: Whole body anterior and posterior images were obtained approximately 3 hours after intravenous injection of radiopharmaceutical. RADIOPHARMACEUTICALS:  22.4 mCi Technetium-39m MDP IV COMPARISON:  Bone scan 09/22/2016 PCWG2 Measurements for Bone Metastasis: (baseline 10/21/2014) 1. New lesion in the posterior medial LEFT sixth rib. FINDINGS: Several foci of skeletal metastasis again demonstrated including the RIGHT scapula, T12 vertebral body, L2 vertebral body and L 3 vertebral body. New lesion in the medial aspect of the posterior LEFT sixth rib. IMPRESSION: 1. New lesion in the posteromedial LEFT sixth rib. Concern for metastatic lesion. Recommend attention on follow-up. 2. Stable additional metastatic lesions. Electronically Signed   By: Helane Gunther.D.  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 EXAM: CT CHEST, ABDOMEN, AND PELVIS WITH CONTRAST TECHNIQUE: Multidetector CT imaging of the chest, abdomen and pelvis was performed following the standard protocol during  bolus administration of intravenous contrast. CONTRAST:  145mL ISOVUE-300 IOPAMIDOL (ISOVUE-300) INJECTION 61% COMPARISON:  09/22/2016 CT chest, abdomen and pelvis. FINDINGS: RECIST 1.1 Target Lesions: 1. Mediastinal right retrocrural lymph node: 2.6 cm (series 2/image 49), previously 2.6 cm 2. Left periaortic lymph node: 1.3 cm (series 2/ image 29), previously 1.3 cm Non-target Lesions: 1. Lymph node superior to pancreas: 1.2 cm (series 2/ image 57), previously 1.2 cm 2. Portacaval lymph node: 1.2 cm (series 2/ image 59), previously 1.2 cm CT CHEST FINDINGS Cardiovascular: Normal heart size. No significant pericardial fluid/thickening. Left anterior descending coronary atherosclerosis. Ascending thoracic aortic 4.5 cm aneurysm (series 2/ image 27), stable. Normal caliber pulmonary arteries. No central pulmonary emboli. Mediastinum/Nodes: No discrete thyroid nodules. Unremarkable esophagus. No pathologically enlarged axillary, mediastinal or hilar lymph nodes. Right para-aortic posterior mediastinal node measures 0.6 cm short axis (series 2/ image 29), unchanged and within normal limits. Right retrocrural node measures 0.7 cm short axis (series 2/ image 49), unchanged and within normal limits. Lungs/Pleura: No pneumothorax. No pleural effusion. No acute consolidative airspace disease, lung masses or significant pulmonary nodules. Scattered parenchymal bands in the mid to lower lungs bilaterally are unchanged and compatible with mild postinfectious/ postinflammatory scarring. Musculoskeletal: Stable sclerotic lesions in the right posterior eighth rib and left posterior third and left posterior seventh ribs. Stable sclerotic upper right scapular lesion . Stable sclerotic osseous lesion replacing the T6 vertebral body with associated chronic mild pathologic fracture. Stable sclerotic T7, T10 and T12 vertebral lesions. Mild thoracic spondylosis. No new thoracic osseous lesions. CT ABDOMEN PELVIS FINDINGS Hepatobiliary:  Normal liver with no liver mass. Normal gallbladder with no radiopaque cholelithiasis. No biliary ductal dilatation. Pancreas: Normal, with no mass or duct dilation. Spleen: Normal size. No mass. Adrenals/Urinary Tract: Normal adrenals. Right ureteropelvic junction 5 mm stone with minimal right hydronephrosis. No left hydronephrosis. No renal mass. Collapsed and grossly normal bladder. Stomach/Bowel: Grossly normal stomach. Normal caliber small bowel with no small bowel wall thickening. Normal appendix. Moderate sigmoid diverticulosis, with no large bowel wall thickening or pericolonic fat stranding. Vascular/Lymphatic: Normal caliber abdominal aorta. Patent portal, splenic, hepatic and renal veins. No pathologically enlarged lymph nodes in the abdomen or pelvis. Top-normal size left para-aortic nodes are stable. Reproductive: Normal size prostate with nonspecific internal prostatic calcifications. Other: No pneumoperitoneum, ascites or focal fluid collection. Musculoskeletal: Sclerotic osseous lesions throughout the L2, L3, L4 and L5 vertebral levels, in the superior right iliac bone and left ischium are unchanged. No new focal osseous lesions. Mild lumbar spondylosis. IMPRESSION: 1. Recist findings as above. 2. No new or progressive metastatic disease. 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 No pathologically enlarged thoracic or abdominopelvic nodes. 3. Right UPJ 5 mm stone with minimal right hydronephrosis. 4. Stable 4.5 cm ascending thoracic aortic aneurysm. Ascending thoracic aortic aneurysm. Recommend semi-annual imaging followup by CTA or MRA and referral to cardiothoracic surgery if not already obtained. This recommendation follows 2010 ACCF/AHA/AATS/ACR/ASA/SCA/SCAI/SIR/STS/SVM Guidelines for the Diagnosis and Management of Patients With Thoracic Aortic Disease. Circulation. 2010; 121: P824-M353. Electronically Signed   By: Ilona Sorrel M.D.   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 Also on Zytiga plus Xtandi- as per clinical trial. Patient's most recent PSA on Nov 23, 2016 was reported at 9.29. CT scan of bone scan results reviewed independently with essentially stable disease.   # Patient tolerating treatment very well- except for hot flashes/ ANC- 4.0 today. Continue same treatments.  No obvious signs of clinical progression.  # Decreased ANC- resolved.  # Hot flashes- G-1-  On Effexor improved.  # Bone lesions continue Xgeva; no side effects noted. Continue calcium and vitamin D.  # Kidney stones: Lithotripsy scheduled for tomorrow.  Follow up as previously scheduled. Patient expressed understanding and was in agreement with this plan. He also understands that He can call clinic at any time with any questions, concerns, or complaints.   Cancer Staging Cancer of prostate [facility]) Staging form: Prostate, AJCC 7th Edition - Clinical: Stage IV (T2, M1) - Signed by Evlyn Kanner, NP on 01/07/2015   Lloyd Huger, MD   12/26/2016 2:20 PM

## 2016-12-20 ENCOUNTER — Inpatient Hospital Stay: Payer: Medicare Other

## 2016-12-20 ENCOUNTER — Inpatient Hospital Stay: Payer: Medicare Other | Attending: Oncology

## 2016-12-20 ENCOUNTER — Inpatient Hospital Stay (HOSPITAL_BASED_OUTPATIENT_CLINIC_OR_DEPARTMENT_OTHER): Payer: Medicare Other | Admitting: Oncology

## 2016-12-20 ENCOUNTER — Encounter: Payer: Self-pay | Admitting: *Deleted

## 2016-12-20 VITALS — BP 131/84 | HR 61 | Temp 97.6°F | Resp 18 | Wt 165.5 lb

## 2016-12-20 DIAGNOSIS — C61 Malignant neoplasm of prostate: Secondary | ICD-10-CM

## 2016-12-20 DIAGNOSIS — Z79818 Long term (current) use of other agents affecting estrogen receptors and estrogen levels: Secondary | ICD-10-CM | POA: Insufficient documentation

## 2016-12-20 DIAGNOSIS — Z7982 Long term (current) use of aspirin: Secondary | ICD-10-CM | POA: Diagnosis not present

## 2016-12-20 DIAGNOSIS — C7951 Secondary malignant neoplasm of bone: Secondary | ICD-10-CM | POA: Insufficient documentation

## 2016-12-20 DIAGNOSIS — Z006 Encounter for examination for normal comparison and control in clinical research program: Secondary | ICD-10-CM | POA: Insufficient documentation

## 2016-12-20 DIAGNOSIS — Z7952 Long term (current) use of systemic steroids: Secondary | ICD-10-CM | POA: Diagnosis not present

## 2016-12-20 LAB — CBC WITH DIFFERENTIAL/PLATELET
BASOS ABS: 0 10*3/uL (ref 0–0.1)
Basophils Relative: 1 %
Eosinophils Absolute: 0 10*3/uL (ref 0–0.7)
Eosinophils Relative: 1 %
HEMATOCRIT: 37.9 % — AB (ref 40.0–52.0)
Hemoglobin: 13.4 g/dL (ref 13.0–18.0)
LYMPHS PCT: 18 %
Lymphs Abs: 1 10*3/uL (ref 1.0–3.6)
MCH: 33.9 pg (ref 26.0–34.0)
MCHC: 35.3 g/dL (ref 32.0–36.0)
MCV: 96.3 fL (ref 80.0–100.0)
Monocytes Absolute: 0.5 10*3/uL (ref 0.2–1.0)
Monocytes Relative: 9 %
NEUTROS ABS: 4 10*3/uL (ref 1.4–6.5)
Neutrophils Relative %: 71 %
PLATELETS: 226 10*3/uL (ref 150–440)
RBC: 3.94 MIL/uL — AB (ref 4.40–5.90)
RDW: 13.2 % (ref 11.5–14.5)
WBC: 5.6 10*3/uL (ref 3.8–10.6)

## 2016-12-20 LAB — COMPREHENSIVE METABOLIC PANEL
ALT: 8 U/L — AB (ref 17–63)
AST: 11 U/L — AB (ref 15–41)
Albumin: 3.9 g/dL (ref 3.5–5.0)
Alkaline Phosphatase: 65 U/L (ref 38–126)
Anion gap: 8 (ref 5–15)
BILIRUBIN TOTAL: 0.8 mg/dL (ref 0.3–1.2)
BUN: 29 mg/dL — AB (ref 6–20)
CHLORIDE: 102 mmol/L (ref 101–111)
CO2: 25 mmol/L (ref 22–32)
CREATININE: 1.09 mg/dL (ref 0.61–1.24)
Calcium: 9.4 mg/dL (ref 8.9–10.3)
GFR calc Af Amer: >60 mL/min (ref >60)
GLUCOSE: 110 mg/dL — AB (ref 65–99)
Potassium: 4.7 mmol/L (ref 3.5–5.1)
Sodium: 135 mmol/L (ref 135–145)
Total Protein: 7 g/dL (ref 6.5–8.1)

## 2016-12-20 MED ORDER — DENOSUMAB 120 MG/1.7ML ~~LOC~~ SOLN
120.0000 mg | Freq: Once | SUBCUTANEOUS | Status: AC
  Administered 2016-12-20: 120 mg via SUBCUTANEOUS
  Filled 2016-12-20: qty 1.7

## 2016-12-20 MED ORDER — INV-ENZALUTAMIDE 40 MG CAPS #120 ALLIANCE A031201: 160.0000 mg | ORAL_CAPSULE | Freq: Every day | ORAL | 0 refills | Status: DC

## 2016-12-20 NOTE — Progress Notes (Signed)
Patient is here for follow up, he does have a lithotripsy scheduled for tomorrow.

## 2016-12-20 NOTE — Progress Notes (Signed)
William Wright returns to clinic today for consideration of cycle 28 treatment on the Alliance [id number] Prostate study receiving daily Enzalutamide, Abiraterone and Prednisone. VS remain stable. CBC and chemistries all within acceptable parameters for treatment.  Noted that his Hct decreased to 37.9 and patient reports occasional hematuria when renal calculus moves.  Patient c/o mild fatigue, states it is worsened with his renal colic. He continues to report hot flashes but they have lessened in severity with Effexor.  He returned his medication calendars which verify that he is taking the enzalutamide, abiraterone and prednisone as prescribed.  Reports he missed one dose of Prednisone and one dose of enzalutamide due to vomiting on 12/16/16.  This is noted on his pill diaries. He also returned the completed PK questionnaire and his old bottle of enzalutamide with 8 capsules. New medications exchanged in the pharmacy and a new bottle of 120 capsules of enzalutamide 40mg  was dispensed to patient. New medication calendars for cycle 28 were also given to [name]. He reports that he is receiving his Abiraterone 250mg  capsules, #120 monthly from [facility], and he receives his prednisone prescription through a different mail order pharmacy in three month supplies. He will receive an Xgeva injection today and we will schedule his return study appointment on 01/18/17.  Dr. Grayland Ormond in to see patient and reviewed his CT and bone scan results.  Patient is scheduled for lithotripsy on 12/21/16 and has an appointment with Dr. Lucky Cowboy on 12/22/16 to follow up on his abdominal aortic aneurysm which is currently stable.  Pat Adverse events and attributions were assessed as follows:   Adverse Event Log  Study/Protocol: Alliance [id number] Cycle: 27 adverse events  Event Grade Onset Date Resolved Date Drug Name Enzalutamide + Abiraterone Attribution Treatment Comments  Fatigue Grade 1 12/20/16   Probably  Reports increased fatigue   Hot Flashes Grade 1 09/27/16   Possible  Improved some with Effexor  Renal Calculi Grade 3 12/15/16   Unrelated  Scheduled for lithotripsy on 12/21/16.  Nausea/Vomiting Grade 2 12/15/16 12/17/16  Unrelated  Renal Colic  Dypspepsia Grade 1 12/15/16   Unrelated  Pain meds for renal colic  Pain/flank Grade 3 12/15/16   Unrelated  Renal colic, severe at times  Hematuria Grade 2 12/15/16   Unrelated  Due to renal calculi.  Raynelle Dick, RN BSN "12/20/2016 3:03 PM"

## 2016-12-21 ENCOUNTER — Encounter
Admission: RE | Disposition: A | Discharge status: Home / Self Care | Payer: Self-pay | Source: Ambulatory Visit | Attending: Urology

## 2016-12-21 ENCOUNTER — Encounter: Payer: Self-pay | Admitting: *Deleted

## 2016-12-21 ENCOUNTER — Ambulatory Visit
Admission: RE | Admit: 2016-12-21 | Discharge: 2016-12-21 | Disposition: A | Discharge status: Home / Self Care | Payer: Medicare Other | Source: Ambulatory Visit | Attending: Urology | Admitting: Urology

## 2016-12-21 DIAGNOSIS — N201 Calculus of ureter: Secondary | ICD-10-CM

## 2016-12-21 DIAGNOSIS — C61 Malignant neoplasm of prostate: Secondary | ICD-10-CM | POA: Diagnosis not present

## 2016-12-21 DIAGNOSIS — Z79899 Other long term (current) drug therapy: Secondary | ICD-10-CM | POA: Diagnosis not present

## 2016-12-21 HISTORY — PX: EXTRACORPOREAL SHOCK WAVE LITHOTRIPSY: SHX1557

## 2016-12-21 SURGERY — LITHOTRIPSY, ESWL
Anesthesia: Moderate Sedation | Laterality: Right

## 2016-12-21 MED ORDER — ONDANSETRON 8 MG PO TBDP: 8.0000 mg | ORAL_TABLET | Freq: Four times a day (QID) | ORAL | 3 refills | Status: DC | PRN

## 2016-12-21 MED ORDER — FUROSEMIDE 10 MG/ML IJ SOLN
10.0000 mg | Freq: Once | INTRAMUSCULAR | Status: AC
  Administered 2016-12-21: 10 mg via INTRAVENOUS

## 2016-12-21 MED ORDER — SODIUM CHLORIDE 0.9 % IV SOLN
Freq: Once | INTRAVENOUS | Status: AC
  Administered 2016-12-21: 12:00:00 via INTRAVENOUS

## 2016-12-21 MED ORDER — MORPHINE SULFATE (PF) 10 MG/ML IV SOLN
10.0000 mg | Freq: Once | INTRAVENOUS | Status: AC
  Administered 2016-12-21: 10 mg via INTRAMUSCULAR

## 2016-12-21 MED ORDER — NUCYNTA 50 MG PO TABS: 50.0000 mg | ORAL_TABLET | Freq: Four times a day (QID) | ORAL | 0 refills | Status: DC | PRN

## 2016-12-21 MED ORDER — PROMETHAZINE HCL 25 MG/ML IJ SOLN
INTRAMUSCULAR | Status: AC
  Filled 2016-12-21: qty 1

## 2016-12-21 MED ORDER — LEVOFLOXACIN 500 MG PO TABS: 500.0000 mg | ORAL_TABLET | Freq: Every day | ORAL | 0 refills | Status: DC

## 2016-12-21 MED ORDER — DIPHENHYDRAMINE HCL 25 MG PO CAPS: 25.0000 mg | ORAL_CAPSULE | Freq: Once | ORAL | 0 refills | Status: DC

## 2016-12-21 MED ORDER — MIDAZOLAM HCL 2 MG/2ML IJ SOLN
INTRAMUSCULAR | Status: AC
  Filled 2016-12-21: qty 2

## 2016-12-21 MED ORDER — MORPHINE SULFATE (PF) 10 MG/ML IV SOLN
INTRAVENOUS | Status: AC
  Filled 2016-12-21: qty 1

## 2016-12-21 MED ORDER — MIDAZOLAM HCL 2 MG/2ML IJ SOLN
1.0000 mg | Freq: Once | INTRAMUSCULAR | Status: AC
  Administered 2016-12-21: 1 mg via INTRAMUSCULAR

## 2016-12-21 MED ORDER — FUROSEMIDE 10 MG/ML IJ SOLN
INTRAMUSCULAR | Status: AC
  Filled 2016-12-21: qty 2

## 2016-12-21 MED ORDER — PROMETHAZINE HCL 25 MG/ML IJ SOLN
25.0000 mg | Freq: Once | INTRAMUSCULAR | Status: AC
  Administered 2016-12-21: 25 mg via INTRAMUSCULAR

## 2016-12-21 MED ORDER — DIPHENHYDRAMINE HCL 25 MG PO CAPS
ORAL_CAPSULE | ORAL | Status: AC
  Filled 2016-12-21: qty 1

## 2016-12-21 MED ORDER — DEXTROSE 5 % IV SOLN
1.0000 g | Freq: Once | INTRAVENOUS | Status: AC
  Administered 2016-12-21: 1 g via INTRAVENOUS
  Filled 2016-12-21: qty 10

## 2016-12-21 MED ORDER — DIPHENHYDRAMINE HCL 25 MG PO CAPS: 25.0000 mg | ORAL_CAPSULE | Freq: Once | ORAL | Status: DC

## 2016-12-21 NOTE — OR Nursing (Addendum)
Dr. Yves Dill into see pt. Pt's right flank with 3 reddened areas from lithotripsy, no opening in skin.

## 2016-12-21 NOTE — Discharge Instructions (Signed)
Kidney Stones °Kidney stones (urolithiasis) are rock-like masses that form inside of the kidneys. Kidneys are organs that make pee (urine). A kidney stone can cause very bad pain and can block the flow of pee. The stone usually leaves your body (passes) through your pee. You may need to have a doctor take out the stone. °Follow these instructions at home: °Eating and drinking °· Drink enough fluid to keep your pee clear or pale yellow. This will help you pass the stone. °· If told by your doctor, change the foods you eat (your diet). This may include: °? Limiting how much salt (sodium) you eat. °? Eating more fruits and vegetables. °? Limiting how much meat, poultry, fish, and eggs you eat. °· Follow instructions from your doctor about eating or drinking restrictions. °General instructions °· Collect pee samples as told by your doctor. You may need to collect a pee sample: °? 24 hours after a stone comes out. °? 8-12 weeks after a stone comes out, and every 6-12 months after that. °· Strain your pee every time you pee (urinate), for as long as told. Use the strainer that your doctor recommends. °· Do not throw out the stone. Keep it so that it can be tested by your doctor. °· Take over-the-counter and prescription medicines only as told by your doctor. °· Keep all follow-up visits as told by your doctor. This is important. You may need follow-up tests. °Preventing kidney stones °To prevent another kidney stone: °· Drink enough fluid to keep your pee clear or pale yellow. This is the best way to prevent kidney stones. °· Eat healthy foods. °· Avoid certain foods as told by your doctor. You may be told to eat less protein. °· Stay at a healthy weight. ° °Contact a doctor if: °· You have pain that gets worse or does not get better with medicine. °Get help right away if: °· You have a fever or chills. °· You get very bad pain. °· You get new pain in your belly (abdomen). °· You pass out (faint). °· You cannot pee. °This  information is not intended to replace advice given to you by your health care provider. Make sure you discuss any questions you have with your health care provider. °Document Released: 12/20/2007 Document Revised: 03/21/2016 Document Reviewed: 03/21/2016 °Elsevier Interactive Patient Education © 2017 [employer] ° ° °Lithotripsy °Lithotripsy is a treatment that can sometimes help eliminate kidney stones and the pain that they cause. A form of lithotripsy, also known as extracorporeal shock wave lithotripsy, is a nonsurgical procedure that crushes a kidney stone with shock waves. These shock waves pass through your body and focus on the kidney stone. They cause the kidney stone to break up while it is still in the urinary tract. This makes it easier for the smaller pieces of stone to pass in the urine. °Tell a health care provider about: °· Any allergies you have. °· All medicines you are taking, including vitamins, herbs, eye drops, creams, and over-the-counter medicines. °· Any blood disorders you have. °· Any surgeries you have had. °· Any medical conditions you have. °· Whether you are pregnant or may be pregnant. °· Any problems you or family members have had with anesthetic medicines. °What are the risks? °Generally, this is a safe procedure. However, problems may occur, including: °· Infection. °· Bleeding of the kidney. °· Bruising of the kidney or skin. °· Scarring of the kidney, which can lead to: °? Increased blood pressure. °? Poor   kidney function. °? Return (recurrence) of kidney stones. °· Damage to other structures or organs, such as the liver, colon, spleen, or pancreas. °· Blockage (obstruction) of the the tube that carries urine from the kidney to the bladder (ureter). °· Failure of the kidney stone to break into pieces (fragments). ° °What happens before the procedure? °Staying hydrated °Follow instructions from your health care provider about hydration, which may include: °· Up to 2 hours  before the procedure - you may continue to drink clear liquids, such as water, clear fruit juice, black coffee, and plain tea. ° °Eating and drinking restrictions °Follow instructions from your health care provider about eating and drinking, which may include: °· 8 hours before the procedure - stop eating heavy meals or foods such as meat, fried foods, or fatty foods. °· 6 hours before the procedure - stop eating light meals or foods, such as toast or cereal. °· 6 hours before the procedure - stop drinking milk or drinks that contain milk. °· 2 hours before the procedure - stop drinking clear liquids. ° °General instructions °· Plan to have someone take you home from the hospital or clinic. °· Ask your health care provider about: °? Changing or stopping your regular medicines. This is especially important if you are taking diabetes medicines or blood thinners. °? Taking medicines such as aspirin and ibuprofen. These medicines and other NSAIDs can thin your blood. Do not take these medicines for 7 days before your procedure if your health care provider instructs you not to. °· You may have tests, such as: °? Blood tests. °? Urine tests. °? Imaging tests, such as a CT scan. °What happens during the procedure? °· To lower your risk of infection: °? Your health care team will wash or sanitize their hands. °? Your skin will be washed with soap. °· An IV tube will be inserted into one of your veins. This tube will give you fluids and medicines. °· You will be given one or more of the following: °? A medicine to help you relax (sedative). °? A medicine to make you fall asleep (general anesthetic). °· A water-filled cushion may be placed behind your kidney or on your abdomen. In some cases you may be placed in a tub of lukewarm water. °· Your body will be positioned in a way that makes it easy to target the kidney stone. °· A flexible tube with holes in it (stent) may be placed in the ureter. This will help keep urine  flowing from the kidney if the fragments of the stone have been blocking the ureter. °· An X-ray or ultrasound exam will be done to locate your stone. °· Shock waves will be aimed at the stone. If you are awake, you may feel a tapping sensation as the shock waves pass through your body. °The procedure may vary among health care providers and hospitals. °What happens after the procedure? °· You may have an X-ray to see whether the procedure was able to break up the kidney stone and how much of the stone has passed. If large stone fragments remain after treatment, you may need to have a second procedure at a later time. °· Your blood pressure, heart rate, breathing rate, and blood oxygen level will be monitored until the medicines you were given have worn off. °· You may be given antibiotics or pain medicine as needed. °· If a stent was placed in your ureter during surgery, it may stay in place for a few   weeks.  You may need strain your urine to collect pieces of the kidney stone for testing.  You will need to drink plenty of water.  Do not drive for 24 hours if you were given a sedative. Summary  Lithotripsy is a treatment that can sometimes help eliminate kidney stones and the pain that they cause.  A form of lithotripsy, also known as extracorporeal shock wave lithotripsy, is a nonsurgical procedure that crushes a kidney stone with shock waves.  Generally, this is a safe procedure. However, problems may occur, including damage to the kidney or other organs, infection, or obstruction of the tube that carries urine from the kidney to the bladder (ureter).  When you go home, you will need to drink plenty of water. You may be asked to strain your urine to collect pieces of the kidney stone for testing. This information is not intended to replace advice given to you by your health care provider. Make sure you discuss any questions you have with your health care provider. Document Released: 06/30/2000  Document Revised: 05/24/2016 Document Reviewed: 05/24/2016 Elsevier Interactive Patient Education  2017 [place] After This sheet gives you information about how to care for yourself after your procedure. Your health care provider may also give you more specific instructions. If you have problems or questions, contact your health care provider. What can I expect after the procedure? After the procedure, it is common to have:  Some blood in your urine. This should only last for a few days.  Soreness in your back, sides, or upper abdomen for a few days.  Blotches or bruises on your back where the pressure wave entered the skin.  Pain, discomfort, or nausea when pieces (fragments) of the kidney stone move through the tube that carries urine from the kidney to the bladder (ureter). Stone fragments may pass soon after the procedure, but they may continue to pass for up to 4-8 weeks. ? If you have severe pain or nausea, contact your health care provider. This may be caused by a large stone that was not broken up, and this may mean that you need more treatment.  Some pain or discomfort during urination.  Some pain or discomfort in the lower abdomen or (in men) at the base of the penis.  Follow these instructions at home: Medicines  Take over-the-counter and prescription medicines only as told by your health care provider.  If you were prescribed an antibiotic medicine, take it as told by your health care provider. Do not stop taking the antibiotic even if you start to feel better.  Do not drive for 24 hours if you were given a medicine to help you relax (sedative).  Do not drive or use heavy machinery while taking prescription pain medicine. Eating and drinking  Drink enough water and fluids to keep your urine clear or pale yellow. This helps any remaining pieces of the stone to pass. It can also help prevent new stones from forming.  Eat plenty of fresh fruits  and vegetables.  Follow instructions from your health care provider about eating and drinking restrictions. You may be instructed: ? To reduce how much salt (sodium) you eat or drink. Check ingredients and nutrition facts on packaged foods and beverages. ? To reduce how much meat you eat.  Eat the recommended amount of calcium for your age and gender. Ask your health care provider how much calcium you should have. General instructions  Get plenty of rest.  Most people can resume normal activities 1-2 days after the procedure. Ask your health care provider what activities are safe for you.  If directed, strain all urine through the strainer that was provided by your health care provider. ? Keep all fragments for your health care provider to see. Any stones that are found may be sent to a medical lab for examination. The stone may be as small as a grain of salt.  Keep all follow-up visits as told by your health care provider. This is important. Contact a health care provider if:  You have pain that is severe or does not get better with medicine.  You have nausea that is severe or does not go away.  You have blood in your urine longer than your health care provider told you to expect.  You have more blood in your urine.  You have pain during urination that does not go away.  You urinate more frequently than usual and this does not go away.  You develop a rash or any other possible signs of an allergic reaction. Get help right away if:  You have severe pain in your back, sides, or upper abdomen.  You have severe pain while urinating.  Your urine is very dark red.  You have blood in your stool (feces).  You cannot pass any urine at all.  You feel a strong urge to urinate after emptying your bladder.  You have a fever or chills.  You develop shortness of breath, difficulty breathing, or chest pain.  You have severe nausea that leads to persistent vomiting.  You  faint. Summary  After this procedure, it is common to have some pain, discomfort, or nausea when pieces (fragments) of the kidney stone move through the tube that carries urine from the kidney to the bladder (ureter). If this pain or nausea is severe, however, you should contact your health care provider.  Most people can resume normal activities 1-2 days after the procedure. Ask your health care provider what activities are safe for you.  Drink enough water and fluids to keep your urine clear or pale yellow. This helps any remaining pieces of the stone to pass, and it can help prevent new stones from forming.  If directed, strain your urine and keep all fragments for your health care provider to see. Fragments or stones may be as small as a grain of salt.  Get help right away if you have severe pain in your back, sides, or upper abdomen or have severe pain while urinating. This information is not intended to replace advice given to you by your health care provider. Make sure you discuss any questions you have with your health care provider. Document Released: 07/23/2007 Document Revised: 05/24/2016 Document Reviewed: 05/24/2016   AMBULATORY SURGERY  DISCHARGE INSTRUCTIONS   1) The drugs that you were given will stay in your system until tomorrow so for the next 24 hours you should not:  A) Drive an automobile B) Make any legal decisions C) Drink any alcoholic beverage   2) You may resume regular meals tomorrow.  Today it is better to start with liquids and gradually work up to solid foods.  You may eat anything you prefer, but it is better to start with liquids, then soup and crackers, and gradually work up to solid foods.   3) Please notify your doctor immediately if you have any unusual bleeding, trouble breathing, redness and pain at the surgery site, drainage, fever, or pain not relieved  by medication.   4) Additional Instructions:  [employer] Patient Education   2017 [employer]. [place],

## 2016-12-22 ENCOUNTER — Ambulatory Visit (INDEPENDENT_AMBULATORY_CARE_PROVIDER_SITE_OTHER): Payer: Medicare Other | Admitting: Vascular Surgery

## 2016-12-22 ENCOUNTER — Encounter (INDEPENDENT_AMBULATORY_CARE_PROVIDER_SITE_OTHER): Payer: Self-pay | Admitting: Vascular Surgery

## 2016-12-22 VITALS — BP 131/77 | HR 63 | Resp 17 | Ht 72.0 in | Wt 166.0 lb

## 2016-12-22 DIAGNOSIS — I712 Thoracic aortic aneurysm, without rupture, unspecified: Secondary | ICD-10-CM | POA: Insufficient documentation

## 2016-12-22 DIAGNOSIS — C61 Malignant neoplasm of prostate: Secondary | ICD-10-CM

## 2016-12-22 NOTE — Patient Instructions (Signed)
Thoracic Aortic Aneurysm An aneurysm is a bulge in an artery. It happens when blood pushes up against a weakened or damaged artery wall. A thoracic aortic aneurysm is an aneurysm that occurs in the first part of the aorta, between the heart and the diaphragm. The aorta is the main artery of the body. It supplies blood from the heart to the rest of the body. Some aneurysms may not cause symptoms or problems. However, the major concern with a thoracic aortic aneurysm is that it can enlarge and burst (rupture), or blood can flow between the layers of the wall of the aorta through a tear (aorticdissection). Both of these conditions can cause bleeding inside the body and can be life-threatening if they are not diagnosed and treated right away. What are the causes? The exact cause of this condition is not known. What increases the risk? The following factors may make you more likely to develop this condition:  Being age 65 or older.  Having a hardening of the arteries caused by the buildup of fat and other substances in the lining of a blood vessel (arteriosclerosis).  Having inflammation of the walls of an artery (arteritis).  Having a genetic disease that weakens the body's connective tissue, such as Marfan syndrome.  Having an injury or trauma to the aorta.  Having an infection that is caused by bacteria, such as syphilis or staphylococcus, in the wall of the aorta (infectious aortitis).  Having high blood pressure (hypertension).  Being male.  Being white (Caucasian).  Having high cholesterol.  Having a family history of aneurysms.  Using tobacco.  Having chronic obstructive pulmonary disease (COPD).  What are the signs or symptoms? Symptoms of this condition vary depending on the size and rate of growth of the aneurysm. Most grow slowly and do not cause any symptoms. When symptoms do occur, they may include:  Pain in the chest, back, sides, or abdomen. The pain may vary in  intensity. A sudden onset of severe pain may indicate that the aneurysm has ruptured.  Hoarseness.  Cough.  Shortness of breath.  Swallowing problems.  Swelling in the face, arms, or legs.  Fever.  Unexplained weight loss.  How is this diagnosed? This condition may be diagnosed with:  An ultrasound.  X-rays.  A CT scan.  An MRI.  Tests to check the arteries for damage or blockages (angiogram).  Most unruptured thoracic aortic aneurysms cause no symptoms, so they are often found during exams for other conditions. How is this treated? Treatment for this condition depends on:  The size of the aneurysm.  How fast the aneurysm is growing.  Your age.  Risk factors for rupture.  Aneurysms that are smaller than 2.2 inches (5.5 cm) may be managed by using medicines to control blood pressure, manage pain, or fight infection. You may need regular monitoring to see if the aneurysm is getting bigger. Your health care provider may recommend that you have an ultrasound every year or every 6 months. How often you need to have an ultrasound depends on the size of the aneurysm, how fast it is growing, and whether you have a family history of aneurysms. Surgical repair may be needed if your aneurysm is larger than 2.2 inches or if it is growing quickly. Follow these instructions at home: Eating and drinking  Eat a healthy diet. Your health care provider may recommend that you: ? Lower your salt (sodium) intake. In some people, too much salt can raise blood pressure and increase   the risk of thoracic aortic aneurysm. ? Avoid foods that are high in saturated fat and cholesterol, such as red meat and dairy. ? Eat a diet that is low in sugar. ? Increase your fiber intake by including whole grains, vegetables, and fruits in your diet. Eating these foods may help to lower blood pressure.  Limit or avoid alcohol as recommended by your health care provider. Lifestyle  Follow instructions  from your health care provider about healthy lifestyle habits. Your health care provider may recommend that you: ? Do not use any products that contain nicotine or tobacco, such as cigarettes and e-cigarettes. If you need help quitting, ask your health care provider. ? Keep your blood pressure within normal limits. The target limit for most people is below 120/80. Check your blood pressure regularly. If it is high, ask your health care provider about ways that you can control it. ? Keep your blood sugar (glucose) level and cholesterol levels within normal limits. Target limits for most people are:  Blood glucose level: Less than 100 mg/dL.  Total cholesterol level: Less than 200 mg/dL. ? Maintain a healthy weight. Activity  Stay physically active and exercise regularly. Talk with your health care provider about how often you should exercise and ask which types of exercise are safe for you.  Avoid heavy lifting and activities that take a lot of effort (are strenuous). Ask your health care provider what activities are safe for you. General instructions  Keep all follow-up visits as told by your health care provider. This is important. ? Talk with your health care provider about regular screenings to see if the aneurysm is getting bigger.  Take over-the-counter and prescription medicines only as told by your health care provider. Contact a health care provider if:  You have discomfort in your upper back, neck, or abdomen.  You have trouble swallowing.  You have a cough or hoarseness.  You have a family history of aneurysms.  You have unexplained weight loss. Get help right away if:  You have sudden, severe pain in your upper back and abdomen. This pain may move into your chest and arms.  You have shortness of breath.  You have a fever. This information is not intended to replace advice given to you by your health care provider. Make sure you discuss any questions you have with your  health care provider. Document Released: 07/03/2005 Document Revised: 04/14/2016 Document Reviewed: 04/14/2016 Elsevier Interactive Patient Education  2018 [employer] Inc[employer]  

## 2016-12-22 NOTE — Assessment & Plan Note (Signed)
Is on multiple medications including investigational drugs. He is artery getting CT scans of the chest abdomen and pelvis every 3 months. He will need his aneurysm followed on an annual basis, but a new CT scan will not be necessary.

## 2016-12-22 NOTE — Progress Notes (Signed)
Patient ID: ACESON LABELL, male   DOB: [birth year], 66 y.o.   MRN: [id number]  Chief Complaint  Patient presents with  . AAA    New issue    HPI William Wright is a 66 y.o. male.  I am asked to see the patient by William Wright for evaluation of thoracic aortic aneurysm.  The patient reports getting CT scans of the chest abdomen and pelvis every 3 months for evaluation of his urologic cancer. He has been treated for aggressive prostate cancer but is doing well. On a recent CT scan of the chest abdomen and pelvis, it was noted that he had an approximately 4.5 cm ascending thoracic aortic aneurysm. I have independently reviewed the CT scan. There is no evidence of extravasation, dissection, or complicating factor of this aneurysm. He denies chest pain, back pain, signs of peripheral embolization, or other symptoms of the aneurysm.   Past Medical History:  Diagnosis Date  . Cancer of prostate (William Wright) 11/18/2014  . Prostate cancer [facility])     Past Surgical History:  Procedure Laterality Date  . EXTRACORPOREAL SHOCK WAVE LITHOTRIPSY Left 05/20/2015   Procedure: EXTRACORPOREAL SHOCK WAVE LITHOTRIPSY (ESWL);  Surgeon: Royston Cowper, MD;  Location: [facility] ORS;  Service: Urology;  Laterality: Left;  . EXTRACORPOREAL SHOCK WAVE LITHOTRIPSY Right 12/21/2016   Procedure: EXTRACORPOREAL SHOCK WAVE LITHOTRIPSY (ESWL);  Surgeon: Royston Cowper, MD;  Location: [facility] ORS;  Service: Urology;  Laterality: Right;    Family history No bleeding disorders, clotting disorders, autoimmune diseases, or porphyria  Social History Social History  Substance Use Topics  . Smoking status: Never Smoker  . Smokeless tobacco: Never Used  . Alcohol use No  No IV drug use  Allergies  Allergen Reactions  . No Known Allergies     Current Outpatient Prescriptions  Medication Sig Dispense Refill  . abiraterone Acetate (ZYTIGA) 250 MG tablet Take 4 tablets (1,000 mg total) by mouth daily. Take on an empty stomach 1 hour  before or 2 hours after a meal 120 tablet 4  . Investigational enzalutamide 40 MG capsule [facility]ANCE [id number] Take 4 capsules (160 mg total) by mouth daily. Take with or without food.Swallow whole. Do not crush, or open capsules. 120 capsule 0  . Investigational enzalutamide 40 MG capsule [facility]IANCE [id number] Take 4 capsules (160 mg total) by mouth daily. Take with or without food.Swallow whole. Do not crush, or open capsules. 120 capsule 0  . Investigational enzalutamide 40 MG capsule [facility]IANCE [id number] Take 4 capsules (160 mg total) by mouth daily. Take with or without food.Swallow whole. Do not crush, or open capsules. 120 capsule 0  . Investigational enzalutamide 40 MG capsule [facility]IANCE [id number] Take 4 capsules (160 mg total) by mouth daily. Take with or without food.Swallow whole. Do not crush, or open capsules. 120 capsule 0  . Investigational enzalutamide 40 MG capsule [facility]IANCE [id number] Take 4 capsules (160 mg total) by mouth daily. Take with or without food.Swallow whole. Do not crush, or open capsules. 120 capsule 0  . levofloxacin (LEVAQUIN) 500 MG tablet Take 1 tablet (500 mg total) by mouth daily. 7 tablet 0  . NUCYNTA 50 MG tablet Take 1 tablet (50 mg total) by mouth every 6 (six) hours as needed for moderate pain. 1 TO 2 TABS Q 6 HOURS PRN PAIN 30 tablet 0  . ondansetron (ZOFRAN ODT) 8 MG disintegrating tablet Take 1 tablet (8 mg total) by mouth every 6 (six) hours as needed for nausea or vomiting. 4  tablet 3  . predniSONE (DELTASONE) 5 MG tablet Take 1 tablet (5 mg total) by mouth 2 (two) times daily. 60 tablet 0  . tamsulosin (FLOMAX) 0.4 MG CAPS capsule Take 1 capsule (0.4 mg total) by mouth daily. 30 capsule 11  . Triptorelin Pamoate (TRELSTAR) 22.5 MG injection Inject 22.5 mg into the muscle every 6 (six) months.    . valACYclovir (VALTREX) 1000 MG tablet Take 1 tablet (1,000 mg total) by mouth 2 (two) times daily. 60 tablet 6  . venlafaxine XR (EFFEXOR-XR) 37.5 MG 24 hr capsule Take 1 capsule  (37.5 mg total) by mouth daily with breakfast. 30 capsule 4  . diphenhydrAMINE (BENADRYL) 25 mg capsule Take 1 capsule (25 mg total) by mouth once. 30 capsule 0   No current facility-administered medications for this visit.       REVIEW OF SYSTEMS (Negative unless checked)  Constitutional: _0 Weight loss  _1 Fever  _2 Chills Cardiac: _3 Chest pain   _4 Chest pressure   _5 Palpitations   _6 Shortness of breath when laying flat   _7 Shortness of breath at rest   _8 Shortness of breath with exertion. Vascular:  _9 Pain in legs with walking   _10 Pain in legs at rest   _11 Pain in legs when laying flat   _12 Claudication   _13 Pain in feet when walking  _14 Pain in feet at rest  _15 Pain in feet when laying flat   _16 History of DVT   _17 Phlebitis   _18 Swelling in legs   _19 Varicose veins   _20 Non-healing ulcers Pulmonary:   _21 Uses home oxygen   _22 Productive cough   _23 Hemoptysis   _24 Wheeze  _25 COPD   _26 Asthma Neurologic:  _27 Dizziness  _28 Blackouts   _29 Seizures   _30 History of stroke   _31 History of TIA  _32 Aphasia   _33 Temporary blindness   _34 Dysphagia   _35 Weakness or numbness in arms   _36 Weakness or numbness in legs Musculoskeletal:  _37 Arthritis   _38 Joint swelling   _39 Joint pain   _40 Low back pain Hematologic:  _41 Easy bruising  _42 Easy bleeding   _43 Hypercoagulable state   _44 Anemic  _45 Hepatitis Gastrointestinal:  _46 Blood in stool   _47 Vomiting blood  _48 Gastroesophageal reflux/heartburn   _49 Abdominal pain Genitourinary:  _50 Chronic kidney disease   _51 Difficult urination  _52 Frequent urination  _53 Burning with urination   _54 Hematuria Skin:  _55 Rashes   _56 Ulcers   _57 Wounds Psychological:  _58 History of anxiety   _59  History of major depression.    Physical Exam BP 131/77 (BP Location: Right Arm)   Pulse 63   Resp 17   Ht 6' (1.829 m)   Wt 166 lb (75.3 kg)   BMI 22.51 kg/m  Gen:  WD/WN, NAD Head: [place]/AT, No temporalis wasting.  Ear/Nose/Throat: Hearing grossly intact, nares w/o erythema or drainage, oropharynx w/o  Erythema/Exudate Eyes: Conjunctiva clear, sclera non-icteric  Neck: trachea midline.  No bruit or JVD.  Pulmonary:  Good air movement, clear to auscultation bilaterally.  Cardiac: RRR, soft systolic murmur Vascular:  Vessel Right Left  Radial Palpable Palpable  Ulnar Palpable Palpable  Brachial Palpable Palpable  Carotid Palpable, without bruit Palpable, without bruit  Aorta Not palpable N/A  Femoral Palpable Palpable  Popliteal Palpable Palpable  PT 1+ Palpable 1+ Palpable  DP Palpable Palpable   Gastrointestinal: soft, non-tender/non-distended. Musculoskeletal: M/S 5/5 throughout.  Extremities without ischemic changes.  No deformity or atrophy. 1+ BLE edema. Neurologic: Sensation grossly intact in extremities.  Symmetrical.  Speech is fluent. Motor exam as listed above. Psychiatric: Judgment intact, Mood & affect appropriate for pt's clinical situation. Dermatologic: No rashes or  ulcers noted.  No cellulitis or open wounds.    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 EXAM: CT CHEST, ABDOMEN, AND PELVIS WITH CONTRAST TECHNIQUE: Multidetector CT imaging of the chest, abdomen and pelvis was performed following the standard protocol during bolus administration of intravenous contrast. CONTRAST:  180m ISOVUE-300 IOPAMIDOL (ISOVUE-300) INJECTION 61% COMPARISON:  09/22/2016 CT chest, abdomen and pelvis. FINDINGS: RECIST 1.1 Target Lesions: 1. Mediastinal right retrocrural lymph node: 2.6 cm (series 2/image 49), previously 2.6 cm 2. Left periaortic lymph node: 1.3 cm (series 2/ image 29), previously 1.3 cm Non-target Lesions: 1. Lymph node superior to pancreas: 1.2 cm (series 2/ image 57), previously 1.2 cm 2. Portacaval lymph node: 1.2 cm (series 2/ image 59), previously 1.2 cm CT CHEST FINDINGS Cardiovascular: Normal heart size. No significant pericardial fluid/thickening.  Left anterior descending coronary atherosclerosis. Ascending thoracic aortic 4.5 cm aneurysm (series 2/ image 27), stable. Normal caliber pulmonary arteries. No central pulmonary emboli. Mediastinum/Nodes: No discrete thyroid nodules. Unremarkable esophagus. No pathologically enlarged axillary, mediastinal or hilar lymph nodes. Right para-aortic posterior mediastinal node measures 0.6 cm short axis (series 2/ image 29), unchanged and within normal limits. Right retrocrural node measures 0.7 cm short axis (series 2/ image 49), unchanged and within normal limits. Lungs/Pleura: No pneumothorax. No pleural effusion. No acute consolidative airspace disease, lung masses or significant pulmonary nodules. Scattered parenchymal bands in the mid to lower lungs bilaterally are unchanged and compatible with mild postinfectious/ postinflammatory scarring. Musculoskeletal: Stable sclerotic lesions in the right posterior eighth rib and left posterior third and left posterior seventh ribs. Stable sclerotic upper right scapular lesion . Stable sclerotic osseous lesion replacing the T6 vertebral body with associated chronic mild pathologic fracture. Stable sclerotic T7, T10 and T12 vertebral lesions. Mild thoracic spondylosis. No new thoracic osseous lesions. CT ABDOMEN PELVIS FINDINGS Hepatobiliary: Normal liver with no liver mass. Normal gallbladder with no radiopaque cholelithiasis. No biliary ductal dilatation. Pancreas: Normal, with no mass or duct dilation. Spleen: Normal size. No mass. Adrenals/Urinary Tract: Normal adrenals. Right ureteropelvic junction 5 mm stone with minimal right hydronephrosis. No left hydronephrosis. No renal mass. Collapsed and grossly normal bladder. Stomach/Bowel: Grossly normal stomach. Normal caliber small bowel with no small bowel wall thickening. Normal appendix. Moderate sigmoid diverticulosis, with no large bowel wall thickening or pericolonic fat stranding. Vascular/Lymphatic: Normal caliber  abdominal aorta. Patent portal, splenic, hepatic and renal veins. No pathologically enlarged lymph nodes in the abdomen or pelvis. Top-normal size left para-aortic nodes are stable. Reproductive: Normal size prostate with nonspecific internal prostatic calcifications. Other: No pneumoperitoneum, ascites or focal fluid collection. Musculoskeletal: Sclerotic osseous lesions throughout the L2, L3, L4 and L5 vertebral levels, in the superior right iliac bone and left ischium are unchanged. No new focal osseous lesions. Mild lumbar spondylosis. IMPRESSION: 1. Recist findings as above. 2. No new or progressive metastatic disease. 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 No pathologically enlarged thoracic or abdominopelvic nodes. 3. Right UPJ 5 mm stone with minimal right hydronephrosis. 4. Stable 4.5 cm ascending thoracic aortic aneurysm. Ascending thoracic aortic aneurysm. Recommend semi-annual imaging followup by CTA or MRA and referral to cardiothoracic surgery if not already obtained. This recommendation follows 2010 ACCF/AHA/AATS/ACR/ASA/SCA/SCAI/SIR/STS/SVM Guidelines for the Diagnosis and Management of Patients With Thoracic Aortic Disease. Circulation. 2010; 121:: D638-V564 Electronically Signed   By: JIlona SorrelM.D.   On: 12/14/2016 16:21   Nm Bone Scan Whole Body  Result Date: 12/14/2016 CLINICAL DATA:  Prostate cancer. Multiple sites of metastasis. Research study EXAM: NUCLEAR MEDICINE WHOLE BODY BONE SCAN TECHNIQUE: Whole body anterior and posterior images were obtained approximately 3 hours after intravenous injection of radiopharmaceutical. RADIOPHARMACEUTICALS:  22.4 mCi Technetium-32mMDP IV COMPARISON:  Bone scan 09/22/2016 PCWG2 Measurements for Bone Metastasis: (baseline 10/21/2014) 1. New lesion in the posterior medial LEFT sixth rib. FINDINGS: Several foci of skeletal metastasis again demonstrated including the RIGHT scapula, T12 vertebral body, L2 vertebral body and L  3 vertebral body. New lesion in the medial aspect of the posterior LEFT sixth rib. IMPRESSION: 1. New lesion in the posteromedial LEFT sixth rib. Concern for metastatic lesion. Recommend attention on follow-up. 2. Stable additional metastatic lesions. Electronically Signed   By: SSuzy BouchardM.D.   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 EXAM: CT CHEST, ABDOMEN, AND PELVIS WITH CONTRAST TECHNIQUE: Multidetector CT imaging of the chest, abdomen and pelvis was performed following the standard protocol during bolus administration of intravenous contrast. CONTRAST:  1024mISOVUE-300 IOPAMIDOL (ISOVUE-300) INJECTION 61% COMPARISON:  09/22/2016 CT chest, abdomen and pelvis. FINDINGS: RECIST 1.1 Target Lesions: 1. Mediastinal right retrocrural lymph node: 2.6 cm (series 2/image 49), previously 2.6 cm 2. Left periaortic lymph node: 1.3 cm (series 2/ image 29), previously 1.3 cm Non-target Lesions: 1. Lymph node superior to pancreas: 1.2 cm (series 2/ image 57), previously 1.2 cm 2. Portacaval lymph node: 1.2 cm (series 2/ image 59), previously 1.2 cm CT CHEST FINDINGS Cardiovascular: Normal heart size. No significant pericardial fluid/thickening. Left anterior descending coronary atherosclerosis. Ascending thoracic aortic 4.5 cm aneurysm (series 2/ image 27), stable. Normal caliber pulmonary arteries. No central pulmonary emboli. Mediastinum/Nodes: No discrete thyroid nodules. Unremarkable esophagus. No pathologically enlarged axillary, mediastinal or hilar lymph nodes. Right para-aortic posterior mediastinal node measures 0.6 cm short axis (series 2/ image 29), unchanged and within normal limits. Right retrocrural node measures 0.7 cm short axis (series 2/ image 49), unchanged and within normal limits. Lungs/Pleura: No pneumothorax. No pleural effusion. No acute  consolidative airspace disease, lung masses or significant pulmonary nodules. Scattered parenchymal bands in the mid to lower lungs bilaterally are unchanged and compatible with mild postinfectious/ postinflammatory scarring. Musculoskeletal: Stable sclerotic lesions in the right posterior eighth rib and left posterior third and left posterior seventh ribs. Stable sclerotic upper right scapular lesion . Stable sclerotic osseous lesion replacing the T6 vertebral body with associated chronic mild pathologic fracture. Stable sclerotic T7, T10 and T12 vertebral lesions. Mild thoracic spondylosis. No new thoracic osseous lesions. CT ABDOMEN PELVIS FINDINGS Hepatobiliary: Normal liver with no liver mass. Normal gallbladder with no radiopaque cholelithiasis. No biliary ductal dilatation. Pancreas: Normal, with no mass or duct dilation. Spleen: Normal size. No mass. Adrenals/Urinary Tract: Normal adrenals. Right ureteropelvic junction 5 mm stone with minimal right hydronephrosis. No left hydronephrosis. No renal mass. Collapsed and grossly normal bladder. Stomach/Bowel: Grossly normal stomach. Normal caliber small bowel with no small bowel wall thickening. Normal appendix. Moderate sigmoid diverticulosis, with no large bowel wall thickening or pericolonic fat stranding. Vascular/Lymphatic: Normal caliber abdominal aorta. Patent portal, splenic, hepatic and renal veins. No pathologically enlarged lymph nodes in the abdomen or pelvis. Top-normal size left para-aortic nodes are stable. Reproductive: Normal size prostate with nonspecific internal prostatic calcifications. Other: No pneumoperitoneum, ascites or focal fluid collection. Musculoskeletal: Sclerotic osseous lesions throughout the L2, L3, L4 and L5  vertebral levels, in the superior right iliac bone and left ischium are unchanged. No new focal osseous lesions. Mild lumbar spondylosis. IMPRESSION: 1. Recist findings as above. 2. No new or progressive metastatic disease.  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 No pathologically enlarged thoracic or abdominopelvic nodes. 3. Right UPJ 5 mm stone with minimal right hydronephrosis. 4. Stable 4.5 cm ascending thoracic aortic aneurysm. Ascending thoracic aortic aneurysm. Recommend semi-annual imaging followup by CTA or MRA and referral to cardiothoracic surgery if not already obtained. This recommendation follows 2010 ACCF/AHA/AATS/ACR/ASA/SCA/SCAI/SIR/STS/SVM Guidelines for the Diagnosis and Management of Patients With Thoracic Aortic Disease. Circulation. 2010; 121: L753-Y051. Electronically Signed   By: Ilona Sorrel M.D.   On: 12/14/2016 16:21    Labs Recent Results (from the past 2160 hour(s))  CBC with Differential     Status: Abnormal   Collection Time: 09/27/16  9:45 AM  Result Value Ref Range   WBC 5.1 3.8 - 10.6 K/uL   RBC 4.34 (L) 4.40 - 5.90 MIL/uL   Hemoglobin 14.6 13.0 - 18.0 g/dL   HCT 42.2 40.0 - 52.0 %   MCV 97.2 80.0 - 100.0 fL   MCH 33.7 26.0 - 34.0 pg   MCHC 34.7 32.0 - 36.0 g/dL   RDW 13.9 11.5 - 14.5 %   Platelets 214 150 - 440 K/uL   Neutrophils Relative % 56 %   Neutro Abs 2.9 1.4 - 6.5 K/uL   Lymphocytes Relative 32 %   Lymphs Abs 1.6 1.0 - 3.6 K/uL   Monocytes Relative 10 %   Monocytes Absolute 0.5 0.2 - 1.0 K/uL   Eosinophils Relative 1 %   Eosinophils Absolute 0.1 0 - 0.7 K/uL   Basophils Relative 1 %   Basophils Absolute 0.0 0 - 0.1 K/uL  Comprehensive metabolic panel     Status: Abnormal   Collection Time: 09/27/16  9:45 AM  Result Value Ref Range   Sodium 138 135 - 145 mmol/L   Potassium 4.3 3.5 - 5.1 mmol/L   Chloride 103 101 - 111 mmol/L   CO2 27 22 - 32 mmol/L   Glucose, Bld 101 (H) 65 - 99 mg/dL   BUN 20 6 - 20 mg/dL   Creatinine, Ser 0.98 0.61 - 1.24 mg/dL   Calcium 9.4 8.9 - 10.3 mg/dL   Total Protein 7.4 6.5 - 8.1 g/dL   Albumin 4.4 3.5 - 5.0 g/dL   AST 18 15 - 41 U/L   ALT 13 (L) 17 - 63 U/L   Alkaline Phosphatase 49 38 - 126 U/L    Total Bilirubin 1.1 0.3 - 1.2 mg/dL   GFR calc non Af Amer >60 >60 mL/min   GFR calc Af Amer >60 >60 mL/min    Comment: (NOTE) The eGFR has been calculated using the CKD EPI equation. This calculation has not been validated in all clinical situations. eGFR's persistently <60 mL/min signify possible Chronic Kidney Disease.    Anion gap 8 5 - 15  PSA     Status: Abnormal   Collection Time: 09/27/16  9:45 AM  Result Value Ref Range   PSA 12.23 (H) 0.00 - 4.00 ng/mL    Comment: (NOTE) While PSA levels of <=4.0 ng/ml are reported as reference range, some men with levels below 4.0 ng/ml can have prostate cancer and many men with PSA above 4.0 ng/ml do not have prostate cancer.  Other tests such as free PSA, age specific reference ranges, PSA velocity and PSA doubling time may  be helpful especially in men less than 39 years old. Performed at [facility], [place] [address] [place], [place] [address]   CBC with Differential/Platelet     Status: Abnormal   Collection Time: 10/25/16  9:04 AM  Result Value Ref Range   WBC 4.8 3.8 - 10.6 K/uL   RBC 4.29 (L) 4.40 - 5.90 MIL/uL   Hemoglobin 14.5 13.0 - 18.0 g/dL   HCT 41.1 40.0 - 52.0 %   MCV 96.0 80.0 - 100.0 fL   MCH 33.8 26.0 - 34.0 pg   MCHC 35.2 32.0 - 36.0 g/dL   RDW 13.8 11.5 - 14.5 %   Platelets 216 150 - 440 K/uL   Neutrophils Relative % 55 %   Neutro Abs 2.7 1.4 - 6.5 K/uL   Lymphocytes Relative 32 %   Lymphs Abs 1.6 1.0 - 3.6 K/uL   Monocytes Relative 11 %   Monocytes Absolute 0.5 0.2 - 1.0 K/uL   Eosinophils Relative 1 %   Eosinophils Absolute 0.1 0 - 0.7 K/uL   Basophils Relative 1 %   Basophils Absolute 0.0 0 - 0.1 K/uL  Comprehensive metabolic panel     Status: Abnormal   Collection Time: 10/25/16  9:04 AM  Result Value Ref Range   Sodium 138 135 - 145 mmol/L   Potassium 4.1 3.5 - 5.1 mmol/L   Chloride 106 101 - 111 mmol/L   CO2 26 22 - 32 mmol/L   Glucose, Bld 94 65 - 99 mg/dL   BUN 18 6 - 20 mg/dL    Creatinine, Ser 0.89 0.61 - 1.24 mg/dL   Calcium 9.4 8.9 - 10.3 mg/dL   Total Protein 7.3 6.5 - 8.1 g/dL   Albumin 4.2 3.5 - 5.0 g/dL   AST 17 15 - 41 U/L   ALT 12 (L) 17 - 63 U/L   Alkaline Phosphatase 49 38 - 126 U/L   Total Bilirubin 0.9 0.3 - 1.2 mg/dL   GFR calc non Af Amer >60 >60 mL/min   GFR calc Af Amer >60 >60 mL/min    Comment: (NOTE) The eGFR has been calculated using the CKD EPI equation. This calculation has not been validated in all clinical situations. eGFR's persistently <60 mL/min signify possible Chronic Kidney Disease.    Anion gap 6 5 - 15  PSA     Status: Abnormal   Collection Time: 10/25/16  9:04 AM  Result Value Ref Range   PSA 9.49 (H) 0.00 - 4.00 ng/mL    Comment: (NOTE) While PSA levels of <=4.0 ng/ml are reported as reference range, some men with levels below 4.0 ng/ml can have prostate cancer and many men with PSA above 4.0 ng/ml do not have prostate cancer.  Other tests such as free PSA, age specific reference ranges, PSA velocity and PSA doubling time may be helpful especially in men less than 66 years old. Performed at [facility], [place] [address] [place], [place] [address]   CBC with Differential     Status: Abnormal   Collection Time: 11/23/16  9:28 AM  Result Value Ref Range   WBC 3.5 (L) 3.8 - 10.6 K/uL   RBC 4.23 (L) 4.40 - 5.90 MIL/uL   Hemoglobin 14.4 13.0 - 18.0 g/dL   HCT 41.2 40.0 - 52.0 %   MCV 97.3 80.0 - 100.0 fL   MCH 34.0 26.0 - 34.0 pg   MCHC 34.9 32.0 - 36.0 g/dL   RDW 13.9 11.5 - 14.5 %  Platelets 208 150 - 440 K/uL   Neutrophils Relative % 37 %   Neutro Abs 1.3 (L) 1.4 - 6.5 K/uL   Lymphocytes Relative 43 %   Lymphs Abs 1.5 1.0 - 3.6 K/uL   Monocytes Relative 15 %   Monocytes Absolute 0.5 0.2 - 1.0 K/uL   Eosinophils Relative 4 %   Eosinophils Absolute 0.1 0 - 0.7 K/uL   Basophils Relative 1 %   Basophils Absolute 0.0 0 - 0.1 K/uL  Comprehensive metabolic panel     Status: Abnormal   Collection Time:  11/23/16  9:28 AM  Result Value Ref Range   Sodium 138 135 - 145 mmol/L   Potassium 3.9 3.5 - 5.1 mmol/L   Chloride 104 101 - 111 mmol/L   CO2 28 22 - 32 mmol/L   Glucose, Bld 80 65 - 99 mg/dL   BUN 22 (H) 6 - 20 mg/dL   Creatinine, Ser 0.89 0.61 - 1.24 mg/dL   Calcium 9.3 8.9 - 10.3 mg/dL   Total Protein 7.1 6.5 - 8.1 g/dL   Albumin 4.2 3.5 - 5.0 g/dL   AST 17 15 - 41 U/L   ALT 12 (L) 17 - 63 U/L   Alkaline Phosphatase 52 38 - 126 U/L   Total Bilirubin 1.0 0.3 - 1.2 mg/dL   GFR calc non Af Amer >60 >60 mL/min   GFR calc Af Amer >60 >60 mL/min    Comment: (NOTE) The eGFR has been calculated using the CKD EPI equation. This calculation has not been validated in all clinical situations. eGFR's persistently <60 mL/min signify possible Chronic Kidney Disease.    Anion gap 6 5 - 15  PSA     Status: Abnormal   Collection Time: 11/23/16  9:28 AM  Result Value Ref Range   PSA 9.29 (H) 0.00 - 4.00 ng/mL    Comment: (NOTE) While PSA levels of <=4.0 ng/ml are reported as reference range, some men with levels below 4.0 ng/ml can have prostate cancer and many men with PSA above 4.0 ng/ml do not have prostate cancer.  Other tests such as free PSA, age specific reference ranges, PSA velocity and PSA doubling time may be helpful especially in men less than 45 years old. Performed at [facility], [place] [address], [place], [place] [address]   CBC with Differential     Status: Abnormal   Collection Time: 12/20/16  1:29 PM  Result Value Ref Range   WBC 5.6 3.8 - 10.6 K/uL   RBC 3.94 (L) 4.40 - 5.90 MIL/uL   Hemoglobin 13.4 13.0 - 18.0 g/dL   HCT 37.9 (L) 40.0 - 52.0 %   MCV 96.3 80.0 - 100.0 fL   MCH 33.9 26.0 - 34.0 pg   MCHC 35.3 32.0 - 36.0 g/dL   RDW 13.2 11.5 - 14.5 %   Platelets 226 150 - 440 K/uL   Neutrophils Relative % 71 %   Neutro Abs 4.0 1.4 - 6.5 K/uL   Lymphocytes Relative 18 %   Lymphs Abs 1.0 1.0 - 3.6 K/uL   Monocytes Relative 9 %   Monocytes Absolute 0.5  0.2 - 1.0 K/uL   Eosinophils Relative 1 %   Eosinophils Absolute 0.0 0 - 0.7 K/uL   Basophils Relative 1 %   Basophils Absolute 0.0 0 - 0.1 K/uL  Comprehensive metabolic panel     Status: Abnormal   Collection Time: 12/20/16  1:29 PM  Result Value Ref Range   Sodium  135 135 - 145 mmol/L   Potassium 4.7 3.5 - 5.1 mmol/L   Chloride 102 101 - 111 mmol/L   CO2 25 22 - 32 mmol/L   Glucose, Bld 110 (H) 65 - 99 mg/dL   BUN 29 (H) 6 - 20 mg/dL   Creatinine, Ser 1.09 0.61 - 1.24 mg/dL   Calcium 9.4 8.9 - 10.3 mg/dL   Total Protein 7.0 6.5 - 8.1 g/dL   Albumin 3.9 3.5 - 5.0 g/dL   AST 11 (L) 15 - 41 U/L   ALT 8 (L) 17 - 63 U/L   Alkaline Phosphatase 65 38 - 126 U/L   Total Bilirubin 0.8 0.3 - 1.2 mg/dL   GFR calc non Af Amer >60 >60 mL/min   GFR calc Af Amer >60 >60 mL/min    Comment: (NOTE) The eGFR has been calculated using the CKD EPI equation. This calculation has not been validated in all clinical situations. eGFR's persistently <60 mL/min signify possible Chronic Kidney Disease.    Anion gap 8 5 - 15    Assessment/Plan:  Prostate cancer metastatic to multiple sites [facility] Is on multiple medications including investigational drugs. He is artery getting CT scans of the chest abdomen and pelvis every 3 months. He will need his aneurysm followed on an annual basis, but a new CT scan will not be necessary.  Thoracic aortic aneurysm without rupture [facility]) The patient has a 4.5 cm ascending thoracic aortic aneurysm. We've had a long talk today about the natural history and pathophysiology of thoracic aortic aneurysms. This is a reasonably small to moderate size thoracic aortic aneurysm and well below the threshold for prophylactic repair. I would recommend annual surveillance with CT scan. Duplex is not able to follow thoracic aneurysms. Given his investigational cancer study, he is already getting CT scans more frequently than [name] certainly reasonable. I will just see him on an annual  basis to review the results and we will shorten the follow-up if there is significant growth. The patient voices his understanding and is agreeable to proceed. Avoidance of tobacco and blood pressure control importance were stressed.      Leotis Pain 12/22/2016, 4:11 PM   This note was created with Dragon medical transcription system.  Any errors from dictation are unintentional.

## 2016-12-22 NOTE — Assessment & Plan Note (Signed)
The patient has a 4.5 cm ascending thoracic aortic aneurysm. We've had a long talk today about the natural history and pathophysiology of thoracic aortic aneurysms. This is a reasonably small to moderate size thoracic aortic aneurysm and well below the threshold for prophylactic repair. I would recommend annual surveillance with CT scan. Duplex is not able to follow thoracic aneurysms. Given his investigational cancer study, he is already getting CT scans more frequently than [name] certainly reasonable. I will just see him on an annual basis to review the results and we will shorten the follow-up if there is significant growth. The patient voices his understanding and is agreeable to proceed. Avoidance of tobacco and blood pressure control importance were stressed.

## 2016-12-25 ENCOUNTER — Encounter: Payer: Self-pay | Admitting: *Deleted

## 2016-12-28 DIAGNOSIS — N201 Calculus of ureter: Secondary | ICD-10-CM | POA: Diagnosis not present

## 2016-12-28 NOTE — Progress Notes (Signed)
T/C made to [facility] lab to check on results of patient's PSA, which was collected on 12/20/16. Per lab staff in [place], the PSA has resulted in [place] and is 13.26 ng/mL. Informed lab staff that the results have not been entered into the EPIC system yet, and she states this should occur automatically. [place] lab also notified of this. [name], BSN, MHA, OCN 12/28/2016 9:32 AM

## 2017-01-18 ENCOUNTER — Inpatient Hospital Stay: Payer: Medicare Other | Attending: Internal Medicine | Admitting: *Deleted

## 2017-01-18 ENCOUNTER — Inpatient Hospital Stay (HOSPITAL_BASED_OUTPATIENT_CLINIC_OR_DEPARTMENT_OTHER): Payer: Medicare Other | Admitting: Internal Medicine

## 2017-01-18 ENCOUNTER — Other Ambulatory Visit: Payer: Self-pay | Admitting: *Deleted

## 2017-01-18 ENCOUNTER — Encounter: Payer: Self-pay | Admitting: *Deleted

## 2017-01-18 ENCOUNTER — Inpatient Hospital Stay: Payer: Medicare Other

## 2017-01-18 VITALS — BP 123/72 | HR 63 | Temp 98.6°F | Resp 20 | Ht 72.0 in | Wt 167.0 lb

## 2017-01-18 DIAGNOSIS — C61 Malignant neoplasm of prostate: Secondary | ICD-10-CM

## 2017-01-18 DIAGNOSIS — C7951 Secondary malignant neoplasm of bone: Secondary | ICD-10-CM | POA: Insufficient documentation

## 2017-01-18 DIAGNOSIS — Z006 Encounter for examination for normal comparison and control in clinical research program: Secondary | ICD-10-CM

## 2017-01-18 DIAGNOSIS — Z79899 Other long term (current) drug therapy: Secondary | ICD-10-CM | POA: Diagnosis not present

## 2017-01-18 LAB — COMPREHENSIVE METABOLIC PANEL
ALT: 11 U/L — AB (ref 17–63)
AST: 15 U/L (ref 15–41)
Albumin: 4.2 g/dL (ref 3.5–5.0)
Alkaline Phosphatase: 51 U/L (ref 38–126)
Anion gap: 7 (ref 5–15)
BUN: 30 mg/dL — ABNORMAL HIGH (ref 6–20)
CHLORIDE: 104 mmol/L (ref 101–111)
CO2: 27 mmol/L (ref 22–32)
CREATININE: 0.98 mg/dL (ref 0.61–1.24)
Calcium: 9.1 mg/dL (ref 8.9–10.3)
GFR calc non Af Amer: >60 mL/min (ref >60)
Glucose, Bld: 93 mg/dL (ref 65–99)
POTASSIUM: 4.2 mmol/L (ref 3.5–5.1)
SODIUM: 138 mmol/L (ref 135–145)
Total Bilirubin: 0.9 mg/dL (ref 0.3–1.2)
Total Protein: 6.9 g/dL (ref 6.5–8.1)

## 2017-01-18 LAB — CBC WITH DIFFERENTIAL/PLATELET
Basophils Absolute: 0 10*3/uL (ref 0–0.1)
Basophils Relative: 1 %
EOS ABS: 0.1 10*3/uL (ref 0–0.7)
Eosinophils Relative: 2 %
HCT: 39.8 % — ABNORMAL LOW (ref 40.0–52.0)
HEMOGLOBIN: 14.1 g/dL (ref 13.0–18.0)
Lymphocytes Relative: 41 %
Lymphs Abs: 1.5 10*3/uL (ref 1.0–3.6)
MCH: 34.3 pg — ABNORMAL HIGH (ref 26.0–34.0)
MCHC: 35.5 g/dL (ref 32.0–36.0)
MCV: 96.6 fL (ref 80.0–100.0)
Monocytes Absolute: 0.5 10*3/uL (ref 0.2–1.0)
Monocytes Relative: 14 %
NEUTROS PCT: 42 %
Neutro Abs: 1.6 10*3/uL (ref 1.4–6.5)
Platelets: 182 10*3/uL (ref 150–440)
RBC: 4.12 MIL/uL — AB (ref 4.40–5.90)
RDW: 13.4 % (ref 11.5–14.5)
WBC: 3.7 10*3/uL — AB (ref 3.8–10.6)

## 2017-01-18 LAB — PSA
PROSTATIC SPECIFIC ANTIGEN: 13.26 ng/mL — AB (ref 0.00–4.00)
PROSTATIC SPECIFIC ANTIGEN: 10.46 ng/mL — AB (ref 0.00–4.00)

## 2017-01-18 MED ORDER — INV-ENZALUTAMIDE 40 MG CAPS #120 ALLIANCE A031201: 160.0000 mg | ORAL_CAPSULE | Freq: Every day | ORAL | 0 refills | Status: DC

## 2017-01-18 MED ORDER — DENOSUMAB 120 MG/1.7ML ~~LOC~~ SOLN
120.0000 mg | Freq: Once | SUBCUTANEOUS | Status: AC
  Administered 2017-01-18: 120 mg via SUBCUTANEOUS

## 2017-01-18 NOTE — Progress Notes (Signed)
Pt reports 1 episode of right sided flank pain on Friday last week.  "It felt like a kidney stone, but the pain didn't last long."

## 2017-01-18 NOTE — Assessment & Plan Note (Addendum)
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 Also on Zytiga plus Xtandi- as per clinical trial. MAY 31st PSA 12.May 2018- CT scan C/A/P- stable disease-lymphadenopathy in the pelvis and retroperitoneum; bone scan - new 6th rib. PSA- pending. Trelstar due in July 2018.   # Patient tolerating treatment very well- except for hot flashes/Continue same treatments.  No obvious signs of clinical progression.  # Hot flashes- G-1-  On Effexor improved.  # Bone lesions continue Xgeva; no side effects noted. Continue calcium and vitamin D.  # follow up in 4 weeks/x-geva/labs.

## 2017-01-18 NOTE — Progress Notes (Signed)
William Wright returns to clinic today for consideration of cycle 29 treatment on the Alliance [id number] Prostate study receiving daily Enzalutamide, Abiraterone and Prednisone. VS remain stable. CBC and chemistries all within acceptable parameters for treatment. Patient still c/o mild fatigue and hot flashes which have not changed from previous visit. He returned his medication calendars which verify that he is taking the enzalutamide, abiraterone and prednisone as prescribed. He also returned the completed PK questionnaire and his old bottle of enzalutamide with 4 capsules remaining due to today's visit being scheduled one day later than usual. New medications exchanged in the pharmacy and a new bottle of 120 capsules of enzalutamide 40mg  was dispensed to patient. New medication calendars for cycle 29 were also given to William Wright. He reports that he is receiving his Abiraterone 250mg  capsules, #120 monthly from [facility], and he receives his prednisone prescription through a different mail order pharmacy in three month supplies. He will receive an Xgeva injection today and we will schedule his return study appointment on 02/14/17 for labs, MD, and Xgeva. Patient had his lithotripsy on 12/21/16 which he tolerated well.  Only one episode of renal colic in right flank since that time and pain free today.  Patient's PSA result from 12/20/16 result has still not been documented in EPIC.  [name] RN called for results on 12/24/2016.  PSA was 13.26.  Dr. [name] informed of this and I will contact lab and IT today to see if this issue will be resolved.   Adverse events and attributions were assessed as follows:   Adverse Event Log  Study/Protocol: Alliance [id number] Cycle: 28 adverse events  Event Grade Onset Date Resolved Date Drug Name Enzalutamide + Abiraterone Attribution Treatment Comments  Pain/flank Grade 1 12/21/16   Unrelated Lithotripsy 12/21/16 Occasional with passing of stone fragments  Fatigue  Grade 1 12/20/16 11/23/16  Probably  Patient reports it is same  Hot Flashes Grade 1 09/27/16   Possible  Improved some with Effexor  Renal Calculi Grade 1 12/21/16   Unrelated Lithotripsy 12/21/16 Occasionally passing stone fragments  Hematuria   12/21/16  Unrelated  S/p lithotripsy  Dyspepsia   12/21/16      Raynelle Dick, RN BSN "01/18/2017 2:01 PM"

## 2017-01-18 NOTE — Progress Notes (Signed)
[facility] OFFICE PROGRESS NOTE  Patient Care Team: System, Provider Not In as PCP - General Royston Cowper, MD (Urology)  Cancer of prostate [facility])   Staging form: Prostate, AJCC 7th Edition     Clinical: Stage IV (T2, M1) - Signed by Evlyn Kanner, NP on 01/07/2015    Oncology History   # FEB 2014- Prostate cancer Stage II [T2N0]; PA-18;   # May 2015-STAGE IV;  PSA 90 on ADT; CT/Bone scan-extensive mets;  casodex+ Trelstar  # June 2016- Castration resistant prostate cancer/Alliance protocol- ZYTIGA plus minus XTANDI; June 28th-CT-C/A/P- Stable Retrocrural/RP/Pelvic LN;Bone scan- Stable sclerotic lesions   # Xgeva     Cancer of prostate ([place])   08/17/2012 Initial Diagnosis    Cancer of prostate, stage II      12/03/2013 Progression         10/13/2014 Progression          Prostate cancer metastatic to multiple sites ([place])   01/19/2016 Initial Diagnosis    Prostate cancer metastatic to multiple sites [facility])        INTERVAL HISTORY:  William Wright 66 y.o.  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  In the interim patient had lithotripsy done in June 2018. No significant flank discomfort Or pain  post procedure.   Patient notes to have improvement of his hot flashes. Continues to be on Effexor. . Denies any unusual shortness of breath or cough. No chest pain or shortness of breath. No seizures. No weight loss. No bone pain.  Denies any jaw pain.  No swelling in the legs. No nausea or vomiting.  REVIEW OF SYSTEMS:  A complete 10 point review of system is done which is negative except mentioned above/history of present illness.   PAST MEDICAL HISTORY :  Past Medical History:  Diagnosis Date  . Cancer of prostate ([place]) 11/18/2014  . Prostate cancer ([place])     PAST SURGICAL HISTORY :   Past Surgical History:  Procedure Laterality Date   . EXTRACORPOREAL SHOCK WAVE LITHOTRIPSY Left 05/20/2015   Procedure: EXTRACORPOREAL SHOCK WAVE LITHOTRIPSY (ESWL);  Surgeon: Royston Cowper, MD;  Location: [facility] ORS;  Service: Urology;  Laterality: Left;  . EXTRACORPOREAL SHOCK WAVE LITHOTRIPSY Right 12/21/2016   Procedure: EXTRACORPOREAL SHOCK WAVE LITHOTRIPSY (ESWL);  Surgeon: Royston Cowper, MD;  Location: [facility] ORS;  Service: Urology;  Laterality: Right;    FAMILY HISTORY :  No family history on file.  SOCIAL HISTORY:   Social History  Substance Use Topics  . Smoking status: Never Smoker  . Smokeless tobacco: Never Used  . Alcohol use No    ALLERGIES:  is allergic to no known allergies.  MEDICATIONS:  Current Outpatient Prescriptions  Medication Sig Dispense Refill  . abiraterone Acetate (ZYTIGA) 250 MG tablet Take 4 tablets (1,000 mg total) by mouth daily. Take on an empty stomach 1 hour before or 2 hours after a meal 120 tablet 4  . Investigational enzalutamide 40 MG capsule ALLIANCE [id number] Take 4 capsules (160 mg total) by mouth daily. Take with or without food.Swallow whole. Do not crush, or open capsules. 120 capsule 0  . predniSONE (DELTASONE) 5 MG tablet Take 1 tablet (5 mg total) by mouth 2 (two) times daily. 60 tablet 0  . tamsulosin (FLOMAX) 0.4 MG CAPS capsule Take 1 capsule (0.4 mg total) by mouth daily. 30 capsule 11  . Triptorelin  Pamoate (TRELSTAR) 22.5 MG injection Inject 22.5 mg into the muscle every 6 (six) months.    . venlafaxine XR (EFFEXOR-XR) 37.5 MG 24 hr capsule Take 1 capsule (37.5 mg total) by mouth daily with breakfast. 30 capsule 4  . Investigational enzalutamide 40 MG capsule ALLIANCE [id number] Take 4 capsules (160 mg total) by mouth daily. Take with or without food.Swallow whole. Do not crush, or open capsules. 120 capsule 0  . NUCYNTA 50 MG tablet Take 1 tablet (50 mg total) by mouth every 6 (six) hours as needed for moderate pain. 1 TO 2 TABS Q 6 HOURS PRN PAIN (Patient not taking: Reported on  01/18/2017) 30 tablet 0  . valACYclovir (VALTREX) 1000 MG tablet Take 1 tablet (1,000 mg total) by mouth 2 (two) times daily. (Patient not taking: Reported on 01/18/2017) 60 tablet 6   No current facility-administered medications for this visit.     PHYSICAL EXAMINATION: ECOG PERFORMANCE STATUS: 0 - Asymptomatic  BP 123/72 (BP Location: Left Arm, Patient Position: Sitting)   Pulse 63   Temp 98.6 F (37 C) (Tympanic)   Resp 20   Ht 6' (1.829 m)   Wt 167 lb (75.8 kg)   BMI 22.65 kg/m   Filed Weights   01/18/17 1043  Weight: 167 lb (75.8 kg)    GENERAL: Well-nourished well-developed; Alert, no distress and comfortable. He is Alone. EYES: no pallor or icterus OROPHARYNX: no thrush or ulceration; good dentition  NECK: supple, no masses felt LYMPH:  no palpable lymphadenopathy in the cervical, axillary or inguinal regions LUNGS: clear to auscultation and  No wheeze or crackles HEART/CVS: regular rate & rhythm and no murmurs; No lower extremity edema ABDOMEN:abdomen soft, non-tender and normal bowel sounds Musculoskeletal:no cyanosis of digits and no clubbing  PSYCH: alert & oriented x 3 with fluent speech NEURO: no focal motor/sensory deficits SKIN:  no rashes or significant lesions  LABORATORY DATA:  I have reviewed the data as listed    Component Value Date/Time   NA 138 01/18/2017 1023   NA 137 11/12/2014 1415   K 4.2 01/18/2017 1023   K 4.1 11/12/2014 1415   CL 104 01/18/2017 1023   CL 105 11/12/2014 1415   CO2 27 01/18/2017 1023   CO2 24 11/12/2014 1415   GLUCOSE 93 01/18/2017 1023   GLUCOSE 98 11/12/2014 1415   BUN 30 (H) 01/18/2017 1023   BUN 24 (H) 11/12/2014 1415   CREATININE 0.98 01/18/2017 1023   CREATININE 1.02 11/12/2014 1415   CALCIUM 9.1 01/18/2017 1023   CALCIUM 9.2 11/12/2014 1415   PROT 6.9 01/18/2017 1023   PROT 7.4 11/12/2014 1415   ALBUMIN 4.2 01/18/2017 1023   ALBUMIN 4.4 11/12/2014 1415   AST 15 01/18/2017 1023   AST 23 11/12/2014 1415   ALT  11 (L) 01/18/2017 1023   ALT 16 (L) 11/12/2014 1415   ALKPHOS 51 01/18/2017 1023   ALKPHOS 103 11/12/2014 1415   BILITOT 0.9 01/18/2017 1023   BILITOT 0.4 11/12/2014 1415   GFRNONAA >60 01/18/2017 1023   GFRNONAA >60 11/12/2014 1415   GFRAA >60 01/18/2017 1023   GFRAA >60 11/12/2014 1415    No results found for: SPEP, UPEP  Lab Results  Component Value Date   WBC 3.7 (L) 01/18/2017   NEUTROABS 1.6 01/18/2017   HGB 14.1 01/18/2017   HCT 39.8 (L) 01/18/2017   MCV 96.6 01/18/2017   PLT 182 01/18/2017      Chemistry      Component Value  Date/Time   NA 138 01/18/2017 1023   NA 137 11/12/2014 1415   K 4.2 01/18/2017 1023   K 4.1 11/12/2014 1415   CL 104 01/18/2017 1023   CL 105 11/12/2014 1415   CO2 27 01/18/2017 1023   CO2 24 11/12/2014 1415   BUN 30 (H) 01/18/2017 1023   BUN 24 (H) 11/12/2014 1415   CREATININE 0.98 01/18/2017 1023   CREATININE 1.02 11/12/2014 1415      Component Value Date/Time   CALCIUM 9.1 01/18/2017 1023   CALCIUM 9.2 11/12/2014 1415   ALKPHOS 51 01/18/2017 1023   ALKPHOS 103 11/12/2014 1415   AST 15 01/18/2017 1023   AST 23 11/12/2014 1415   ALT 11 (L) 01/18/2017 1023   ALT 16 (L) 11/12/2014 1415   BILITOT 0.9 01/18/2017 1023   BILITOT 0.4 11/12/2014 1415      Results for Miyasato, PHILLIP L (MRN [id number]) as of 02/13/2017 14:16  Ref. Range 09/27/2016 09:45 10/25/2016 09:04 11/23/2016 09:28 12/20/2016 13:29 01/18/2017 10:23  PSA Latest Ref Range: 0.00 - 4.00 ng/mL 12.23 (H) 9.49 (H) 9.29 (H)    Prostatic Specific Antigen Latest Ref Range: 0.00 - 4.00 ng/mL    13.26 (H) 10.46 (H)     RADIOGRAPHIC STUDIES: I have personally reviewed the radiological images as listed and agreed with the findings in the report. No results found.   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 Also on Zytiga plus Xtandi- as per  clinical trial. MAY 31st PSA 12.May 2018- CT scan C/A/P- stable disease-lymphadenopathy in the pelvis and retroperitoneum; bone scan - new 6th rib. PSA- pending. Trelstar due in July 2018.   # Patient tolerating treatment very well- except for hot flashes/Continue same treatments.  No obvious signs of clinical progression.  # Hot flashes- G-1-  On Effexor improved.  # Bone lesions continue Xgeva; no side effects noted. Continue calcium and vitamin D.  # follow up in 4 weeks/x-geva/labs.    No orders of the defined types were placed in this encounter.  All questions were answered. The patient knows to call the clinic with any problems, questions or concerns.      Cammie Sickle, MD 02/13/2017 2:16 PM

## 2017-01-29 NOTE — Progress Notes (Signed)
William Wright returns to clinic today for consideration of cycle 29 treatment on the Alliance [id number] Prostate study receiving daily Enzalutamide, Abiraterone and Prednisone. VS remain stable. CBC and chemistries all within acceptable parameters for treatment. Patient still c/o mild fatigue and hot flashes which have not changed from previous visit. He returned his medication calendars which verify that he is taking the enzalutamide, abiraterone and prednisone as prescribed. He also returned the completed PK questionnaire and his old bottle of enzalutamide with 4 capsules remaining due to today's visit being scheduled one day later than usual. New medications exchanged in the pharmacy and a new bottle of 120 capsules of enzalutamide 40mg  was dispensed to patient. New medication calendars for cycle 29 were also given to William Wright. He reports that he is receiving his Abiraterone 250mg  capsules, #120 monthly from [facility], and he receives his prednisone prescription through a different mail order pharmacy in three month supplies. He will receive an Xgeva injection today and we will schedule his return study appointment on 02/14/17 for labs, MD, and Xgeva. Patient had his lithotripsy on 12/21/16 which he tolerated well.  Only one episode of renal colic in right flank since that time and pain free today.  Patient's PSA result from 12/20/16 result has still not been documented in EPIC.  William Jolly RN called for results on 12/24/2016.  PSA was 13.26.  Dr. [name] informed of this and I will contact lab and IT today to see if this issue will be resolved.   Adverse events and attributions were assessed as follows:   Adverse Event Log  Study/Protocol: Alliance [id number] Cycle: 28 adverse events  Event Grade Onset Date Resolved Date Drug Name Enzalutamide + Abiraterone Attribution Treatment Comments  Pain/flank Grade 1 12/21/16   Unrelated Lithotripsy 12/21/16 Occasional with passing of stone fragments  Fatigue  Grade 1 12/20/16 11/23/16  Probably  Patient reports it is same  Hot Flashes Grade 1 09/27/16   Possible  Improved some with Effexor  Renal Calculi Grade 1 12/21/16   Unrelated Lithotripsy 12/21/16 Occasionally passing stone fragments  Hematuria   12/21/16  Unrelated  S/p lithotripsy  Dyspepsia   12/21/16      Raynelle Dick, RN BSN "01/29/2017 1:58 PM"  Original note inadvertently submitted under the 12/22/16 Research Encounter. Raynelle Dick, RN BSN "01/29/2017 2:00 PM"

## 2017-01-30 DIAGNOSIS — D400 Neoplasm of uncertain behavior of prostate: Secondary | ICD-10-CM | POA: Diagnosis not present

## 2017-01-30 DIAGNOSIS — N201 Calculus of ureter: Secondary | ICD-10-CM | POA: Diagnosis not present

## 2017-01-30 DIAGNOSIS — C61 Malignant neoplasm of prostate: Secondary | ICD-10-CM | POA: Diagnosis not present

## 2017-02-13 ENCOUNTER — Other Ambulatory Visit: Payer: Self-pay | Admitting: *Deleted

## 2017-02-13 ENCOUNTER — Other Ambulatory Visit: Payer: Self-pay | Admitting: Internal Medicine

## 2017-02-13 DIAGNOSIS — C61 Malignant neoplasm of prostate: Secondary | ICD-10-CM

## 2017-02-14 ENCOUNTER — Other Ambulatory Visit: Payer: Self-pay

## 2017-02-14 ENCOUNTER — Inpatient Hospital Stay (HOSPITAL_BASED_OUTPATIENT_CLINIC_OR_DEPARTMENT_OTHER): Payer: Medicare Other | Admitting: Internal Medicine

## 2017-02-14 ENCOUNTER — Encounter: Payer: Self-pay | Admitting: *Deleted

## 2017-02-14 ENCOUNTER — Inpatient Hospital Stay: Payer: Medicare Other

## 2017-02-14 ENCOUNTER — Inpatient Hospital Stay: Payer: Medicare Other | Attending: Internal Medicine

## 2017-02-14 VITALS — BP 138/79 | HR 69 | Temp 96.2°F | Resp 20 | Ht 72.0 in | Wt 162.0 lb

## 2017-02-14 DIAGNOSIS — Z7952 Long term (current) use of systemic steroids: Secondary | ICD-10-CM | POA: Insufficient documentation

## 2017-02-14 DIAGNOSIS — R6889 Other general symptoms and signs: Secondary | ICD-10-CM | POA: Diagnosis not present

## 2017-02-14 DIAGNOSIS — Z79899 Other long term (current) drug therapy: Secondary | ICD-10-CM | POA: Insufficient documentation

## 2017-02-14 DIAGNOSIS — Z79818 Long term (current) use of other agents affecting estrogen receptors and estrogen levels: Secondary | ICD-10-CM | POA: Insufficient documentation

## 2017-02-14 DIAGNOSIS — C7951 Secondary malignant neoplasm of bone: Secondary | ICD-10-CM | POA: Diagnosis not present

## 2017-02-14 DIAGNOSIS — I712 Thoracic aortic aneurysm, without rupture: Secondary | ICD-10-CM | POA: Insufficient documentation

## 2017-02-14 DIAGNOSIS — Z006 Encounter for examination for normal comparison and control in clinical research program: Secondary | ICD-10-CM | POA: Insufficient documentation

## 2017-02-14 DIAGNOSIS — C61 Malignant neoplasm of prostate: Secondary | ICD-10-CM

## 2017-02-14 DIAGNOSIS — R232 Flushing: Secondary | ICD-10-CM | POA: Insufficient documentation

## 2017-02-14 DIAGNOSIS — R590 Localized enlarged lymph nodes: Secondary | ICD-10-CM | POA: Diagnosis not present

## 2017-02-14 DIAGNOSIS — R222 Localized swelling, mass and lump, trunk: Secondary | ICD-10-CM | POA: Insufficient documentation

## 2017-02-14 LAB — CBC WITH DIFFERENTIAL/PLATELET
Basophils Absolute: 0 10*3/uL (ref 0–0.1)
Basophils Relative: 1 %
EOS ABS: 0 10*3/uL (ref 0–0.7)
EOS PCT: 1 %
HCT: 42.4 % (ref 40.0–52.0)
HEMOGLOBIN: 15.1 g/dL (ref 13.0–18.0)
LYMPHS ABS: 1.4 10*3/uL (ref 1.0–3.6)
Lymphocytes Relative: 36 %
MCH: 34.4 pg — ABNORMAL HIGH (ref 26.0–34.0)
MCHC: 35.5 g/dL (ref 32.0–36.0)
MCV: 96.9 fL (ref 80.0–100.0)
MONOS PCT: 11 %
Monocytes Absolute: 0.4 10*3/uL (ref 0.2–1.0)
NEUTROS PCT: 51 %
Neutro Abs: 2 10*3/uL (ref 1.4–6.5)
Platelets: 221 10*3/uL (ref 150–440)
RBC: 4.38 MIL/uL — ABNORMAL LOW (ref 4.40–5.90)
RDW: 13.7 % (ref 11.5–14.5)
WBC: 4 10*3/uL (ref 3.8–10.6)

## 2017-02-14 LAB — COMPREHENSIVE METABOLIC PANEL
ALBUMIN: 4.5 g/dL (ref 3.5–5.0)
ALT: 12 U/L — ABNORMAL LOW (ref 17–63)
ANION GAP: 8 (ref 5–15)
AST: 17 U/L (ref 15–41)
Alkaline Phosphatase: 55 U/L (ref 38–126)
BUN: 20 mg/dL (ref 6–20)
CO2: 28 mmol/L (ref 22–32)
Calcium: 9.6 mg/dL (ref 8.9–10.3)
Chloride: 103 mmol/L (ref 101–111)
Creatinine, Ser: 0.97 mg/dL (ref 0.61–1.24)
GFR calc Af Amer: >60 mL/min (ref >60)
GFR calc non Af Amer: >60 mL/min (ref >60)
GLUCOSE: 88 mg/dL (ref 65–99)
POTASSIUM: 4.3 mmol/L (ref 3.5–5.1)
SODIUM: 139 mmol/L (ref 135–145)
Total Bilirubin: 0.8 mg/dL (ref 0.3–1.2)
Total Protein: 7.6 g/dL (ref 6.5–8.1)

## 2017-02-14 LAB — PSA: Prostatic Specific Antigen: 12.49 ng/mL — ABNORMAL HIGH (ref 0.00–4.00)

## 2017-02-14 MED ORDER — TRIPTORELIN PAMOATE 22.5 MG IM SUSR: 22.5000 mg | Freq: Once | INTRAMUSCULAR | Status: DC

## 2017-02-14 MED ORDER — VENLAFAXINE HCL ER 37.5 MG PO CP24: 37.5000 mg | ORAL_CAPSULE | Freq: Every day | ORAL | 11 refills | Status: DC

## 2017-02-14 MED ORDER — DENOSUMAB 120 MG/1.7ML ~~LOC~~ SOLN
120.0000 mg | Freq: Once | SUBCUTANEOUS | Status: AC
Start: 2017-02-14 — End: 2017-02-14
  Administered 2017-02-14: 120 mg via SUBCUTANEOUS
  Filled 2017-02-14: qty 1.7

## 2017-02-14 MED ORDER — INV-ENZALUTAMIDE 40 MG CAPS #120 ALLIANCE A031201: 160.0000 mg | ORAL_CAPSULE | Freq: Every day | ORAL | 0 refills | Status: DC

## 2017-02-14 NOTE — Progress Notes (Signed)
William Wright returns to clinic today for consideration of cycle 30 treatment on the Alliance [id number] Prostate study receiving daily Enzalutamide, Abiraterone and Prednisone. VS remain stable. CBC and chemistries all within acceptable parameters for treatment. Patient still c/o mild fatigue and hot flashes which have improved from previous visit.  He returned his medication calendars which verify that he is taking the enzalutamide, abiraterone and prednisone as prescribed. He also returned the completed PK questionnaire and his old bottle of enzalutamide with 4 capsules remaining and patient questioned about this as there should have been 12 today.  Patient stated he must have left the other 8 in his pill box.  New medications exchanged in the pharmacy and a new bottle of 120 capsules of enzalutamide 40mg  was dispensed to patient. New medication calendars for cycle 30 were also given to [name]. He reports that he is receiving his Abiraterone 250mg  capsules, #120 monthly from [facility], and he receives his prednisone prescription through a different mail order pharmacy in three month supplies. He will receive an Xgeva injection today and should have received Trelstar, however, the pharmacy does not have the correct dose available.  Patient to return on 02/16/17 for Trelstar.  He has been scheduled for CT and Bone Scan on 03/09/17 and will return to clinic on 03/14/17 for scan results and consideration of cycle 31 if no disease progression.  PSA will result on 02/15/17. Patient will call for results.    Adverse events and attributions were assessed as follows:   Adverse Event Log  Study/Protocol: Alliance [id number] Cycle: 29 adverse events  Event Grade Onset Date Resolved Date Drug Name Enzalutamide + Abiraterone Attribution Treatment Comments  Fatigue Grade 1 12/20/16 11/23/16  Probably  Patient reports it is same  Hot Flashes Grade 1 09/27/16   Possible  Improved some with Effexor  Raynelle Dick, RN  BSN "02/14/2017 11:46 AM"  "

## 2017-02-14 NOTE — Progress Notes (Signed)
[place] OFFICE PROGRESS NOTE  Patient Care Team: System, Provider Not In as PCP - General Royston Cowper, MD (Urology)  Cancer of prostate [facility])   Staging form: Prostate, AJCC 7th Edition     Clinical: Stage IV (T2, M1) - Signed by Evlyn Kanner, NP on 01/07/2015    Oncology History   # FEB 2014- Prostate cancer Stage II [T2N0]; PA-18;   # May 2015-STAGE IV;  PSA 90 on ADT; CT/Bone scan-extensive mets;  casodex+ Trelstar  # June 2016- Castration resistant prostate cancer/[facility] protocol- ZYTIGA plus minus XTANDI; June 28th-CT-C/A/P- Stable Retrocrural/RP/Pelvic LN;Bone scan- Stable sclerotic lesions   # Xgeva     Cancer of prostate ([place])   08/17/2012 Initial Diagnosis    Cancer of prostate, stage II      12/03/2013 Progression         10/13/2014 Progression          Prostate cancer metastatic to multiple sites ([place])   01/19/2016 Initial Diagnosis    Prostate cancer metastatic to multiple sites [facility])        INTERVAL HISTORY:  William Wright 66 y.o.  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  Patient notes to have improvement of his hot flashes. Continues to be on Effexor. . Denies any unusual shortness of breath or cough. No chest pain or shortness of breath. No seizures. No weight loss. No bone pain.  Denies any jaw pain.  No swelling in the legs. No nausea or vomiting.  REVIEW OF SYSTEMS:  A complete 10 point review of system is done which is negative except mentioned above/history of present illness.   PAST MEDICAL HISTORY :  Past Medical History:  Diagnosis Date  . Cancer of prostate ([place]) 11/18/2014  . Prostate cancer ([place])     PAST SURGICAL HISTORY :   Past Surgical History:  Procedure Laterality Date  . EXTRACORPOREAL SHOCK WAVE LITHOTRIPSY Left 05/20/2015   Procedure: EXTRACORPOREAL SHOCK WAVE LITHOTRIPSY (ESWL);   Surgeon: Royston Cowper, MD;  Location: [facility] ORS;  Service: Urology;  Laterality: Left;  . EXTRACORPOREAL SHOCK WAVE LITHOTRIPSY Right 12/21/2016   Procedure: EXTRACORPOREAL SHOCK WAVE LITHOTRIPSY (ESWL);  Surgeon: Royston Cowper, MD;  Location: [facility] ORS;  Service: Urology;  Laterality: Right;    FAMILY HISTORY :  No family history on file.  SOCIAL HISTORY:   Social History  Substance Use Topics  . Smoking status: Never Smoker  . Smokeless tobacco: Never Used  . Alcohol use No    ALLERGIES:  is allergic to no known allergies.  MEDICATIONS:  Current Outpatient Prescriptions  Medication Sig Dispense Refill  . abiraterone Acetate (ZYTIGA) 250 MG tablet Take 4 tablets (1,000 mg total) by mouth daily. Take on an empty stomach 1 hour before or 2 hours after a meal 120 tablet 4  . Investigational enzalutamide 40 MG capsule [facility] [id number] Take 4 capsules (160 mg total) by mouth daily. Take with or without food.Swallow whole. Do not crush, or open capsules. 120 capsule 0  . Investigational enzalutamide 40 MG capsule [facility] [id number] Take 4 capsules (160 mg total) by mouth daily. Take with or without food.Swallow whole. Do not crush, or open capsules. 120 capsule 0  . predniSONE (DELTASONE) 5 MG tablet Take 1 tablet (5 mg total) by mouth 2 (two) times daily. 60 tablet 0  . tamsulosin (FLOMAX) 0.4 MG CAPS capsule Take 1 capsule (  0.4 mg total) by mouth daily. 30 capsule 11  . Triptorelin Pamoate (TRELSTAR) 22.5 MG injection Inject 22.5 mg into the muscle every 6 (six) months.    . venlafaxine XR (EFFEXOR-XR) 37.5 MG 24 hr capsule Take 1 capsule (37.5 mg total) by mouth daily with breakfast. 30 capsule 11  . Investigational enzalutamide 40 MG capsule [facility] [id number] Take 4 capsules (160 mg total) by mouth daily. Take with or without food.Swallow whole. Do not crush, or open capsules. 120 capsule 0  . NUCYNTA 50 MG tablet Take 1 tablet (50 mg total) by mouth every 6 (six) hours as needed for  moderate pain. 1 TO 2 TABS Q 6 HOURS PRN PAIN (Patient not taking: Reported on 02/14/2017) 30 tablet 0  . valACYclovir (VALTREX) 1000 MG tablet Take 1 tablet (1,000 mg total) by mouth 2 (two) times daily. (Patient not taking: Reported on 02/14/2017) 60 tablet 6   No current facility-administered medications for this visit.     PHYSICAL EXAMINATION: ECOG PERFORMANCE STATUS: 0 - Asymptomatic  BP 138/79 (BP Location: Left Arm, Patient Position: Sitting)   Pulse 69   Temp (!) 96.2 F (35.7 C) (Tympanic)   Resp 20   Ht 6' (1.829 m)   Wt 162 lb (73.5 kg)   BMI 21.97 kg/m   Filed Weights   02/14/17 1011  Weight: 162 lb (73.5 kg)    GENERAL: Well-nourished well-developed; Alert, no distress and comfortable. He is Alone. EYES: no pallor or icterus OROPHARYNX: no thrush or ulceration; good dentition  NECK: supple, no masses felt LYMPH:  no palpable lymphadenopathy in the cervical, axillary or inguinal regions LUNGS: clear to auscultation and  No wheeze or crackles HEART/CVS: regular rate & rhythm and no murmurs; No lower extremity edema ABDOMEN:abdomen soft, non-tender and normal bowel sounds Musculoskeletal:no cyanosis of digits and no clubbing  PSYCH: alert & oriented x 3 with fluent speech NEURO: no focal motor/sensory deficits SKIN:  no rashes or significant lesions  LABORATORY DATA:  I have reviewed the data as listed    Component Value Date/Time   NA 139 02/14/2017 0927   NA 137 11/12/2014 1415   K 4.3 02/14/2017 0927   K 4.1 11/12/2014 1415   CL 103 02/14/2017 0927   CL 105 11/12/2014 1415   CO2 28 02/14/2017 0927   CO2 24 11/12/2014 1415   GLUCOSE 88 02/14/2017 0927   GLUCOSE 98 11/12/2014 1415   BUN 20 02/14/2017 0927   BUN 24 (H) 11/12/2014 1415   CREATININE 0.97 02/14/2017 0927   CREATININE 1.02 11/12/2014 1415   CALCIUM 9.6 02/14/2017 0927   CALCIUM 9.2 11/12/2014 1415   PROT 7.6 02/14/2017 0927   PROT 7.4 11/12/2014 1415   ALBUMIN 4.5 02/14/2017 0927    ALBUMIN 4.4 11/12/2014 1415   AST 17 02/14/2017 0927   AST 23 11/12/2014 1415   ALT 12 (L) 02/14/2017 0927   ALT 16 (L) 11/12/2014 1415   ALKPHOS 55 02/14/2017 0927   ALKPHOS 103 11/12/2014 1415   BILITOT 0.8 02/14/2017 0927   BILITOT 0.4 11/12/2014 1415   GFRNONAA >60 02/14/2017 0927   GFRNONAA >60 11/12/2014 1415   GFRAA >60 02/14/2017 0927   GFRAA >60 11/12/2014 1415    No results found for: SPEP, UPEP  Lab Results  Component Value Date   WBC 4.0 02/14/2017   NEUTROABS 2.0 02/14/2017   HGB 15.1 02/14/2017   HCT 42.4 02/14/2017   MCV 96.9 02/14/2017   PLT 221 02/14/2017  Chemistry      Component Value Date/Time   NA 139 02/14/2017 0927   NA 137 11/12/2014 1415   K 4.3 02/14/2017 0927   K 4.1 11/12/2014 1415   CL 103 02/14/2017 0927   CL 105 11/12/2014 1415   CO2 28 02/14/2017 0927   CO2 24 11/12/2014 1415   BUN 20 02/14/2017 0927   BUN 24 (H) 11/12/2014 1415   CREATININE 0.97 02/14/2017 0927   CREATININE 1.02 11/12/2014 1415      Component Value Date/Time   CALCIUM 9.6 02/14/2017 0927   CALCIUM 9.2 11/12/2014 1415   ALKPHOS 55 02/14/2017 0927   ALKPHOS 103 11/12/2014 1415   AST 17 02/14/2017 0927   AST 23 11/12/2014 1415   ALT 12 (L) 02/14/2017 0927   ALT 16 (L) 11/12/2014 1415   BILITOT 0.8 02/14/2017 0927   BILITOT 0.4 11/12/2014 1415      Results for Wright, William L (MRN [id number]) as of 02/14/2017 10:32  Ref. Range 09/27/2016 09:45 10/25/2016 09:04 11/23/2016 09:28 12/20/2016 13:29 01/18/2017 10:23  PSA Latest Ref Range: 0.00 - 4.00 ng/mL 12.23 (H) 9.49 (H) 9.29 (H)    Prostatic Specific Antigen Latest Ref Range: 0.00 - 4.00 ng/mL    13.26 (H) 10.46 (H)     RADIOGRAPHIC STUDIES: I have personally reviewed the radiological images as listed and agreed with the findings in the report. No results found.   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 Also on Zytiga plus Xtandi- as per clinical trial. MAY 31st PSA 12.May 2018- CT scan C/A/P- stable disease-lymphadenopathy in the pelvis and retroperitoneum; bone scan - new 6th rib. PSA July 2018- 10/ STABLE.   # Patient tolerating treatment very well- except for hot flashes/Continue same treatments.  No obvious signs of clinical progression.  # Hot flashes- G-1-  On Effexor improved.  # Bone lesions continue Xgeva; no side effects noted. Continue calcium and vitamin D.  # follow up in 4 weeks/x-geva/labs; CT prior.    No orders of the defined types were placed in this encounter.  All questions were answered. The patient knows to call the clinic with any problems, questions or concerns.      Cammie Sickle, MD 02/14/2017 6:46 PM

## 2017-02-14 NOTE — Progress Notes (Signed)
Patient requested RF on effexor. He states that he still has hot flashes but the effexor helps to control the hot flashes.  Pt is currently on Zytiga and prednisone. He is tolerating the medication well. Has not missed any dosing.

## 2017-02-14 NOTE — Assessment & Plan Note (Addendum)
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 Also on Zytiga plus Xtandi- as per clinical trial. MAY 31st PSA 12.May 2018- CT scan C/A/P- stable disease-lymphadenopathy in the pelvis and retroperitoneum; bone scan - new 6th rib. PSA July 2018- 10/ STABLE.   # Patient tolerating treatment very well- except for hot flashes/Continue same treatments.  No obvious signs of clinical progression.  # Hot flashes- G-1-  On Effexor improved.  # Bone lesions continue Xgeva; no side effects noted. Continue calcium and vitamin D.  # follow up in 4 weeks/x-geva/labs; CT prior.

## 2017-02-16 ENCOUNTER — Inpatient Hospital Stay: Payer: Medicare Other

## 2017-02-16 DIAGNOSIS — C61 Malignant neoplasm of prostate: Secondary | ICD-10-CM | POA: Diagnosis not present

## 2017-02-16 DIAGNOSIS — Z79818 Long term (current) use of other agents affecting estrogen receptors and estrogen levels: Secondary | ICD-10-CM | POA: Diagnosis not present

## 2017-02-16 DIAGNOSIS — R590 Localized enlarged lymph nodes: Secondary | ICD-10-CM | POA: Diagnosis not present

## 2017-02-16 DIAGNOSIS — C7951 Secondary malignant neoplasm of bone: Secondary | ICD-10-CM | POA: Diagnosis not present

## 2017-02-16 DIAGNOSIS — R222 Localized swelling, mass and lump, trunk: Secondary | ICD-10-CM | POA: Diagnosis not present

## 2017-02-16 DIAGNOSIS — Z006 Encounter for examination for normal comparison and control in clinical research program: Secondary | ICD-10-CM | POA: Diagnosis not present

## 2017-02-16 MED ORDER — TRIPTORELIN PAMOATE 22.5 MG IM SUSR
22.5000 mg | Freq: Once | INTRAMUSCULAR | Status: AC
  Administered 2017-02-16: 22.5 mg via INTRAMUSCULAR
  Filled 2017-02-16: qty 22.5

## 2017-03-09 ENCOUNTER — Ambulatory Visit
Admission: RE | Admit: 2017-03-09 | Discharge: 2017-03-09 | Disposition: A | Discharge status: Home / Self Care | Payer: Medicare Other | Source: Ambulatory Visit | Attending: Internal Medicine | Admitting: Internal Medicine

## 2017-03-09 DIAGNOSIS — C61 Malignant neoplasm of prostate: Secondary | ICD-10-CM

## 2017-03-09 DIAGNOSIS — I712 Thoracic aortic aneurysm, without rupture: Secondary | ICD-10-CM | POA: Insufficient documentation

## 2017-03-09 DIAGNOSIS — M899 Disorder of bone, unspecified: Secondary | ICD-10-CM | POA: Diagnosis not present

## 2017-03-09 DIAGNOSIS — C7951 Secondary malignant neoplasm of bone: Secondary | ICD-10-CM | POA: Diagnosis not present

## 2017-03-09 MED ORDER — IOPAMIDOL (ISOVUE-300) INJECTION 61%
100.0000 mL | Freq: Once | INTRAVENOUS | Status: AC | PRN
  Administered 2017-03-09: 100 mL via INTRAVENOUS

## 2017-03-09 MED ORDER — TECHNETIUM TC 99M MEDRONATE IV KIT
25.0000 | PACK | Freq: Once | INTRAVENOUS | Status: AC | PRN
  Administered 2017-03-09: 22.37 via INTRAVENOUS

## 2017-03-14 ENCOUNTER — Inpatient Hospital Stay (HOSPITAL_BASED_OUTPATIENT_CLINIC_OR_DEPARTMENT_OTHER): Payer: Medicare Other | Admitting: Internal Medicine

## 2017-03-14 ENCOUNTER — Encounter: Payer: Self-pay | Admitting: *Deleted

## 2017-03-14 ENCOUNTER — Inpatient Hospital Stay: Payer: Medicare Other

## 2017-03-14 VITALS — BP 123/78 | HR 69 | Temp 96.4°F | Resp 16 | Wt 164.8 lb

## 2017-03-14 DIAGNOSIS — R222 Localized swelling, mass and lump, trunk: Secondary | ICD-10-CM

## 2017-03-14 DIAGNOSIS — R232 Flushing: Secondary | ICD-10-CM | POA: Diagnosis not present

## 2017-03-14 DIAGNOSIS — C61 Malignant neoplasm of prostate: Secondary | ICD-10-CM

## 2017-03-14 DIAGNOSIS — Z79818 Long term (current) use of other agents affecting estrogen receptors and estrogen levels: Secondary | ICD-10-CM

## 2017-03-14 DIAGNOSIS — Z006 Encounter for examination for normal comparison and control in clinical research program: Secondary | ICD-10-CM | POA: Diagnosis not present

## 2017-03-14 DIAGNOSIS — Z79899 Other long term (current) drug therapy: Secondary | ICD-10-CM | POA: Diagnosis not present

## 2017-03-14 DIAGNOSIS — R6889 Other general symptoms and signs: Secondary | ICD-10-CM

## 2017-03-14 DIAGNOSIS — Z7952 Long term (current) use of systemic steroids: Secondary | ICD-10-CM | POA: Diagnosis not present

## 2017-03-14 DIAGNOSIS — R590 Localized enlarged lymph nodes: Secondary | ICD-10-CM | POA: Diagnosis not present

## 2017-03-14 DIAGNOSIS — C7951 Secondary malignant neoplasm of bone: Secondary | ICD-10-CM | POA: Diagnosis not present

## 2017-03-14 LAB — CBC WITH DIFFERENTIAL/PLATELET
BASOS ABS: 0 10*3/uL (ref 0–0.1)
BASOS PCT: 1 %
Eosinophils Absolute: 0 10*3/uL (ref 0–0.7)
Eosinophils Relative: 1 %
HEMATOCRIT: 42.1 % (ref 40.0–52.0)
HEMOGLOBIN: 14.7 g/dL (ref 13.0–18.0)
Lymphocytes Relative: 28 %
Lymphs Abs: 1.4 10*3/uL (ref 1.0–3.6)
MCH: 34 pg (ref 26.0–34.0)
MCHC: 34.8 g/dL (ref 32.0–36.0)
MCV: 97.8 fL (ref 80.0–100.0)
Monocytes Absolute: 0.5 10*3/uL (ref 0.2–1.0)
Monocytes Relative: 10 %
NEUTROS ABS: 3.1 10*3/uL (ref 1.4–6.5)
NEUTROS PCT: 60 %
Platelets: 209 10*3/uL (ref 150–440)
RBC: 4.31 MIL/uL — AB (ref 4.40–5.90)
RDW: 13.8 % (ref 11.5–14.5)
WBC: 5.1 10*3/uL (ref 3.8–10.6)

## 2017-03-14 LAB — COMPREHENSIVE METABOLIC PANEL
ALBUMIN: 4.5 g/dL (ref 3.5–5.0)
ALT: 13 U/L — ABNORMAL LOW (ref 17–63)
ANION GAP: 8 (ref 5–15)
AST: 18 U/L (ref 15–41)
Alkaline Phosphatase: 51 U/L (ref 38–126)
BUN: 17 mg/dL (ref 6–20)
CHLORIDE: 101 mmol/L (ref 101–111)
CO2: 29 mmol/L (ref 22–32)
Calcium: 9.4 mg/dL (ref 8.9–10.3)
Creatinine, Ser: 0.93 mg/dL (ref 0.61–1.24)
GFR calc non Af Amer: >60 mL/min (ref >60)
Glucose, Bld: 97 mg/dL (ref 65–99)
Potassium: 4 mmol/L (ref 3.5–5.1)
SODIUM: 138 mmol/L (ref 135–145)
Total Bilirubin: 0.9 mg/dL (ref 0.3–1.2)
Total Protein: 7.5 g/dL (ref 6.5–8.1)

## 2017-03-14 LAB — PSA: Prostatic Specific Antigen: 9.66 ng/mL — ABNORMAL HIGH (ref 0.00–4.00)

## 2017-03-14 MED ORDER — DENOSUMAB 120 MG/1.7ML ~~LOC~~ SOLN
120.0000 mg | Freq: Once | SUBCUTANEOUS | Status: AC
  Administered 2017-03-14: 120 mg via SUBCUTANEOUS
  Filled 2017-03-14: qty 1.7

## 2017-03-14 NOTE — Progress Notes (Signed)
[place] OFFICE PROGRESS NOTE  Patient Care Team: System, Provider Not In as PCP - General Royston Cowper, MD (Urology)  Cancer of prostate [facility])   Staging form: Prostate, AJCC 7th Edition     Clinical: Stage IV (T2, M1) - Signed by Evlyn Kanner, NP on 01/07/2015    Oncology History   # FEB 2014- Prostate cancer Stage II [T2N0]; PA-18;   # May 2015-STAGE IV;  PSA 90 on ADT; CT/Bone scan-extensive mets;  casodex+ Trelstar  # June 2016- Castration resistant prostate cancer/[facility] protocol- ZYTIGA plus minus XTANDI; June 28th-CT-C/A/P- Stable Retrocrural/RP/Pelvic LN;Bone scan- Stable sclerotic lesions   # Xgeva     Cancer of prostate ([place])   08/17/2012 Initial Diagnosis    Cancer of prostate, stage II      12/03/2013 Progression         10/13/2014 Progression          Prostate cancer metastatic to multiple sites ([place])   01/19/2016 Initial Diagnosis    Prostate cancer metastatic to multiple sites [facility])        INTERVAL HISTORY:  William Wright 66 y.o.  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  Chronic mild hot flashes not any worse. Denies any unusual shortness of breath or cough. No chest pain or shortness of breath. No seizures. No weight loss. No bone pain.  Denies any jaw pain.  No swelling in the legs. No nausea or vomiting. Denies any significant fatigue.  He brings attention of a "lump" left lateral chest wall. He noticed approximately 2 months ago. Not getting any worse. No pain.  REVIEW OF SYSTEMS:  A complete 10 point review of system is done which is negative except mentioned above/history of present illness.   PAST MEDICAL HISTORY :  Past Medical History:  Diagnosis Date  . Cancer of prostate ([place]) 11/18/2014  . Prostate cancer ([place])     PAST SURGICAL HISTORY :    Past Surgical History:  Procedure Laterality Date  . EXTRACORPOREAL SHOCK WAVE LITHOTRIPSY Left 05/20/2015   Procedure: EXTRACORPOREAL SHOCK WAVE LITHOTRIPSY (ESWL);  Surgeon: Royston Cowper, MD;  Location: [facility] ORS;  Service: Urology;  Laterality: Left;  . EXTRACORPOREAL SHOCK WAVE LITHOTRIPSY Right 12/21/2016   Procedure: EXTRACORPOREAL SHOCK WAVE LITHOTRIPSY (ESWL);  Surgeon: Royston Cowper, MD;  Location: [facility] ORS;  Service: Urology;  Laterality: Right;    FAMILY HISTORY :  No family history on file.  SOCIAL HISTORY:   Social History  Substance Use Topics  . Smoking status: Never Smoker  . Smokeless tobacco: Never Used  . Alcohol use No    ALLERGIES:  is allergic to no known allergies.  MEDICATIONS:  Current Outpatient Prescriptions  Medication Sig Dispense Refill  . abiraterone Acetate (ZYTIGA) 250 MG tablet Take 4 tablets (1,000 mg total) by mouth daily. Take on an empty stomach 1 hour before or 2 hours after a meal 120 tablet 4  . Investigational enzalutamide 40 MG capsule [facility] [id number] Take 4 capsules (160 mg total) by mouth daily. Take with or without food.Swallow whole. Do not crush, or open capsules. 120 capsule 0  . Investigational enzalutamide 40 MG capsule [facility] [id number] Take 4 capsules (160 mg total) by mouth daily. Take with or without food.Swallow whole. Do not crush, or open capsules. 120 capsule 0  . Investigational enzalutamide  40 MG capsule [facility] [id number] Take 4 capsules (160 mg total) by mouth daily. Take with or without food.Swallow whole. Do not crush, or open capsules. 120 capsule 0  . NUCYNTA 50 MG tablet Take 1 tablet (50 mg total) by mouth every 6 (six) hours as needed for moderate pain. 1 TO 2 TABS Q 6 HOURS PRN PAIN 30 tablet 0  . tamsulosin (FLOMAX) 0.4 MG CAPS capsule Take 1 capsule (0.4 mg total) by mouth daily. 30 capsule 11  . Triptorelin Pamoate (TRELSTAR) 22.5 MG injection Inject 22.5 mg into the muscle every 6 (six) months.    . venlafaxine  XR (EFFEXOR-XR) 37.5 MG 24 hr capsule Take 1 capsule (37.5 mg total) by mouth daily with breakfast. 30 capsule 11  . predniSONE (DELTASONE) 5 MG tablet Take 1 tablet (5 mg total) by mouth 2 (two) times daily. (Patient not taking: Reported on 03/14/2017) 60 tablet 0  . valACYclovir (VALTREX) 1000 MG tablet Take 1 tablet (1,000 mg total) by mouth 2 (two) times daily. (Patient not taking: Reported on 03/14/2017) 60 tablet 6   No current facility-administered medications for this visit.     PHYSICAL EXAMINATION: ECOG PERFORMANCE STATUS: 0 - Asymptomatic  BP 123/78 (BP Location: Left Arm, Patient Position: Sitting)   Pulse 69   Temp (!) 96.4 F (35.8 C) (Tympanic)   Resp 16   Wt 164 lb 12.8 oz (74.8 kg)   BMI 22.35 kg/m   Filed Weights   03/14/17 1019  Weight: 164 lb 12.8 oz (74.8 kg)    GENERAL: Well-nourished well-developed; Alert, no distress and comfortable. He is Alone. EYES: no pallor or icterus OROPHARYNX: no thrush or ulceration; good dentition  NECK: supple, no masses felt LYMPH:  no palpable lymphadenopathy in the cervical, axillary or inguinal regions LUNGS: clear to auscultation and  No wheeze or crackles HEART/CVS: regular rate & rhythm and no murmurs; No lower extremity edema ABDOMEN:abdomen soft, non-tender and normal bowel sounds Musculoskeletal:no cyanosis of digits and no clubbing; Approximately 2 cm lump in the skin [lipoma] PSYCH: alert & oriented x 3 with fluent speech NEURO: no focal motor/sensory deficits SKIN:  no rashes or significant lesions  LABORATORY DATA:  I have reviewed the data as listed    Component Value Date/Time   NA 138 03/14/2017 0954   NA 137 11/12/2014 1415   K 4.0 03/14/2017 0954   K 4.1 11/12/2014 1415   CL 101 03/14/2017 0954   CL 105 11/12/2014 1415   CO2 29 03/14/2017 0954   CO2 24 11/12/2014 1415   GLUCOSE 97 03/14/2017 0954   GLUCOSE 98 11/12/2014 1415   BUN 17 03/14/2017 0954   BUN 24 (H) 11/12/2014 1415   CREATININE 0.93  03/14/2017 0954   CREATININE 1.02 11/12/2014 1415   CALCIUM 9.4 03/14/2017 0954   CALCIUM 9.2 11/12/2014 1415   PROT 7.5 03/14/2017 0954   PROT 7.4 11/12/2014 1415   ALBUMIN 4.5 03/14/2017 0954   ALBUMIN 4.4 11/12/2014 1415   AST 18 03/14/2017 0954   AST 23 11/12/2014 1415   ALT 13 (L) 03/14/2017 0954   ALT 16 (L) 11/12/2014 1415   ALKPHOS 51 03/14/2017 0954   ALKPHOS 103 11/12/2014 1415   BILITOT 0.9 03/14/2017 0954   BILITOT 0.4 11/12/2014 1415   GFRNONAA >60 03/14/2017 0954   GFRNONAA >60 11/12/2014 1415   GFRAA >60 03/14/2017 0954   GFRAA >60 11/12/2014 1415    No results found for: SPEP, UPEP  Lab Results  Component Value  Date   WBC 5.1 03/14/2017   NEUTROABS 3.1 03/14/2017   HGB 14.7 03/14/2017   HCT 42.1 03/14/2017   MCV 97.8 03/14/2017   PLT 209 03/14/2017      Chemistry      Component Value Date/Time   NA 138 03/14/2017 0954   NA 137 11/12/2014 1415   K 4.0 03/14/2017 0954   K 4.1 11/12/2014 1415   CL 101 03/14/2017 0954   CL 105 11/12/2014 1415   CO2 29 03/14/2017 0954   CO2 24 11/12/2014 1415   BUN 17 03/14/2017 0954   BUN 24 (H) 11/12/2014 1415   CREATININE 0.93 03/14/2017 0954   CREATININE 1.02 11/12/2014 1415      Component Value Date/Time   CALCIUM 9.4 03/14/2017 0954   CALCIUM 9.2 11/12/2014 1415   ALKPHOS 51 03/14/2017 0954   ALKPHOS 103 11/12/2014 1415   AST 18 03/14/2017 0954   AST 23 11/12/2014 1415   ALT 13 (L) 03/14/2017 0954   ALT 16 (L) 11/12/2014 1415   BILITOT 0.9 03/14/2017 0954   BILITOT 0.4 11/12/2014 1415      Results for Osten, PHILLIP L (MRN [id number]) as of 02/14/2017 10:32  Ref. Range 09/27/2016 09:45 10/25/2016 09:04 11/23/2016 09:28 12/20/2016 13:29 01/18/2017 10:23  PSA Latest Ref Range: 0.00 - 4.00 ng/mL 12.23 (H) 9.49 (H) 9.29 (H)    Prostatic Specific Antigen Latest Ref Range: 0.00 - 4.00 ng/mL    13.26 (H) 10.46 (H)   IMPRESSION: More apparent sclerotic lesion within the medial left sixth rib when compared to  prior CT however lesion was demonstrated on prior bone scan.  Otherwise stable findings within the chest, abdomen and pelvis.  4.7 cm ascending thoracic aortic aneurysm. Recommend semi-annual imaging followup by CTA or MRA and referral to cardiothoracic surgery if not already obtained. This recommendation follows 2010 ACCF/AHA/AATS/ACR/ASA/SCA/SCAI/SIR/STS/SVM Guidelines for the Diagnosis and Management of Patients With Thoracic Aortic Disease. Circulation. 2010; 121: G626-R485   Electronically Signed   By: Lovey Newcomer M.D.   On: 03/09/2017 12:04 -----------------------------------------------------------    IMPRESSION: 1. No new skeletal lesions. 2. LEFT sixth rib lesion decreased in intensity.   Electronically Signed   By: Suzy Bouchard M.D.   On: 03/09/2017 12:55  Results for KRISH, BAILLY (MRN [id number]) as of 03/14/2017 10:30  Ref. Range 10/25/2016 09:04 11/23/2016 09:28 12/20/2016 13:29 01/18/2017 10:23 02/14/2017 09:27  PSA Latest Ref Range: 0.00 - 4.00 ng/mL 9.49 (H) 9.29 (H)     Prostatic Specific Antigen Latest Ref Range: 0.00 - 4.00 ng/mL   13.26 (H) 10.46 (H) 12.49 (H)    RADIOGRAPHIC STUDIES: I have personally reviewed the radiological images as listed and agreed with the findings in the report. No results found.   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 Also on Zytiga plus Xtandi- as per clinical trial. AUG 2018  CT scan C/A/P- stable disease-lymphadenopathy in the pelvis and retroperitoneum; bone scan-stable. PSA July 2018- 10/ STABLE.   # Patient tolerating treatment very well- except for hot flashes/Continue same treatments.  No obvious signs of clinical progression.  # "Lipoma"- left chest wall. Monitor for now.  # Hot flashes- G-1-  On Effexor improved.  # Bone lesions continue Xgeva; no side effects noted.  Continue calcium and vitamin D.   # follow up in 4 weeks/x-geva/labs. Discussed with the clinical trials nurses.   No  orders of the defined types were placed in this encounter.  All questions were answered. The patient knows to call the clinic with any problems, questions or concerns.      Cammie Sickle, MD 03/14/2017 5:37 PM

## 2017-03-14 NOTE — Assessment & Plan Note (Addendum)
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 Also on Zytiga plus Xtandi- as per clinical trial. AUG 2018  CT scan C/A/P- stable disease-lymphadenopathy in the pelvis and retroperitoneum; bone scan-stable. PSA July 2018- 10/ STABLE.   # Patient tolerating treatment very well- except for hot flashes/Continue same treatments.  No obvious signs of clinical progression.  # "Lipoma"- left chest wall. Monitor for now.  # Hot flashes- G-1-  On Effexor improved.  # Bone lesions continue Xgeva; no side effects noted. Continue calcium and vitamin D.   # follow up in 4 weeks/x-geva/labs. Discussed with the clinical trials nurses.

## 2017-03-14 NOTE — Progress Notes (Signed)
William Wright returns to clinic today for consideration of cycle 31 treatment on the [facility] [id number] Prostate study receiving daily Enzalutamide, Abiraterone and Prednisone. VS remain stable. CBC and chemistries all within acceptable parameters for treatment. Patient still c/o mild fatigue which has not changed from previous visit, but states his hot flashes have lessened in intensity.  He showed Dr. [name] a quarter-sized soft swelling on his left upper flank which has been there a couple of months.  No pain or problems with this area.  Dr. [name] seemed to think it is a small lipoma.  Plan is to watch it for any changes in size or symptomology.   He returned his medication calendars which verify that he is taking the enzalutamide, abiraterone and prednisone as prescribed. He also returned the completed PK questionnaire and his old bottle of enzalutamide with 8 capsules remaining. The old medication bottle was exchanged in the pharmacy and a new bottle of 120 capsules of enzalutamide 40mg  was dispensed to patient. New medication calendars for cycle 31 were also given to Mr. Laubacher. He reports that he is receiving his Abiraterone 250mg  capsules, #120 monthly from [facility], and he receives his prednisone prescription through a different mail order pharmacy in three month supplies. He will receive an Xgeva injection today and we will schedule his return study appointment on 04/11/17 for labs, MD, and Xgeva. Dr. [name] gave Mr. Craighead the results of his latest CT and bone scans which showed no PD.    Adverse events and attributions were assessed as follows:   Adverse Event Log  Study/Protocol: [facility] [id number] Cycle: 30 adverse events  Event Grade Onset Date Resolved Date Drug Name Enzalutamide + Abiraterone Attribution Treatment Comments  Pain/flank Grade 1 12/21/16 02/14/17  Unrelated Lithotripsy 12/21/16 Occasional with passing of stone fragments  Fatigue Grade 1 12/20/16   Probably   Patient reports it is same  Hot Flashes Grade 1 09/27/16   Possible  Improved some with Effexor  Renal Calculi Grade 1 12/21/16 02/14/17  Unrelated Lithotripsy 12/21/16 Occasionally passing stone fragments  Hematuria   12/21/16  Unrelated  S/p lithotripsy  Dyspepsia   12/21/16      Lipoma Grade 1 01/14/17   Unrelated None Causes no problems  Raynelle Dick, RN BSN "03/14/2017 11:58 AM"  .

## 2017-03-14 NOTE — Progress Notes (Signed)
William Wright returns to clinic today for consideration of cycle 31 treatment on the Alliance [id number] Prostate study receiving daily Enzalutamide, Abiraterone and Prednisone. VS remain stable. CBC and chemistries all within acceptable parameters for treatment. Patient still c/o mild fatigue which has not changed from previous visit, but states his hot flashes have lessened in intensity.  He showed Dr. [name] a quarter-sized soft swelling on his left upper flank which has been there a couple of months.  No pain or problems with this area.  Dr. [name] seemed to think it is a small lipoma.  Plan is to watch it for any changes in size or symptomology.   He returned his medication calendars which verify that he is taking the enzalutamide, abiraterone and prednisone as prescribed. He also returned the completed PK questionnaire and his old bottle of enzalutamide with 8 capsules remaining. The old medication bottle was exchanged in the pharmacy and a new bottle of 120 capsules of enzalutamide 40mg  was dispensed to patient. New medication calendars for cycle 31 were also given to Mr. Marban. He reports that he is receiving his Abiraterone 250mg  capsules, #120 monthly from [facility], and he receives his prednisone prescription through a different mail order pharmacy in three month supplies. He will receive an Xgeva injection today and we will schedule his return study appointment on 04/11/17 for labs, MD, and Xgeva. Dr. [name] gave Mr. Bell the results of his latest CT and bone scans which showed no PD.    Adverse events and attributions were assessed as follows:   Adverse Event Log  Study/Protocol: Alliance [id number] Cycle: 30 adverse events  Event Grade Onset Date Resolved Date Drug Name Enzalutamide + Abiraterone Attribution Treatment Comments  Pain/flank Grade 1 12/21/16 02/14/17  Unrelated Lithotripsy 12/21/16 Occasional with passing of stone fragments  Fatigue Grade 1 12/20/16   Probably   Patient reports it is same  Hot Flashes Grade 1 09/27/16   Possible  Improved some with Effexor  Renal Calculi Grade 1 12/21/16 02/14/17  Unrelated Lithotripsy 12/21/16 Occasionally passing stone fragments  Hematuria   12/21/16  Unrelated  S/p lithotripsy  Dyspepsia   12/21/16      Lipoma Grade 1 01/14/17   Unrelated None Causes no problems  Raynelle Dick, RN BSN "03/14/2017 11:14 AM"  .

## 2017-03-26 ENCOUNTER — Telehealth: Payer: Self-pay | Admitting: Internal Medicine

## 2017-03-26 NOTE — Telephone Encounter (Signed)
Oral Oncology Patient Advocate Encounter  Faxed prescription for Zytiga to [facility].  [place] Patient Advocate [phone] 03/26/2017 10:37 AM

## 2017-04-11 ENCOUNTER — Encounter: Payer: Self-pay | Admitting: *Deleted

## 2017-04-11 ENCOUNTER — Inpatient Hospital Stay: Payer: Medicare Other

## 2017-04-11 ENCOUNTER — Inpatient Hospital Stay: Payer: Medicare Other | Attending: Internal Medicine | Admitting: Internal Medicine

## 2017-04-11 VITALS — BP 128/75 | HR 63 | Temp 97.8°F | Resp 20 | Ht 72.0 in | Wt 164.0 lb

## 2017-04-11 DIAGNOSIS — C61 Malignant neoplasm of prostate: Secondary | ICD-10-CM

## 2017-04-11 DIAGNOSIS — C7951 Secondary malignant neoplasm of bone: Secondary | ICD-10-CM | POA: Diagnosis not present

## 2017-04-11 DIAGNOSIS — Z7952 Long term (current) use of systemic steroids: Secondary | ICD-10-CM | POA: Insufficient documentation

## 2017-04-11 DIAGNOSIS — Z006 Encounter for examination for normal comparison and control in clinical research program: Secondary | ICD-10-CM | POA: Diagnosis not present

## 2017-04-11 DIAGNOSIS — Z79818 Long term (current) use of other agents affecting estrogen receptors and estrogen levels: Secondary | ICD-10-CM | POA: Insufficient documentation

## 2017-04-11 LAB — CBC WITH DIFFERENTIAL/PLATELET
Basophils Absolute: 0 10*3/uL (ref 0–0.1)
Basophils Relative: 1 %
EOS PCT: 1 %
Eosinophils Absolute: 0 10*3/uL (ref 0–0.7)
HEMATOCRIT: 41.5 % (ref 40.0–52.0)
HEMOGLOBIN: 14.5 g/dL (ref 13.0–18.0)
LYMPHS ABS: 1.5 10*3/uL (ref 1.0–3.6)
LYMPHS PCT: 36 %
MCH: 34.2 pg — AB (ref 26.0–34.0)
MCHC: 35 g/dL (ref 32.0–36.0)
MCV: 97.7 fL (ref 80.0–100.0)
Monocytes Absolute: 0.5 10*3/uL (ref 0.2–1.0)
Monocytes Relative: 13 %
NEUTROS ABS: 2.1 10*3/uL (ref 1.4–6.5)
NEUTROS PCT: 49 %
Platelets: 201 10*3/uL (ref 150–440)
RBC: 4.25 MIL/uL — AB (ref 4.40–5.90)
RDW: 13.6 % (ref 11.5–14.5)
WBC: 4.2 10*3/uL (ref 3.8–10.6)

## 2017-04-11 LAB — COMPREHENSIVE METABOLIC PANEL
ALT: 11 U/L — ABNORMAL LOW (ref 17–63)
AST: 16 U/L (ref 15–41)
Albumin: 4.3 g/dL (ref 3.5–5.0)
Alkaline Phosphatase: 54 U/L (ref 38–126)
Anion gap: 8 (ref 5–15)
BUN: 20 mg/dL (ref 6–20)
CHLORIDE: 105 mmol/L (ref 101–111)
CO2: 26 mmol/L (ref 22–32)
Calcium: 9.3 mg/dL (ref 8.9–10.3)
Creatinine, Ser: 0.93 mg/dL (ref 0.61–1.24)
GFR calc Af Amer: >60 mL/min (ref >60)
Glucose, Bld: 78 mg/dL (ref 65–99)
POTASSIUM: 4.2 mmol/L (ref 3.5–5.1)
Sodium: 139 mmol/L (ref 135–145)
Total Bilirubin: 0.8 mg/dL (ref 0.3–1.2)
Total Protein: 7.2 g/dL (ref 6.5–8.1)

## 2017-04-11 LAB — PSA: Prostatic Specific Antigen: 9.75 ng/mL — ABNORMAL HIGH (ref 0.00–4.00)

## 2017-04-11 MED ORDER — DENOSUMAB 120 MG/1.7ML ~~LOC~~ SOLN
120.0000 mg | Freq: Once | SUBCUTANEOUS | Status: AC
  Administered 2017-04-11: 120 mg via SUBCUTANEOUS
  Filled 2017-04-11: qty 1.7

## 2017-04-11 MED ORDER — INV-ENZALUTAMIDE 40 MG CAPS #120 ALLIANCE A031201: 160.0000 mg | ORAL_CAPSULE | Freq: Every day | ORAL | 0 refills | Status: DC

## 2017-04-11 NOTE — Progress Notes (Signed)
William Wright returns to clinic today for consideration of cycle 32 treatment on the Alliance [id number] Prostate study receiving daily Enzalutamide, Abiraterone and Prednisone. VS remain stable. CBC and chemistries all within acceptable parameters for treatment. Patient still reports experiencing mild fatigue and hot flashes, which have been ongoing since he began treatment, "and are about the same."   He returned his medication calendars and Enzalutamide pill bottle with 8 capsules remaining. Med calendars and pill count verifies that he is taking the enzalutamide, abiraterone and prednisone as prescribed. He also returned the completed PK questionnaire as well. Dr. [name] in to examine patient, and patient has some questions about the role his lymphatic system plays in his cancer. This was explained to patient by Dr. [name] to patient's satisfaction. Old medication bottle returned to the pharmacy and a new bottle of 120 capsules of enzalutamide 40mg  was dispensed to patient. New medication calendars and PK questionnaire for cycle 32 were also given to William Wright. William Wright verifies that he is receiving  Abiraterone 250mg  capsules, #120 monthly from [facility], and he receives his prednisone prescription through a different mail order pharmacy in three month supplies. He will receive an Xgeva injection today and we will schedule his return study appointment on 05/09/17 for labs, MD, and Xgeva.  Adverse events and attributions were assessed as follows:  Adverse Event Log  Study/Protocol: Alliance [id number] Cycle: 31 adverse events  Event Grade Onset Date Resolved Date Drug Name Enzalutamide + Abiraterone Attribution Treatment Comments  Fatigue Grade 1 12/20/16   Probably  Patient reports it is same  Hot Flashes Grade 1 09/27/16   Possible  Improved some with Effexor  Lipoma Grade 1 01/14/17   Unrelated None Causes no problems  [name], BSN, MHA, OCN 04/11/2017  11:54 AM .

## 2017-04-11 NOTE — Progress Notes (Signed)
Patient here for follow-up for prostate cancer. He has no medical complaints.

## 2017-04-11 NOTE — Assessment & Plan Note (Addendum)
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 Also on Zytiga plus Xtandi- as per clinical trial. AUG 2018  CT scan C/A/P- stable disease-lymphadenopathy in the pelvis and retroperitoneum;  bone scan-stable. PSA July 2018- 9.3 STABLE/improving.   # Patient tolerating treatment very well- except for hot flashes/Continue same treatments.  No obvious signs of clinical progression.  # Hot flashes- G-1-  On Effexor improved.  # Bone lesions continue Xgeva; no side effects noted. Continue calcium and vitamin D.   # follow up in 4 weeks/x-geva/labs. Discussed with the clinical trials nurses.

## 2017-04-11 NOTE — Progress Notes (Signed)
[place] OFFICE PROGRESS NOTE  Patient Care Team: System, Provider Not In as PCP - General Royston Cowper, MD (Urology)  Cancer of prostate [facility])   Staging form: Prostate, AJCC 7th Edition     Clinical: Stage IV (T2, M1) - Signed by Evlyn Kanner, NP on 01/07/2015    Oncology History   # FEB 2014- Prostate cancer Stage II [T2N0]; PA-18;   # May 2015-STAGE IV;  PSA 90 on ADT; CT/Bone scan-extensive mets;  casodex+ Trelstar  # June 2016- Castration resistant prostate cancer/[facility]iance protocol- ZYTIGA plus minus XTANDI; June 28th-CT-C/A/P- Stable Retrocrural/RP/Pelvic LN;Bone scan- Stable sclerotic lesions   # Xgeva     Cancer of prostate ([place])   08/17/2012 Initial Diagnosis    Cancer of prostate, stage II      12/03/2013 Progression         10/13/2014 Progression          Prostate cancer metastatic to multiple sites ([place])   01/19/2016 Initial Diagnosis    Prostate cancer metastatic to multiple sites [facility])        INTERVAL HISTORY:  William Wright 66 y.o.  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   Chronic mild hot flashes not any worse; He continues to be on Effexor. Denies any unusual shortness of breath or cough. No chest pain or shortness of breath. No seizures. No weight loss. No bone pain.  Denies any jaw pain.  No swelling in the legs. No nausea or vomiting. Denies any significant fatigue.  REVIEW OF SYSTEMS:  A complete 10 point review of system is done which is negative except mentioned above/history of present illness.   PAST MEDICAL HISTORY :  Past Medical History:  Diagnosis Date  . Cancer of prostate ([place]) 11/18/2014  . Prostate cancer ([place])     PAST SURGICAL HISTORY :   Past Surgical History:  Procedure Laterality Date  . EXTRACORPOREAL SHOCK WAVE LITHOTRIPSY Left 05/20/2015   Procedure: EXTRACORPOREAL SHOCK WAVE  LITHOTRIPSY (ESWL);  Surgeon: Royston Cowper, MD;  Location: [facility] ORS;  Service: Urology;  Laterality: Left;  . EXTRACORPOREAL SHOCK WAVE LITHOTRIPSY Right 12/21/2016   Procedure: EXTRACORPOREAL SHOCK WAVE LITHOTRIPSY (ESWL);  Surgeon: Royston Cowper, MD;  Location: [facility] ORS;  Service: Urology;  Laterality: Right;    FAMILY HISTORY :  No family history on file.  SOCIAL HISTORY:   Social History  Substance Use Topics  . Smoking status: Never Smoker  . Smokeless tobacco: Never Used  . Alcohol use No    ALLERGIES:  is allergic to no known allergies.  MEDICATIONS:  Current Outpatient Prescriptions  Medication Sig Dispense Refill  . abiraterone Acetate (ZYTIGA) 250 MG tablet Take 4 tablets (1,000 mg total) by mouth daily. Take on an empty stomach 1 hour before or 2 hours after a meal 120 tablet 4  . Investigational enzalutamide 40 MG capsule [facility]ANCE [id number] Take 4 capsules (160 mg total) by mouth daily. Take with or without food.Swallow whole. Do not crush, or open capsules. 120 capsule 0  . predniSONE (DELTASONE) 5 MG tablet Take 1 tablet (5 mg total) by mouth 2 (two) times daily. 60 tablet 0  . tamsulosin (FLOMAX) 0.4 MG CAPS capsule Take 1 capsule (0.4 mg total) by mouth daily. 30 capsule 11  . Triptorelin Pamoate (TRELSTAR) 22.5 MG injection Inject 22.5 mg into the muscle every 6 (six) months.    Marland Kitchen  venlafaxine XR (EFFEXOR-XR) 37.5 MG 24 hr capsule Take 1 capsule (37.5 mg total) by mouth daily with breakfast. 30 capsule 11  . NUCYNTA 50 MG tablet Take 1 tablet (50 mg total) by mouth every 6 (six) hours as needed for moderate pain. 1 TO 2 TABS Q 6 HOURS PRN PAIN (Patient not taking: Reported on 04/11/2017) 30 tablet 0  . valACYclovir (VALTREX) 1000 MG tablet Take 1 tablet (1,000 mg total) by mouth 2 (two) times daily. (Patient not taking: Reported on 03/14/2017) 60 tablet 6   No current facility-administered medications for this visit.     PHYSICAL EXAMINATION: ECOG PERFORMANCE STATUS:  0 - Asymptomatic  BP 128/75   Pulse 63   Temp 97.8 F (36.6 C) (Tympanic)   Resp 20   Ht 6' (1.829 m)   Wt 164 lb (74.4 kg)   BMI 22.24 kg/m   Filed Weights   04/11/17 0953  Weight: 164 lb (74.4 kg)    GENERAL: Well-nourished well-developed; Alert, no distress and comfortable. He is Alone. EYES: no pallor or icterus OROPHARYNX: no thrush or ulceration; good dentition  NECK: supple, no masses felt LYMPH:  no palpable lymphadenopathy in the cervical, axillary or inguinal regions LUNGS: clear to auscultation and  No wheeze or crackles HEART/CVS: regular rate & rhythm and no murmurs; No lower extremity edema ABDOMEN:abdomen soft, non-tender and normal bowel sounds Musculoskeletal:no cyanosis of digits and no clubbing;  PSYCH: alert & oriented x 3 with fluent speech NEURO: no focal motor/sensory deficits SKIN:  no rashes or significant lesions  LABORATORY DATA:  I have reviewed the data as listed    Component Value Date/Time   NA 139 04/11/2017 0935   NA 137 11/12/2014 1415   K 4.2 04/11/2017 0935   K 4.1 11/12/2014 1415   CL 105 04/11/2017 0935   CL 105 11/12/2014 1415   CO2 26 04/11/2017 0935   CO2 24 11/12/2014 1415   GLUCOSE 78 04/11/2017 0935   GLUCOSE 98 11/12/2014 1415   BUN 20 04/11/2017 0935   BUN 24 (H) 11/12/2014 1415   CREATININE 0.93 04/11/2017 0935   CREATININE 1.02 11/12/2014 1415   CALCIUM 9.3 04/11/2017 0935   CALCIUM 9.2 11/12/2014 1415   PROT 7.2 04/11/2017 0935   PROT 7.4 11/12/2014 1415   ALBUMIN 4.3 04/11/2017 0935   ALBUMIN 4.4 11/12/2014 1415   AST 16 04/11/2017 0935   AST 23 11/12/2014 1415   ALT 11 (L) 04/11/2017 0935   ALT 16 (L) 11/12/2014 1415   ALKPHOS 54 04/11/2017 0935   ALKPHOS 103 11/12/2014 1415   BILITOT 0.8 04/11/2017 0935   BILITOT 0.4 11/12/2014 1415   GFRNONAA >60 04/11/2017 0935   GFRNONAA >60 11/12/2014 1415   GFRAA >60 04/11/2017 0935   GFRAA >60 11/12/2014 1415    No results found for: SPEP, UPEP  Lab Results   Component Value Date   WBC 4.2 04/11/2017   NEUTROABS 2.1 04/11/2017   HGB 14.5 04/11/2017   HCT 41.5 04/11/2017   MCV 97.7 04/11/2017   PLT 201 04/11/2017      Chemistry      Component Value Date/Time   NA 139 04/11/2017 0935   NA 137 11/12/2014 1415   K 4.2 04/11/2017 0935   K 4.1 11/12/2014 1415   CL 105 04/11/2017 0935   CL 105 11/12/2014 1415   CO2 26 04/11/2017 0935   CO2 24 11/12/2014 1415   BUN 20 04/11/2017 0935   BUN 24 (H) 11/12/2014 1415  CREATININE 0.93 04/11/2017 0935   CREATININE 1.02 11/12/2014 1415      Component Value Date/Time   CALCIUM 9.3 04/11/2017 0935   CALCIUM 9.2 11/12/2014 1415   ALKPHOS 54 04/11/2017 0935   ALKPHOS 103 11/12/2014 1415   AST 16 04/11/2017 0935   AST 23 11/12/2014 1415   ALT 11 (L) 04/11/2017 0935   ALT 16 (L) 11/12/2014 1415   BILITOT 0.8 04/11/2017 0935   BILITOT 0.4 11/12/2014 1415      Results for Dutkiewicz, PHILLIP L (MRN [id number]) as of 02/14/2017 10:32  Ref. Range 09/27/2016 09:45 10/25/2016 09:04 11/23/2016 09:28 12/20/2016 13:29 01/18/2017 10:23  PSA Latest Ref Range: 0.00 - 4.00 ng/mL 12.23 (H) 9.49 (H) 9.29 (H)    Prostatic Specific Antigen Latest Ref Range: 0.00 - 4.00 ng/mL    13.26 (H) 10.46 (H)   IMPRESSION: More apparent sclerotic lesion within the medial left sixth rib when compared to prior CT however lesion was demonstrated on prior bone scan.  Otherwise stable findings within the chest, abdomen and pelvis.  4.7 cm ascending thoracic aortic aneurysm. Recommend semi-annual imaging followup by CTA or MRA and referral to cardiothoracic surgery if not already obtained. This recommendation follows 2010 ACCF/AHA/AATS/ACR/ASA/SCA/SCAI/SIR/STS/SVM Guidelines for the Diagnosis and Management of Patients With Thoracic Aortic Disease. Circulation. 2010; 121: H299-M426   Electronically Signed   By: Lovey Newcomer M.D.   On: 03/09/2017 12:04 -----------------------------------------------------------     IMPRESSION: 1. No new skeletal lesions. 2. LEFT sixth rib lesion decreased in intensity.   Electronically Signed   By: Suzy Bouchard M.D.   On: 03/09/2017 12:55  Results for CORDARRYL, MONRREAL (MRN [id number]) as of 04/11/2017 10:19  Ref. Range 11/23/2016 09:28 12/20/2016 13:29 01/18/2017 10:23 02/14/2017 09:27 03/14/2017 09:54  PSA Latest Ref Range: 0.00 - 4.00 ng/mL 9.29 (H)      Prostatic Specific Antigen Latest Ref Range: 0.00 - 4.00 ng/mL  13.26 (H) 10.46 (H) 12.49 (H) 9.66 (H)     RADIOGRAPHIC STUDIES: I have personally reviewed the radiological images as listed and agreed with the findings in the report. No results found.   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 Also on Zytiga plus Xtandi- as per clinical trial. AUG 2018  CT scan C/A/P- stable disease-lymphadenopathy in the pelvis and retroperitoneum;  bone scan-stable. PSA July 2018- 9.3 STABLE/improving.   # Patient tolerating treatment very well- except for hot flashes/Continue same treatments.  No obvious signs of clinical progression.  # Hot flashes- G-1-  On Effexor improved.  # Bone lesions continue Xgeva; no side effects noted. Continue calcium and vitamin D.   # follow up in 4 weeks/x-geva/labs. Discussed with the clinical trials nurses.   No orders of the defined types were placed in this encounter.  All questions were answered. The patient knows to call the clinic with any problems, questions or concerns.      Cammie Sickle, MD 04/11/2017 10:27 AM

## 2017-04-12 ENCOUNTER — Telehealth: Payer: Self-pay | Admitting: *Deleted

## 2017-04-12 NOTE — Telephone Encounter (Signed)
T/C made to William Wright to inform of his current PSA level of 9.75. Patient voices satisfaction with this level, but states he wishes it were lower. [name], BSN, MHA, OCN 04/12/2017 2:39 PM

## 2017-04-15 ENCOUNTER — Other Ambulatory Visit: Payer: Self-pay | Admitting: Internal Medicine

## 2017-04-15 DIAGNOSIS — C61 Malignant neoplasm of prostate: Secondary | ICD-10-CM

## 2017-04-19 ENCOUNTER — Other Ambulatory Visit: Payer: Self-pay

## 2017-04-19 DIAGNOSIS — C61 Malignant neoplasm of prostate: Secondary | ICD-10-CM

## 2017-04-19 MED ORDER — PREDNISONE 5 MG PO TABS: 5.0000 mg | ORAL_TABLET | Freq: Two times a day (BID) | ORAL | 0 refills | Status: DC

## 2017-05-09 ENCOUNTER — Encounter: Payer: Self-pay | Admitting: Internal Medicine

## 2017-05-09 ENCOUNTER — Inpatient Hospital Stay: Payer: Medicare Other

## 2017-05-09 ENCOUNTER — Other Ambulatory Visit: Payer: Self-pay

## 2017-05-09 ENCOUNTER — Encounter: Payer: Self-pay | Admitting: *Deleted

## 2017-05-09 ENCOUNTER — Inpatient Hospital Stay: Payer: Medicare Other | Attending: Internal Medicine | Admitting: Internal Medicine

## 2017-05-09 VITALS — BP 125/84 | HR 65 | Temp 96.8°F | Wt 164.0 lb

## 2017-05-09 DIAGNOSIS — C61 Malignant neoplasm of prostate: Secondary | ICD-10-CM | POA: Insufficient documentation

## 2017-05-09 DIAGNOSIS — R6889 Other general symptoms and signs: Secondary | ICD-10-CM | POA: Insufficient documentation

## 2017-05-09 DIAGNOSIS — C7951 Secondary malignant neoplasm of bone: Secondary | ICD-10-CM

## 2017-05-09 DIAGNOSIS — C775 Secondary and unspecified malignant neoplasm of intrapelvic lymph nodes: Secondary | ICD-10-CM | POA: Diagnosis not present

## 2017-05-09 DIAGNOSIS — Z79899 Other long term (current) drug therapy: Secondary | ICD-10-CM | POA: Diagnosis not present

## 2017-05-09 DIAGNOSIS — Z7952 Long term (current) use of systemic steroids: Secondary | ICD-10-CM | POA: Insufficient documentation

## 2017-05-09 DIAGNOSIS — Z006 Encounter for examination for normal comparison and control in clinical research program: Secondary | ICD-10-CM | POA: Diagnosis not present

## 2017-05-09 DIAGNOSIS — M25512 Pain in left shoulder: Secondary | ICD-10-CM

## 2017-05-09 DIAGNOSIS — I712 Thoracic aortic aneurysm, without rupture: Secondary | ICD-10-CM | POA: Insufficient documentation

## 2017-05-09 LAB — COMPREHENSIVE METABOLIC PANEL
ALK PHOS: 55 U/L (ref 38–126)
ALT: 11 U/L — AB (ref 17–63)
AST: 14 U/L — AB (ref 15–41)
Albumin: 4.2 g/dL (ref 3.5–5.0)
Anion gap: 10 (ref 5–15)
BILIRUBIN TOTAL: 0.8 mg/dL (ref 0.3–1.2)
BUN: 22 mg/dL — AB (ref 6–20)
CALCIUM: 9.5 mg/dL (ref 8.9–10.3)
CHLORIDE: 102 mmol/L (ref 101–111)
CO2: 26 mmol/L (ref 22–32)
CREATININE: 0.92 mg/dL (ref 0.61–1.24)
GFR calc Af Amer: >60 mL/min (ref >60)
Glucose, Bld: 103 mg/dL — ABNORMAL HIGH (ref 65–99)
Potassium: 4.2 mmol/L (ref 3.5–5.1)
Sodium: 138 mmol/L (ref 135–145)
Total Protein: 7.1 g/dL (ref 6.5–8.1)

## 2017-05-09 LAB — CBC WITH DIFFERENTIAL/PLATELET
BASOS ABS: 0.1 10*3/uL (ref 0–0.1)
Basophils Relative: 1 %
Eosinophils Absolute: 0.1 10*3/uL (ref 0–0.7)
Eosinophils Relative: 2 %
HEMATOCRIT: 42 % (ref 40.0–52.0)
HEMOGLOBIN: 14.3 g/dL (ref 13.0–18.0)
LYMPHS ABS: 1.5 10*3/uL (ref 1.0–3.6)
LYMPHS PCT: 31 %
MCH: 34.3 pg — AB (ref 26.0–34.0)
MCHC: 34.1 g/dL (ref 32.0–36.0)
MCV: 100.5 fL — AB (ref 80.0–100.0)
Monocytes Absolute: 0.5 10*3/uL (ref 0.2–1.0)
Monocytes Relative: 11 %
NEUTROS ABS: 2.7 10*3/uL (ref 1.4–6.5)
Neutrophils Relative %: 55 %
PLATELETS: 208 10*3/uL (ref 150–440)
RBC: 4.18 MIL/uL — AB (ref 4.40–5.90)
RDW: 13.6 % (ref 11.5–14.5)
WBC: 4.9 10*3/uL (ref 3.8–10.6)

## 2017-05-09 LAB — PSA: PROSTATIC SPECIFIC ANTIGEN: 9.74 ng/mL — AB (ref 0.00–4.00)

## 2017-05-09 MED ORDER — DENOSUMAB 120 MG/1.7ML ~~LOC~~ SOLN
120.0000 mg | Freq: Once | SUBCUTANEOUS | Status: AC
  Administered 2017-05-09: 120 mg via SUBCUTANEOUS
  Filled 2017-05-09: qty 1.7

## 2017-05-09 NOTE — Progress Notes (Signed)
Patient here for follow up with labs and Xgeva. He states that he is feeling well and denies having any pain.

## 2017-05-09 NOTE — Progress Notes (Signed)
William Wright returns to clinic today for consideration of cycle 33 treatment on the [facility] [id number] Prostate study receiving daily Enzalutamide, Abiraterone and Prednisone. VS remain stable. CBC and chemistries all within acceptable parameters for treatment. Patient still reports experiencing mild fatigue and hot flashes, which have been ongoing and essentially unchanged since beginning treatment. This morning he reports some new, intermittent left shoulder pain and states he is using the same cream he used before with his right shoulder. He returned his medication calendars and Enzalutamide pill bottle with 8 capsules remaining. Med calendars and pill count verifies that he is taking the enzalutamide, abiraterone and prednisone as prescribed. He also returned the completed PK questionnaire as well. Dr. [name] in to examine patient and approve continuation of present treatment. Patient discussed with Dr. [name] his concerns about having scans so frequently and asked if they could be stretched out. Informed that we have to follow the schedule outlined in the protocol unless he elects to come off study. Patient states he wants to remain on the study. Old medication bottle returned to the pharmacy and a new bottle of 120 capsules of enzalutamide 40mg  was dispensed to patient. New medication calendars and PK questionnaire for cycle 33 were also given to William Wright. William Wright verifies that he is receiving  Abiraterone 250mg  capsules, #120 monthly from [facility], and he receives his prednisone prescription through a different mail order pharmacy in three month supplies. He will receive an Xgeva injection today and we will schedule his return study appointment on 06/06/17 for labs, MD, and Xgeva. He will have his study CT and bone scan performed prior to his next scheduled visit, preferably on 11/16 or 06/04/17. Adverse events and attributions were assessed as follows:  Adverse Event  Log  Study/Protocol: [facility] [id number] Cycle: 32 adverse events  Event Grade Onset Date Resolved Date Drug Name Enzalutamide + Abiraterone Attribution Treatment Comments  Fatigue Grade 1 12/20/16   Probably  Patient reports it is same  Hot Flashes Grade 1 09/27/16   Possible  Improved some with Effexor  Lipoma Grade 1 01/14/17   Unrelated None Causes no problems  Pain in Left shoulder Grade 1 04/25/17 approx.   Unrelated OTC herbal cream Pt reports new onset of left shoulder pain  [name], BSN, MHA, OCN 05/09/2017  11:54 AM .

## 2017-05-09 NOTE — Progress Notes (Signed)
[place] OFFICE PROGRESS NOTE  Patient Care Team: System, Provider Not In as PCP - General Royston Cowper, MD (Urology)  Cancer of prostate [facility])   Staging form: Prostate, AJCC 7th Edition     Clinical: Stage IV (T2, M1) - Signed by Evlyn Kanner, NP on 01/07/2015    Oncology History   # FEB 2014- Prostate cancer Stage II [T2N0]; PA-18;   # May 2015-STAGE IV;  PSA 90 on ADT; CT/Bone scan-extensive mets;  casodex+ Trelstar  # June 2016- Castration resistant prostate cancer/[facility] protocol- ZYTIGA plus minus XTANDI; June 28th-CT-C/A/P- Stable Retrocrural/RP/Pelvic LN;Bone scan- Stable sclerotic lesions   # Xgeva     Cancer of prostate ([place])   08/17/2012 Initial Diagnosis    Cancer of prostate, stage II      12/03/2013 Progression         10/13/2014 Progression          Prostate cancer metastatic to multiple sites ([place])   01/19/2016 Initial Diagnosis    Prostate cancer metastatic to multiple sites [facility])        INTERVAL HISTORY:  William Wright 66 y.o.  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   Patient denies any unusual weight loss or nausea vomiting or diarrhea. Denies any significant fatigue. Continues to have mild hot flashes. His current on Effexor. Not any worse.  He denies any jaw pain. Denies any headaches or seizure-like activity. He has mild left shoulder pain. Not any worse.  REVIEW OF SYSTEMS:  A complete 10 point review of system is done which is negative except mentioned above/history of present illness.   PAST MEDICAL HISTORY :  Past Medical History:  Diagnosis Date  . Cancer of prostate ([place]) 11/18/2014  . Prostate cancer ([place])     PAST SURGICAL HISTORY :   Past Surgical History:  Procedure Laterality Date  . EXTRACORPOREAL SHOCK WAVE LITHOTRIPSY Left 05/20/2015   Procedure: EXTRACORPOREAL SHOCK WAVE  LITHOTRIPSY (ESWL);  Surgeon: Royston Cowper, MD;  Location: [facility] ORS;  Service: Urology;  Laterality: Left;  . EXTRACORPOREAL SHOCK WAVE LITHOTRIPSY Right 12/21/2016   Procedure: EXTRACORPOREAL SHOCK WAVE LITHOTRIPSY (ESWL);  Surgeon: Royston Cowper, MD;  Location: [facility] ORS;  Service: Urology;  Laterality: Right;    FAMILY HISTORY :  History reviewed. No pertinent family history.  SOCIAL HISTORY:   Social History  Substance Use Topics  . Smoking status: Never Smoker  . Smokeless tobacco: Never Used  . Alcohol use No    ALLERGIES:  is allergic to no known allergies.  MEDICATIONS:  Current Outpatient Prescriptions  Medication Sig Dispense Refill  . abiraterone Acetate (ZYTIGA) 250 MG tablet Take 4 tablets (1,000 mg total) by mouth daily. Take on an empty stomach 1 hour before or 2 hours after a meal 120 tablet 4  . Investigational enzalutamide 40 MG capsule [facility] [id number] Take 4 capsules (160 mg total) by mouth daily. Take with or without food.Swallow whole. Do not crush, or open capsules. 120 capsule 0  . predniSONE (DELTASONE) 5 MG tablet Take 1 tablet (5 mg total) by mouth 2 (two) times daily. 60 tablet 0  . tamsulosin (FLOMAX) 0.4 MG CAPS capsule Take 1 capsule (0.4 mg total) by mouth daily. 30 capsule 11  . Triptorelin Pamoate (TRELSTAR) 22.5 MG injection Inject 22.5 mg into the muscle every 6 (six) months.    . valACYclovir (VALTREX) 1000 MG  tablet Take 1 tablet (1,000 mg total) by mouth 2 (two) times daily. (Patient taking differently: Take 1,000 mg by mouth 2 (two) times daily. ) 60 tablet 6  . venlafaxine XR (EFFEXOR-XR) 37.5 MG 24 hr capsule Take 1 capsule (37.5 mg total) by mouth daily with breakfast. 30 capsule 11  . NUCYNTA 50 MG tablet Take 1 tablet (50 mg total) by mouth every 6 (six) hours as needed for moderate pain. 1 TO 2 TABS Q 6 HOURS PRN PAIN (Patient not taking: Reported on 04/11/2017) 30 tablet 0   No current facility-administered medications for this visit.      PHYSICAL EXAMINATION: ECOG PERFORMANCE STATUS: 0 - Asymptomatic  BP 125/84 (BP Location: Right Arm, Patient Position: Sitting)   Pulse 65   Temp (!) 96.8 F (36 C) (Tympanic)   Wt 164 lb (74.4 kg)   BMI 22.24 kg/m   Filed Weights   05/09/17 1103  Weight: 164 lb (74.4 kg)    GENERAL: Well-nourished well-developed; Alert, no distress and comfortable. He is Alone. EYES: no pallor or icterus OROPHARYNX: no thrush or ulceration; good dentition  NECK: supple, no masses felt LYMPH:  no palpable lymphadenopathy in the cervical, axillary or inguinal regions LUNGS: clear to auscultation and  No wheeze or crackles HEART/CVS: regular rate & rhythm and no murmurs; No lower extremity edema ABDOMEN:abdomen soft, non-tender and normal bowel sounds Musculoskeletal:no cyanosis of digits and no clubbing;  PSYCH: alert & oriented x 3 with fluent speech NEURO: no focal motor/sensory deficits SKIN:  no rashes or significant lesions  LABORATORY DATA:  I have reviewed the data as listed    Component Value Date/Time   NA 138 05/09/2017 1045   NA 137 11/12/2014 1415   K 4.2 05/09/2017 1045   K 4.1 11/12/2014 1415   CL 102 05/09/2017 1045   CL 105 11/12/2014 1415   CO2 26 05/09/2017 1045   CO2 24 11/12/2014 1415   GLUCOSE 103 (H) 05/09/2017 1045   GLUCOSE 98 11/12/2014 1415   BUN 22 (H) 05/09/2017 1045   BUN 24 (H) 11/12/2014 1415   CREATININE 0.92 05/09/2017 1045   CREATININE 1.02 11/12/2014 1415   CALCIUM 9.5 05/09/2017 1045   CALCIUM 9.2 11/12/2014 1415   PROT 7.1 05/09/2017 1045   PROT 7.4 11/12/2014 1415   ALBUMIN 4.2 05/09/2017 1045   ALBUMIN 4.4 11/12/2014 1415   AST 14 (L) 05/09/2017 1045   AST 23 11/12/2014 1415   ALT 11 (L) 05/09/2017 1045   ALT 16 (L) 11/12/2014 1415   ALKPHOS 55 05/09/2017 1045   ALKPHOS 103 11/12/2014 1415   BILITOT 0.8 05/09/2017 1045   BILITOT 0.4 11/12/2014 1415   GFRNONAA >60 05/09/2017 1045   GFRNONAA >60 11/12/2014 1415   GFRAA >60  05/09/2017 1045   GFRAA >60 11/12/2014 1415    No results found for: SPEP, UPEP  Lab Results  Component Value Date   WBC 4.9 05/09/2017   NEUTROABS 2.7 05/09/2017   HGB 14.3 05/09/2017   HCT 42.0 05/09/2017   MCV 100.5 (H) 05/09/2017   PLT 208 05/09/2017      Chemistry      Component Value Date/Time   NA 138 05/09/2017 1045   NA 137 11/12/2014 1415   K 4.2 05/09/2017 1045   K 4.1 11/12/2014 1415   CL 102 05/09/2017 1045   CL 105 11/12/2014 1415   CO2 26 05/09/2017 1045   CO2 24 11/12/2014 1415   BUN 22 (H) 05/09/2017 1045  BUN 24 (H) 11/12/2014 1415   CREATININE 0.92 05/09/2017 1045   CREATININE 1.02 11/12/2014 1415      Component Value Date/Time   CALCIUM 9.5 05/09/2017 1045   CALCIUM 9.2 11/12/2014 1415   ALKPHOS 55 05/09/2017 1045   ALKPHOS 103 11/12/2014 1415   AST 14 (L) 05/09/2017 1045   AST 23 11/12/2014 1415   ALT 11 (L) 05/09/2017 1045   ALT 16 (L) 11/12/2014 1415   BILITOT 0.8 05/09/2017 1045   BILITOT 0.4 11/12/2014 1415      Results for Baillargeon, PHILLIP L (MRN [id number]) as of 05/09/2017 11:35  Ref. Range 12/20/2016 13:29 01/18/2017 10:23 02/14/2017 09:27 03/14/2017 09:54 04/11/2017 09:28  Prostatic Specific Antigen Latest Ref Range: 0.00 - 4.00 ng/mL 13.26 (H) 10.46 (H) 12.49 (H) 9.66 (H) 9.75 (H)    IMPRESSION: More apparent sclerotic lesion within the medial left sixth rib when compared to prior CT however lesion was demonstrated on prior bone scan.  Otherwise stable findings within the chest, abdomen and pelvis.  4.7 cm ascending thoracic aortic aneurysm. Recommend semi-annual imaging followup by CTA or MRA and referral to cardiothoracic surgery if not already obtained. This recommendation follows 2010 ACCF/AHA/AATS/ACR/ASA/SCA/SCAI/SIR/STS/SVM Guidelines for the Diagnosis and Management of Patients With Thoracic Aortic Disease. Circulation. 2010; 121: M841-L244   Electronically Signed   By: Lovey Newcomer M.D.   On: 03/09/2017  12:04 -----------------------------------------------------------    IMPRESSION: 1. No new skeletal lesions. 2. LEFT sixth rib lesion decreased in intensity.   Electronically Signed   By: Suzy Bouchard M.D.   On: 03/09/2017 12:55  Results for MANLEY, FASON (MRN [id number]) as of 04/11/2017 10:19  Ref. Range 11/23/2016 09:28 12/20/2016 13:29 01/18/2017 10:23 02/14/2017 09:27 03/14/2017 09:54  PSA Latest Ref Range: 0.00 - 4.00 ng/mL 9.29 (H)      Prostatic Specific Antigen Latest Ref Range: 0.00 - 4.00 ng/mL  13.26 (H) 10.46 (H) 12.49 (H) 9.66 (H)     RADIOGRAPHIC STUDIES: I have personally reviewed the radiological images as listed and agreed with the findings in the report. No results found.   ASSESSMENT & PLAN:  Prostate cancer metastatic to multiple sites [facility]) Metastatic prostate cancer castrate resistant to the bone- currently on Trelstar q 6 m [last Aug 2018]. Also on Zytiga plus Xtandi- as per clinical trial. AUG 2018  CT scan C/A/P- stable disease- lymphadenopathy in the pelvis and retroperitoneum;  bone scan-stable. PSA OCT 2018- 9.75 STABLE/improving.   # Patient tolerating treatment very well- except for hot flashes/Continue same treatments. No obvious signs of clinical progression. CT/bone scans prior to next visit.   # Hot flashes- G-1-  On Effexor- STABLE.   # Bone lesions continue Xgeva; no side effects noted. Continue calcium and vitamin D.   # follow up in 4 weeks/x-geva/labs; CT/ bone scans prior. Discussed with the clinical trials nurses.   No orders of the defined types were placed in this encounter.  All questions were answered. The patient knows to call the clinic with any problems, questions or concerns.      William Sickle, MD 05/09/2017 1:25 PM

## 2017-05-09 NOTE — Assessment & Plan Note (Addendum)
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 Also on Zytiga plus Xtandi- as per clinical trial. AUG 2018  CT scan C/A/P- stable disease- lymphadenopathy in the pelvis and retroperitoneum;  bone scan-stable. PSA OCT 2018- 9.75 STABLE/improving.   # Patient tolerating treatment very well- except for hot flashes/Continue same treatments. No obvious signs of clinical progression. CT/bone scans prior to next visit.   # Hot flashes- G-1-  On Effexor- STABLE.   # Bone lesions continue Xgeva; no side effects noted. Continue calcium and vitamin D.   # follow up in 4 weeks/x-geva/labs; CT/ bone scans prior. Discussed with the clinical trials nurses.

## 2017-06-01 ENCOUNTER — Encounter
Admission: RE | Admit: 2017-06-01 | Discharge: 2017-06-01 | Disposition: A | Discharge status: Home / Self Care | Payer: Medicare Other | Source: Ambulatory Visit | Attending: Internal Medicine | Admitting: Internal Medicine

## 2017-06-01 ENCOUNTER — Ambulatory Visit
Admission: RE | Admit: 2017-06-01 | Discharge: 2017-06-01 | Disposition: A | Discharge status: Home / Self Care | Payer: Medicare Other | Source: Ambulatory Visit | Attending: Internal Medicine | Admitting: Internal Medicine

## 2017-06-01 DIAGNOSIS — C7951 Secondary malignant neoplasm of bone: Secondary | ICD-10-CM | POA: Diagnosis not present

## 2017-06-01 DIAGNOSIS — C61 Malignant neoplasm of prostate: Secondary | ICD-10-CM

## 2017-06-01 DIAGNOSIS — I712 Thoracic aortic aneurysm, without rupture: Secondary | ICD-10-CM | POA: Insufficient documentation

## 2017-06-01 DIAGNOSIS — R911 Solitary pulmonary nodule: Secondary | ICD-10-CM | POA: Diagnosis not present

## 2017-06-01 MED ORDER — TECHNETIUM TC 99M MEDRONATE IV KIT
23.4200 | PACK | Freq: Once | INTRAVENOUS | Status: AC | PRN
  Administered 2017-06-01: 23.42 via INTRAVENOUS

## 2017-06-01 MED ORDER — IOPAMIDOL (ISOVUE-300) INJECTION 61%
100.0000 mL | Freq: Once | INTRAVENOUS | Status: AC | PRN
  Administered 2017-06-01: 100 mL via INTRAVENOUS

## 2017-06-04 ENCOUNTER — Telehealth: Payer: Self-pay | Admitting: *Deleted

## 2017-06-04 NOTE — Telephone Encounter (Signed)
T/C made to patient to inform of CT and Bone scan results per Dr. [name]. Patient informed that his bone scan was unchanged from previous scan. CT scan shows a new 1.1 cm ground-glass opacity with a new adjacent 0.4cm solid appearing pulmonary nodule in the right lower lobe." The radiologist  Notes this is probably inflammatory, but that we should continue to follow it until resolution. Dr. [name] agrees that this is likely inflammatory changes in the lung, and that he will not change anything unless the patient's PSA begins to rise. Patient will come into clinic on 06/06/17 and discuss this further with Dr. [name] at that time. [name], BSN, MHA, OCN 06/04/2017 4:05 PM

## 2017-06-06 ENCOUNTER — Inpatient Hospital Stay: Payer: Medicare Other | Attending: Internal Medicine

## 2017-06-06 ENCOUNTER — Inpatient Hospital Stay: Payer: Medicare Other

## 2017-06-06 ENCOUNTER — Encounter: Payer: Self-pay | Admitting: *Deleted

## 2017-06-06 ENCOUNTER — Other Ambulatory Visit: Payer: Self-pay

## 2017-06-06 ENCOUNTER — Inpatient Hospital Stay (HOSPITAL_BASED_OUTPATIENT_CLINIC_OR_DEPARTMENT_OTHER): Payer: Medicare Other | Admitting: Internal Medicine

## 2017-06-06 ENCOUNTER — Other Ambulatory Visit: Payer: Self-pay | Admitting: Internal Medicine

## 2017-06-06 DIAGNOSIS — C61 Malignant neoplasm of prostate: Secondary | ICD-10-CM

## 2017-06-06 DIAGNOSIS — C7951 Secondary malignant neoplasm of bone: Secondary | ICD-10-CM

## 2017-06-06 DIAGNOSIS — Z006 Encounter for examination for normal comparison and control in clinical research program: Secondary | ICD-10-CM

## 2017-06-06 DIAGNOSIS — C778 Secondary and unspecified malignant neoplasm of lymph nodes of multiple regions: Secondary | ICD-10-CM

## 2017-06-06 DIAGNOSIS — Z79899 Other long term (current) drug therapy: Secondary | ICD-10-CM | POA: Diagnosis not present

## 2017-06-06 DIAGNOSIS — Z7952 Long term (current) use of systemic steroids: Secondary | ICD-10-CM | POA: Insufficient documentation

## 2017-06-06 LAB — COMPREHENSIVE METABOLIC PANEL
ALBUMIN: 4.1 g/dL (ref 3.5–5.0)
ALK PHOS: 56 U/L (ref 38–126)
ALT: 11 U/L — ABNORMAL LOW (ref 17–63)
AST: 12 U/L — ABNORMAL LOW (ref 15–41)
Anion gap: 6 (ref 5–15)
BUN: 18 mg/dL (ref 6–20)
CALCIUM: 9.4 mg/dL (ref 8.9–10.3)
CHLORIDE: 101 mmol/L (ref 101–111)
CO2: 29 mmol/L (ref 22–32)
CREATININE: 0.98 mg/dL (ref 0.61–1.24)
GFR calc Af Amer: >60 mL/min (ref >60)
Glucose, Bld: 99 mg/dL (ref 65–99)
Potassium: 4.6 mmol/L (ref 3.5–5.1)
Sodium: 136 mmol/L (ref 135–145)
Total Bilirubin: 0.8 mg/dL (ref 0.3–1.2)
Total Protein: 6.7 g/dL (ref 6.5–8.1)

## 2017-06-06 LAB — CBC WITH DIFFERENTIAL/PLATELET
Basophils Absolute: 0 10*3/uL (ref 0–0.1)
Basophils Relative: 1 %
EOS ABS: 0 10*3/uL (ref 0–0.7)
EOS PCT: 1 %
HCT: 40.7 % (ref 40.0–52.0)
Hemoglobin: 13.8 g/dL (ref 13.0–18.0)
LYMPHS ABS: 1.1 10*3/uL (ref 1.0–3.6)
Lymphocytes Relative: 21 %
MCH: 34 pg (ref 26.0–34.0)
MCHC: 33.9 g/dL (ref 32.0–36.0)
MCV: 100.2 fL — ABNORMAL HIGH (ref 80.0–100.0)
MONO ABS: 0.6 10*3/uL (ref 0.2–1.0)
MONOS PCT: 11 %
Neutro Abs: 3.5 10*3/uL (ref 1.4–6.5)
Neutrophils Relative %: 66 %
PLATELETS: 211 10*3/uL (ref 150–440)
RBC: 4.06 MIL/uL — ABNORMAL LOW (ref 4.40–5.90)
RDW: 13.4 % (ref 11.5–14.5)
WBC: 5.2 10*3/uL (ref 3.8–10.6)

## 2017-06-06 LAB — PSA: Prostatic Specific Antigen: 10.42 ng/mL — ABNORMAL HIGH (ref 0.00–4.00)

## 2017-06-06 MED ORDER — DENOSUMAB 120 MG/1.7ML ~~LOC~~ SOLN
120.0000 mg | Freq: Once | SUBCUTANEOUS | Status: AC
  Administered 2017-06-06: 120 mg via SUBCUTANEOUS
  Filled 2017-06-06: qty 1.7

## 2017-06-06 NOTE — Progress Notes (Signed)
William Wright returns to clinic today for consideration of cycle 34 treatment on the [facility] [id number] Prostate study receiving daily Enzalutamide, Abiraterone and Prednisone. VS remain stable. B/P is slightly higher than usual, however patient is getting scan results this morning and maybe a little more anxious. CBC and chemistries all within acceptable parameters for treatment. Patient reports fatigue and hot flashes are unchanged since last visit. He denies pain or any other new symptoms. Patient returned his medication calendars, completed PK form and old Enzalutamide pill bottle with 8 capsules remaining. Med calendars and pill count verifies that he is taking the enzalutamide, abiraterone and prednisone as prescribed.  Dr. [name] in to examine patient and review results of patient's CT and Bone scans. Patient has developed a new right lung lesion, which the radiologist described as "probably inflammatory." Dr. [name] agrees and explains to the patient that we will need to follow this to make sure it is not progressive prostate cancer, or even possibly a new lung cancer. Dr. [name] plans to repeat the CT scan in 8 weeks if the PSA is elevated, or 12 weeks if patient's PSA remains stable. William Wright agrees with this plan. Will call patient on Monday when clinic reopens and let him know what his PSA level is and further discuss the scans. Old medication bottle returned to the pharmacy and a new bottle of 120 capsules of enzalutamide 40mg  was dispensed to patient. New medication calendars and PK questionnaire for cycle 34 were also given to William Wright. William Wright verifies that he is receiving  Abiraterone 250mg  capsules, #120 monthly from [facility], and he receives his prednisone prescription through a different mail order pharmacy in three month supplies. He will receive an Xgeva injection today and we will schedule his return study appointment on 07/04/17 for labs, MD, and Xgeva. Adverse  events and attributions were assessed as follows:  Adverse Event Log  Study/Protocol: [facility] [id number] Cycle: 33 adverse events  Event Grade Onset Date Resolved Date Drug Name Enzalutamide + Abiraterone Attribution Treatment Comments  Fatigue Grade 1 12/20/16   Probably  Patient reports it is same  Hot Flashes Grade 1 09/27/16   Possible  Improved some with Effexor  Lipoma Grade 1 01/14/17   Unrelated None Causes no problems  Pain in Left shoulder Grade 1 04/25/17 approx. 06/06/17 reported  Unrelated OTC herbal cream Pt reports new onset of left shoulder pain  [name], BSN, MHA, OCN 06/06/2017  11:54 AM .

## 2017-06-06 NOTE — Progress Notes (Signed)
[place] OFFICE PROGRESS NOTE  Patient Care Team: System, Provider Not In as PCP - General Royston Cowper, MD (Urology)  Cancer of prostate [facility])   Staging form: Prostate, AJCC 7th Edition     Clinical: Stage IV (T2, M1) - Signed by Evlyn Kanner, NP on 01/07/2015    Oncology History   # FEB 2014- Prostate cancer Stage II [T2N0]; PA-18;   # May 2015-STAGE IV;  PSA 90 on ADT; CT/Bone scan-extensive mets;  casodex+ Trelstar  # June 2016- Castration resistant prostate cancer/[facility] protocol- ZYTIGA plus minus XTANDI; June 28th-CT-C/A/P- Stable Retrocrural/RP/Pelvic LN;Bone scan- Stable sclerotic lesions   # Xgeva     Cancer of prostate ([place])   08/17/2012 Initial Diagnosis    Cancer of prostate, stage II      12/03/2013 Progression         10/13/2014 Progression          Prostate cancer metastatic to multiple sites ([place])   01/19/2016 Initial Diagnosis    Prostate cancer metastatic to multiple sites [facility])        INTERVAL HISTORY:  William Wright 66 y.o.  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  Patient denies any unusual weight loss or nausea vomiting or diarrhea. Denies any significant fatigue. Continues to have mild hot flashes. His current on Effexor. Not any worse.  He denies any jaw pain. Denies any headaches or seizure-like activity.Not any worse.  REVIEW OF SYSTEMS:  A complete 10 point review of system is done which is negative except mentioned above/history of present illness.   PAST MEDICAL HISTORY :  Past Medical History:  Diagnosis Date  . Cancer of prostate ([place]) 11/18/2014  . Prostate cancer ([place])     PAST SURGICAL HISTORY :   Past Surgical History:  Procedure Laterality Date  . EXTRACORPOREAL SHOCK WAVE LITHOTRIPSY Left 05/20/2015   Procedure:  EXTRACORPOREAL SHOCK WAVE LITHOTRIPSY (ESWL);  Surgeon: Royston Cowper, MD;  Location: [facility] ORS;  Service: Urology;  Laterality: Left;  . EXTRACORPOREAL SHOCK WAVE LITHOTRIPSY Right 12/21/2016   Procedure: EXTRACORPOREAL SHOCK WAVE LITHOTRIPSY (ESWL);  Surgeon: Royston Cowper, MD;  Location: [facility] ORS;  Service: Urology;  Laterality: Right;    FAMILY HISTORY :  No family history on file.  SOCIAL HISTORY:   Social History   Tobacco Use  . Smoking status: Never Smoker  . Smokeless tobacco: Never Used  Substance Use Topics  . Alcohol use: No  . Drug use: No    ALLERGIES:  is allergic to no known allergies.  MEDICATIONS:  Current Outpatient Medications  Medication Sig Dispense Refill  . abiraterone Acetate (ZYTIGA) 250 MG tablet Take 4 tablets (1,000 mg total) by mouth daily. Take on an empty stomach 1 hour before or 2 hours after a meal 120 tablet 4  . Investigational enzalutamide 40 MG capsule [facility] [id number] Take 4 capsules (160 mg total) by mouth daily. Take with or without food.Swallow whole. Do not crush, or open capsules. 120 capsule 0  . NUCYNTA 50 MG tablet Take 1 tablet (50 mg total) by mouth every 6 (six) hours as needed for moderate pain. 1 TO 2 TABS Q 6 HOURS PRN PAIN 30 tablet 0  . predniSONE (DELTASONE) 5 MG tablet Take 1 tablet (5 mg total) by mouth 2 (two) times daily. 60 tablet 0  . tamsulosin (  FLOMAX) 0.4 MG CAPS capsule Take 1 capsule (0.4 mg total) by mouth daily. 30 capsule 11  . Triptorelin Pamoate (TRELSTAR) 22.5 MG injection Inject 22.5 mg into the muscle every 6 (six) months.    . valACYclovir (VALTREX) 1000 MG tablet Take 1 tablet (1,000 mg total) by mouth 2 (two) times daily. (Patient taking differently: Take 1,000 mg by mouth 2 (two) times daily. ) 60 tablet 6  . venlafaxine XR (EFFEXOR-XR) 37.5 MG 24 hr capsule Take 1 capsule (37.5 mg total) by mouth daily with breakfast. 30 capsule 11   No current facility-administered medications for this visit.      PHYSICAL EXAMINATION: ECOG PERFORMANCE STATUS: 0 - Asymptomatic  BP 138/78 (BP Location: Left Arm, Patient Position: Sitting)   Pulse (!) 58   Temp 97.7 F (36.5 C) (Tympanic)   Resp 18   Ht 6' (1.829 m)   Wt 164 lb 8 oz (74.6 kg)   BMI 22.31 kg/m   Filed Weights   06/06/17 1103  Weight: 164 lb 8 oz (74.6 kg)    GENERAL: Well-nourished well-developed; Alert, no distress and comfortable. He is Alone. EYES: no pallor or icterus OROPHARYNX: no thrush or ulceration; good dentition  NECK: supple, no masses felt LYMPH:  no palpable lymphadenopathy in the cervical, axillary or inguinal regions LUNGS: clear to auscultation and  No wheeze or crackles HEART/CVS: regular rate & rhythm and no murmurs; No lower extremity edema ABDOMEN:abdomen soft, non-tender and normal bowel sounds Musculoskeletal:no cyanosis of digits and no clubbing;  PSYCH: alert & oriented x 3 with fluent speech NEURO: no focal motor/sensory deficits SKIN:  no rashes or significant lesions  LABORATORY DATA:  I have reviewed the data as listed    Component Value Date/Time   NA 136 06/06/2017 1015   NA 137 11/12/2014 1415   K 4.6 06/06/2017 1015   K 4.1 11/12/2014 1415   CL 101 06/06/2017 1015   CL 105 11/12/2014 1415   CO2 29 06/06/2017 1015   CO2 24 11/12/2014 1415   GLUCOSE 99 06/06/2017 1015   GLUCOSE 98 11/12/2014 1415   BUN 18 06/06/2017 1015   BUN 24 (H) 11/12/2014 1415   CREATININE 0.98 06/06/2017 1015   CREATININE 1.02 11/12/2014 1415   CALCIUM 9.4 06/06/2017 1015   CALCIUM 9.2 11/12/2014 1415   PROT 6.7 06/06/2017 1015   PROT 7.4 11/12/2014 1415   ALBUMIN 4.1 06/06/2017 1015   ALBUMIN 4.4 11/12/2014 1415   AST 12 (Wright) 06/06/2017 1015   AST 23 11/12/2014 1415   ALT 11 (Wright) 06/06/2017 1015   ALT 16 (Wright) 11/12/2014 1415   ALKPHOS 56 06/06/2017 1015   ALKPHOS 103 11/12/2014 1415   BILITOT 0.8 06/06/2017 1015   BILITOT 0.4 11/12/2014 1415   GFRNONAA >60 06/06/2017 1015   GFRNONAA >60  11/12/2014 1415   GFRAA >60 06/06/2017 1015   GFRAA >60 11/12/2014 1415    No results found for: SPEP, UPEP  Lab Results  Component Value Date   WBC 5.2 06/06/2017   NEUTROABS 3.5 06/06/2017   HGB 13.8 06/06/2017   HCT 40.7 06/06/2017   MCV 100.2 (H) 06/06/2017   PLT 211 06/06/2017      Chemistry      Component Value Date/Time   NA 136 06/06/2017 1015   NA 137 11/12/2014 1415   K 4.6 06/06/2017 1015   K 4.1 11/12/2014 1415   CL 101 06/06/2017 1015   CL 105 11/12/2014 1415   CO2 29 06/06/2017 1015  CO2 24 11/12/2014 1415   BUN 18 06/06/2017 1015   BUN 24 (H) 11/12/2014 1415   CREATININE 0.98 06/06/2017 1015   CREATININE 1.02 11/12/2014 1415      Component Value Date/Time   CALCIUM 9.4 06/06/2017 1015   CALCIUM 9.2 11/12/2014 1415   ALKPHOS 56 06/06/2017 1015   ALKPHOS 103 11/12/2014 1415   AST 12 (Wright) 06/06/2017 1015   AST 23 11/12/2014 1415   ALT 11 (Wright) 06/06/2017 1015   ALT 16 (Wright) 11/12/2014 1415   BILITOT 0.8 06/06/2017 1015   BILITOT 0.4 11/12/2014 1415          Results for [place], William Wright (MRN [id number]) as of 06/06/2017 11:40  Ref. Range 01/18/2017 10:23 02/14/2017 09:27 03/14/2017 09:54 04/11/2017 09:28 05/09/2017 10:45  Prostatic Specific Antigen Latest Ref Range: 0.00 - 4.00 ng/mL 10.46 (H) 12.49 (H) 9.66 (H) 9.75 (H) 9.74 (H)     RADIOGRAPHIC STUDIES: I have personally reviewed the radiological images as listed and agreed with the findings in the report. No results found.   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 Also on Zytiga plus Xtandi- as per clinical trial. NOV 2018 CT scan C/A/P- stable disease- lymphadenopathy in the pelvis and retroperitoneum; except RLL- new 64mm/scattered lesions [question inflammation versus malignancy-less likely-see discussion below];  bone scan-stable. PSA oct 2018- pending.  #  Patient tolerating treatment very well- except for hot flashes/Continue same treatments. No obvious signs of clinical progression.   # Hot flashes- G-1-  On Effexor- STABLE.   # Bone lesions continue Xgeva; no side effects noted. Continue calcium and vitamin D.   # follow up in 4 weeks/x-geva/labs; folow up in imaging based on PSA- 2 or 3 months- discussed with clinical trials RN.   Addendum: PSA is fairly stable around 10; I would recommend CT scan-imaging in about 12 weeks/as per the study protocol.   # I reviewed the blood work- with the patient in detail; also reviewed the imaging independently [as summarized above]; and with the patient in detail.     No orders of the defined types were placed in this encounter.  All questions were answered. The patient knows to call the clinic with any problems, questions or concerns.      Cammie Sickle, MD 06/06/2017 11:32 PM

## 2017-06-06 NOTE — Assessment & Plan Note (Addendum)
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 Also on Zytiga plus Xtandi- as per clinical trial. NOV 2018 CT scan C/A/P- stable disease- lymphadenopathy in the pelvis and retroperitoneum; except RLL- new 7mm/scattered lesions [question inflammation versus malignancy-less likely-see discussion below];  bone scan-stable. PSA oct 2018- pending.  # Patient tolerating treatment very well- except for hot flashes/Continue same treatments. No obvious signs of clinical progression.   # Hot flashes- G-1-  On Effexor- STABLE.   # Bone lesions continue Xgeva; no side effects noted. Continue calcium and vitamin D.   # follow up in 4 weeks/x-geva/labs; folow up in imaging based on PSA- 2 or 3 months- discussed with clinical trials RN.   Addendum: PSA is fairly stable around 10; I would recommend CT scan-imaging in about 12 weeks/as per the study protocol.   # I reviewed the blood work- with the patient in detail; also reviewed the imaging independently [as summarized above]; and with the patient in detail.

## 2017-06-11 ENCOUNTER — Telehealth: Payer: Self-pay | Admitting: *Deleted

## 2017-06-11 NOTE — Telephone Encounter (Signed)
T/C made to Mr. Jerred Zaremba at Dr. Aletha Halim request to inform of PSA level, which is 10.42. Dr. [name] is also recommending to wait the full 12 weeks before repeating CT and bone scans. Patient is in agreement with this plan and states his PSA is about the same that it has been running. Informed that it was 9.74 and 9.75 in the previous 2 months. Will see patient back in clinic on 07/04/17. [name], BSN, MHA, OCN 06/11/2017 12:59 PM

## 2017-06-26 ENCOUNTER — Telehealth: Payer: Self-pay | Admitting: Internal Medicine

## 2017-06-26 NOTE — Telephone Encounter (Addendum)
Oral Oncology Patient Advocate Encounter  Received notification from [facility] Patient Assistance program that patient has been successfully enrolled into their program to receive Zytiga from the manufacturer at $0 out of pocket until 07/16/2018.   He knows we will have to re-apply.   Patient knows to call the office with questions or concerns.  Oral Oncology Clinic will continue to follow.  [place] Patient Advocate [phone] 06/26/2017 8:13 AM

## 2017-07-04 ENCOUNTER — Encounter: Payer: Self-pay | Admitting: *Deleted

## 2017-07-04 ENCOUNTER — Inpatient Hospital Stay: Payer: Medicare Other

## 2017-07-04 ENCOUNTER — Telehealth: Payer: Self-pay | Admitting: *Deleted

## 2017-07-04 ENCOUNTER — Inpatient Hospital Stay (HOSPITAL_BASED_OUTPATIENT_CLINIC_OR_DEPARTMENT_OTHER): Payer: Medicare Other | Admitting: Internal Medicine

## 2017-07-04 ENCOUNTER — Inpatient Hospital Stay: Payer: Medicare Other | Attending: Internal Medicine

## 2017-07-04 VITALS — BP 111/76 | HR 93 | Temp 98.4°F | Resp 16 | Wt 166.0 lb

## 2017-07-04 DIAGNOSIS — C61 Malignant neoplasm of prostate: Secondary | ICD-10-CM

## 2017-07-04 DIAGNOSIS — Z79899 Other long term (current) drug therapy: Secondary | ICD-10-CM

## 2017-07-04 DIAGNOSIS — Z7952 Long term (current) use of systemic steroids: Secondary | ICD-10-CM | POA: Insufficient documentation

## 2017-07-04 DIAGNOSIS — Z006 Encounter for examination for normal comparison and control in clinical research program: Secondary | ICD-10-CM

## 2017-07-04 DIAGNOSIS — C7951 Secondary malignant neoplasm of bone: Secondary | ICD-10-CM

## 2017-07-04 LAB — PSA: PROSTATIC SPECIFIC ANTIGEN: 12.09 ng/mL — AB (ref 0.00–4.00)

## 2017-07-04 LAB — COMPREHENSIVE METABOLIC PANEL
ALBUMIN: 4.5 g/dL (ref 3.5–5.0)
ALT: 13 U/L — AB (ref 17–63)
AST: 18 U/L (ref 15–41)
Alkaline Phosphatase: 68 U/L (ref 38–126)
Anion gap: 9 (ref 5–15)
BUN: 16 mg/dL (ref 6–20)
CHLORIDE: 102 mmol/L (ref 101–111)
CO2: 28 mmol/L (ref 22–32)
CREATININE: 0.98 mg/dL (ref 0.61–1.24)
Calcium: 9.4 mg/dL (ref 8.9–10.3)
GFR calc Af Amer: >60 mL/min (ref >60)
GFR calc non Af Amer: >60 mL/min (ref >60)
GLUCOSE: 104 mg/dL — AB (ref 65–99)
Potassium: 3.8 mmol/L (ref 3.5–5.1)
SODIUM: 139 mmol/L (ref 135–145)
Total Bilirubin: 0.9 mg/dL (ref 0.3–1.2)
Total Protein: 7.5 g/dL (ref 6.5–8.1)

## 2017-07-04 LAB — CBC WITH DIFFERENTIAL/PLATELET
Basophils Absolute: 0 10*3/uL (ref 0–0.1)
Basophils Relative: 1 %
EOS ABS: 0 10*3/uL (ref 0–0.7)
EOS PCT: 1 %
HCT: 44.6 % (ref 40.0–52.0)
Hemoglobin: 15.1 g/dL (ref 13.0–18.0)
LYMPHS ABS: 1.4 10*3/uL (ref 1.0–3.6)
LYMPHS PCT: 36 %
MCH: 34.2 pg — AB (ref 26.0–34.0)
MCHC: 33.9 g/dL (ref 32.0–36.0)
MCV: 100.8 fL — AB (ref 80.0–100.0)
MONOS PCT: 12 %
Monocytes Absolute: 0.5 10*3/uL (ref 0.2–1.0)
Neutro Abs: 2 10*3/uL (ref 1.4–6.5)
Neutrophils Relative %: 50 %
PLATELETS: 224 10*3/uL (ref 150–440)
RBC: 4.42 MIL/uL (ref 4.40–5.90)
RDW: 13.5 % (ref 11.5–14.5)
WBC: 3.9 10*3/uL (ref 3.8–10.6)

## 2017-07-04 MED ORDER — DENOSUMAB 120 MG/1.7ML ~~LOC~~ SOLN
120.0000 mg | Freq: Once | SUBCUTANEOUS | Status: AC
  Administered 2017-07-04: 120 mg via SUBCUTANEOUS
  Filled 2017-07-04: qty 1.7

## 2017-07-04 NOTE — Assessment & Plan Note (Addendum)
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 Also on Zytiga plus Xtandi- as per clinical trial.   # NOV 2018 CT scan C/A/P- stable disease- lymphadenopathy in the pelvis and retroperitoneum; except RLL- new 70mm/scattered lesions [question inflammation versus malignancy-less likely-see discussion below];  bone scan-stable. PSA NOV -10 stable.  PSA from today is pending.  # Patient tolerating treatment very well- except for hot flashes/Continue same treatments. No obvious signs of clinical progression.   #Right lower lobe-4 mm nodule/inflammatory changes-asymptomatic.  Will repeat imaging in approximately 12 weeks since last scans.  # "ringing in head"- ?  Secondary to stopping Effexor.  To re-start Effexor; to call us if symptoms get worse.  # Hot flashes- G-1-  On Effexor- STABLE.   # Bone lesions continue Xgeva; no side effects noted. Continue calcium and vitamin D.   # follow up in 4 weeks/x-geva/lab.

## 2017-07-04 NOTE — Progress Notes (Signed)
Antowan Samford returns to clinic today for consideration of cycle 35 treatment on the [facility] [id number] Prostate study receiving daily Enzalutamide, Abiraterone and Prednisone. VS remain stable. B/P is 111/76 this morning. CBC and blood chemistries remain within acceptable parameters for treatment. Patient reports fatigue and hot flashes are unchanged since last visit. He reports experiencing an "odd ringing" in his head that started 2 days ago. States the only thing he has done differently is that he ran out of Effexor 3 days ago and has not gotten it refilled yet. Instructed patient to go ahead and get the Effexor filled and resume taking it because it should not be stopped abruptly. Mr. Aja agrees and will pick the Effexor up today. Patient returned his medication calendars, completed PK form and old Enzalutamide pill bottle with 8 capsules remaining. Med calendars and pill count verifies that he is taking the enzalutamide, abiraterone and prednisone correctly.  Dr. [name] in to examine patient and discusses the new "ringing" type headaches with him. Plan is to watch this form now to see if the ringing resolves on its own, or patient to call and let us know if it does not resolve. Old medication bottle returned to the pharmacy and a new bottle of 120 capsules of enzalutamide 40mg  was dispensed to patient. New medication calendars and PK questionnaire for cycle 34 were also given to Mr. Slinger. Mr. Jurewicz verifies that he is receiving  Abiraterone 250mg  capsules, #120 monthly from [facility], and he receives his prednisone prescription through a different mail order pharmacy in three month supplies. He will receive an Xgeva injection as scheduled today and will return to clinic on 08/01/2017 for labs, MD, and Xgeva. Will schedule follow up CT & Bone scan after next appointment. Patent reports he has back to back cruises planned in February and they fall during his regular visit time that month.  Discussed that we will need to bring him in to clinic a little early that month to ensure that he has enough study drug to last through these cruises. Adverse events and attributions were assessed as follows:  Adverse Event Log  Study/Protocol: [facility] [id number] Cycle: 34 adverse events  Event Grade Onset Date Resolved Date Drug Name Enzalutamide + Abiraterone Attribution Treatment Comments  Fatigue Grade 1 12/20/16   Probably  Patient reports it is same  Hot Flashes Grade 1 09/27/16   Possible  Improved some with Effexor  Lipoma Grade 1 01/14/17   Unrelated None Causes no problems  Ringing in Head Grade 1  (mild) 07/02/17   Unrelated None "odd ringing in frontal area of head" per pt  [name], BSN, MHA, OCN 07/04/2017  11:24 AM .

## 2017-07-04 NOTE — Progress Notes (Signed)
[place] OFFICE PROGRESS NOTE  Patient Care Team: System, Provider Not In as PCP - General Royston Cowper, MD (Urology)  Cancer of prostate [facility])   Staging form: Prostate, AJCC 7th Edition     Clinical: Stage IV (T2, M1) - Signed by Evlyn Kanner, NP on 01/07/2015    Oncology History   # FEB 2014- Prostate cancer Stage II [T2N0]; PA-18;   # May 2015-STAGE IV;  PSA 90 on ADT; CT/Bone scan-extensive mets;  casodex+ Trelstar  # June 2016- Castration resistant prostate cancer/[facility] protocol- ZYTIGA plus minus XTANDI; June 28th-CT-C/A/P- Stable Retrocrural/RP/Pelvic LN;Bone scan- Stable sclerotic lesions   # Xgeva     Cancer of prostate ([place])   08/17/2012 Initial Diagnosis    Cancer of prostate, stage II      12/03/2013 Progression         10/13/2014 Progression          Prostate cancer metastatic to multiple sites ([place])   01/19/2016 Initial Diagnosis    Prostate cancer metastatic to multiple sites [facility])        INTERVAL HISTORY:  William Wright 66 y.o.  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  Patient notes to have "ringing in the head".  Started approximately a week ago when he stopped/ran out of his Effexor.   Patient denies any unusual weight loss or nausea vomiting or diarrhea.  Complains of mild fatigue-not any worse. He denies any jaw pain. Denies any headaches or seizure-like activity. Not any worse.  Continues to have intermittent hot flashes.  REVIEW OF SYSTEMS:  A complete 10 point review of system is done which is negative except mentioned above/history of present illness.   PAST MEDICAL HISTORY :  Past Medical History:  Diagnosis Date  . Cancer of prostate ([place]) 11/18/2014  . Prostate cancer ([place])     PAST SURGICAL HISTORY :   Past Surgical History:  Procedure Laterality Date  . EXTRACORPOREAL SHOCK WAVE  LITHOTRIPSY Left 05/20/2015   Procedure: EXTRACORPOREAL SHOCK WAVE LITHOTRIPSY (ESWL);  Surgeon: Royston Cowper, MD;  Location: [facility] ORS;  Service: Urology;  Laterality: Left;  . EXTRACORPOREAL SHOCK WAVE LITHOTRIPSY Right 12/21/2016   Procedure: EXTRACORPOREAL SHOCK WAVE LITHOTRIPSY (ESWL);  Surgeon: Royston Cowper, MD;  Location: [facility] ORS;  Service: Urology;  Laterality: Right;    FAMILY HISTORY :  No family history on file.  SOCIAL HISTORY:   Social History   Tobacco Use  . Smoking status: Never Smoker  . Smokeless tobacco: Never Used  Substance Use Topics  . Alcohol use: No  . Drug use: No    ALLERGIES:  is allergic to no known allergies.  MEDICATIONS:  Current Outpatient Medications  Medication Sig Dispense Refill  . abiraterone Acetate (ZYTIGA) 250 MG tablet Take 4 tablets (1,000 mg total) by mouth daily. Take on an empty stomach 1 hour before or 2 hours after a meal 120 tablet 4  . Investigational enzalutamide 40 MG capsule [facility]ANCE [id number] Take 4 capsules (160 mg total) by mouth daily. Take with or without food.Swallow whole. Do not crush, or open capsules. 120 capsule 0  . NUCYNTA 50 MG tablet Take 1 tablet (50 mg total) by mouth every 6 (six) hours as needed for moderate pain. 1 TO 2 TABS Q 6 HOURS PRN PAIN 30 tablet 0  . predniSONE (DELTASONE) 5 MG tablet Take 1 tablet (5 mg total)  by mouth 2 (two) times daily. 60 tablet 0  . tamsulosin (FLOMAX) 0.4 MG CAPS capsule Take 1 capsule (0.4 mg total) by mouth daily. 30 capsule 11  . Triptorelin Pamoate (TRELSTAR) 22.5 MG injection Inject 22.5 mg into the muscle every 6 (six) months.    . valACYclovir (VALTREX) 1000 MG tablet Take 1 tablet (1,000 mg total) by mouth 2 (two) times daily. (Patient taking differently: Take 1,000 mg by mouth 2 (two) times daily. ) 60 tablet 6  . venlafaxine XR (EFFEXOR-XR) 37.5 MG 24 hr capsule Take 1 capsule (37.5 mg total) by mouth daily with breakfast. 30 capsule 11   No current  facility-administered medications for this visit.     PHYSICAL EXAMINATION: ECOG PERFORMANCE STATUS: 0 - Asymptomatic  BP 111/76 (BP Location: Left Arm, Patient Position: Sitting)   Pulse 93   Temp 98.4 F (36.9 C) (Tympanic)   Resp 16   Wt 166 lb (75.3 kg)   BMI 22.51 kg/m   Filed Weights   07/04/17 1024  Weight: 166 lb (75.3 kg)    GENERAL: Well-nourished well-developed; Alert, no distress and comfortable. He is Alone. EYES: no pallor or icterus OROPHARYNX: no thrush or ulceration; good dentition  NECK: supple, no masses felt LYMPH:  no palpable lymphadenopathy in the cervical, axillary or inguinal regions LUNGS: clear to auscultation and  No wheeze or crackles HEART/CVS: regular rate & rhythm and no murmurs; No lower extremity edema ABDOMEN:abdomen soft, non-tender and normal bowel sounds Musculoskeletal:no cyanosis of digits and no clubbing;  PSYCH: alert & oriented x 3 with fluent speech NEURO: no focal motor/sensory deficits SKIN:  no rashes or significant lesions  LABORATORY DATA:  I have reviewed the data as listed    Component Value Date/Time   NA 139 07/04/2017 0944   NA 137 11/12/2014 1415   K 3.8 07/04/2017 0944   K 4.1 11/12/2014 1415   CL 102 07/04/2017 0944   CL 105 11/12/2014 1415   CO2 28 07/04/2017 0944   CO2 24 11/12/2014 1415   GLUCOSE 104 (H) 07/04/2017 0944   GLUCOSE 98 11/12/2014 1415   BUN 16 07/04/2017 0944   BUN 24 (H) 11/12/2014 1415   CREATININE 0.98 07/04/2017 0944   CREATININE 1.02 11/12/2014 1415   CALCIUM 9.4 07/04/2017 0944   CALCIUM 9.2 11/12/2014 1415   PROT 7.5 07/04/2017 0944   PROT 7.4 11/12/2014 1415   ALBUMIN 4.5 07/04/2017 0944   ALBUMIN 4.4 11/12/2014 1415   AST 18 07/04/2017 0944   AST 23 11/12/2014 1415   ALT 13 (L) 07/04/2017 0944   ALT 16 (L) 11/12/2014 1415   ALKPHOS 68 07/04/2017 0944   ALKPHOS 103 11/12/2014 1415   BILITOT 0.9 07/04/2017 0944   BILITOT 0.4 11/12/2014 1415   GFRNONAA >60 07/04/2017 0944    GFRNONAA >60 11/12/2014 1415   GFRAA >60 07/04/2017 0944   GFRAA >60 11/12/2014 1415    No results found for: SPEP, UPEP  Lab Results  Component Value Date   WBC 3.9 07/04/2017   NEUTROABS 2.0 07/04/2017   HGB 15.1 07/04/2017   HCT 44.6 07/04/2017   MCV 100.8 (H) 07/04/2017   PLT 224 07/04/2017      Chemistry      Component Value Date/Time   NA 139 07/04/2017 0944   NA 137 11/12/2014 1415   K 3.8 07/04/2017 0944   K 4.1 11/12/2014 1415   CL 102 07/04/2017 0944   CL 105 11/12/2014 1415   CO2 28 07/04/2017  0944   CO2 24 11/12/2014 1415   BUN 16 07/04/2017 0944   BUN 24 (H) 11/12/2014 1415   CREATININE 0.98 07/04/2017 0944   CREATININE 1.02 11/12/2014 1415      Component Value Date/Time   CALCIUM 9.4 07/04/2017 0944   CALCIUM 9.2 11/12/2014 1415   ALKPHOS 68 07/04/2017 0944   ALKPHOS 103 11/12/2014 1415   AST 18 07/04/2017 0944   AST 23 11/12/2014 1415   ALT 13 (L) 07/04/2017 0944   ALT 16 (L) 11/12/2014 1415   BILITOT 0.9 07/04/2017 0944   BILITOT 0.4 11/12/2014 1415          Results for Aber, PHILLIP L (MRN [id number]) as of 06/06/2017 11:40  Ref. Range 01/18/2017 10:23 02/14/2017 09:27 03/14/2017 09:54 04/11/2017 09:28 05/09/2017 10:45  Prostatic Specific Antigen Latest Ref Range: 0.00 - 4.00 ng/mL 10.46 (H) 12.49 (H) 9.66 (H) 9.75 (H) 9.74 (H)     RADIOGRAPHIC STUDIES: I have personally reviewed the radiological images as listed and agreed with the findings in the report. No results found.   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 Also on Zytiga plus Xtandi- as per clinical trial.   # NOV 2018 CT scan C/A/P- stable disease- lymphadenopathy in the pelvis and retroperitoneum; except RLL- new 68mm/scattered lesions [question inflammation versus malignancy-less likely-see discussion below];  bone scan-stable. PSA NOV -10  stable.  PSA from today is pending.  # Patient tolerating treatment very well- except for hot flashes/Continue same treatments. No obvious signs of clinical progression.   #Right lower lobe-4 mm nodule/inflammatory changes-asymptomatic.  Will repeat imaging in approximately 12 weeks since last scans.  # "ringing in head"- ?  Secondary to stopping Effexor.  To re-start Effexor; to call us if symptoms get worse.  # Hot flashes- G-1-  On Effexor- STABLE.   # Bone lesions continue Xgeva; no side effects noted. Continue calcium and vitamin D.   # follow up in 4 weeks/x-geva/lab.    Orders Placed This Encounter  Procedures  . CBC with Differential/Platelet    Standing Status:   Future    Number of Occurrences:   1    Standing Expiration Date:   07/04/2018  . Comprehensive metabolic panel    Standing Status:   Future    Number of Occurrences:   1    Standing Expiration Date:   07/04/2018  . PSA    Standing Status:   Future    Number of Occurrences:   1    Standing Expiration Date:   07/04/2018   All questions were answered. The patient knows to call the clinic with any problems, questions or concerns.      Cammie Sickle, MD 07/04/2017 1:16 PM

## 2017-07-04 NOTE — Telephone Encounter (Signed)
T/C made to Polly Cobia to inform of PSA results. PSA is up just a little at 12.09, and patient states he had hoped it would go down some more. Instructed that Dr. [name] said we will just continue to watch it. [name], BSN, MHA, OCN 07/04/2017 4:48 PM

## 2017-08-01 ENCOUNTER — Encounter: Payer: Self-pay | Admitting: Internal Medicine

## 2017-08-01 ENCOUNTER — Other Ambulatory Visit: Payer: Self-pay

## 2017-08-01 ENCOUNTER — Encounter: Payer: Self-pay | Admitting: *Deleted

## 2017-08-01 ENCOUNTER — Inpatient Hospital Stay: Payer: Medicare Other | Attending: Internal Medicine | Admitting: Internal Medicine

## 2017-08-01 ENCOUNTER — Inpatient Hospital Stay: Payer: Medicare Other | Admitting: *Deleted

## 2017-08-01 ENCOUNTER — Inpatient Hospital Stay: Payer: Medicare Other

## 2017-08-01 VITALS — BP 118/60 | HR 80 | Temp 98.9°F | Resp 18 | Ht 72.0 in | Wt 167.0 lb

## 2017-08-01 DIAGNOSIS — Z006 Encounter for examination for normal comparison and control in clinical research program: Secondary | ICD-10-CM | POA: Diagnosis not present

## 2017-08-01 DIAGNOSIS — N5089 Other specified disorders of the male genital organs: Secondary | ICD-10-CM

## 2017-08-01 DIAGNOSIS — C61 Malignant neoplasm of prostate: Secondary | ICD-10-CM

## 2017-08-01 DIAGNOSIS — R911 Solitary pulmonary nodule: Secondary | ICD-10-CM | POA: Diagnosis not present

## 2017-08-01 DIAGNOSIS — C7951 Secondary malignant neoplasm of bone: Secondary | ICD-10-CM | POA: Diagnosis not present

## 2017-08-01 LAB — COMPREHENSIVE METABOLIC PANEL
ALBUMIN: 4.3 g/dL (ref 3.5–5.0)
ALT: 12 U/L — ABNORMAL LOW (ref 17–63)
AST: 16 U/L (ref 15–41)
Alkaline Phosphatase: 71 U/L (ref 38–126)
Anion gap: 9 (ref 5–15)
BUN: 16 mg/dL (ref 6–20)
CHLORIDE: 103 mmol/L (ref 101–111)
CO2: 28 mmol/L (ref 22–32)
Calcium: 9.5 mg/dL (ref 8.9–10.3)
Creatinine, Ser: 0.96 mg/dL (ref 0.61–1.24)
GFR calc Af Amer: >60 mL/min (ref >60)
GLUCOSE: 73 mg/dL (ref 65–99)
POTASSIUM: 4 mmol/L (ref 3.5–5.1)
SODIUM: 140 mmol/L (ref 135–145)
Total Bilirubin: 0.9 mg/dL (ref 0.3–1.2)
Total Protein: 7.5 g/dL (ref 6.5–8.1)

## 2017-08-01 LAB — CBC WITH DIFFERENTIAL/PLATELET
BASOS ABS: 0 10*3/uL (ref 0–0.1)
Basophils Relative: 1 %
EOS PCT: 1 %
Eosinophils Absolute: 0.1 10*3/uL (ref 0–0.7)
HCT: 43 % (ref 40.0–52.0)
Hemoglobin: 14.7 g/dL (ref 13.0–18.0)
Lymphocytes Relative: 32 %
Lymphs Abs: 1.4 10*3/uL (ref 1.0–3.6)
MCH: 34 pg (ref 26.0–34.0)
MCHC: 34.1 g/dL (ref 32.0–36.0)
MCV: 99.7 fL (ref 80.0–100.0)
MONO ABS: 0.5 10*3/uL (ref 0.2–1.0)
Monocytes Relative: 12 %
Neutro Abs: 2.4 10*3/uL (ref 1.4–6.5)
Neutrophils Relative %: 54 %
PLATELETS: 214 10*3/uL (ref 150–440)
RBC: 4.31 MIL/uL — ABNORMAL LOW (ref 4.40–5.90)
RDW: 13.3 % (ref 11.5–14.5)
WBC: 4.4 10*3/uL (ref 3.8–10.6)

## 2017-08-01 LAB — PSA: PROSTATIC SPECIFIC ANTIGEN: 12.87 ng/mL — AB (ref 0.00–4.00)

## 2017-08-01 MED ORDER — DENOSUMAB 120 MG/1.7ML ~~LOC~~ SOLN
120.0000 mg | Freq: Once | SUBCUTANEOUS | Status: AC
  Administered 2017-08-01: 120 mg via SUBCUTANEOUS
  Filled 2017-08-01: qty 1.7

## 2017-08-01 MED ORDER — PREDNISONE 5 MG PO TABS: 5.0000 mg | ORAL_TABLET | Freq: Two times a day (BID) | ORAL | 0 refills | Status: DC

## 2017-08-01 NOTE — Progress Notes (Signed)
[place] OFFICE PROGRESS NOTE  Patient Care Team: System, Provider Not In as PCP - General Royston Cowper, MD (Urology)  Cancer of prostate [facility])   Staging form: Prostate, AJCC 7th Edition     Clinical: Stage IV (T2, M1) - Signed by Evlyn Kanner, NP on 01/07/2015    Oncology History   # FEB 2014- Prostate cancer Stage II [T2N0]; PA-18;   # May 2015-STAGE IV;  PSA 90 on ADT; CT/Bone scan-extensive mets;  casodex+ Trelstar  # June 2016- Castration resistant prostate cancer/[facility] protocol- ZYTIGA plus minus XTANDI; June 28th-CT-C/A/P- Stable Retrocrural/RP/Pelvic LN;Bone scan- Stable sclerotic lesions   # Xgeva     Cancer of prostate ([place])   08/17/2012 Initial Diagnosis    Cancer of prostate, stage II      12/03/2013 Progression         10/13/2014 Progression          Prostate cancer metastatic to multiple sites ([place])   01/19/2016 Initial Diagnosis    Prostate cancer metastatic to multiple sites [facility])        INTERVAL HISTORY:  William Wright 67 y.o.  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  Patient denies any unusual weight loss or nausea vomiting or diarrhea. He denies any jaw pain. Denies any headaches or seizure-like activity. Not any worse.    He complains of moderate fatigue.  Also complains of moderately severe hot flashes.  REVIEW OF SYSTEMS:  A complete 10 point review of system is done which is negative except mentioned above/history of present illness.   PAST MEDICAL HISTORY :  Past Medical History:  Diagnosis Date  . Cancer of prostate ([place]) 11/18/2014  . Prostate cancer ([place])     PAST SURGICAL HISTORY :   Past Surgical History:  Procedure Laterality Date  . EXTRACORPOREAL SHOCK WAVE LITHOTRIPSY Left 05/20/2015   Procedure: EXTRACORPOREAL SHOCK WAVE LITHOTRIPSY (ESWL);  Surgeon: Royston Cowper, MD;  Location:  [facility] ORS;  Service: Urology;  Laterality: Left;  . EXTRACORPOREAL SHOCK WAVE LITHOTRIPSY Right 12/21/2016   Procedure: EXTRACORPOREAL SHOCK WAVE LITHOTRIPSY (ESWL);  Surgeon: Royston Cowper, MD;  Location: [facility] ORS;  Service: Urology;  Laterality: Right;    FAMILY HISTORY :  History reviewed. No pertinent family history.  SOCIAL HISTORY:   Social History   Tobacco Use  . Smoking status: Never Smoker  . Smokeless tobacco: Never Used  Substance Use Topics  . Alcohol use: No  . Drug use: No    ALLERGIES:  is allergic to no known allergies.  MEDICATIONS:  Current Outpatient Medications  Medication Sig Dispense Refill  . abiraterone Acetate (ZYTIGA) 250 MG tablet Take 4 tablets (1,000 mg total) by mouth daily. Take on an empty stomach 1 hour before or 2 hours after a meal 120 tablet 4  . Investigational enzalutamide 40 MG capsule [facility] [id number] Take 4 capsules (160 mg total) by mouth daily. Take with or without food.Swallow whole. Do not crush, or open capsules. 120 capsule 0  . predniSONE (DELTASONE) 5 MG tablet Take 1 tablet (5 mg total) by mouth 2 (two) times daily. 60 tablet 0  . tamsulosin (FLOMAX) 0.4 MG CAPS capsule Take 1 capsule (0.4 mg total) by mouth daily. 30 capsule 11  . Triptorelin Pamoate (TRELSTAR) 22.5 MG injection Inject 22.5 mg into the muscle every 6 (six) months.    . valACYclovir (VALTREX) 1000 MG  tablet Take 1 tablet (1,000 mg total) by mouth 2 (two) times daily. (Patient taking differently: Take 1,000 mg by mouth 2 (two) times daily. ) 60 tablet 6  . venlafaxine XR (EFFEXOR-XR) 37.5 MG 24 hr capsule Take 1 capsule (37.5 mg total) by mouth daily with breakfast. 30 capsule 11  . NUCYNTA 50 MG tablet Take 1 tablet (50 mg total) by mouth every 6 (six) hours as needed for moderate pain. 1 TO 2 TABS Q 6 HOURS PRN PAIN (Patient not taking: Reported on 08/01/2017) 30 tablet 0  . predniSONE (DELTASONE) 5 MG tablet Take 1 tablet (5 mg total) by mouth 2 (two) times daily. 60  tablet 0   No current facility-administered medications for this visit.     PHYSICAL EXAMINATION: ECOG PERFORMANCE STATUS: 0 - Asymptomatic  BP 118/60   Pulse 80   Temp 98.9 F (37.2 C) (Tympanic)   Resp 18   Ht 6' (1.829 m)   Wt 167 lb (75.8 kg)   BMI 22.65 kg/m   Filed Weights   08/01/17 0959  Weight: 167 lb (75.8 kg)    GENERAL: Well-nourished well-developed; Alert, no distress and comfortable. He is Alone. EYES: no pallor or icterus OROPHARYNX: no thrush or ulceration; good dentition  NECK: supple, no masses felt LYMPH:  no palpable lymphadenopathy in the cervical, axillary or inguinal regions LUNGS: clear to auscultation and  No wheeze or crackles HEART/CVS: regular rate & rhythm and no murmurs; No lower extremity edema ABDOMEN:abdomen soft, non-tender and normal bowel sounds Musculoskeletal:no cyanosis of digits and no clubbing;  PSYCH: alert & oriented x 3 with fluent speech NEURO: no focal motor/sensory deficits SKIN:  no rashes or significant lesions  LABORATORY DATA:  I have reviewed the data as listed    Component Value Date/Time   NA 140 08/01/2017 0925   NA 137 11/12/2014 1415   K 4.0 08/01/2017 0925   K 4.1 11/12/2014 1415   CL 103 08/01/2017 0925   CL 105 11/12/2014 1415   CO2 28 08/01/2017 0925   CO2 24 11/12/2014 1415   GLUCOSE 73 08/01/2017 0925   GLUCOSE 98 11/12/2014 1415   BUN 16 08/01/2017 0925   BUN 24 (H) 11/12/2014 1415   CREATININE 0.96 08/01/2017 0925   CREATININE 1.02 11/12/2014 1415   CALCIUM 9.5 08/01/2017 0925   CALCIUM 9.2 11/12/2014 1415   PROT 7.5 08/01/2017 0925   PROT 7.4 11/12/2014 1415   ALBUMIN 4.3 08/01/2017 0925   ALBUMIN 4.4 11/12/2014 1415   AST 16 08/01/2017 0925   AST 23 11/12/2014 1415   ALT 12 (L) 08/01/2017 0925   ALT 16 (L) 11/12/2014 1415   ALKPHOS 71 08/01/2017 0925   ALKPHOS 103 11/12/2014 1415   BILITOT 0.9 08/01/2017 0925   BILITOT 0.4 11/12/2014 1415   GFRNONAA >60 08/01/2017 0925   GFRNONAA  >60 11/12/2014 1415   GFRAA >60 08/01/2017 0925   GFRAA >60 11/12/2014 1415    No results found for: SPEP, UPEP  Lab Results  Component Value Date   WBC 4.4 08/01/2017   NEUTROABS 2.4 08/01/2017   HGB 14.7 08/01/2017   HCT 43.0 08/01/2017   MCV 99.7 08/01/2017   PLT 214 08/01/2017      Chemistry      Component Value Date/Time   NA 140 08/01/2017 0925   NA 137 11/12/2014 1415   K 4.0 08/01/2017 0925   K 4.1 11/12/2014 1415   CL 103 08/01/2017 0925   CL 105 11/12/2014 1415  CO2 28 08/01/2017 0925   CO2 24 11/12/2014 1415   BUN 16 08/01/2017 0925   BUN 24 (H) 11/12/2014 1415   CREATININE 0.96 08/01/2017 0925   CREATININE 1.02 11/12/2014 1415      Component Value Date/Time   CALCIUM 9.5 08/01/2017 0925   CALCIUM 9.2 11/12/2014 1415   ALKPHOS 71 08/01/2017 0925   ALKPHOS 103 11/12/2014 1415   AST 16 08/01/2017 0925   AST 23 11/12/2014 1415   ALT 12 (L) 08/01/2017 0925   ALT 16 (L) 11/12/2014 1415   BILITOT 0.9 08/01/2017 0925   BILITOT 0.4 11/12/2014 1415              RADIOGRAPHIC STUDIES: I have personally reviewed the radiological images as listed and agreed with the findings in the report. No results found.   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 Also on Zytiga plus Xtandi- as per clinical trial.   # NOV 2018 CT scan C/A/P- stable disease- lymphadenopathy in the pelvis and retroperitoneum; except RLL- new 69mm/scattered lesions [question inflammation versus malignancy-less likely-see discussion below];  bone scan-stable. PSA Dec 2018-  stable.  PSA from today is pending.  # Patient tolerating treatment very well- except for hot flashes/Continue same treatments. No obvious signs of clinical progression.  Discussed the recent update on the study given the lack of benefit of combination treatment.  Patient will likely  have to go on either Zytiga vs. Xtandi.  The choice would be depending upon patient's insurance/side effect profile.  # Hot flashes- recommend ditropan; new prescription given.  #Right lower lobe-4 mm nodule/inflammatory changes-asymptomatic.  Next   # Bone lesions continue Xgeva; no side effects noted. Continue calcium and vitamin D.   #Follow-up 4 weeks labs Xgeva.   No orders of the defined types were placed in this encounter.  All questions were answered. The patient knows to call the clinic with any problems, questions or concerns.      Cammie Sickle, MD 08/14/2017 7:39 PM

## 2017-08-01 NOTE — Progress Notes (Signed)
William Wright returns to clinic today for consideration of cycle 36 treatment on the [facility] [id number] Prostate study receiving daily Enzalutamide, Abiraterone and Prednisone. VS remain stable. B/P is 118/60 this morning. CBC and blood chemistries remain within acceptable parameters for treatment. Patient continues to report fatigue and hot flashes are essentially unchanged. Continues to take Effexor for hot flashes, but does not feel this helps much. Patient returned his medication calendars, completed PK form and old Enzalutamide pill bottle with 8 capsules remaining. Med calendars and pill count verifies that he is taking the enzalutamide, abiraterone and prednisone correctly.  Dr. [name] in to examine patient and discusses the recent study letter we received from [facility] reporting that there is no significant difference in taking enzalutamide, abiraterone and prednisone vs taking enzalutamide alone; therefore they are going to stop the study. Explained treatment options with patient including continuing on single agent Xtandi or Zytiga and Dr. [name] reviewed the side effect profile of each. William Wright already has the Zytiga approved through an outside pharmacy and is leaning toward wanting to just stay on that drug with prednisone. His CT and bone scans are coming up in 3 weeks and this will help determine whether the patient continues with one of these established drugs if disease remains stable, or if we need to look at other options in the event his disease progresses. Per the study, he can continue this treatment until October 14, 2017, and they will no longer supply the study drug after that time point. IRB approved letter  From [facility] was given to patient. Dr. [name] was previously given a similar [facility] [phone] Investigator letter. Dr. [name] is also interested in starting the patient on Ditropan to see if this helps improve the hot flashes, but wants to make sure the study does  not object. He has instructed William Wright to take his Effexor 37,5mg  every other day for the next 2 weeks to wean off of it since it is not really showing much benefit. Old medication bottle returned to the pharmacy and a new bottle of 120 capsules of enzalutamide 40mg  was dispensed to patient. New medication calendars and PK questionnaire for cycle 36 were also given to William Wright. William Wright verifies that he is receiving  Abiraterone 250mg  capsules, #120 monthly from [facility](through [facility]), and he receives his prednisone prescription through a different mail order pharmacy(through Medicare) in three month supplies. He will receive an Xgeva injection as scheduled today and will return to clinic on 08/23/2017 to see MD, and receive a Trelstar injection at that time. He will not receive X-geva at that time, but will resume in March, 2019. Will schedule follow up CT & Bone scan on 08/22/17 and patient to have labs on that day as well. He is planning on leaving 08/24/17 for a cruise and will be gone for 10 days. Will see in clinic early in order to dispense study drug in order to keep patient from running out of it while on the cruise. Reviewed study concommitant medication restrictions and Ditropan is not prohibited. Dr. [name] informed of this. Patient's PSA result is 12.87 today. Adverse events and attributions were assessed as follows:  Adverse Event Log  Study/Protocol: [facility] [id number] Cycle: 35 adverse events  Event Grade Onset Date Resolved Date Drug Name Enzalutamide + Abiraterone Attribution Treatment Comments  Fatigue Grade 1 12/20/16   Probably  Patient reports it is same  Hot Flashes Grade 1 09/27/16   Possible  Improved some with Effexor  Lipoma  Grade 1 01/14/17   Unrelated None Causes no problems  Ringing in Head Grade 1  (mild) 07/02/17   Unrelated None "odd ringing in frontal area of head" per pt  William Wright, BSN, MHA, OCN 08/01/2017  11:24 AM .

## 2017-08-01 NOTE — Progress Notes (Signed)
Patient here for prostate cancer follow-up

## 2017-08-01 NOTE — Assessment & Plan Note (Addendum)
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 Also on Zytiga plus Xtandi- as per clinical trial.   # NOV 2018 CT scan C/A/P- stable disease- lymphadenopathy in the pelvis and retroperitoneum; except RLL- new 62mm/scattered lesions [question inflammation versus malignancy-less likely-see discussion below];  bone scan-stable. PSA Dec 2018-  stable.  PSA from today is pending.  # Patient tolerating treatment very well- except for hot flashes/Continue same treatments. No obvious signs of clinical progression.  Discussed the recent update on the study given the lack of benefit of combination treatment.  Patient will likely have to go on either Zytiga vs. Xtandi.  The choice would be depending upon patient's insurance/side effect profile.  # Hot flashes- recommend ditropan; new prescription given.  #Right lower lobe-4 mm nodule/inflammatory changes-asymptomatic.  Next   # Bone lesions continue Xgeva; no side effects noted. Continue calcium and vitamin D.   #Follow-up 4 weeks labs Xgeva.

## 2017-08-06 ENCOUNTER — Telehealth: Payer: Self-pay | Admitting: Internal Medicine

## 2017-08-06 NOTE — Telephone Encounter (Signed)
Oral Oncology Patient Advocate Encounter   Faxed signed refill request back to [facility] [phone].   [facility] Patient Advocate [phone] 08/06/2017 1:29 PM

## 2017-08-09 ENCOUNTER — Telehealth: Payer: Self-pay | Admitting: *Deleted

## 2017-08-09 NOTE — Telephone Encounter (Signed)
Received t/c from [facility] reporting that his prescription for Ditropan was not at his pharmacy yet. Dr. [name] informed of this and he states to call in Ditropan XR 5mg  qHS, #30 with 4 refills. Prescription for Ditropan called in to [facility]; patient's preferred pharmacy per Dr. [name]. Read back performed by pharmacist to verify Ditropan XR 5mg  po qHS, #30, 4 refills. Patient notified that the prescription has been called in this morning. [name], BSN, MHA, OCN 08/09/2017 8:58 AM

## 2017-08-22 ENCOUNTER — Ambulatory Visit
Admission: RE | Admit: 2017-08-22 | Discharge: 2017-08-22 | Disposition: A | Discharge status: Home / Self Care | Payer: Medicare Other | Source: Ambulatory Visit | Attending: Internal Medicine | Admitting: Internal Medicine

## 2017-08-22 ENCOUNTER — Inpatient Hospital Stay: Payer: Medicare Other | Attending: Internal Medicine

## 2017-08-22 DIAGNOSIS — I700 Atherosclerosis of aorta: Secondary | ICD-10-CM | POA: Insufficient documentation

## 2017-08-22 DIAGNOSIS — I712 Thoracic aortic aneurysm, without rupture: Secondary | ICD-10-CM | POA: Diagnosis not present

## 2017-08-22 DIAGNOSIS — Z5111 Encounter for antineoplastic chemotherapy: Secondary | ICD-10-CM | POA: Diagnosis not present

## 2017-08-22 DIAGNOSIS — C7951 Secondary malignant neoplasm of bone: Secondary | ICD-10-CM | POA: Diagnosis not present

## 2017-08-22 DIAGNOSIS — I714 Abdominal aortic aneurysm, without rupture: Secondary | ICD-10-CM | POA: Diagnosis not present

## 2017-08-22 DIAGNOSIS — I2510 Atherosclerotic heart disease of native coronary artery without angina pectoris: Secondary | ICD-10-CM | POA: Insufficient documentation

## 2017-08-22 DIAGNOSIS — R911 Solitary pulmonary nodule: Secondary | ICD-10-CM | POA: Diagnosis not present

## 2017-08-22 DIAGNOSIS — N5089 Other specified disorders of the male genital organs: Secondary | ICD-10-CM | POA: Insufficient documentation

## 2017-08-22 DIAGNOSIS — C61 Malignant neoplasm of prostate: Secondary | ICD-10-CM

## 2017-08-22 DIAGNOSIS — Z79899 Other long term (current) drug therapy: Secondary | ICD-10-CM | POA: Insufficient documentation

## 2017-08-22 DIAGNOSIS — Z006 Encounter for examination for normal comparison and control in clinical research program: Secondary | ICD-10-CM | POA: Diagnosis not present

## 2017-08-22 DIAGNOSIS — R591 Generalized enlarged lymph nodes: Secondary | ICD-10-CM | POA: Insufficient documentation

## 2017-08-22 DIAGNOSIS — R918 Other nonspecific abnormal finding of lung field: Secondary | ICD-10-CM | POA: Diagnosis not present

## 2017-08-22 LAB — COMPREHENSIVE METABOLIC PANEL
ALK PHOS: 55 U/L (ref 38–126)
ALT: 11 U/L — ABNORMAL LOW (ref 17–63)
AST: 17 U/L (ref 15–41)
Albumin: 3.8 g/dL (ref 3.5–5.0)
Anion gap: 12 (ref 5–15)
BILIRUBIN TOTAL: 0.8 mg/dL (ref 0.3–1.2)
BUN: 17 mg/dL (ref 6–20)
CALCIUM: 8.9 mg/dL (ref 8.9–10.3)
CO2: 24 mmol/L (ref 22–32)
Chloride: 101 mmol/L (ref 101–111)
Creatinine, Ser: 0.89 mg/dL (ref 0.61–1.24)
Glucose, Bld: 81 mg/dL (ref 65–99)
Potassium: 4.3 mmol/L (ref 3.5–5.1)
Sodium: 137 mmol/L (ref 135–145)
TOTAL PROTEIN: 6.4 g/dL — AB (ref 6.5–8.1)

## 2017-08-22 LAB — CBC WITH DIFFERENTIAL/PLATELET
BASOS ABS: 0 10*3/uL (ref 0–0.1)
BASOS PCT: 1 %
Eosinophils Absolute: 0.1 10*3/uL (ref 0–0.7)
Eosinophils Relative: 2 %
HEMATOCRIT: 40.1 % (ref 40.0–52.0)
HEMOGLOBIN: 13.7 g/dL (ref 13.0–18.0)
Lymphocytes Relative: 32 %
Lymphs Abs: 1.3 10*3/uL (ref 1.0–3.6)
MCH: 33.9 pg (ref 26.0–34.0)
MCHC: 34.1 g/dL (ref 32.0–36.0)
MCV: 99.5 fL (ref 80.0–100.0)
Monocytes Absolute: 0.5 10*3/uL (ref 0.2–1.0)
Monocytes Relative: 11 %
NEUTROS ABS: 2.2 10*3/uL (ref 1.4–6.5)
NEUTROS PCT: 54 %
Platelets: 190 10*3/uL (ref 150–440)
RBC: 4.03 MIL/uL — ABNORMAL LOW (ref 4.40–5.90)
RDW: 13 % (ref 11.5–14.5)
WBC: 4.1 10*3/uL (ref 3.8–10.6)

## 2017-08-22 LAB — PSA: Prostatic Specific Antigen: 11.65 ng/mL — ABNORMAL HIGH (ref 0.00–4.00)

## 2017-08-22 MED ORDER — IOPAMIDOL (ISOVUE-300) INJECTION 61%
100.0000 mL | Freq: Once | INTRAVENOUS | Status: AC | PRN
  Administered 2017-08-22: 100 mL via INTRAVENOUS

## 2017-08-22 MED ORDER — TECHNETIUM TC 99M MEDRONATE IV KIT
23.1100 | PACK | Freq: Once | INTRAVENOUS | Status: AC | PRN
  Administered 2017-08-22: 23.11 via INTRAVENOUS

## 2017-08-23 ENCOUNTER — Inpatient Hospital Stay: Payer: Medicare Other

## 2017-08-23 ENCOUNTER — Encounter: Payer: Self-pay | Admitting: *Deleted

## 2017-08-23 ENCOUNTER — Inpatient Hospital Stay (HOSPITAL_BASED_OUTPATIENT_CLINIC_OR_DEPARTMENT_OTHER): Payer: Medicare Other | Admitting: Internal Medicine

## 2017-08-23 ENCOUNTER — Encounter: Payer: Self-pay | Admitting: Internal Medicine

## 2017-08-23 VITALS — BP 125/71 | HR 71 | Temp 96.8°F | Resp 16 | Wt 165.2 lb

## 2017-08-23 DIAGNOSIS — R591 Generalized enlarged lymph nodes: Secondary | ICD-10-CM | POA: Diagnosis not present

## 2017-08-23 DIAGNOSIS — Z5111 Encounter for antineoplastic chemotherapy: Secondary | ICD-10-CM | POA: Diagnosis not present

## 2017-08-23 DIAGNOSIS — C7951 Secondary malignant neoplasm of bone: Secondary | ICD-10-CM | POA: Diagnosis not present

## 2017-08-23 DIAGNOSIS — C61 Malignant neoplasm of prostate: Secondary | ICD-10-CM | POA: Diagnosis not present

## 2017-08-23 DIAGNOSIS — R911 Solitary pulmonary nodule: Secondary | ICD-10-CM

## 2017-08-23 DIAGNOSIS — Z79899 Other long term (current) drug therapy: Secondary | ICD-10-CM

## 2017-08-23 DIAGNOSIS — Z006 Encounter for examination for normal comparison and control in clinical research program: Secondary | ICD-10-CM

## 2017-08-23 DIAGNOSIS — N5089 Other specified disorders of the male genital organs: Secondary | ICD-10-CM

## 2017-08-23 MED ORDER — TRIPTORELIN PAMOATE 22.5 MG IM SUSR
22.5000 mg | Freq: Once | INTRAMUSCULAR | Status: AC
  Administered 2017-08-23: 22.5 mg via INTRAMUSCULAR
  Filled 2017-08-23: qty 22.5

## 2017-08-23 NOTE — Progress Notes (Signed)
William Wright returns to clinic today for consideration of cycle 37 treatment on the [facility] [id number] Prostate study receiving daily Enzalutamide, Abiraterone and Prednisone. VS remain stable. B/P is 125/71 this morning. CBC and blood chemistries remain within acceptable parameters for treatment. Patient continues to report fatigue and hot flashes are a little better. He stopped taking the Effexor and began taking Ditropan approx. 2 weeks ago and states he can already tell a difference. His hot flashes have not resolved completely, but they have definitely improved per patient report. Patient brought in his partially completed medication calendars, completed PK form and old Enzalutamide pill bottle with 32 capsules remaining. Med calendars and pill count verifies that he is taking the enzalutamide, abiraterone and prednisone correctly.  Dr. [name] in to examine patient and discussed patient's CT results from yesterday. The CT and bone scan are stable as far as the lesions we have been following; however the new, questionnable lung leasion has gotten slightly larger, and Dr. [name] is concerned that this is probably progressive disease. He plans to discuss the scan in Tumor conference next week and determine whether this is definite progressive disease, or wheher he should order a PET scan to further evaluate. Dr. [name] explained treatment options with patient including chemotherapy with Docetaxel and/or Radiation therapy since this is a solitary nodule. William Wright listened to the discussion, but stated he will probably not take any chemotherapy. Old medication bottle returned to the patient so he can complete this cycle and a new bottle of 120 capsules of enzalutamide 40mg  was dispensed to patient for the next cycle. He will be on a cruise for the next 10 days and the cycle changes next week on 08/29/17. New medication calendars and PK questionnaire for cycle 36 were also given to William Wright.  William Wright verifies that he is receiving  Abiraterone 250mg  capsules, #120 monthly from [facility] pharmacy(through [facility]), and he receives his prednisone prescription through a different mail order pharmacy(through Medicare) in three month supplies. He will receive a Trelstar injection today, and return to the clinic in 2 weeks to determine how his treatment will proceed and will receive his Xgeva at that time. He is planning on leaving tomorrow morning for a cruise and will be gone for 10 days. Patient's PSA result is 11.65 today, slightly improved since last month. Dr. [name] explains to the patient that this is one factor he uses to evaluate progression in prostate cancer, but the scans are what the study requires [place] to go by. Adverse events and attributions were assessed as follows:  Adverse Event Log  Study/Protocol: [facility] [id number] Cycle: 36 adverse events  Event Grade Onset Date Resolved Date Drug Name Enzalutamide + Abiraterone Attribution Treatment Comments  Fatigue Grade 1 12/20/16   Probably  Patient reports it is same  Hot Flashes Grade 1 09/27/16   Possible  Improved some with Effexor  Lipoma Grade 1 01/14/17   Unrelated None Causes no problems  Ringing in Head Grade 1  (mild) 07/02/17   Unrelated None "odd ringing in frontal area of head" per pt  [name], BSN, MHA, OCN 08/23/2017  11:54 AM .

## 2017-08-23 NOTE — Progress Notes (Signed)
[facility] OFFICE PROGRESS NOTE  Patient Care Team: System, Provider Not In as PCP - General Wolff, Michael R, MD (Urology)  Cancer of prostate (HCC)   Staging form: Prostate, AJCC 7th Edition     Clinical: Stage IV (T2, M1) - Signed by Leslie F Herring, NP on 01/07/2015    Oncology History   # FEB 2014- Prostate cancer Stage II [T2N0]; PA-18;   # May 2015-STAGE IV;  PSA 90 on ADT; CT/Bone scan-extensive mets;  casodex+ Trelstar  # June 2016- Castration resistant prostate cancer/[facility] protocol- ZYTIGA plus minus XTANDI; June 28th-CT-C/A/P- Stable Retrocrural/RP/Pelvic LN;Bone scan- Stable sclerotic lesions   # Xgeva     Cancer of prostate (HCC)   08/17/2012 Initial Diagnosis    Cancer of prostate, stage II      12/03/2013 Progression         10/13/2014 Progression          Prostate cancer metastatic to multiple sites (HCC)   01/19/2016 Initial Diagnosis    Prostate cancer metastatic to multiple sites (HCC)        INTERVAL HISTORY:  William Wright 67 y.o.  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  Patient denies any shortness of breath or cough.  His hot flashes are improved since starting Ditropan.  Patient denies any unusual weight loss or nausea vomiting or diarrhea. He denies any jaw pain. Denies any headaches or seizure-like activity. Not any worse.    REVIEW OF SYSTEMS:  A complete 10 point review of system is done which is negative except mentioned above/history of present illness.   PAST MEDICAL HISTORY :  Past Medical History:  Diagnosis Date  . Cancer of prostate (HCC) 07/2012  . Prostate cancer (HCC)     PAST SURGICAL HISTORY :   Past Surgical History:  Procedure Laterality Date  . EXTRACORPOREAL SHOCK WAVE LITHOTRIPSY Left 05/20/2015   Procedure: EXTRACORPOREAL SHOCK WAVE LITHOTRIPSY (ESWL);  Surgeon: Michael R  Wolff, MD;  Location: [facility] ORS;  Service: Urology;  Laterality: Left;  . EXTRACORPOREAL SHOCK WAVE LITHOTRIPSY Right 12/21/2016   Procedure: EXTRACORPOREAL SHOCK WAVE LITHOTRIPSY (ESWL);  Surgeon: Wolff, Michael R, MD;  Location: [facility] ORS;  Service: Urology;  Laterality: Right;    FAMILY HISTORY :  History reviewed. No pertinent family history.  SOCIAL HISTORY:   Social History   Tobacco Use  . Smoking status: Never Smoker  . Smokeless tobacco: Never Used  Substance Use Topics  . Alcohol use: No  . Drug use: No    ALLERGIES:  is allergic to no known allergies.  MEDICATIONS:  Current Outpatient Medications  Medication Sig Dispense Refill  . abiraterone Acetate (ZYTIGA) 250 MG tablet Take 4 tablets (1,000 mg total) by mouth daily. Take on an empty stomach 1 hour before or 2 hours after a meal 120 tablet 4  . Investigational enzalutamide 40 MG capsule [facility] [id number] Take 4 capsules (160 mg total) by mouth daily. Take with or without food.Swallow whole. Do not crush, or open capsules. 120 capsule 0  . NUCYNTA 50 MG tablet Take 1 tablet (50 mg total) by mouth every 6 (six) hours as needed for moderate pain. 1 TO 2 TABS Q 6 HOURS PRN PAIN 30 tablet 0  . predniSONE (DELTASONE) 5 MG tablet Take 1 tablet (5 mg total) by mouth 2 (two) times daily. 60 tablet 0  . predniSONE (DELTASONE) 5 MG tablet   Take 1 tablet (5 mg total) by mouth 2 (two) times daily. 60 tablet 0  . tamsulosin (FLOMAX) 0.4 MG CAPS capsule Take 1 capsule (0.4 mg total) by mouth daily. 30 capsule 11  . Triptorelin Pamoate (TRELSTAR) 22.5 MG injection Inject 22.5 mg into the muscle every 6 (six) months.    . valACYclovir (VALTREX) 1000 MG tablet Take 1 tablet (1,000 mg total) by mouth 2 (two) times daily. (Patient taking differently: Take 1,000 mg by mouth 2 (two) times daily. ) 60 tablet 6  . venlafaxine XR (EFFEXOR-XR) 37.5 MG 24 hr capsule Take 1 capsule (37.5 mg total) by mouth daily with breakfast. 30 capsule 11   No  current facility-administered medications for this visit.     PHYSICAL EXAMINATION: ECOG PERFORMANCE STATUS: 0 - Asymptomatic  BP 125/71 (BP Location: Left Arm, Patient Position: Sitting)   Pulse 71   Temp (!) 96.8 F (36 C) (Tympanic)   Resp 16   Wt 165 lb 3.2 oz (74.9 kg)   BMI 22.41 kg/m   Filed Weights   08/23/17 1036 08/23/17 1043  Weight: 165 lb 3.2 oz (74.9 kg) 165 lb 3.2 oz (74.9 kg)    GENERAL: Well-nourished well-developed; Alert, no distress and comfortable. He is Alone. EYES: no pallor or icterus OROPHARYNX: no thrush or ulceration; good dentition  NECK: supple, no masses felt LYMPH:  no palpable lymphadenopathy in the cervical, axillary or inguinal regions LUNGS: clear to auscultation and  No wheeze or crackles HEART/CVS: regular rate & rhythm and no murmurs; No lower extremity edema ABDOMEN:abdomen soft, non-tender and normal bowel sounds Musculoskeletal:no cyanosis of digits and no clubbing;  PSYCH: alert & oriented x 3 with fluent speech NEURO: no focal motor/sensory deficits SKIN:  no rashes or significant lesions  LABORATORY DATA:  I have reviewed the data as listed    Component Value Date/Time   NA 137 08/22/2017 1135   NA 137 11/12/2014 1415   K 4.3 08/22/2017 1135   K 4.1 11/12/2014 1415   CL 101 08/22/2017 1135   CL 105 11/12/2014 1415   CO2 24 08/22/2017 1135   CO2 24 11/12/2014 1415   GLUCOSE 81 08/22/2017 1135   GLUCOSE 98 11/12/2014 1415   BUN 17 08/22/2017 1135   BUN 24 (H) 11/12/2014 1415   CREATININE 0.89 08/22/2017 1135   CREATININE 1.02 11/12/2014 1415   CALCIUM 8.9 08/22/2017 1135   CALCIUM 9.2 11/12/2014 1415   PROT 6.4 (L) 08/22/2017 1135   PROT 7.4 11/12/2014 1415   ALBUMIN 3.8 08/22/2017 1135   ALBUMIN 4.4 11/12/2014 1415   AST 17 08/22/2017 1135   AST 23 11/12/2014 1415   ALT 11 (L) 08/22/2017 1135   ALT 16 (L) 11/12/2014 1415   ALKPHOS 55 08/22/2017 1135   ALKPHOS 103 11/12/2014 1415   BILITOT 0.8 08/22/2017 1135    BILITOT 0.4 11/12/2014 1415   GFRNONAA >60 08/22/2017 1135   GFRNONAA >60 11/12/2014 1415   GFRAA >60 08/22/2017 1135   GFRAA >60 11/12/2014 1415    No results found for: SPEP, UPEP  Lab Results  Component Value Date   WBC 4.1 08/22/2017   NEUTROABS 2.2 08/22/2017   HGB 13.7 08/22/2017   HCT 40.1 08/22/2017   MCV 99.5 08/22/2017   PLT 190 08/22/2017      Chemistry      Component Value Date/Time   NA 137 08/22/2017 1135   NA 137 11/12/2014 1415   K 4.3 08/22/2017 1135   K 4.1 11/12/2014   1415   CL 101 08/22/2017 1135   CL 105 11/12/2014 1415   CO2 24 08/22/2017 1135   CO2 24 11/12/2014 1415   BUN 17 08/22/2017 1135   BUN 24 (H) 11/12/2014 1415   CREATININE 0.89 08/22/2017 1135   CREATININE 1.02 11/12/2014 1415      Component Value Date/Time   CALCIUM 8.9 08/22/2017 1135   CALCIUM 9.2 11/12/2014 1415   ALKPHOS 55 08/22/2017 1135   ALKPHOS 103 11/12/2014 1415   AST 17 08/22/2017 1135   AST 23 11/12/2014 1415   ALT 11 (L) 08/22/2017 1135   ALT 16 (L) 11/12/2014 1415   BILITOT 0.8 08/22/2017 1135   BILITOT 0.4 11/12/2014 1415              RADIOGRAPHIC STUDIES: I have personally reviewed the radiological images as listed and agreed with the findings in the report. No results found.  IMPRESSION: 1. Interval enlargement of what is now an 11 x 15 mm right lower lobe nodule, concerning for metastatic lesion. No other sites of new extra skeletal metastatic disease are identified on today's examination. 2. Similar appearance of widespread osseous metastatic disease compared to the prior examination. 3. Aortic atherosclerosis, in addition to left anterior descending coronary artery disease. Please note that although the presence of coronary artery calcium documents the presence of coronary artery disease, the severity of this disease and any potential stenosis cannot be assessed on this non-gated CT examination. Assessment for potential risk factor  modification, dietary therapy or pharmacologic therapy may be warranted, if clinically indicated. 4. There are calcifications of the aortic valve. Echocardiographic correlation for evaluation of potential valvular dysfunction may be warranted if clinically indicated. 5. 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 Attention on routine followup examinations is recommended.  Aortic Atherosclerosis (ICD10-I70.0). Aortic aneurysm NOS (ICD10-I71.9).   Electronically Signed   By: Daniel  Entrikin M.D.   On: 08/22/2017 14:35  Results for Sias, William L (MRN [id number]) as of 08/23/2017 10:48  Ref. Range 11/07/2012 09:17 11/14/2012 09:14 11/21/2012 09:05 11/28/2012 09:00 10/07/2014 10:31 11/12/2014 14:15 11/25/2014 10:16 12/09/2014 11:25 12/23/2014 14:01 01/06/2015 15:09 01/20/2015 13:47 02/03/2015 11:24 02/17/2015 10:36 03/16/2015 09:38 03/16/2015 11:00 04/14/2015 11:30 05/12/2015 11:01 06/09/2015 10:23 07/07/2015 11:16 08/04/2015 09:31 09/01/2015 09:50 09/29/2015 08:33 10/27/2015 10:49 11/24/2015 10:25 12/22/2015 09:20 01/19/2016 10:20 02/16/2016 10:33 03/16/2016 10:22 04/12/2016 09:20 05/10/2016 08:46 06/07/2016 09:51 07/05/2016 09:27 08/02/2016 10:25 08/30/2016 10:00 09/27/2016 09:45 10/25/2016 09:04 11/23/2016 09:28 12/20/2016 13:29 01/18/2017 10:23 02/14/2017 09:27 03/14/2017 09:54 04/11/2017 09:28 04/11/2017 09:35 05/09/2017 10:45 06/06/2017 10:14 06/06/2017 10:15 07/04/2017 09:44 08/01/2017 09:18 08/01/2017 09:25 08/22/2017 11:15 08/22/2017 11:35  PSA Latest Ref Range: 0.00 - 4.00 ng/mL      59.6 (H)   22.91 (H)  19.93 (H)  21.38 (H) 37.45 (H)  20.91 (H) 22.38 (H) 19.42 (H) 21.73 (H) 22.36 (H) 18.52 (H) 17.28 (H) 17.00 (H) 15.69 (H) 15.06 (H) 17.52 (H) 14.96 (H) 13.12 (H) 16.38 (H) 17.38 (H) 13.84 (H) 14.22 (H) 15.38 (H) 12.22 (H) 12.23 (H) 9.49 (H) 9.29 (H)                Prostatic Specific Antigen Latest Ref Range: 0.00 - 4.00 ng/mL                                      13.26 (H) 10.46 (H) 12.49 (H) 9.66 (H)  9.75 (H)  9.74 (H)   10.42 (H)  12.09 (H) 12.87 (H)  11.65 (H)     ASSESSMENT & PLAN:  Prostate cancer metastatic to multiple sites (HCC) 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 Also on Zytiga plus Xtandi- as per clinical trial.   # Feb 2019- CT scan C/A/P- Stable  lymphadenopathy in the pelvis and retroperitoneum; EXCEPT RLL- increasing/ nodule ~15mm. [ Met et vs primary  malignancy-less likely-see discussion below];  bone scan-stable. PSA- feb 2019- 11.65  #With regards to prostate cancer continue-Zytiga plus Xtandi at this time; based on the clinical trial guidelines-patient will likely come off 1 of the therapy/based on patient preference/ insurance will discuss further at next visit.  #Also discussed the patient regarding use of chemotherapy-docetaxel.  Patient reluctant.   #Based on CT scan probable progression- based on RLL ~10mm;  will discuss at tumor conefernce re: RLL; we will plan no current changes to treatment.  # Hot flashes- on ditropan; hot falshes better  # Bone lesions continue Xgeva; no side effects noted. Continue calcium and vitamin D.   # in 2 weeks/labs/x-geva; trelstar.   Addendum: Patient imaging/case reviewed at the tumor conference-right lower lobe lesion question primary lung versus metastasis discussed.  Recommend CT-guided biopsy.  Will discuss with the patient after he comes from his cruise.  # I reviewed the blood work- with the patient in detail; also reviewed the imaging independently [as summarized above]; and with the patient in detail.     No orders of the defined types were placed in this encounter.  All questions were answered. The patient knows to call the clinic with any problems, questions or concerns.      Govinda R Brahmanday, MD 08/30/2017 8:28 PM 

## 2017-08-23 NOTE — Assessment & Plan Note (Addendum)
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 Also on Zytiga plus Xtandi- as per clinical trial.   # Feb 2019- CT scan C/A/P- Stable  lymphadenopathy in the pelvis and retroperitoneum; EXCEPT RLL- increasing/ nodule ~1m. [ Met et vs primary  malignancy-less likely-see discussion below];  bone scan-stable. PSA- feb 2019- 11.65  #With regards to prostate cancer continue-Zytiga plus Xtandi at this time; based on the clinical trial guidelines-patient will likely come off 1 of the therapy/based on patient preference/ insurance will discuss further at next visit.  #Also discussed the patient regarding use of chemotherapy-docetaxel.  Patient reluctant.   #Based on CT scan probable progression- based on RLL ~110m  will discuss at tumor conefernce re: RLL; we will plan no current changes to treatment.  # Hot flashes- on ditropan; hot falshes better  # Bone lesions continue Xgeva; no side effects noted. Continue calcium and vitamin D.   # in 2 weeks/labs/x-geva; trelstar.   Addendum: Patient imaging/case reviewed at the tumor conference-right lower lobe lesion question primary lung versus metastasis discussed.  Recommend CT-guided biopsy.  Will discuss with the patient after he comes from his cruise.  # I reviewed the blood work- with the patient in detail; also reviewed the imaging independently [as summarized above]; and with the patient in detail.

## 2017-08-29 ENCOUNTER — Other Ambulatory Visit: Payer: Self-pay | Admitting: Internal Medicine

## 2017-08-30 ENCOUNTER — Telehealth: Payer: Self-pay | Admitting: Internal Medicine

## 2017-08-30 DIAGNOSIS — R911 Solitary pulmonary nodule: Secondary | ICD-10-CM

## 2017-08-30 NOTE — Telephone Encounter (Signed)
Discussed at tumor conference; recommend CT guided lung Bx.   Pt on cruise- will inform when pt comes back to next visit. Discussed with kay shoffner.

## 2017-08-31 NOTE — Telephone Encounter (Signed)
Faxed ct guided biopsy worksheet to spec. Scheduling. md prefers that the scan be schedule last week of Feb.

## 2017-09-03 ENCOUNTER — Telehealth: Payer: Self-pay | Admitting: *Deleted

## 2017-09-03 NOTE — Telephone Encounter (Signed)
T/C made to Mr. Sites at Dr. Aletha Halim request to inform him that Dr. [name] had discussed his case at Tumor conference last week and the recommendation is that we get a CT guided biopsy of the new lung nodule in order to determine whether it is metastatic cancer from the prostate or if this is a new lung cancer primary. Mr. Vinje has questions about the lung biopsy and whether it will be painful. Informed that I know they will use a local anesthetic, but I cannot say whether it is painful. Informed patient that I will research his question and call him back. [name], BSN, MHA, OCN 09/03/2017 11:49 AM

## 2017-09-04 NOTE — Telephone Encounter (Signed)
T/C made to William Wright to answer his questions from yesterday after consulting with Thoracic Navigator Burgess Estelle, RN. Patient informed that he will receive a light anesthetic, but that he will be awake enough to follow commands regarding holding his breath, etc. Also informed patient that most people report pressure rather than pain from the procedure, but some do report that it is painful. He questions how long the procedure will take and was informed that he will need to discuss that with the physician performing the biopsy. He also questions whether it is a same day procedure and was informed that it is. Patient to return to clinic on Thursday morning at 9:30am. William Wright instructed to return all of his study drug and medication diaries at that time, and Central study labs will be collected for end of active treatment visit. William Wright, BSN, MHA, OCN 09/04/2017 10:36 AM

## 2017-09-05 ENCOUNTER — Other Ambulatory Visit: Payer: Self-pay | Admitting: *Deleted

## 2017-09-05 DIAGNOSIS — C61 Malignant neoplasm of prostate: Secondary | ICD-10-CM

## 2017-09-06 ENCOUNTER — Encounter: Payer: Self-pay | Admitting: Internal Medicine

## 2017-09-06 ENCOUNTER — Inpatient Hospital Stay: Payer: Medicare Other

## 2017-09-06 ENCOUNTER — Inpatient Hospital Stay (HOSPITAL_BASED_OUTPATIENT_CLINIC_OR_DEPARTMENT_OTHER): Payer: Medicare Other | Admitting: Internal Medicine

## 2017-09-06 ENCOUNTER — Encounter: Payer: Self-pay | Admitting: *Deleted

## 2017-09-06 DIAGNOSIS — C7951 Secondary malignant neoplasm of bone: Secondary | ICD-10-CM | POA: Diagnosis not present

## 2017-09-06 DIAGNOSIS — C61 Malignant neoplasm of prostate: Secondary | ICD-10-CM

## 2017-09-06 DIAGNOSIS — Z5111 Encounter for antineoplastic chemotherapy: Secondary | ICD-10-CM | POA: Diagnosis not present

## 2017-09-06 DIAGNOSIS — Z79899 Other long term (current) drug therapy: Secondary | ICD-10-CM

## 2017-09-06 DIAGNOSIS — R911 Solitary pulmonary nodule: Secondary | ICD-10-CM | POA: Diagnosis not present

## 2017-09-06 DIAGNOSIS — Z006 Encounter for examination for normal comparison and control in clinical research program: Secondary | ICD-10-CM

## 2017-09-06 DIAGNOSIS — N5089 Other specified disorders of the male genital organs: Secondary | ICD-10-CM

## 2017-09-06 DIAGNOSIS — R591 Generalized enlarged lymph nodes: Secondary | ICD-10-CM

## 2017-09-06 LAB — BASIC METABOLIC PANEL
Anion gap: 7 (ref 5–15)
BUN: 18 mg/dL (ref 6–20)
CALCIUM: 8.8 mg/dL — AB (ref 8.9–10.3)
CO2: 27 mmol/L (ref 22–32)
Chloride: 105 mmol/L (ref 101–111)
Creatinine, Ser: 0.95 mg/dL (ref 0.61–1.24)
GFR calc Af Amer: >60 mL/min (ref >60)
GLUCOSE: 106 mg/dL — AB (ref 65–99)
POTASSIUM: 3.6 mmol/L (ref 3.5–5.1)
SODIUM: 139 mmol/L (ref 135–145)

## 2017-09-06 LAB — PROTIME-INR
INR: 0.96
Prothrombin Time: 12.7 seconds (ref 11.4–15.2)

## 2017-09-06 LAB — APTT: APTT: 28 s (ref 24–36)

## 2017-09-06 MED ORDER — DENOSUMAB 120 MG/1.7ML ~~LOC~~ SOLN
120.0000 mg | Freq: Once | SUBCUTANEOUS | Status: AC
  Administered 2017-09-06: 120 mg via SUBCUTANEOUS
  Filled 2017-09-06: qty 1.7

## 2017-09-06 NOTE — Assessment & Plan Note (Addendum)
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 Also on Zytiga plus Xtandi- as per clinical trial.   # Feb 2019- CT scan C/A/P- Stable  lymphadenopathy in the pelvis and retroperitoneum; EXCEPT RLL- increasing/ nodule ~61m. [ Met et vs primary  malignancy-less likely-see discussion below];  bone scan-stable. PSA- feb 2019- 11.65  # With regards to prostate cancer continue-Zytiga plus Xtandi- for now; given the futility noted on the alliance trial on interim analysis-patient will be on 1 of the either pills.  Patient will likely go on Zytiga plus prednisone-as part of standard therapy.  # Based on CT scan probable progression- based on RLL ~131mdifferential includes primary lung versus metastatic prostate cancer.  Recommend CT-guided biopsy.  Discussed the tumor conference.  Discussed options of treatment would be depend upon biopsy results.  #I had a long discussion with the patient regarding in general incurable disease; and also the natural history of the disease.  The median life expectancy of castrate resistant prostate cancer is about 1-2 years.  # Hot flashes- on ditropan; hot falshes better  # Bone lesions continue Xgeva; no side effects noted. Continue calcium and vitamin D.   # Genetic counseling -no significant family history except for father with prostate cancer in [id number] re: genetic counselling; again discuss at next visit.  Also discussed regarding molecular testing-will send out for omniseq on the tumor sample.   #Follow-up after few days of post biopsy.

## 2017-09-06 NOTE — Progress Notes (Signed)
William Wright returns to clinic today for end-of-treatment visit on the Alliance [id number] Prostate study. He has received daily Enzalutamide, Abiraterone and Prednisone since 11/19/2014. VS remain stable. B/P is 123/78 this morning. Patient continues to report fatigue as usual; however hot flashes are much better now since he began taking Ditropan. His hot flashes have not resolved completely, but remain significantly improved per patient report. Patient returned his completed medication calendars, PK form and one Enzalutamide pill bottle containing 88 capsules. Med calendars and pill count verifies that he is taking the enzalutamide, abiraterone and prednisone correctly. Patient should have another bottle of Enzalutamide with 8 capsules remaining; however he states the bottle was empty and he did not return it this visit. Dr. [name] in to discuss with patient the recommendations from Tumor conference last week and determine whether William Wright wants to proceed with biopsy of the new lung lesion at this time. Per tumor conference discussion, there is relative certainty that this is either a metastatic prostate cancer or a new primary lung cancer. Dr. [name] has explained all of this to the patient including the importance of knowing exactly what the new nodule is in order to treat his disease appropriately. William Wright has decided to go ahead with the biopsy and it has been scheduled on 09/13/17 at 10:30 in the morning. Patient aware that he needs to arrive at 9:30. All of patients questions were answered except for how long the biopsy will take, and this can only be approximated. Discussed with patient that he will no longer be on the research study and will no longer be able to take Enzalutamide supplied by the study. Explained to him that he will still be followed by the study, but that his labs, scans, visits and treatment will be prescribed per Dr. [name] and not according to the study  calendar. Per Dr. [name], he will continue to take Abiraterone 250mg  capsules x4 every evening, and he already receives #120 monthly from [facility] pharmacy. He will also continue to take Prednisone 5mg  twice daily and he receives this prescription through a different mail order pharmacy(through Medicare) in three month supplies. He will receive an Xgeva injection today as scheduled, and return to the clinic on 09/21/17 to get the results of his lung nodule biopsy and determine at that time how his treatment will proceed. EOT study labs were collected this morning. Adverse events and attributions were assessed as follows:  Adverse Event Log  Study/Protocol: Alliance [id number] Cycle: 37 adverse events  Event Grade Onset Date Resolved Date Drug Name Enzalutamide + Abiraterone Attribution Treatment Comments  Fatigue Grade 1 12/20/16 continuing  Probably  Patient reports it is same  Hot Flashes Grade 1 09/27/16 continuing  Possible  Much improved with Ditropan  Lipoma Grade 1 01/14/17 continuing  Unrelated None Causes no problems  Ringing in Head Grade 1  (mild) 07/02/17 continuing  Unrelated None "odd ringing in frontal area of head" per pt  [name], BSN, MHA, OCN 09/06/2017  11:54 AM .

## 2017-09-06 NOTE — Progress Notes (Signed)
[place] OFFICE PROGRESS NOTE  Patient Care Team: System, Provider Not In as PCP - General Royston Cowper, MD (Urology)  Cancer of prostate [facility])   Staging form: Prostate, AJCC 7th Edition     Clinical: Stage IV (T2, M1) - Signed by Evlyn Kanner, NP on 01/07/2015    Oncology History   # FEB 2014- Prostate cancer Stage II [T2N0]; PA-18; [Dr.Wolfe]  # May 2015-STAGE IV;  PSA 90 on ADT; CT/Bone scan-extensive mets;  casodex+ Trelstar  # June 2016- Castration resistant prostate cancer/Alliance protocol- ZYTIGA plus minus XTANDI; June 28th-CT-C/A/P- Stable Retrocrural/RP/Pelvic LN;Bone scan- Stable sclerotic lesions   # Xgeva     Cancer of prostate ([place])   08/17/2012 Initial Diagnosis    Cancer of prostate, stage II      12/03/2013 Progression         10/13/2014 Progression          Prostate cancer metastatic to multiple sites ([place])   01/19/2016 Initial Diagnosis    Prostate cancer metastatic to multiple sites [facility])        INTERVAL HISTORY:  William Wright 67 y.o.  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  Patient has just returned from a cruise.  Trip was uneventful.  Patient denies any shortness of breath or cough.  His hot flashes have significantly improved since starting Ditropan.  Patient denies any unusual weight loss or nausea vomiting or diarrhea. He denies any jaw pain. Denies any headaches or seizure-like activity.   REVIEW OF SYSTEMS:  A complete 10 point review of system is done which is negative except mentioned above/history of present illness.   PAST MEDICAL HISTORY :  Past Medical History:  Diagnosis Date  . Cancer of prostate ([place]) 07/2012  . Prostate cancer ([place])     PAST SURGICAL HISTORY :   Past Surgical History:  Procedure Laterality Date  . EXTRACORPOREAL SHOCK WAVE LITHOTRIPSY Left 05/20/2015    Procedure: EXTRACORPOREAL SHOCK WAVE LITHOTRIPSY (ESWL);  Surgeon: Royston Cowper, MD;  Location: [facility] ORS;  Service: Urology;  Laterality: Left;  . EXTRACORPOREAL SHOCK WAVE LITHOTRIPSY Right 12/21/2016   Procedure: EXTRACORPOREAL SHOCK WAVE LITHOTRIPSY (ESWL);  Surgeon: Royston Cowper, MD;  Location: [facility] ORS;  Service: Urology;  Laterality: Right;     FAMILY HISTORY :  History reviewed. No pertinent family history. dad prostate cancer- in 60s; mom's dad- lung/smoker; girl/ boy- 59; 24 years  SOCIAL HISTORY:   Social History   Tobacco Use  . Smoking status: Never Smoker  . Smokeless tobacco: Never Used  Substance Use Topics  . Alcohol use: No  . Drug use: No    ALLERGIES:  is allergic to no known allergies.  MEDICATIONS:  Current Outpatient Medications  Medication Sig Dispense Refill  . abiraterone Acetate (ZYTIGA) 250 MG tablet Take 4 tablets (1,000 mg total) by mouth daily. Take on an empty stomach 1 hour before or 2 hours after a meal 120 tablet 4  . Investigational enzalutamide 40 MG capsule ALLIANCE [id number] Take 4 capsules (160 mg total) by mouth daily. Take with or without food.Swallow whole. Do not crush, or open capsules. 120 capsule 0  . NUCYNTA 50 MG tablet Take 1 tablet (50 mg total) by mouth every 6 (six) hours as needed for moderate pain. 1 TO 2 TABS Q 6 HOURS PRN PAIN 30 tablet 0  . predniSONE (DELTASONE) 5 MG  tablet Take 1 tablet (5 mg total) by mouth 2 (two) times daily. 60 tablet 0  . tamsulosin (FLOMAX) 0.4 MG CAPS capsule Take 1 capsule (0.4 mg total) by mouth daily. 30 capsule 11  . Triptorelin Pamoate (TRELSTAR) 22.5 MG injection Inject 22.5 mg into the muscle every 6 (six) months.    . valACYclovir (VALTREX) 1000 MG tablet Take 1 tablet (1,000 mg total) by mouth 2 (two) times daily. (Patient taking differently: Take 1,000 mg by mouth 2 (two) times daily. ) 60 tablet 6  . venlafaxine XR (EFFEXOR-XR) 37.5 MG 24 hr capsule Take 1 capsule (37.5 mg total) by mouth  daily with breakfast. 30 capsule 11   No current facility-administered medications for this visit.     PHYSICAL EXAMINATION: ECOG PERFORMANCE STATUS: 0 - Asymptomatic  BP 123/78 (BP Location: Left Arm, Patient Position: Sitting)   Pulse 93   Temp 97.8 F (36.6 C) (Tympanic)   Resp 16   Wt 165 lb 8 oz (75.1 kg)   BMI 22.45 kg/m   Filed Weights   09/06/17 1010  Weight: 165 lb 8 oz (75.1 kg)    GENERAL: Well-nourished well-developed; Alert, no distress and comfortable. He is Alone. EYES: no pallor or icterus OROPHARYNX: no thrush or ulceration; good dentition  NECK: supple, no masses felt LYMPH:  no palpable lymphadenopathy in the cervical, axillary or inguinal regions LUNGS: clear to auscultation and  No wheeze or crackles HEART/CVS: regular rate & rhythm and no murmurs; No lower extremity edema ABDOMEN:abdomen soft, non-tender and normal bowel sounds Musculoskeletal:no cyanosis of digits and no clubbing;  PSYCH: alert & oriented x 3 with fluent speech NEURO: no focal motor/sensory deficits SKIN:  no rashes or significant lesions  LABORATORY DATA:  I have reviewed the data as listed    Component Value Date/Time   NA 139 09/06/2017 0940   NA 137 11/12/2014 1415   K 3.6 09/06/2017 0940   K 4.1 11/12/2014 1415   CL 105 09/06/2017 0940   CL 105 11/12/2014 1415   CO2 27 09/06/2017 0940   CO2 24 11/12/2014 1415   GLUCOSE 106 (H) 09/06/2017 0940   GLUCOSE 98 11/12/2014 1415   BUN 18 09/06/2017 0940   BUN 24 (H) 11/12/2014 1415   CREATININE 0.95 09/06/2017 0940   CREATININE 1.02 11/12/2014 1415   CALCIUM 8.8 (L) 09/06/2017 0940   CALCIUM 9.2 11/12/2014 1415   PROT 6.4 (L) 08/22/2017 1135   PROT 7.4 11/12/2014 1415   ALBUMIN 3.8 08/22/2017 1135   ALBUMIN 4.4 11/12/2014 1415   AST 17 08/22/2017 1135   AST 23 11/12/2014 1415   ALT 11 (L) 08/22/2017 1135   ALT 16 (L) 11/12/2014 1415   ALKPHOS 55 08/22/2017 1135   ALKPHOS 103 11/12/2014 1415   BILITOT 0.8 08/22/2017  1135   BILITOT 0.4 11/12/2014 1415   GFRNONAA >60 09/06/2017 0940   GFRNONAA >60 11/12/2014 1415   GFRAA >60 09/06/2017 0940   GFRAA >60 11/12/2014 1415    No results found for: SPEP, UPEP  Lab Results  Component Value Date   WBC 4.1 08/22/2017   NEUTROABS 2.2 08/22/2017   HGB 13.7 08/22/2017   HCT 40.1 08/22/2017   MCV 99.5 08/22/2017   PLT 190 08/22/2017      Chemistry      Component Value Date/Time   NA 139 09/06/2017 0940   NA 137 11/12/2014 1415   K 3.6 09/06/2017 0940   K 4.1 11/12/2014 1415   CL 105 09/06/2017  0940   CL 105 11/12/2014 1415   CO2 27 09/06/2017 0940   CO2 24 11/12/2014 1415   BUN 18 09/06/2017 0940   BUN 24 (H) 11/12/2014 1415   CREATININE 0.95 09/06/2017 0940   CREATININE 1.02 11/12/2014 1415      Component Value Date/Time   CALCIUM 8.8 (L) 09/06/2017 0940   CALCIUM 9.2 11/12/2014 1415   ALKPHOS 55 08/22/2017 1135   ALKPHOS 103 11/12/2014 1415   AST 17 08/22/2017 1135   AST 23 11/12/2014 1415   ALT 11 (L) 08/22/2017 1135   ALT 16 (L) 11/12/2014 1415   BILITOT 0.8 08/22/2017 1135   BILITOT 0.4 11/12/2014 1415              RADIOGRAPHIC STUDIES: I have personally reviewed the radiological images as listed and agreed with the findings in the report. No results found.  IMPRESSION: 1. Interval enlargement of what is now an 11 x 15 mm right lower lobe nodule, concerning for metastatic lesion. No other sites of new extra skeletal metastatic disease are identified on today's examination. 2. Similar appearance of widespread osseous metastatic disease compared to the prior examination. 3. Aortic atherosclerosis, in addition to left anterior descending coronary artery disease. Please note that although the presence of coronary artery calcium documents the presence of coronary artery disease, the severity of this disease and any potential stenosis cannot be assessed on this non-gated CT examination. Assessment for potential risk  factor modification, dietary therapy or pharmacologic therapy may be warranted, if clinically indicated. 4. There are calcifications of the aortic valve. Echocardiographic correlation for evaluation of potential valvular dysfunction may be warranted if clinically indicated. 5. 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 Attention on routine followup examinations is recommended.  Aortic Atherosclerosis (ICD10-I70.0). Aortic aneurysm NOS (ICD10-I71.9).   Electronically Signed   By: Vinnie Langton M.D.   On: 08/22/2017 14:35  Results for William Wright, William Wright (MRN [id number]) as of 08/23/2017 10:48  Ref. Range 11/07/2012 09:17 11/14/2012 09:14 11/21/2012 09:05 11/28/2012 09:00 10/07/2014 10:31 11/12/2014 14:15 11/25/2014 10:16 12/09/2014 11:25 12/23/2014 14:01 01/06/2015 15:09 01/20/2015 13:47 02/03/2015 11:24 02/17/2015 10:36 03/16/2015 09:38 03/16/2015 11:00 04/14/2015 11:30 05/12/2015 11:01 06/09/2015 10:23 07/07/2015 11:16 08/04/2015 09:31 09/01/2015 09:50 09/29/2015 08:33 10/27/2015 10:49 11/24/2015 10:25 12/22/2015 09:20 01/19/2016 10:20 02/16/2016 10:33 03/16/2016 10:22 04/12/2016 09:20 05/10/2016 08:46 06/07/2016 09:51 07/05/2016 09:27 08/02/2016 10:25 08/30/2016 10:00 09/27/2016 09:45 10/25/2016 09:04 11/23/2016 09:28 12/20/2016 13:29 01/18/2017 10:23 02/14/2017 09:27 03/14/2017 09:54 04/11/2017 09:28 04/11/2017 09:35 05/09/2017 10:45 06/06/2017 10:14 06/06/2017 10:15 07/04/2017 09:44 08/01/2017 09:18 08/01/2017 09:25 08/22/2017 11:15 08/22/2017 11:35  PSA Latest Ref Range: 0.00 - 4.00 ng/mL      59.6 (H)   22.91 (H)  19.93 (H)  21.38 (H) 37.45 (H)  20.91 (H) 22.38 (H) 19.42 (H) 21.73 (H) 22.36 (H) 18.52 (H) 17.28 (H) 17.00 (H) 15.69 (H) 15.06 (H) 17.52 (H) 14.96 (H) 13.12 (H) 16.38 (H) 17.38 (H) 13.84 (H) 14.22 (H) 15.38 (H) 12.22 (H) 12.23 (H) 9.49 (H) 9.29 (H)                Prostatic Specific Antigen Latest Ref Range: 0.00 - 4.00 ng/mL                                      13.26 (H) 10.46 (H) 12.49 (H)  9.66 (H) 9.75 (H)  9.74 (H) 10.42 (H)  12.09 (H)  12.87 (H)  11.65 (H)     ASSESSMENT & PLAN:  Prostate cancer metastatic to multiple sites [facility]) 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 Also on Zytiga plus Xtandi- as per clinical trial.   # Feb 2019- CT scan C/A/P- Stable  lymphadenopathy in the pelvis and retroperitoneum; EXCEPT RLL- increasing/ nodule ~43m. [ Met et vs primary  malignancy-less likely-see discussion below];  bone scan-stable. PSA- feb 2019- 11.65  # With regards to prostate cancer continue-Zytiga plus Xtandi- for now; given the futility noted on the alliance trial on interim analysis-patient will be on 1 of the either pills.  Patient will likely go on Zytiga plus prednisone-as part of standard therapy.  # Based on CT scan probable progression- based on RLL ~144mdifferential includes primary lung versus metastatic prostate cancer.  Recommend CT-guided biopsy.  Discussed the tumor conference.  Discussed options of treatment would be depend upon biopsy results.  #I had a long discussion with the patient regarding in general incurable disease; and also the natural history of the disease.  The median life expectancy of castrate resistant prostate cancer is about 1-2 years.  # Hot flashes- on ditropan; hot falshes better  # Bone lesions continue Xgeva; no side effects noted. Continue calcium and vitamin D.   # Genetic counseling -no significant family history except for father with prostate cancer in [id number] re: genetic counselling; again discuss at next visit.  Also discussed regarding molecular testing-will send out for omniseq on the tumor sample.   #Follow-up after few days of post biopsy.   No orders of the defined types were placed in this encounter.  All questions were answered. The patient knows to call the clinic with any problems, questions or concerns.      GoCammie Sickle MD 09/07/2017 7:20 AM

## 2017-09-07 ENCOUNTER — Telehealth: Payer: Self-pay | Admitting: Internal Medicine

## 2017-09-07 NOTE — Telephone Encounter (Signed)
The path was from 2014. I faxed the request form at 1630 today to attn Dr. Reuel Derby

## 2017-09-07 NOTE — Telephone Encounter (Signed)
Patient's tumor needs to be sent for [facility] testing; prostate biopsy was done with [facility] in ? 2015.

## 2017-09-12 ENCOUNTER — Other Ambulatory Visit: Payer: Self-pay | Admitting: Radiology

## 2017-09-13 ENCOUNTER — Ambulatory Visit
Admission: RE | Admit: 2017-09-13 | Discharge: 2017-09-13 | Disposition: A | Discharge status: Home / Self Care | Payer: Medicare Other | Source: Ambulatory Visit | Attending: Internal Medicine | Admitting: Internal Medicine

## 2017-09-13 ENCOUNTER — Ambulatory Visit
Admission: RE | Admit: 2017-09-13 | Discharge: 2017-09-13 | Disposition: A | Discharge status: Home / Self Care | Payer: Medicare Other | Source: Ambulatory Visit | Attending: Interventional Radiology | Admitting: Interventional Radiology

## 2017-09-13 DIAGNOSIS — R911 Solitary pulmonary nodule: Secondary | ICD-10-CM | POA: Insufficient documentation

## 2017-09-13 DIAGNOSIS — Z9889 Other specified postprocedural states: Secondary | ICD-10-CM | POA: Insufficient documentation

## 2017-09-13 DIAGNOSIS — J984 Other disorders of lung: Secondary | ICD-10-CM | POA: Diagnosis not present

## 2017-09-13 DIAGNOSIS — B450 Pulmonary cryptococcosis: Secondary | ICD-10-CM | POA: Diagnosis not present

## 2017-09-13 LAB — CBC
HCT: 40.7 % (ref 40.0–52.0)
Hemoglobin: 13.7 g/dL (ref 13.0–18.0)
MCH: 33 pg (ref 26.0–34.0)
MCHC: 33.6 g/dL (ref 32.0–36.0)
MCV: 98.3 fL (ref 80.0–100.0)
PLATELETS: 201 10*3/uL (ref 150–440)
RBC: 4.14 MIL/uL — AB (ref 4.40–5.90)
RDW: 13.3 % (ref 11.5–14.5)
WBC: 3.7 10*3/uL — ABNORMAL LOW (ref 3.8–10.6)

## 2017-09-13 LAB — PROTIME-INR
INR: 0.94
PROTHROMBIN TIME: 12.5 s (ref 11.4–15.2)

## 2017-09-13 MED ORDER — MIDAZOLAM HCL 5 MG/5ML IJ SOLN
INTRAMUSCULAR | Status: AC
  Filled 2017-09-13: qty 5

## 2017-09-13 MED ORDER — SODIUM CHLORIDE 0.9 % IV SOLN
INTRAVENOUS | Status: DC
  Administered 2017-09-13: 10:00:00 via INTRAVENOUS

## 2017-09-13 MED ORDER — FENTANYL CITRATE (PF) 100 MCG/2ML IJ SOLN
INTRAMUSCULAR | Status: AC | PRN
  Administered 2017-09-13: 50 ug via INTRAVENOUS
  Administered 2017-09-13: 25 ug via INTRAVENOUS

## 2017-09-13 MED ORDER — LIDOCAINE HCL (PF) 1 % IJ SOLN
INTRAMUSCULAR | Status: AC | PRN
  Administered 2017-09-13: 10 mL

## 2017-09-13 MED ORDER — MIDAZOLAM HCL 5 MG/5ML IJ SOLN
INTRAMUSCULAR | Status: AC | PRN
  Administered 2017-09-13: 1 mg via INTRAVENOUS
  Administered 2017-09-13 (×3): 0.5 mg via INTRAVENOUS
  Administered 2017-09-13: 1 mg via INTRAVENOUS

## 2017-09-13 MED ORDER — FENTANYL CITRATE (PF) 100 MCG/2ML IJ SOLN
INTRAMUSCULAR | Status: AC
  Filled 2017-09-13: qty 4

## 2017-09-13 MED ORDER — HYDROCODONE-ACETAMINOPHEN 5-325 MG PO TABS: 1.0000 | ORAL_TABLET | ORAL | Status: DC | PRN

## 2017-09-13 NOTE — Procedures (Signed)
  Procedure: CT core RLL nodule 18g x3 to surg path EBL:   minimal Complications:  none immediate  See full dictation in [facility].  Dillard Cannon MD Main # [phone] Pager  [phone]

## 2017-09-18 ENCOUNTER — Telehealth: Payer: Self-pay | Admitting: *Deleted

## 2017-09-18 ENCOUNTER — Telehealth: Payer: Self-pay | Admitting: Internal Medicine

## 2017-09-18 ENCOUNTER — Other Ambulatory Visit: Payer: Self-pay | Admitting: Pathology

## 2017-09-18 DIAGNOSIS — C61 Malignant neoplasm of prostate: Secondary | ICD-10-CM | POA: Diagnosis not present

## 2017-09-18 LAB — SURGICAL PATHOLOGY

## 2017-09-18 NOTE — Telephone Encounter (Signed)
T/C made to William Wright after speaking to Dr. [name] about his pathology report. 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

## 2017-09-18 NOTE — Telephone Encounter (Signed)
Patient was instructed that Dr. [name] will consult with the infectious disease MD for further direction on how to treat his biopsy proven fungal lung infection, and will contact the patient later today with more information. Patient voices relief and thankfulness that the biopsy was not cancer. [name], BSN, MHA, OCN 09/18/2017 11:20 AM

## 2017-09-18 NOTE — Telephone Encounter (Signed)
Reviewed path- with pt; neg for malignany; positive for fungal infection. Pt agreeable to be refered to Dr.Fiztgerald.   Please make a referral to Dr.Fitzgerald ASAP.

## 2017-09-19 ENCOUNTER — Other Ambulatory Visit: Payer: Self-pay | Admitting: *Deleted

## 2017-09-19 ENCOUNTER — Telehealth: Payer: Self-pay | Admitting: Internal Medicine

## 2017-09-19 DIAGNOSIS — B459 Cryptococcosis, unspecified: Secondary | ICD-10-CM

## 2017-09-19 NOTE — Telephone Encounter (Signed)
Spoke to Dr.Fitzgerald; kindly agrees to evaluate the pt; referral being faxed today.

## 2017-09-20 NOTE — Telephone Encounter (Signed)
Referral has been placed. 

## 2017-09-21 ENCOUNTER — Encounter: Payer: Self-pay | Admitting: Internal Medicine

## 2017-09-21 ENCOUNTER — Inpatient Hospital Stay: Payer: Medicare Other | Attending: Internal Medicine | Admitting: Internal Medicine

## 2017-09-21 VITALS — BP 120/71 | HR 57 | Temp 96.8°F | Ht 72.0 in | Wt 163.0 lb

## 2017-09-21 DIAGNOSIS — C778 Secondary and unspecified malignant neoplasm of lymph nodes of multiple regions: Secondary | ICD-10-CM | POA: Diagnosis not present

## 2017-09-21 DIAGNOSIS — I700 Atherosclerosis of aorta: Secondary | ICD-10-CM | POA: Diagnosis not present

## 2017-09-21 DIAGNOSIS — C7951 Secondary malignant neoplasm of bone: Secondary | ICD-10-CM | POA: Diagnosis not present

## 2017-09-21 DIAGNOSIS — R918 Other nonspecific abnormal finding of lung field: Secondary | ICD-10-CM | POA: Diagnosis not present

## 2017-09-21 DIAGNOSIS — Z79899 Other long term (current) drug therapy: Secondary | ICD-10-CM | POA: Diagnosis not present

## 2017-09-21 DIAGNOSIS — C61 Malignant neoplasm of prostate: Secondary | ICD-10-CM | POA: Insufficient documentation

## 2017-09-21 DIAGNOSIS — I2510 Atherosclerotic heart disease of native coronary artery without angina pectoris: Secondary | ICD-10-CM | POA: Insufficient documentation

## 2017-09-21 MED ORDER — ENZALUTAMIDE 40 MG PO CAPS: 160.0000 mg | ORAL_CAPSULE | Freq: Every day | ORAL | 6 refills | Status: DC

## 2017-09-21 NOTE — Progress Notes (Signed)
No new changes noted today 

## 2017-09-21 NOTE — Assessment & Plan Note (Addendum)
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 Also on Zytiga plus Xtandi- as per clinical trial.   # Feb 2019- CT scan C/A/P- Stable  lymphadenopathy in the pelvis and retroperitoneum; EXCEPT RLL- increasing/ nodule ~47mm. [s/p Bx- see discussion below;  bone scan-stable. PSA- feb 2019- 11.65  # With regards to prostate cancer continue-Zytiga plus Xtandi- for now; given the futility noted on the alliance trial on interim analysis-patient will be on 1 of the either pills.    #Given the diagnosis of pulmonary/fungal infection [right lower lobe-see discussion below]; my preference would be is to avoid prednisone; and hence proceed with Xtandi.  Prescription given.  Discussed with oral pharmacist.  #Right lower lobe~26mm-status post biopsy-positive for fungal infection.  Recommend ID evaluation.   # Hot flashes- on ditropan; hot falshes better  # Bone lesions continue Xgeva; no side effects noted. Continue calcium and vitamin D.   # Genetic counseling -no significant family history except for father with prostate cancer in 33s; discussed re: genetic counselling; again discuss at next visit.  Also discussed regarding molecular testing-will send out for omniseq on the tumor sample.

## 2017-09-21 NOTE — Progress Notes (Signed)
[place] OFFICE PROGRESS NOTE  Patient Care Team: System, Provider Not In as PCP - General Royston Cowper, MD (Urology)  Cancer of prostate [facility])   Staging form: Prostate, AJCC 7th Edition     Clinical: Stage IV (T2, M1) - Signed by Evlyn Kanner, NP on 01/07/2015    Oncology History   # FEB 2014- Prostate cancer Stage II [T2N0]; PA-18; [Dr.Wolfe]  # May 2015-STAGE IV;  PSA 90 on ADT; CT/Bone scan-extensive mets;  casodex+ Trelstar  # June 2016- Castration resistant prostate cancer/[facility] protocol- ZYTIGA plus minus XTANDI; June 28th-CT-C/A/P- Stable Retrocrural/RP/Pelvic LN;Bone scan- Stable sclerotic lesions  # MARCH 2019- Taken off trial [ based on interm analysis]; continue X-tandi [given not needing steroids]   # RLL [~85mm lung nodule] s/p Bx- Cryptococcus- ID/Dr.Fitzgerald.    # Xgeva     Cancer of prostate ([place])   08/17/2012 Initial Diagnosis    Cancer of prostate, stage II      12/03/2013 Progression         10/13/2014 Progression          Prostate cancer metastatic to multiple sites [facility])     INTERVAL HISTORY:  William Wright 67 y.o.  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  Patient denies any shortness of breath or cough.  His hot flashes have significantly improved since starting Ditropan.  Patient denies any unusual weight loss or nausea vomiting or diarrhea. He denies any jaw pain. Denies any headaches or seizure-like activity.   REVIEW OF SYSTEMS:  A complete 10 point review of system is done which is negative except mentioned above/history of present illness.   PAST MEDICAL HISTORY :  Past Medical History:  Diagnosis Date  . Cancer of prostate ([place]) 07/2012  . Prostate cancer ([place])     PAST SURGICAL HISTORY :   Past Surgical History:  Procedure  Laterality Date  . EXTRACORPOREAL SHOCK WAVE LITHOTRIPSY Left 05/20/2015   Procedure: EXTRACORPOREAL SHOCK WAVE LITHOTRIPSY (ESWL);  Surgeon: Royston Cowper, MD;  Location: [facility] ORS;  Service: Urology;  Laterality: Left;  . EXTRACORPOREAL SHOCK WAVE LITHOTRIPSY Right 12/21/2016   Procedure: EXTRACORPOREAL SHOCK WAVE LITHOTRIPSY (ESWL);  Surgeon: Royston Cowper, MD;  Location: [facility] ORS;  Service: Urology;  Laterality: Right;     FAMILY HISTORY :  History reviewed. No pertinent family history. dad prostate cancer- in 48s; mom's dad- lung/smoker; girl/ boy- 79; 44 years  SOCIAL HISTORY:   Social History   Tobacco Use  . Smoking status: Never Smoker  . Smokeless tobacco: Never Used  Substance Use Topics  . Alcohol use: No  . Drug use: No    ALLERGIES:  is allergic to no known allergies.  MEDICATIONS:  Current Outpatient Medications  Medication Sig Dispense Refill  . oxybutynin (DITROPAN) 5 MG tablet Take 5 mg by mouth daily.    . tamsulosin (FLOMAX) 0.4 MG CAPS capsule Take 1 capsule (0.4 mg total) by mouth daily. 30 capsule 11  . Triptorelin Pamoate (TRELSTAR) 22.5 MG injection Inject 22.5 mg into the muscle every 6 (six) months.    . enzalutamide (XTANDI) 40 MG capsule Take 4 capsules (160 mg total) by mouth daily. 120 capsule 6  . NUCYNTA 50 MG tablet Take 1 tablet (50 mg total) by mouth every 6 (six) hours as needed for moderate pain. 1  TO 2 TABS Q 6 HOURS PRN PAIN (Patient not taking: Reported on 09/21/2017) 30 tablet 0  . valACYclovir (VALTREX) 1000 MG tablet Take 1 tablet (1,000 mg total) by mouth 2 (two) times daily. (Patient not taking: Reported on 09/21/2017) 60 tablet 6  . venlafaxine XR (EFFEXOR-XR) 37.5 MG 24 hr capsule Take 1 capsule (37.5 mg total) by mouth daily with breakfast. (Patient not taking: Reported on 09/13/2017) 30 capsule 11   No current facility-administered medications for this visit.     PHYSICAL EXAMINATION: ECOG PERFORMANCE STATUS: 0 - Asymptomatic  BP  120/71 (BP Location: Left Arm, Patient Position: Sitting)   Pulse (!) 57   Temp (!) 96.8 F (36 C) (Tympanic)   Ht 6' (1.829 m)   Wt 163 lb (73.9 kg)   SpO2 99%   BMI 22.11 kg/m   Filed Weights   09/21/17 1224  Weight: 163 lb (73.9 kg)    GENERAL: Well-nourished well-developed; Alert, no distress and comfortable. He is Alone. EYES: no pallor or icterus OROPHARYNX: no thrush or ulceration; good dentition  NECK: supple, no masses felt LYMPH:  no palpable lymphadenopathy in the cervical, axillary or inguinal regions LUNGS: clear to auscultation and  No wheeze or crackles HEART/CVS: regular rate & rhythm and no murmurs; No lower extremity edema ABDOMEN:abdomen soft, non-tender and normal bowel sounds Musculoskeletal:no cyanosis of digits and no clubbing;  PSYCH: alert & oriented x 3 with fluent speech NEURO: no focal motor/sensory deficits SKIN:  no rashes or significant lesions  LABORATORY DATA:  I have reviewed the data as listed    Component Value Date/Time   NA 139 09/06/2017 0940   NA 137 11/12/2014 1415   K 3.6 09/06/2017 0940   K 4.1 11/12/2014 1415   CL 105 09/06/2017 0940   CL 105 11/12/2014 1415   CO2 27 09/06/2017 0940   CO2 24 11/12/2014 1415   GLUCOSE 106 (H) 09/06/2017 0940   GLUCOSE 98 11/12/2014 1415   BUN 18 09/06/2017 0940   BUN 24 (H) 11/12/2014 1415   CREATININE 0.95 09/06/2017 0940   CREATININE 1.02 11/12/2014 1415   CALCIUM 8.8 (L) 09/06/2017 0940   CALCIUM 9.2 11/12/2014 1415   PROT 6.4 (L) 08/22/2017 1135   PROT 7.4 11/12/2014 1415   ALBUMIN 3.8 08/22/2017 1135   ALBUMIN 4.4 11/12/2014 1415   AST 17 08/22/2017 1135   AST 23 11/12/2014 1415   ALT 11 (L) 08/22/2017 1135   ALT 16 (L) 11/12/2014 1415   ALKPHOS 55 08/22/2017 1135   ALKPHOS 103 11/12/2014 1415   BILITOT 0.8 08/22/2017 1135   BILITOT 0.4 11/12/2014 1415   GFRNONAA >60 09/06/2017 0940   GFRNONAA >60 11/12/2014 1415   GFRAA >60 09/06/2017 0940   GFRAA >60 11/12/2014 1415     No results found for: SPEP, UPEP  Lab Results  Component Value Date   WBC 3.7 (L) 09/13/2017   NEUTROABS 2.2 08/22/2017   HGB 13.7 09/13/2017   HCT 40.7 09/13/2017   MCV 98.3 09/13/2017   PLT 201 09/13/2017      Chemistry      Component Value Date/Time   NA 139 09/06/2017 0940   NA 137 11/12/2014 1415   K 3.6 09/06/2017 0940   K 4.1 11/12/2014 1415   CL 105 09/06/2017 0940   CL 105 11/12/2014 1415   CO2 27 09/06/2017 0940   CO2 24 11/12/2014 1415   BUN 18 09/06/2017 0940   BUN 24 (H) 11/12/2014 1415   CREATININE  0.95 09/06/2017 0940   CREATININE 1.02 11/12/2014 1415      Component Value Date/Time   CALCIUM 8.8 (L) 09/06/2017 0940   CALCIUM 9.2 11/12/2014 1415   ALKPHOS 55 08/22/2017 1135   ALKPHOS 103 11/12/2014 1415   AST 17 08/22/2017 1135   AST 23 11/12/2014 1415   ALT 11 (L) 08/22/2017 1135   ALT 16 (L) 11/12/2014 1415   BILITOT 0.8 08/22/2017 1135   BILITOT 0.4 11/12/2014 1415      RADIOGRAPHIC STUDIES: I have personally reviewed the radiological images as listed and agreed with the findings in the report. No results found.  IMPRESSION: 1. Interval enlargement of what is now an 11 x 15 mm right lower lobe nodule, concerning for metastatic lesion. No other sites of new extra skeletal metastatic disease are identified on today's examination. 2. Similar appearance of widespread osseous metastatic disease compared to the prior examination. 3. Aortic atherosclerosis, in addition to left anterior descending coronary artery disease. Please note that although the presence of coronary artery calcium documents the presence of coronary artery disease, the severity of this disease and any potential stenosis cannot be assessed on this non-gated CT examination. Assessment for potential risk factor modification, dietary therapy or pharmacologic therapy may be warranted, if clinically indicated. 4. There are calcifications of the aortic valve.  Echocardiographic correlation for evaluation of potential valvular dysfunction may be warranted if clinically indicated. 5. 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 Attention on routine followup examinations is recommended.  Aortic Atherosclerosis (ICD10-I70.0). Aortic aneurysm NOS (ICD10-I71.9).   Electronically Signed   By: Vinnie Langton M.D.   On: 08/22/2017 14:35  Results for FLOYDE, DINGLEY (MRN [id number]) as of 08/23/2017 10:48  Ref. Range 11/07/2012 09:17 11/14/2012 09:14 11/21/2012 09:05 11/28/2012 09:00 10/07/2014 10:31 11/12/2014 14:15 11/25/2014 10:16 12/09/2014 11:25 12/23/2014 14:01 01/06/2015 15:09 01/20/2015 13:47 02/03/2015 11:24 02/17/2015 10:36 03/16/2015 09:38 03/16/2015 11:00 04/14/2015 11:30 05/12/2015 11:01 06/09/2015 10:23 07/07/2015 11:16 08/04/2015 09:31 09/01/2015 09:50 09/29/2015 08:33 10/27/2015 10:49 11/24/2015 10:25 12/22/2015 09:20 01/19/2016 10:20 02/16/2016 10:33 03/16/2016 10:22 04/12/2016 09:20 05/10/2016 08:46 06/07/2016 09:51 07/05/2016 09:27 08/02/2016 10:25 08/30/2016 10:00 09/27/2016 09:45 10/25/2016 09:04 11/23/2016 09:28 12/20/2016 13:29 01/18/2017 10:23 02/14/2017 09:27 03/14/2017 09:54 04/11/2017 09:28 04/11/2017 09:35 05/09/2017 10:45 06/06/2017 10:14 06/06/2017 10:15 07/04/2017 09:44 08/01/2017 09:18 08/01/2017 09:25 08/22/2017 11:15 08/22/2017 11:35  PSA Latest Ref Range: 0.00 - 4.00 ng/mL      59.6 (H)   22.91 (H)  19.93 (H)  21.38 (H) 37.45 (H)  20.91 (H) 22.38 (H) 19.42 (H) 21.73 (H) 22.36 (H) 18.52 (H) 17.28 (H) 17.00 (H) 15.69 (H) 15.06 (H) 17.52 (H) 14.96 (H) 13.12 (H) 16.38 (H) 17.38 (H) 13.84 (H) 14.22 (H) 15.38 (H) 12.22 (H) 12.23 (H) 9.49 (H) 9.29 (H)                Prostatic Specific Antigen Latest Ref Range: 0.00 - 4.00 ng/mL                                      13.26 (H) 10.46 (H) 12.49 (H) 9.66 (H) 9.75 (H)  9.74 (H) 10.42 (H)  12.09 (H) 12.87 (H)  11.65 (H)     ASSESSMENT & PLAN:  Prostate cancer metastatic to multiple sites  [facility]) 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 Also on Zytiga plus Xtandi-  as per clinical trial.   # Feb 2019- CT scan C/A/P- Stable  lymphadenopathy in the pelvis and retroperitoneum; EXCEPT RLL- increasing/ nodule ~57mm. [s/p Bx- see discussion below;  bone scan-stable. PSA- feb 2019- 11.65  # With regards to prostate cancer continue-Zytiga plus Xtandi- for now; given the futility noted on the [facility] trial on interim analysis-patient will be on 1 of the either pills.    #Given the diagnosis of pulmonary/fungal infection [right lower lobe-see discussion below]; my preference would be is to avoid prednisone; and hence proceed with Xtandi.  Prescription given.  Discussed with oral pharmacist.  #Right lower lobe~35mm-status post biopsy-positive for fungal infection.  Recommend ID evaluation.   # Hot flashes- on ditropan; hot falshes better  # Bone lesions continue Xgeva; no side effects noted. Continue calcium and vitamin D.   # Genetic counseling -no significant family history except for father with prostate cancer in 63s; discussed re: genetic counselling; again discuss at next visit.  Also discussed regarding molecular testing-will send out for omniseq on the tumor sample.     No orders of the defined types were placed in this encounter.  All questions were answered. The patient knows to call the clinic with any problems, questions or concerns.      Cammie Sickle, MD 10/02/2017 8:12 AM

## 2017-09-24 ENCOUNTER — Telehealth: Payer: Self-pay | Admitting: Pharmacist

## 2017-09-24 ENCOUNTER — Telehealth: Payer: Self-pay | Admitting: Internal Medicine

## 2017-09-24 DIAGNOSIS — C61 Malignant neoplasm of prostate: Secondary | ICD-10-CM

## 2017-09-24 MED ORDER — ENZALUTAMIDE 40 MG PO CAPS: 160.0000 mg | ORAL_CAPSULE | Freq: Every day | ORAL | 6 refills | Status: DC

## 2017-09-24 NOTE — Telephone Encounter (Signed)
Oral Oncology Pharmacist Encounter  Received new prescription for Xtandi (enzalutamide) for the treatment of metastatic prostate cancer, planned duration until disease progression or unacceptable drug toxicity. Patient was previously on both [place] through a clinical trail. The trial has now ended he will proceed with Xtandi alone to avoid the need for prednisone.   BP from 09/21/17 assessed, BP is well controlled. Xtandi prescription dose and frequency assessed.   Current medication list in Epic reviewed, a few DDIs with Xtanid identified: -Xtandi may decrease the concentration of tamsulosin and venlafaxine. Patient should be monitored for decreased effectiveness of tamsulosin and venlafaxine.   Prescription has been e-scribed to the [facility] for benefits analysis and approval.  Oral Oncology Clinic will continue to follow for insurance authorization, copayment issues, initial counseling and start date.  Darl Pikes, PharmD, BCPS Hematology/Oncology Clinical Pharmacist [facility] [phone]  09/24/2017 8:33 AM

## 2017-09-24 NOTE — Telephone Encounter (Signed)
Oral Oncology Patient Advocate Encounter  Received notification from [facility] that prior authorization for Gillermina Phy is required.  PA submitted on CoverMyMeds Key [id number] Status is pending  Oral Oncology Clinic will continue to follow.   [place] Patient Advocate [phone] 09/24/2017 9:20 AM

## 2017-09-26 ENCOUNTER — Telehealth: Payer: Self-pay | Admitting: *Deleted

## 2017-09-26 ENCOUNTER — Telehealth: Payer: Self-pay | Admitting: Internal Medicine

## 2017-09-26 NOTE — Telephone Encounter (Signed)
Dr. [name] requested that I follow up with Dr. Ola Spurr at [facility]. T/C made to Dr. Blane Ohara office and consult set up for patient on Friday - 09/28/17 at 8:45am. Demographic sheet faxed to Dr. Blane Ohara office as requested. Patient notified of new appointment and he states he will be able to keep that appointment on Friday. Will notify Dr. [name]. [name], BSN, MHA, OCN 09/26/2017 10:19 AM

## 2017-09-26 NOTE — Telephone Encounter (Signed)
Oral Oncology Patient Advocate Encounter  Met patient in lobby to complete application for [employer] in an effort to reduce patient's out of pocket expense for Xtandi to $0.    Application completed and faxed to [phone].   Patient assistance phone number for follow up is [phone].   This encounter will be updated until final determination.   [facility] Patient Advocate [phone] 09/26/2017 9:31 AM

## 2017-09-26 NOTE — Telephone Encounter (Signed)
Received t/c from William Wright this morning reporting that he has not yet heard from the ID physician Dr. Ola Spurr. States he wanted to make Dr. [name] aware of this. Dr. [name] informaed and requested that [name], BSN, MHA, OCN 09/26/2017 9:44 AM

## 2017-09-26 NOTE — Telephone Encounter (Addendum)
Oral Oncology Patient Advocate Encounter  Oral Oncology Patient Advocate Encounter  Prior Authorization for Gillermina Phy has been approved.    PA# [id number] Effective dates: 09/25/2017 through 03/28/2018  Co-pay $8,295.62  Oral Oncology Clinic will continue to follow.     [place] Patient Advocate [phone] 09/26/2017 8:34 AM

## 2017-09-27 NOTE — Telephone Encounter (Signed)
Oral Oncology Patient Advocate Encounter  Received notification from [facility] Patient Assistance program that patient has been successfully enrolled into their program to receive Xtandi from the manufacturer at $0 out of pocket until 07/16/2018.   [place] [phone]    I called and spoke with patient.  He knows we will have to re-apply.   Patient knows to call the office with questions or concerns.  Oral Oncology Clinic will continue to follow.  [place] Patient Advocate [phone] 09/27/2017 4:19 PM

## 2017-09-28 DIAGNOSIS — C61 Malignant neoplasm of prostate: Secondary | ICD-10-CM | POA: Diagnosis not present

## 2017-09-28 DIAGNOSIS — Z114 Encounter for screening for human immunodeficiency virus [HIV]: Secondary | ICD-10-CM | POA: Diagnosis not present

## 2017-09-28 DIAGNOSIS — B459 Cryptococcosis, unspecified: Secondary | ICD-10-CM | POA: Diagnosis not present

## 2017-10-12 ENCOUNTER — Encounter: Payer: Self-pay | Admitting: Internal Medicine

## 2017-10-18 ENCOUNTER — Other Ambulatory Visit: Payer: Self-pay | Admitting: Internal Medicine

## 2017-10-18 ENCOUNTER — Inpatient Hospital Stay: Payer: Medicare Other

## 2017-10-18 ENCOUNTER — Encounter: Payer: Self-pay | Admitting: Internal Medicine

## 2017-10-18 ENCOUNTER — Inpatient Hospital Stay (HOSPITAL_BASED_OUTPATIENT_CLINIC_OR_DEPARTMENT_OTHER): Payer: Medicare Other | Admitting: Internal Medicine

## 2017-10-18 ENCOUNTER — Inpatient Hospital Stay: Payer: Medicare Other | Attending: Internal Medicine

## 2017-10-18 VITALS — BP 112/72 | HR 81 | Temp 97.3°F | Wt 167.3 lb

## 2017-10-18 DIAGNOSIS — Z79899 Other long term (current) drug therapy: Secondary | ICD-10-CM | POA: Diagnosis not present

## 2017-10-18 DIAGNOSIS — B998 Other infectious disease: Secondary | ICD-10-CM | POA: Insufficient documentation

## 2017-10-18 DIAGNOSIS — C61 Malignant neoplasm of prostate: Secondary | ICD-10-CM | POA: Diagnosis not present

## 2017-10-18 DIAGNOSIS — N5089 Other specified disorders of the male genital organs: Secondary | ICD-10-CM | POA: Diagnosis not present

## 2017-10-18 DIAGNOSIS — C7951 Secondary malignant neoplasm of bone: Secondary | ICD-10-CM | POA: Diagnosis not present

## 2017-10-18 DIAGNOSIS — I700 Atherosclerosis of aorta: Secondary | ICD-10-CM

## 2017-10-18 DIAGNOSIS — R918 Other nonspecific abnormal finding of lung field: Secondary | ICD-10-CM

## 2017-10-18 DIAGNOSIS — R590 Localized enlarged lymph nodes: Secondary | ICD-10-CM | POA: Diagnosis not present

## 2017-10-18 LAB — CBC WITH DIFFERENTIAL/PLATELET
Basophils Absolute: 0 10*3/uL (ref 0–0.1)
Basophils Relative: 1 %
EOS PCT: 3 %
Eosinophils Absolute: 0.1 10*3/uL (ref 0–0.7)
HEMATOCRIT: 37.8 % — AB (ref 40.0–52.0)
Hemoglobin: 13 g/dL (ref 13.0–18.0)
LYMPHS ABS: 1 10*3/uL (ref 1.0–3.6)
LYMPHS PCT: 27 %
MCH: 33.7 pg (ref 26.0–34.0)
MCHC: 34.5 g/dL (ref 32.0–36.0)
MCV: 97.6 fL (ref 80.0–100.0)
Monocytes Absolute: 0.4 10*3/uL (ref 0.2–1.0)
Monocytes Relative: 10 %
Neutro Abs: 2.2 10*3/uL (ref 1.4–6.5)
Neutrophils Relative %: 59 %
Platelets: 199 10*3/uL (ref 150–440)
RBC: 3.87 MIL/uL — AB (ref 4.40–5.90)
RDW: 12.8 % (ref 11.5–14.5)
WBC: 3.8 10*3/uL (ref 3.8–10.6)

## 2017-10-18 LAB — COMPREHENSIVE METABOLIC PANEL
ALBUMIN: 4 g/dL (ref 3.5–5.0)
ALT: 11 U/L — ABNORMAL LOW (ref 17–63)
AST: 17 U/L (ref 15–41)
Alkaline Phosphatase: 80 U/L (ref 38–126)
Anion gap: 8 (ref 5–15)
BUN: 16 mg/dL (ref 6–20)
CHLORIDE: 109 mmol/L (ref 101–111)
CO2: 25 mmol/L (ref 22–32)
Calcium: 9.2 mg/dL (ref 8.9–10.3)
Creatinine, Ser: 0.91 mg/dL (ref 0.61–1.24)
GFR calc Af Amer: >60 mL/min (ref >60)
GFR calc non Af Amer: >60 mL/min (ref >60)
GLUCOSE: 109 mg/dL — AB (ref 65–99)
POTASSIUM: 3.9 mmol/L (ref 3.5–5.1)
Sodium: 142 mmol/L (ref 135–145)
Total Bilirubin: 0.7 mg/dL (ref 0.3–1.2)
Total Protein: 6.9 g/dL (ref 6.5–8.1)

## 2017-10-18 MED ORDER — DENOSUMAB 120 MG/1.7ML ~~LOC~~ SOLN
120.0000 mg | Freq: Once | SUBCUTANEOUS | Status: AC
  Administered 2017-10-18: 120 mg via SUBCUTANEOUS
  Filled 2017-10-18: qty 1.7

## 2017-10-18 NOTE — Assessment & Plan Note (Addendum)
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 Feb 2019- CT scan C/A/P- Stable  lymphadenopathy in the pelvis and retroperitoneum; EXCEPT RLL- increasing/ nodule ~23m. [s/p Bx- see discussion below;  bone scan-stable. PSA- feb 2019- 11.65  #Patient currently on Xtandi 160 mg a day [since coming of the clinical trial]; tolerating well.  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  Also discussed referral to genetic counselor for further evaluation.  # #Right lower lobe~114mstatus post biopsy-positive for fungal infection.  On Diflucan.   # Hot flashes- on ditropan; hot falshes better  # Bone lesions continue Xgeva; no side effects noted. Continue calcium and vitamin D.   # Genetic counseling -no significant family history [pt redacts saying that his father did not have cancer]; referral to [facility]/genetic counseling.   # follow up in month/x-geva; psa; cbc/cmp.

## 2017-10-18 NOTE — Progress Notes (Signed)
[place] OFFICE PROGRESS NOTE  Patient Care Team: System, Provider Not In as PCP - General Royston Cowper, MD (Urology)  Cancer of prostate [facility])   Staging form: Prostate, AJCC 7th Edition     Clinical: Stage IV (T2, M1) - Signed by Evlyn Kanner, NP on 01/07/2015    Oncology History   # FEB 2014- Prostate cancer Stage II [T2N0]; PA-18; [Dr.Wolfe]  # May 2015-STAGE IV;  PSA 90 on ADT; CT/Bone scan-extensive mets;  casodex+ Trelstar  # June 2016- Castration resistant prostate cancer/[facility] protocol- ZYTIGA plus minus XTANDI; June 28th-CT-C/A/P- Stable Retrocrural/RP/Pelvic LN;Bone scan- Stable sclerotic lesions  # MARCH 2019- Taken off trial [ based on interm analysis]; continue X-tandi [given not needing steroids]   # RLL [~11m lung nodule] s/p Bx- Cryptococcus- ID/Dr.Fitzgerald- on diflucan   # Xgeva     [place]cer of prostate ([place]   08/17/2012 Initial Diagnosis    Cancer of prostate, stage II      12/03/2013 Progression         10/13/2014 Progression          Prostate cancer metastatic to multiple sites ([facility]     INTERVAL HISTORY:  William [id number].  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  Patient interim has been evaluated by infectious disease; is currently on Diflucan.  Patient denies any shortness of breath or cough.  His hot flashes have significantly improved since starting Ditropan. Patient denies any unusual weight loss or nausea vomiting or diarrhea. He denies any jaw pain.  REVIEW OF SYSTEMS:  A complete 10 point review of system is done which is negative except mentioned above/history of present illness.   PAST MEDICAL HISTORY :  Past Medical History:  Diagnosis Date  . Cancer of prostate ([place] 07/2012  . [place]state cancer ([place]      PAST SURGICAL HISTORY :   Past Surgical History:  Procedure Laterality Date  . EXTRACORPOREAL SHOCK WAVE LITHOTRIPSY Left 05/20/2015   Procedure: EXTRACORPOREAL SHOCK WAVE LITHOTRIPSY (ESWL);  Surgeon: MRoyston Cowper MD;  Location: [facility] ORS;  Service: Urology;  Laterality: Left;  . EXTRACORPOREAL SHOCK WAVE LITHOTRIPSY Right 12/21/2016   Procedure: EXTRACORPOREAL SHOCK WAVE LITHOTRIPSY (ESWL);  Surgeon: WRoyston Cowper MD;  Location: [facility] ORS;  Service: Urology;  Laterality: Right;     FAMILY HISTORY :  History reviewed. No pertinent family history. dad prostate cancer- in 626s mom's dad- lung/smoker; girl/ boy- 362 462years  SOCIAL HISTORY:   Social History   Tobacco Use  . Smoking status: Never Smoker  . Smokeless tobacco: Never Used  Substance Use Topics  . Alcohol use: No  . Drug use: No    ALLERGIES:  is allergic to no known allergies.  MEDICATIONS:  Current Outpatient Medications  Medication Sig Dispense Refill  . enzalutamide (XTANDI) 40 MG capsule Take 4 capsules (160 mg total) by mouth daily. 120 capsule 6  . oxybutynin (DITROPAN) 5 MG tablet Take 5 mg by mouth daily.    . tamsulosin (FLOMAX) 0.4 MG CAPS capsule Take 1 capsule (0.4 mg total) by mouth daily. 30 capsule 11  . Triptorelin Pamoate (TRELSTAR) 22.5 MG injection Inject 22.5 mg into the muscle every 6 (six) months.    . valACYclovir (VALTREX) 1000 MG tablet Take 1 tablet (1,000 mg total) by mouth  2 (two) times daily. 60 tablet 6  . venlafaxine XR (EFFEXOR-XR) 37.5 MG 24 hr capsule Take 1 capsule (37.5 mg total) by mouth daily with breakfast. 30 capsule 11  . NUCYNTA 50 MG tablet Take 1 tablet (50 mg total) by mouth every 6 (six) hours as needed for moderate pain. 1 TO 2 TABS Q 6 HOURS PRN PAIN (Patient not taking: Reported on 10/18/2017) 30 tablet 0   No current facility-administered medications for this visit.     PHYSICAL EXAMINATION: ECOG PERFORMANCE STATUS: 0 - Asymptomatic  BP 112/72 (BP Location:  Left Arm, Patient Position: Sitting)   Pulse 81   Temp (!) 97.3 F (36.3 C)   Wt 167 lb 4.8 oz (75.9 kg)   BMI 22.69 kg/m   Filed Weights   10/18/17 1115 10/18/17 1122  Weight: 167 lb (75.8 kg) 167 lb 4.8 oz (75.9 kg)    GENERAL: Well-nourished well-developed; Alert, no distress and comfortable. He is Alone. EYES: no pallor or icterus OROPHARYNX: no thrush or ulceration; good dentition  NECK: supple, no masses felt LYMPH:  no palpable lymphadenopathy in the cervical, axillary or inguinal regions LUNGS: clear to auscultation and  No wheeze or crackles HEART/CVS: regular rate & rhythm and no murmurs; No lower extremity edema ABDOMEN:abdomen soft, non-tender and normal bowel sounds Musculoskeletal:no cyanosis of digits and no clubbing;  PSYCH: alert & oriented x 3 with fluent speech NEURO: no focal motor/sensory deficits SKIN:  no rashes or significant lesions  LABORATORY DATA:  I have reviewed the data as listed    Component Value Date/Time   NA 142 10/18/2017 1030   NA 137 11/12/2014 1415   K 3.9 10/18/2017 1030   K 4.1 11/12/2014 1415   CL 109 10/18/2017 1030   CL 105 11/12/2014 1415   CO2 25 10/18/2017 1030   CO2 24 11/12/2014 1415   GLUCOSE 109 (H) 10/18/2017 1030   GLUCOSE 98 11/12/2014 1415   BUN 16 10/18/2017 1030   BUN 24 (H) 11/12/2014 1415   CREATININE 0.91 10/18/2017 1030   CREATININE 1.02 11/12/2014 1415   CALCIUM 9.2 10/18/2017 1030   CALCIUM 9.2 11/12/2014 1415   PROT 6.9 10/18/2017 1030   PROT 7.4 11/12/2014 1415   ALBUMIN 4.0 10/18/2017 1030   ALBUMIN 4.4 11/12/2014 1415   AST 17 10/18/2017 1030   AST 23 11/12/2014 1415   ALT 11 (L) 10/18/2017 1030   ALT 16 (L) 11/12/2014 1415   ALKPHOS 80 10/18/2017 1030   ALKPHOS 103 11/12/2014 1415   BILITOT 0.7 10/18/2017 1030   BILITOT 0.4 11/12/2014 1415   GFRNONAA >60 10/18/2017 1030   GFRNONAA >60 11/12/2014 1415   GFRAA >60 10/18/2017 1030   GFRAA >60 11/12/2014 1415    No results found for: SPEP,  UPEP  Lab Results  Component Value Date   WBC 3.8 10/18/2017   NEUTROABS 2.2 10/18/2017   HGB 13.0 10/18/2017   HCT 37.8 (L) 10/18/2017   MCV 97.6 10/18/2017   PLT 199 10/18/2017      Chemistry      Component Value Date/Time   NA 142 10/18/2017 1030   NA 137 11/12/2014 1415   K 3.9 10/18/2017 1030   K 4.1 11/12/2014 1415   CL 109 10/18/2017 1030   CL 105 11/12/2014 1415   CO2 25 10/18/2017 1030   CO2 24 11/12/2014 1415   BUN 16 10/18/2017 1030   BUN 24 (H) 11/12/2014 1415   CREATININE 0.91 10/18/2017 1030   CREATININE 1.02  11/12/2014 1415      Component Value Date/Time   CALCIUM 9.2 10/18/2017 1030   CALCIUM 9.2 11/12/2014 1415   ALKPHOS 80 10/18/2017 1030   ALKPHOS 103 11/12/2014 1415   AST 17 10/18/2017 1030   AST 23 11/12/2014 1415   ALT 11 (L) 10/18/2017 1030   ALT 16 (L) 11/12/2014 1415   BILITOT 0.7 10/18/2017 1030   BILITOT 0.4 11/12/2014 1415      RADIOGRAPHIC STUDIES: I have personally reviewed the radiological images as listed and agreed with the findings in the report. No results found.  IMPRESSION: 1. Interval enlargement of what is now an 11 x 15 mm right lower lobe nodule, concerning for metastatic lesion. No other sites of new extra skeletal metastatic disease are identified on today's examination. 2. Similar appearance of widespread osseous metastatic disease compared to the prior examination. 3. Aortic atherosclerosis, in addition to left anterior descending coronary artery disease. Please note that although the presence of coronary artery calcium documents the presence of coronary artery disease, the severity of this disease and any potential stenosis cannot be assessed on this non-gated CT examination. Assessment for potential risk factor modification, dietary therapy or pharmacologic therapy may be warranted, if clinically indicated. 4. There are calcifications of the aortic valve. Echocardiographic correlation for evaluation of  potential valvular dysfunction may be warranted if clinically indicated. 5. 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 Attention on routine followup examinations is recommended.  Aortic Atherosclerosis (ICD10-I70.0). Aortic aneurysm NOS (ICD10-I71.9).   Electronically Signed   By: Vinnie Langton M.D.   On: 08/22/2017 14:35  Results for LADELL, LEA (MRN [id number]) as of 08/23/2017 10:48  Ref. Range 11/07/2012 09:17 11/14/2012 09:14 11/21/2012 09:05 11/28/2012 09:00 10/07/2014 10:31 11/12/2014 14:15 11/25/2014 10:16 12/09/2014 11:25 12/23/2014 14:01 01/06/2015 15:09 01/20/2015 13:47 02/03/2015 11:24 02/17/2015 10:36 03/16/2015 09:38 03/16/2015 11:00 04/14/2015 11:30 05/12/2015 11:01 06/09/2015 10:23 07/07/2015 11:16 08/04/2015 09:31 09/01/2015 09:50 09/29/2015 08:33 10/27/2015 10:49 11/24/2015 10:25 12/22/2015 09:20 01/19/2016 10:20 02/16/2016 10:33 03/16/2016 10:22 04/12/2016 09:20 05/10/2016 08:46 06/07/2016 09:51 07/05/2016 09:27 08/02/2016 10:25 08/30/2016 10:00 09/27/2016 09:45 10/25/2016 09:04 11/23/2016 09:28 12/20/2016 13:29 01/18/2017 10:23 02/14/2017 09:27 03/14/2017 09:54 04/11/2017 09:28 04/11/2017 09:35 05/09/2017 10:45 06/06/2017 10:14 06/06/2017 10:15 07/04/2017 09:44 08/01/2017 09:18 08/01/2017 09:25 08/22/2017 11:15 08/22/2017 11:35  PSA Latest Ref Range: 0.00 - 4.00 ng/mL      59.6 (H)   22.91 (H)  19.93 (H)  21.38 (H) 37.45 (H)  20.91 (H) 22.38 (H) 19.42 (H) 21.73 (H) 22.36 (H) 18.52 (H) 17.28 (H) 17.00 (H) 15.69 (H) 15.06 (H) 17.52 (H) 14.96 (H) 13.12 (H) 16.38 (H) 17.38 (H) 13.84 (H) 14.22 (H) 15.38 (H) 12.22 (H) 12.23 (H) 9.49 (H) 9.29 (H)                Prostatic Specific Antigen Latest Ref Range: 0.00 - 4.00 ng/mL                                      13.26 (H) 10.46 (H) 12.49 (H) 9.66 (H) 9.75 (H)  9.74 (H) 10.42 (H)  12.09 (H) 12.87 (H)  11.65 (H)     ASSESSMENT & PLAN:  Prostate cancer metastatic to multiple sites [facility]) 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 Feb 2019- CT scan C/A/P- Stable  lymphadenopathy in the pelvis and retroperitoneum;  EXCEPT RLL- increasing/ nodule ~43m. [s/p Bx- see discussion below;  bone scan-stable. PSA- feb 2019- 11.65  #Patient currently on Xtandi 160 mg a day [since coming of the clinical trial]; tolerating well.  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  Also discussed referral to genetic counselor for further evaluation.  # #Right lower lobe~162mstatus post biopsy-positive for fungal infection.  On Diflucan.   # Hot flashes- on ditropan; hot falshes better  # Bone lesions continue Xgeva; no side effects noted. Continue calcium and vitamin D.   # Genetic counseling -no significant family history [pt redacts saying that his father did not have cancer]; referral to [place]/genetic counseling.   # follow up in month/x-geva; psa; cbc/cmp.    Orders Placed This Encounter  Procedures  . PSA    Standing Status:   Future    Standing Expiration Date:   10/19/2018  . CBC with Differential/Platelet    Standing Status:   Future    Standing Expiration Date:   10/19/2018  . Comprehensive metabolic panel    Standing Status:   Future    Standing Expiration Date:   10/19/2018   All questions were answered. The patient knows to call the clinic with any problems, questions or concerns.      GoCammie SickleMD 10/26/2017 12:34 PM

## 2017-10-24 ENCOUNTER — Telehealth: Payer: Self-pay | Admitting: *Deleted

## 2017-10-24 NOTE — Telephone Encounter (Signed)
Received t/c from Polly Cobia stating he has had some mild swelling in his ankles for about a week and that he forgot to mention it to Dr. [name] at his appointment last week. States he does not have any swelling when he gets up in the morning, but as the day progresses, he notices some mild swelling that is not painful, but does leave a mark when he takes his socks off. He also reports this happened about a year ago and he followed up with Dr. Lucky Cowboy, but the swelling eventually subsided on its own. Informed Dr. [name] about this and he states we will just watch this for now and that the patient should let us know if it worsens before his next office visit. Dr. [name] states the [facility] that the patient was on before might cause a little swelling; however the [facility] he currently takes should not be causing this. T/C made back to patient and message left that he should watch the swelling for now, but let us know if it becomes worse. [name], BSN, MHA, OCN 10/24/2017 2:41 PM

## 2017-10-26 ENCOUNTER — Telehealth: Payer: Self-pay | Admitting: Internal Medicine

## 2017-10-26 NOTE — Telephone Encounter (Signed)
Burnetta Sabin think I discussed this with the patient].]please check with patient regarding genetic counseling-given the somatic BRCA-2 mutation present in the tumor sample.   # Please make a referral to genetic counseling to [place] counsellor from [facility]- who will be here on 4/25.

## 2017-10-26 NOTE — Telephone Encounter (Signed)
Ria Comment, what others days are you available to see this patient?

## 2017-11-08 ENCOUNTER — Ambulatory Visit: Payer: Medicare Other

## 2017-11-08 ENCOUNTER — Inpatient Hospital Stay (HOSPITAL_BASED_OUTPATIENT_CLINIC_OR_DEPARTMENT_OTHER): Payer: Medicare Other | Admitting: Genetics

## 2017-11-08 ENCOUNTER — Encounter: Payer: Self-pay | Admitting: Genetics

## 2017-11-08 DIAGNOSIS — C61 Malignant neoplasm of prostate: Secondary | ICD-10-CM

## 2017-11-08 DIAGNOSIS — Z1379 Encounter for other screening for genetic and chromosomal anomalies: Secondary | ICD-10-CM

## 2017-11-08 DIAGNOSIS — C7951 Secondary malignant neoplasm of bone: Secondary | ICD-10-CM

## 2017-11-08 DIAGNOSIS — Z806 Family history of leukemia: Secondary | ICD-10-CM

## 2017-11-08 NOTE — Progress Notes (Signed)
REFERRING PROVIDER: Cammie Sickle, MD [place], [place] [address]  PRIMARY PROVIDER:  System, Provider Not In  PRIMARY REASON FOR VISIT:  1. Cancer of prostate ([name])   2. Family history of leukemia   3. Prostate cancer metastatic to multiple sites ([place])   4. Prostate cancer ([place])   5. Malignant neoplasm of prostate ([name])   6. Prostate cancer metastatic to bone ([facility])   7. CA of prostate ([name])     HISTORY OF PRESENT ILLNESS:   [name], a 67 y.o. male, was seen for a [place] cancer genetics consultation at the request of Dr. [name] due to a personal and family history of cancer.  [name] presents to clinic today to discuss the possibility of a hereditary predisposition to cancer, genetic testing, and to further clarify his future cancer risks, as well as potential cancer risks for family members.   In Feb 2014, at the age of 74, [name] was diagnosed with prostate cancer.Gleason 4+3 . Tumor sequencing reported out in March 2019 (sample taken Jan 2014). Revealed a BRCA2 tumor mtutaion c.10150C>T.   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  Says he has had a few  in the past ' no problems' were found.     Past Medical History:  Diagnosis Date  . [place] of [place] ([place] 07[place]  . Family history of leukemia   . [facility]state [facility] ([facility]     Past Surgical History:  Procedure Laterality Date  . EXTRACORPOREAL SHOCK WAVE LITHOTRIPSY Left 05/20/2015   Procedure: EXTRACORPOREAL SHOCK WAVE LITHOTRIPSY (ESWL);  Surgeon: MRoyston Cowper MD;  Location: [facility] ORS;  Service: Urology;  Laterality: Left;  . EXTRACORPOREAL SHOCK WAVE LITHOTRIPSY Right 12/21/2016   Procedure: EXTRACORPOREAL SHOCK WAVE LITHOTRIPSY (ESWL);  Surgeon: WRoyston Cowper MD;  Location: [facility] ORS;  Service: Urology;  Laterality: Right;    Social History   Socioeconomic History  . Marital status: Married    Spouse name: Not on file  . Number of children: Not on file  . Years of education: Not on file  . Highest education level: Not on file  Occupational History  . Not on file  Social Needs  . Financial resource strain: Not on file  . Food insecurity:    Worry: Not on file    Inability: Not on file  . Transportation needs:    Medical: Not on file    Non-medical: Not on file  Tobacco Use  . Smoking status: Never Smoker  . Smokeless tobacco: Never Used  Substance and Sexual Activity  . Alcohol use: No  . Drug use: No  . Sexual  activity: Not on file  Lifestyle  . Physical activity:    Days per week: Not on file    Minutes per session: Not on file  . Stress: Not on file  Relationships  . Social connections:    Talks on phone: Not on file    Gets together: Not on file    Attends religious service: Not on file    Active member of club or organization: Not on file    Attends meetings of clubs or organizations: Not on file    Relationship status: Not on file  Other Topics Concern  . Not on file  Social History Narrative  . Not on file     FAMILY HISTORY:  We obtained a detailed, 4-generation family history.  Significant diagnoses are listed below: Family History   Problem Relation Age of Onset  . Alzheimer's disease Father   . Lung cancer Maternal Grandfather 20  . Leukemia Paternal Grandmother 58  . COPD Paternal Grandfather   . Cancer Maternal Uncle        type unk, dx 76's   [name] has a 65 year-old daughter and a 74 year-old son with no history of cancer.  He has 3 grandchildren.  [name] has 2 sisters age 71 and 14 with no history of cancer.  He does not know if they still have their uterus or ovaries intact.   Mr. [name] father: died at 51, had Alzheimer's disease.  Paternal Aunts/Uncles: 2 maternal uncles died as children (1 due to pneumonia, and the other in a accident).  1 paternal aunt died in her 35's/70's with no history of cancer.  Paternal cousins: 2 cousins, no history of cancer.  Paternal grandfather: COPD/ Emphysema Paternal grandmother:leukemia dx in 55's  [name] mother: 2, no history of cancer.  [name] believes she had a hysterectomy. Maternal Aunts/Uncles: 1 maternal uncle is in his 2's and has cancer- type unk Maternal cousins: 1 maternal male cousin, no history of cancer. Maternal grandfather: died of lung cancer at 18 Maternal grandmother:died at 62, no history of cancer.   [name] is unaware of previous family history of genetic testing for hereditary cancer risks. Patient's maternal ancestors are of Caucasian descent, and paternal ancestors are of Scottish/Irish descent. There is no reported Ashkenazi Jewish ancestry. There is no known consanguinity.  GENETIC COUNSELING ASSESSMENT: [name] is a 67 y.o. male with a personal history of a BRCA2 mutation identified in his tumor, which is somewhat suggestive of a Hereditary Cancer Predisposition Syndrome. We, therefore, discussed and recommended the following at today's visit.   DISCUSSION: We reviewed the characteristics, features and inheritance patterns of hereditary cancer syndromes. We also discussed genetic testing, including  the appropriate family members to test, the process of testing, insurance coverage and turn-around-time for results. We discussed the implications of a negative, positive and/or variant of uncertain significant result. We recommended Mr. Codner pursue genetic testing for the Common Hereditary CAncer gene panel.   The Common Hereditary Cancer Panel offered by [facility] includes sequencing and/or deletion duplication testing of the following 47 genes: APC, ATM, AXIN2, BARD1, BMPR1A, BRCA1, BRCA2, BRIP1, CDH1, CDKN2A (p14ARF), CDKN2A (p16INK4a), CKD4, CHEK2, CTNNA1, DICER1, EPCAM (Deletion/duplication testing only), GREM1 (promoter region deletion/duplication testing only), KIT, MEN1, MLH1, MSH2, MSH3, MSH6, MUTYH, NBN, NF1, NHTL1, PALB2, PDGFRA, PMS2, POLD1, POLE, PTEN, RAD50, RAD51C, RAD51D, SDHB, SDHC, SDHD, SMAD4, SMARCA4. STK11, TP53, TSC1, TSC2, and VHL.  The following genes were evaluated for sequence changes only: SDHA  and HOXB13 c.251G>A variant only.  We discussed that only 5-10% of cancers are associated with a Hereditary cancer predisposition syndrome.  One of the most common hereditary cancer syndromes that increases prostate cancer risk is called Hereditary Breast and Ovarian Cancer (HBOC) syndrome.  This syndrome is caused by mutations in the BRCA1 and BRCA2 genes.  This syndrome increases an individual's lifetime risk to develop breast, ovarian, pancreatic, and other types of cancer.  There are also many other cancer predisposition syndromes caused by mutations in several other genes.  We discussed that if she is found to have a mutation in one of these genes, it may impact future medical management recommendations such as increased cancer screenings and consideration of risk reducing surgeries.  A positive result could also have implications for the patient's family members.  A Negative result would mean we were unable to identify a hereditary component to his cancer, but does not rule out the  possibility of a hereditary basis for his cancer.  There could be mutations that are undetectable by current technology, or in genes not yet tested or identified to increase cancer risk.    We discussed the potential to find a Variant of Uncertain Significance or VUS.  These are variants that have not yet been identified as pathogenic or benign, and it is unknown if this variant is associated with increased cancer risk or if this is a normal finding.  Most VUS's are reclassified to benign or likely benign.   It should not be used to make medical management decisions. With time, we suspect the lab will determine the significance of any VUS's identified if any.   Based on Mr. Batz personal history of cancer, he meets medical criteria for genetic testing. Despite that he meets criteria, he may still have an out of pocket cost. We discussed that if his out of pocket cost for testing is over $100, the laboratory will call and confirm whether he wants to proceed with testing.  If the out of pocket cost of testing is less than $100 he will be billed by the genetic testing laboratory.   PLAN: After considering the risks, benefits, and limitations, Mr. Reiling  provided informed consent to pursue genetic testing.  A blood sample will be obtained at his next lab draw 11/16/2017. It will be sent to [facility] for analysis of the Common Hereditary Cancers Panel. Results should be available within approximately 2-3 weeks' time, at which point they will be disclosed by telephone to Mr. Corvin, as will any additional recommendations warranted by these results. Mr. Monforte will receive a summary of his genetic counseling visit and a copy of his results once available. This information will also be available in Epic. We encouraged Mr. Arguijo to remain in contact with cancer genetics annually so that we can continuously update the family history and inform him of any changes in cancer genetics and testing  that may be of benefit for his family. Mr. Degregorio questions were answered to his satisfaction today. Our contact information was provided should additional questions or concerns arise.  Lastly, we encouraged Mr. Bidinger to remain in contact with cancer genetics annually so that we can continuously update the family history and inform him of any changes in cancer genetics and testing that may be of benefit for this family.   Mr.  Coryell questions were answered to his satisfaction today. Our contact information was provided should additional questions or concerns arise. Thank you for the referral and allowing  [place] to share in the care of your patient.   Tana Felts, MS, [facility] Certified Genetic Counselor [email or web address] phone: [phone]  The patient was seen for a total of 35 minutes in face-to-face genetic counseling.

## 2017-11-12 ENCOUNTER — Other Ambulatory Visit: Payer: Self-pay | Admitting: Genetics

## 2017-11-12 DIAGNOSIS — C61 Malignant neoplasm of prostate: Secondary | ICD-10-CM

## 2017-11-15 ENCOUNTER — Encounter: Payer: Self-pay | Admitting: Internal Medicine

## 2017-11-15 ENCOUNTER — Inpatient Hospital Stay (HOSPITAL_BASED_OUTPATIENT_CLINIC_OR_DEPARTMENT_OTHER): Payer: Medicare Other | Admitting: Internal Medicine

## 2017-11-15 ENCOUNTER — Inpatient Hospital Stay: Payer: Medicare Other

## 2017-11-15 ENCOUNTER — Other Ambulatory Visit: Payer: Self-pay

## 2017-11-15 ENCOUNTER — Inpatient Hospital Stay: Payer: Medicare Other | Attending: Internal Medicine

## 2017-11-15 VITALS — BP 124/72 | Resp 16 | Wt 161.2 lb

## 2017-11-15 DIAGNOSIS — R911 Solitary pulmonary nodule: Secondary | ICD-10-CM | POA: Insufficient documentation

## 2017-11-15 DIAGNOSIS — I2510 Atherosclerotic heart disease of native coronary artery without angina pectoris: Secondary | ICD-10-CM

## 2017-11-15 DIAGNOSIS — C61 Malignant neoplasm of prostate: Secondary | ICD-10-CM

## 2017-11-15 DIAGNOSIS — R600 Localized edema: Secondary | ICD-10-CM

## 2017-11-15 DIAGNOSIS — Z79899 Other long term (current) drug therapy: Secondary | ICD-10-CM

## 2017-11-15 DIAGNOSIS — M549 Dorsalgia, unspecified: Secondary | ICD-10-CM

## 2017-11-15 DIAGNOSIS — Z806 Family history of leukemia: Secondary | ICD-10-CM | POA: Insufficient documentation

## 2017-11-15 DIAGNOSIS — Z808 Family history of malignant neoplasm of other organs or systems: Secondary | ICD-10-CM | POA: Diagnosis not present

## 2017-11-15 DIAGNOSIS — Z8042 Family history of malignant neoplasm of prostate: Secondary | ICD-10-CM | POA: Insufficient documentation

## 2017-11-15 DIAGNOSIS — C7951 Secondary malignant neoplasm of bone: Secondary | ICD-10-CM | POA: Insufficient documentation

## 2017-11-15 DIAGNOSIS — R232 Flushing: Secondary | ICD-10-CM | POA: Diagnosis not present

## 2017-11-15 DIAGNOSIS — Z79818 Long term (current) use of other agents affecting estrogen receptors and estrogen levels: Secondary | ICD-10-CM | POA: Diagnosis not present

## 2017-11-15 DIAGNOSIS — R21 Rash and other nonspecific skin eruption: Secondary | ICD-10-CM | POA: Insufficient documentation

## 2017-11-15 DIAGNOSIS — Z801 Family history of malignant neoplasm of trachea, bronchus and lung: Secondary | ICD-10-CM | POA: Diagnosis not present

## 2017-11-15 LAB — CBC WITH DIFFERENTIAL/PLATELET
Basophils Absolute: 0 10*3/uL (ref 0–0.1)
Basophils Relative: 1 %
EOS ABS: 0.1 10*3/uL (ref 0–0.7)
EOS PCT: 3 %
HCT: 40.3 % (ref 40.0–52.0)
Hemoglobin: 14.2 g/dL (ref 13.0–18.0)
LYMPHS ABS: 1.1 10*3/uL (ref 1.0–3.6)
LYMPHS PCT: 29 %
MCH: 33.9 pg (ref 26.0–34.0)
MCHC: 35.3 g/dL (ref 32.0–36.0)
MCV: 96 fL (ref 80.0–100.0)
MONO ABS: 0.4 10*3/uL (ref 0.2–1.0)
Monocytes Relative: 11 %
Neutro Abs: 2.2 10*3/uL (ref 1.4–6.5)
Neutrophils Relative %: 56 %
PLATELETS: 219 10*3/uL (ref 150–440)
RBC: 4.2 MIL/uL — AB (ref 4.40–5.90)
RDW: 12.9 % (ref 11.5–14.5)
WBC: 4 10*3/uL (ref 3.8–10.6)

## 2017-11-15 LAB — COMPREHENSIVE METABOLIC PANEL
ALT: 9 U/L — AB (ref 17–63)
AST: 18 U/L (ref 15–41)
Albumin: 4.2 g/dL (ref 3.5–5.0)
Alkaline Phosphatase: 81 U/L (ref 38–126)
Anion gap: 11 (ref 5–15)
BUN: 14 mg/dL (ref 6–20)
CHLORIDE: 104 mmol/L (ref 101–111)
CO2: 24 mmol/L (ref 22–32)
CREATININE: 1.05 mg/dL (ref 0.61–1.24)
Calcium: 9.6 mg/dL (ref 8.9–10.3)
GFR calc Af Amer: >60 mL/min (ref >60)
GFR calc non Af Amer: >60 mL/min (ref >60)
GLUCOSE: 93 mg/dL (ref 65–99)
POTASSIUM: 4 mmol/L (ref 3.5–5.1)
Sodium: 139 mmol/L (ref 135–145)
Total Bilirubin: 0.7 mg/dL (ref 0.3–1.2)
Total Protein: 7.2 g/dL (ref 6.5–8.1)

## 2017-11-15 LAB — PSA: Prostatic Specific Antigen: 12.51 ng/mL — ABNORMAL HIGH (ref 0.00–4.00)

## 2017-11-15 MED ORDER — DENOSUMAB 120 MG/1.7ML ~~LOC~~ SOLN
120.0000 mg | Freq: Once | SUBCUTANEOUS | Status: AC
  Administered 2017-11-15: 120 mg via SUBCUTANEOUS
  Filled 2017-11-15: qty 1.7

## 2017-11-15 NOTE — Progress Notes (Signed)
William OFFICE PROGRESS NOTE  Patient Care Team: System, Provider Not In as PCP - General Royston Cowper, MD (Urology)  Cancer of prostate [facility])   Staging form: Prostate, AJCC 7th Edition     Clinical: Stage IV (T2, M1) - Signed by Evlyn Kanner, NP on 01/07/2015    Oncology History   # FEB 2014- Prostate cancer Stage II [T2N0]; PA-18; [Dr.Wolfe]  # May 2015-STAGE IV;  PSA 90 on ADT; CT/Bone scan-extensive mets;  casodex+ Trelstar  # June 2016- Castration resistant prostate cancer/[facility] protocol- ZYTIGA plus minus XTANDI; June 28th-CT-C/A/P- Stable Retrocrural/RP/Pelvic LN;Bone scan- Stable sclerotic lesions  # MARCH 2019- Taken off trial [ based on interm analysis]; continue X-tandi [given not needing steroids]   # RLL [~34m lung nodule] s/p Bx- Cryptococcus- ID/Dr.Fitzgerald- on diflucan   # Xgeva     Cancer of prostate ([place]   08/17/2012 Initial Diagnosis    Cancer of prostate, stage II      12/03/2013 Progression         10/13/2014 Progression          Prostate cancer metastatic to multiple sites ([facility]     INTERVAL HISTORY:  William MANNELLA89y.o.  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  Patient noted to have mild swelling of his bilateral lower extremities/also rash on his legs.  This improves with elevation.  Denies any unusual shortness of breath or cough.  Appetite is good.  No weight loss.  Hot flashes improved.  Denies any unusual fatigue.  Denies any jaw pain.  REVIEW OF SYSTEMS:  A complete 10 point review of system is done which is negative except mentioned above/history of present illness.   PAST MEDICAL HISTORY :  Past Medical History:  Diagnosis Date  . [place]  . Family history of leukemia   . Prostate cancer ([place]     PAST SURGICAL HISTORY :   Past Surgical History:  Procedure Laterality Date  .  EXTRACORPOREAL SHOCK WAVE LITHOTRIPSY Left 05/20/2015   Procedure: EXTRACORPOREAL SHOCK WAVE LITHOTRIPSY (ESWL);  Surgeon: MRoyston Cowper MD;  Location: [facility] ORS;  Service: Urology;  Laterality: Left;  . EXTRACORPOREAL SHOCK WAVE LITHOTRIPSY Right 12/21/2016   Procedure: EXTRACORPOREAL SHOCK WAVE LITHOTRIPSY (ESWL);  Surgeon: WRoyston Cowper MD;  Location: [facility] ORS;  Service: Urology;  Laterality: Right;     FAMILY HISTORY :   Family History  Problem Relation Age of Onset  . Alzheimer's disease Father   . Lung cancer Maternal Grandfather 648 . Leukemia Paternal Grandmother 745 . COPD Paternal Grandfather   . Cancer Maternal Uncle        type unk, dx 887's  dad prostate cancer- in 674s mom's dad- lung/smoker; girl/ boy- 341 [id number]  SOCIAL HISTORY:   Social History   Tobacco Use  . Smoking status: Never Smoker  . Smokeless tobacco: Never Used  Substance Use Topics  . Alcohol use: No  . Drug use: No    ALLERGIES:  is allergic to no known allergies.  MEDICATIONS:  Current Outpatient Medications  Medication Sig Dispense Refill  . enzalutamide (XTANDI) 40 MG capsule Take 4 capsules (160 mg total) by mouth daily. 120 capsule 6  . oxybutynin (DITROPAN) 5 MG tablet Take 5 mg by mouth daily.    . tamsulosin (FLOMAX) 0.4 MG CAPS capsule Take 1 capsule (0.4 mg total) by  mouth daily. 30 capsule 11  . Triptorelin Pamoate (TRELSTAR) 22.5 MG injection Inject 22.5 mg into the muscle every 6 (six) months.    . valACYclovir (VALTREX) 1000 MG tablet Take 1 tablet (1,000 mg total) by mouth 2 (two) times daily. 60 tablet 6  . venlafaxine XR (EFFEXOR-XR) 37.5 MG 24 hr capsule Take 1 capsule (37.5 mg total) by mouth daily with breakfast. 30 capsule 11  . NUCYNTA 50 MG tablet Take 1 tablet (50 mg total) by mouth every 6 (six) hours as needed for moderate pain. 1 TO 2 TABS Q 6 HOURS PRN PAIN (Patient not taking: Reported on 10/18/2017) 30 tablet 0   No current facility-administered medications for  this visit.     PHYSICAL EXAMINATION: ECOG PERFORMANCE STATUS: 0 - Asymptomatic  BP 124/72 (BP Location: Left Arm, Patient Position: Sitting)   Resp 16   Wt 161 lb 3.2 oz (73.1 kg)   BMI 21.86 kg/m   Filed Weights   11/15/17 1035  Weight: 161 lb 3.2 oz (73.1 kg)    GENERAL: Well-nourished well-developed; Alert, no distress and comfortable. He is Alone. EYES: no pallor or icterus OROPHARYNX: no thrush or ulceration; good dentition  NECK: supple, no masses felt LYMPH:  no palpable lymphadenopathy in the cervical, axillary or inguinal regions LUNGS: clear to auscultation and  No wheeze or crackles HEART/CVS: regular rate & rhythm and no murmurs; No lower extremity edema ABDOMEN:abdomen soft, non-tender and normal bowel sounds Musculoskeletal:no cyanosis of digits and no clubbing;  PSYCH: alert & oriented x 3 with fluent speech NEURO: no focal motor/sensory deficits SKIN:  no rashes or significant lesions  LABORATORY DATA:  I have reviewed the data as listed    Component Value Date/Time   NA 139 11/15/2017 0956   NA 137 11/12/2014 1415   K 4.0 11/15/2017 0956   K 4.1 11/12/2014 1415   CL 104 11/15/2017 0956   CL 105 11/12/2014 1415   CO2 24 11/15/2017 0956   CO2 24 11/12/2014 1415   GLUCOSE 93 11/15/2017 0956   GLUCOSE 98 11/12/2014 1415   BUN 14 11/15/2017 0956   BUN 24 (H) 11/12/2014 1415   CREATININE 1.05 11/15/2017 0956   CREATININE 1.02 11/12/2014 1415   CALCIUM 9.6 11/15/2017 0956   CALCIUM 9.2 11/12/2014 1415   PROT 7.2 11/15/2017 0956   PROT 7.4 11/12/2014 1415   ALBUMIN 4.2 11/15/2017 0956   ALBUMIN 4.4 11/12/2014 1415   AST 18 11/15/2017 0956   AST 23 11/12/2014 1415   ALT 9 (L) 11/15/2017 0956   ALT 16 (L) 11/12/2014 1415   ALKPHOS 81 11/15/2017 0956   ALKPHOS 103 11/12/2014 1415   BILITOT 0.7 11/15/2017 0956   BILITOT 0.4 11/12/2014 1415   GFRNONAA >60 11/15/2017 0956   GFRNONAA >60 11/12/2014 1415   GFRAA >60 11/15/2017 0956   GFRAA >60  11/12/2014 1415    No results found for: SPEP, UPEP  Lab Results  Component Value Date   WBC 4.0 11/15/2017   NEUTROABS 2.2 11/15/2017   HGB 14.2 11/15/2017   HCT 40.3 11/15/2017   MCV 96.0 11/15/2017   PLT 219 11/15/2017      Chemistry      Component Value Date/Time   NA 139 11/15/2017 0956   NA 137 11/12/2014 1415   K 4.0 11/15/2017 0956   K 4.1 11/12/2014 1415   CL 104 11/15/2017 0956   CL 105 11/12/2014 1415   CO2 24 11/15/2017 0956   CO2 24 11/12/2014  1415   BUN 14 11/15/2017 0956   BUN 24 (H) 11/12/2014 1415   CREATININE 1.05 11/15/2017 0956   CREATININE 1.02 11/12/2014 1415      Component Value Date/Time   CALCIUM 9.6 11/15/2017 0956   CALCIUM 9.2 11/12/2014 1415   ALKPHOS 81 11/15/2017 0956   ALKPHOS 103 11/12/2014 1415   AST 18 11/15/2017 0956   AST 23 11/12/2014 1415   ALT 9 (L) 11/15/2017 0956   ALT 16 (L) 11/12/2014 1415   BILITOT 0.7 11/15/2017 0956   BILITOT 0.4 11/12/2014 1415      RADIOGRAPHIC STUDIES: I have personally reviewed the radiological images as listed and agreed with the findings in the report. No results found.  IMPRESSION: 1. Interval enlargement of what is now an 11 x 15 mm right lower lobe nodule, concerning for metastatic lesion. No other sites of new extra skeletal metastatic disease are identified on today's examination. 2. Similar appearance of widespread osseous metastatic disease compared to the prior examination. 3. Aortic atherosclerosis, in addition to left anterior descending coronary artery disease. Please note that although the presence of coronary artery calcium documents the presence of coronary artery disease, the severity of this disease and any potential stenosis cannot be assessed on this non-gated CT examination. Assessment for potential risk factor modification, dietary therapy or pharmacologic therapy may be warranted, if clinically indicated. 4. There are calcifications of the aortic valve.  Echocardiographic correlation for evaluation of potential valvular dysfunction may be warranted if clinically indicated. 5. 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 Attention on routine followup examinations is recommended.  Aortic Atherosclerosis (ICD10-I70.0). Aortic aneurysm NOS (ICD10-I71.9).   Electronically Signed   By: Vinnie Langton M.D.   On: 08/22/2017 14:35  Results for RAFE, MACKOWSKI (MRN [id number]) as of 11/15/2017 11:01  Ref. Range 05/09/2017 10:45 06/06/2017 10:14 07/04/2017 09:44 08/01/2017 09:18 08/22/2017 11:15  Prostatic Specific Antigen Latest Ref Range: 0.00 - 4.00 ng/mL 9.74 (H) 10.42 (H) 12.09 (H) 12.87 (H) 11.65 (H)     ASSESSMENT & PLAN:  Prostate cancer metastatic to multiple sites [facility]) Metastatic prostate cancer castrate resistant to the bone- currently on Trelstar q 6 m [last feb 1st 2019]. Feb 2019- CT scan C/A/P- Stable  lymphadenopathy in the pelvis and retroperitoneum; bone scan-stable. 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  # #Right lower lobe~72m-status post biopsy-positive for fungal infection.  On Diflucan 400 mg/day x3 months [will finish in June 2019]; will again repeat CT scan in approximately July 2019.  # Hot flashes- on ditropan; hot falshes better  # Swelling rash/ Bil LE- ? Etiology-Diflucan for/Ditropan new medications unlikely; recommend elevation of legs at night.  If worse recommend compression stockings.   # Bone lesions continue Xgeva; no side effects noted. Continue calcium and vitamin D.   # follow up in month/x-geva; psa; cbc/cmp; discussed with clinical trials nurse KZigmund Daniel    No orders of the defined types were placed in this encounter.  All  questions were answered. The patient knows to call the clinic with any problems, questions or concerns.      GCammie Sickle MD 11/15/2017 2:59 PM

## 2017-11-15 NOTE — Assessment & Plan Note (Addendum)
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 Feb 2019- CT scan C/A/P- Stable  lymphadenopathy in the pelvis and retroperitoneum; bone scan-stable. 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  # #Right lower lobe~26m-status post biopsy-positive for fungal infection.  On Diflucan 400 mg/day x3 months [will finish in June 2019]; will again repeat CT scan in approximately July 2019.  # Hot flashes- on ditropan; hot falshes better  # Swelling rash/ Bil LE- ? Etiology-Diflucan for/Ditropan new medications unlikely; recommend elevation of legs at night.  If worse recommend compression stockings.   # Bone lesions continue Xgeva; no side effects noted. Continue calcium and vitamin D.   # follow up in month/x-geva; psa; cbc/cmp; discussed with clinical trials nurse KZigmund Daniel

## 2017-11-28 ENCOUNTER — Telehealth: Payer: Self-pay | Admitting: Genetics

## 2017-11-28 NOTE — Telephone Encounter (Signed)
Revealed genetic test results.  The BRCA2 mutation that was found in William Wright tumor was NOT detected in William Wright germline testing.   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  This is a recessive colon polyp condition that significantly increases a person's risk for colon polyps and colon cancer if a person has both copies of MUTYH mutated.  William Wright only has 1 mutation therefore his colon cancer risk is only slightly elevated if at (no increased colon screening recommend with a negative family history).  However, it is possible for relatives to have inherited 2 MUTYH mutations.  For example, his children have a 50 % chance to inherit William Wright MUTYH mutation, but if his children's mother also carries a MUTYH mutation, his children could be at risk to have 2 MUTY mutations and be at significantly increased colon cancer risk.  We recommend all of William Wright relatives have genetic testing to determine if they carrier the familial MUTYH mutation an/or a 2nd MUTYH mutation.    This results does not explain his personal history of prostate cancer.  This result indicates it is unlikely William Wright cancer is due to a hereditary cause.  It is unlikely that there is an increased risk of another cancer due to a mutation in one of these genes.  However, genetic testing is not perfect, and cannot definitively rule out a hereditary cause.  It will be important for him to keep in contact with genetics to learn if any additional testing may be needed in the future.

## 2017-11-30 ENCOUNTER — Encounter: Payer: Self-pay | Admitting: Genetics

## 2017-11-30 ENCOUNTER — Ambulatory Visit: Payer: Self-pay | Admitting: Genetics

## 2017-11-30 DIAGNOSIS — C61 Malignant neoplasm of prostate: Secondary | ICD-10-CM

## 2017-11-30 DIAGNOSIS — Z806 Family history of leukemia: Secondary | ICD-10-CM

## 2017-11-30 DIAGNOSIS — Z1379 Encounter for other screening for genetic and chromosomal anomalies: Secondary | ICD-10-CM

## 2017-11-30 NOTE — Progress Notes (Signed)
HPI: William Wright was previously seen in the [place] clinic on 11/08/2017 due to a personal history of metastatic prostate cancer, and concerns regarding a hereditary predisposition to cancer. Please refer to our prior cancer genetics clinic note for more information regarding William Wright medical, social and family histories, and our assessment and recommendations, at the time. William Wright recent genetic test results were disclosed to her, as were recommendations warranted by these results. These results and recommendations are discussed in more detail below.   FAMILY HISTORY:  We obtained a detailed, 4-generation family history.  Significant diagnoses are listed below: Family History  Problem Relation Age of Onset  . Alzheimer's disease Father   . Lung cancer Maternal Grandfather 56  . Leukemia Paternal Grandmother 75  . COPD Paternal Grandfather   . Cancer Maternal Uncle        type unk, dx 30's    William Wright has a 79 year-old daughter and a 63 year-old son with no history of cancer.  He has 3 grandchildren.  William Wright has 2 sisters age 38 and 38 with no history of cancer.  He does not know if they still have their uterus or ovaries intact.   William Wright father: died at 82, had Alzheimer's disease.  Paternal Aunts/Uncles: 2 maternal uncles died as children (1 due to pneumonia, and the other in a accident).  1 paternal aunt died in her 33's/70's with no history of cancer.  Paternal cousins: 2 cousins, no history of cancer.  Paternal grandfather: COPD/ Emphysema Paternal grandmother:leukemia dx in 66's  William Wright mother: 71, no history of cancer.  William Wright believes she had a hysterectomy. Maternal Aunts/Uncles: 1 maternal uncle is in his 48's and has cancer- type unk Maternal cousins: 1 maternal male cousin, no history of cancer. Maternal grandfather: died of lung cancer at 68 Maternal grandmother:died at 61, no history of cancer.   Mr.  Wright is unaware of previous family history of genetic testing for hereditary cancer risks. Patient's maternal ancestors are of Caucasian descent, and paternal ancestors are of Scottish/Irish descent. There is no reported Ashkenazi Jewish ancestry. There is no known consanguinity.  GENETIC TEST RESULTS: At the time of William Wright's visit, we recommended he pursue genetic testing of the Common Hereditary Cancers Panel. The Common Hereditary Cancer Panel offered by [facility] includes sequencing and/or deletion duplication testing of the following 47 genes: APC, ATM, AXIN2, BARD1, BMPR1A, BRCA1, BRCA2, BRIP1, CDH1, CDKN2A (p14ARF), CDKN2A (p16INK4a), CKD4, CHEK2, CTNNA1, DICER1, EPCAM (Deletion/duplication testing only), GREM1 (promoter region deletion/duplication testing only), KIT, MEN1, MLH1, MSH2, MSH3, MSH6, MUTYH, NBN, NF1, NHTL1, PALB2, PDGFRA, PMS2, POLD1, POLE, PTEN, RAD50, RAD51C, RAD51D, SDHB, SDHC, SDHD, SMAD4, SMARCA4. STK11, TP53, TSC1, TSC2, and VHL.  The following genes were evaluated for sequence changes only: SDHA and HOXB13 c.251G>A variant only.  This test was performed at [facility]. . Genetic testing identified a single, heterozygous pathogenic gene mutation called MUTYH.  c.1187G>A (p.Gly396Asp)    We discussed with William Wright that because current genetic testing is not perfect, it is possible there may be a gene mutation in one of these genes that current testing cannot detect, but that chance is small. We also discussed, that there could be another gene that has not yet been discovered, or that we have not yet tested, that is responsible for the cancer diagnoses in the family. It is also possible there is a hereditary cause for the cancer in the family that William Wright did  not inherit and therefore was not identified in her testing.  Therefore, it is important to remain in touch with cancer genetics in the future so that we can continue to offer William Wright the most up  to date genetic testing.   ADDITIONAL GENETIC TESTING: We discussed with William Wright  that there are other genes that are associated with increased cancer risk that can be analyzed. The laboratories that offer this testing look at these additional genes via a hereditary cancer gene panel. Should William Wright wish to pursue additional genetic testing, we are happy to discuss and coordinate this testing, at any time.    MUTYH: MUTYH- Associated Polyposis is an autosomal recessivecondition that increases the risk to develop multiple colon/duodenal polyps and increases cancer risk. We reviewedrecessive inheritance and discussed when an individual has two MUTYHpathogenicmutations, this is associated with an increased risk for adenomatous (precancerous) colon polyps.   Based oncurrent data,the risk for colon cancer/polypsin individuals who are MUTYH heterozygotes (only 1 mutation) ismoderately increased at most. Only OneMUTYH pathogenic variant was detected in Mr. Naval. While this is reassuring that there is no significant increase in colon cancer risk, there is always a small chance that genetic testing did not detect all possible variants, as the technology is constantly evolving.   The BRCA2 mutation previously identified on Mr. Morua tumor testing was NOT DETECTED in his germline testing.   This result does not explain his personal history of prostate cancer, and no other mutations were identified in any of the other genes analyzed on this panel.  This result indicates that it is unlikely Mr. Brandt prostate cancer was due to a hereditary cancer predisposition syndrome.  Most cancers happen by chance and this negative test suggests that his cancer may fall into this category.  It is recommended he continue to follow the cancer management and screening guidelines provided by his oncology and primary healthcare providers. Other factors such as her personal and family history may  still affect his cancer risk  SCREENING RECOMMENDATIONS: We recommendedMr.Holquin follow management guidelines for heterozygous MUTYH mutations; all of which are outlined below. These are from the NCCN 1.2018 guidelines, and are subject to change.  MUTYH heterozygote with nopersonal history of colon cancer and No family history of colon cancer: Data are uncertain if specialized screening is warranted.   It is important for Mr. Shafer to follow general population colon screening guidelines, and any recommendations provided to him by his GI or other healthcare providers.   RECOMMENDATIONS FOR FAMILY MEMBERS:  We recommend all of Mr. Farrelly family members have genetic counseling and genetic testing. BecausetheMUTYH pathogenic varianthas been identifiedinMr.Heath Lark, we canoffer genetic testing torelatives to determineif they also carry the MUTYH familial mutation. As discussed above, having only 1 MUTYH mutation would not significantly impact their colon cancer risk/screening. However, if any relatives have inherited 2 MUTYH mutations (in trans), this would significantly impact their cancer risk. About 1-2% of the [place] population carries a single MUTYH pathogenic variant, so there is a chance some relatives may have inherited MUTYH mutations from both of their parents. We would be happy to meet with any of the family members or refer them to a genetic counselor in their local area. To locate genetic counselors in other cities, individuals can visit the website of the [employer] (ArtistMovie.se) and Secretary/administrator for a Social worker by zip code.  Relatives in this family might be at some increased risk of developing cancer, over the general population risk,  simply due to the family history of cancer. We recommended women in this family have a yearly mammogram beginning at age 7, or 64 years younger than the earliest onset of cancer, an annual  clinical breast exam, and perform monthly breast self-exams. Women in this family should also have a gynecological exam as recommended by their primary provider. All family members should have a colonoscopy by age 64 (or as directed by their 80).  All family members should inform their physicians about the family history of cancer so their doctors can make the most appropriate screening recommendations for them.   FOLLOW-UP: Lastly, we discussed with Mr. Ostermiller that cancer genetics is a rapidly advancing field and it is possible that new genetic tests will be appropriate for him and/or her family members in the future. We encouraged him to remain in contact with cancer genetics on an annual basis so we can update his personal and family histories and let her know of advances in cancer genetics that may benefit this family.   Our contact number was provided. Mr. Crotteau questions were answered to his satisfaction, and he knows he is welcome to call us at anytime with additional questions or concerns.   Ferol Luz, MS, [facility] Certified Genetic Counselor [email or web address]

## 2017-12-13 ENCOUNTER — Inpatient Hospital Stay (HOSPITAL_BASED_OUTPATIENT_CLINIC_OR_DEPARTMENT_OTHER): Payer: Medicare Other | Admitting: Internal Medicine

## 2017-12-13 ENCOUNTER — Inpatient Hospital Stay: Payer: Medicare Other

## 2017-12-13 VITALS — BP 127/76 | HR 56 | Temp 98.1°F | Resp 16 | Wt 161.0 lb

## 2017-12-13 DIAGNOSIS — R232 Flushing: Secondary | ICD-10-CM | POA: Diagnosis not present

## 2017-12-13 DIAGNOSIS — C61 Malignant neoplasm of prostate: Secondary | ICD-10-CM

## 2017-12-13 DIAGNOSIS — R600 Localized edema: Secondary | ICD-10-CM | POA: Diagnosis not present

## 2017-12-13 DIAGNOSIS — R911 Solitary pulmonary nodule: Secondary | ICD-10-CM

## 2017-12-13 DIAGNOSIS — Z79899 Other long term (current) drug therapy: Secondary | ICD-10-CM | POA: Diagnosis not present

## 2017-12-13 DIAGNOSIS — Z801 Family history of malignant neoplasm of trachea, bronchus and lung: Secondary | ICD-10-CM

## 2017-12-13 DIAGNOSIS — Z806 Family history of leukemia: Secondary | ICD-10-CM | POA: Diagnosis not present

## 2017-12-13 DIAGNOSIS — R21 Rash and other nonspecific skin eruption: Secondary | ICD-10-CM | POA: Diagnosis not present

## 2017-12-13 DIAGNOSIS — C7951 Secondary malignant neoplasm of bone: Secondary | ICD-10-CM

## 2017-12-13 DIAGNOSIS — I2510 Atherosclerotic heart disease of native coronary artery without angina pectoris: Secondary | ICD-10-CM

## 2017-12-13 DIAGNOSIS — Z79818 Long term (current) use of other agents affecting estrogen receptors and estrogen levels: Secondary | ICD-10-CM

## 2017-12-13 DIAGNOSIS — Z8042 Family history of malignant neoplasm of prostate: Secondary | ICD-10-CM

## 2017-12-13 DIAGNOSIS — M549 Dorsalgia, unspecified: Secondary | ICD-10-CM | POA: Diagnosis not present

## 2017-12-13 LAB — CBC WITH DIFFERENTIAL/PLATELET
BASOS ABS: 0 10*3/uL (ref 0–0.1)
BASOS PCT: 1 %
EOS PCT: 3 %
Eosinophils Absolute: 0.1 10*3/uL (ref 0–0.7)
HEMATOCRIT: 37.3 % — AB (ref 40.0–52.0)
Hemoglobin: 12.9 g/dL — ABNORMAL LOW (ref 13.0–18.0)
Lymphocytes Relative: 33 %
Lymphs Abs: 1.2 10*3/uL (ref 1.0–3.6)
MCH: 33.2 pg (ref 26.0–34.0)
MCHC: 34.6 g/dL (ref 32.0–36.0)
MCV: 96.1 fL (ref 80.0–100.0)
MONO ABS: 0.6 10*3/uL (ref 0.2–1.0)
Monocytes Relative: 15 %
NEUTROS ABS: 1.8 10*3/uL (ref 1.4–6.5)
Neutrophils Relative %: 48 %
Platelets: 204 10*3/uL (ref 150–440)
RBC: 3.88 MIL/uL — ABNORMAL LOW (ref 4.40–5.90)
RDW: 12.9 % (ref 11.5–14.5)
WBC: 3.8 10*3/uL (ref 3.8–10.6)

## 2017-12-13 LAB — COMPREHENSIVE METABOLIC PANEL
ALBUMIN: 4 g/dL (ref 3.5–5.0)
ALK PHOS: 67 U/L (ref 38–126)
ALT: 9 U/L — AB (ref 17–63)
ANION GAP: 9 (ref 5–15)
AST: 18 U/L (ref 15–41)
BILIRUBIN TOTAL: 0.6 mg/dL (ref 0.3–1.2)
BUN: 16 mg/dL (ref 6–20)
CALCIUM: 9 mg/dL (ref 8.9–10.3)
CO2: 24 mmol/L (ref 22–32)
CREATININE: 0.96 mg/dL (ref 0.61–1.24)
Chloride: 102 mmol/L (ref 101–111)
GFR calc Af Amer: >60 mL/min (ref >60)
GFR calc non Af Amer: >60 mL/min (ref >60)
GLUCOSE: 103 mg/dL — AB (ref 65–99)
Potassium: 4.3 mmol/L (ref 3.5–5.1)
SODIUM: 135 mmol/L (ref 135–145)
TOTAL PROTEIN: 6.7 g/dL (ref 6.5–8.1)

## 2017-12-13 LAB — PSA: Prostatic Specific Antigen: 11.69 ng/mL — ABNORMAL HIGH (ref 0.00–4.00)

## 2017-12-13 MED ORDER — DENOSUMAB 120 MG/1.7ML ~~LOC~~ SOLN
120.0000 mg | Freq: Once | SUBCUTANEOUS | Status: AC
  Administered 2017-12-13: 120 mg via SUBCUTANEOUS
  Filled 2017-12-13: qty 1.7

## 2017-12-13 NOTE — Assessment & Plan Note (Addendum)
#  castrate resistant prostate cancer-stage IV; on Trelstar; Xtandi.  Currently stable.  #Right lower lobe/fungal infection currently on Diflucan.  Awaiting repeat evaluation with Dr. Ola Spurr.  We will plan to get a CT scan in July.  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  Reviewed the recommendations of genetic counselor with the patient; when talking to family regarding genetic testing.  # Hot flashes on Ditropan improved.  # Swelling rash/ Bil LE-stable not resolved.  Awaiting evaluation with Dr. Lucky Cowboy.  #Bone lesions from prostate cancer stable on Xgeva.   # follow up cbc/cmp/psa June 28th- x-geva. trelstar- due on aug 7th.

## 2017-12-13 NOTE — Progress Notes (Signed)
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- ZYTIGA plus minus XTANDI; June 28th-CT-C/A/P- Stable Retrocrural/RP/Pelvic LN;Bone scan- Stable sclerotic lesions  # MARCH 2019- Taken off trial [ based on interm analysis]; continue X-tandi [given not needing steroids]   # RLL [~10mm lung nodule] s/p Bx- Cryptococcus- ID/Dr.Fitzgerald- on diflucan   # Xgeva  # MOLECULAR TESTING/omniseq- BRCA-2 MUTATED; PDL-1- NEG; MSS; N-TRK-NEG  # GENETICS- BRCA-NEG; hetrozygous MUTHY; recommended family work up --------------------------------------------------    DIAGNOSIS: [ ] Metastatic prostate cancer  STAGE: 4    ;GOALS: Palliative  CURRENT/MOST RECENT THERAPY-X-tandi+ Trelstar      Cancer of prostate (HCC)   08/17/2012 Initial Diagnosis    Cancer of prostate, stage II      12/03/2013 Progression         10/13/2014 Progression          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  male pleasant patient above history of metastatic prostate cancer castrate resistant is here for follow-up.  Patient continues to complain of mild rash in his legs/swelling of his ankles.  He is awaiting evaluation with vascular surgery.  Intermittent back pain.  Otherwise not any worse.  Appetite is good.  No cough or shortness of breath.  Review of Systems  Constitutional: Negative for chills, diaphoresis, fever, malaise/fatigue and weight loss.  HENT:  Negative for nosebleeds and sore throat.   Eyes: Negative for double vision.  Respiratory: Negative for cough, hemoptysis, sputum production, shortness of breath and wheezing.   Cardiovascular: Positive for leg swelling. Negative for chest pain, palpitations and orthopnea.  Gastrointestinal: Negative for abdominal pain, blood in stool, constipation, diarrhea, heartburn, melena, nausea and vomiting.  Genitourinary: Negative for dysuria, frequency and urgency.  Musculoskeletal: Positive for back pain. Negative for joint pain.  Skin: Positive for rash. Negative for itching.  Neurological: Negative for dizziness, tingling, focal weakness, weakness and headaches.  Endo/Heme/Allergies: Does not bruise/bleed easily.  Psychiatric/Behavioral: Negative for depression. The patient is not nervous/anxious and does not have insomnia.       PAST MEDICAL HISTORY :  Past Medical History:  Diagnosis Date  . Cancer of prostate (HCC) 07/2012  . Family history of leukemia   . Prostate cancer (HCC)     PAST SURGICAL HISTORY :   Past Surgical History:  Procedure Laterality Date  . EXTRACORPOREAL SHOCK WAVE LITHOTRIPSY Left 05/20/2015   Procedure: EXTRACORPOREAL SHOCK WAVE LITHOTRIPSY (ESWL);  Surgeon: Michael R Wolff, MD;  Location: [facility] ORS;  Service: Urology;  Laterality: Left;  . EXTRACORPOREAL SHOCK WAVE LITHOTRIPSY Right 12/21/2016   Procedure: EXTRACORPOREAL SHOCK WAVE LITHOTRIPSY (ESWL);  Surgeon: Wolff, Michael R, MD;  Location: [facility] ORS;  Service: Urology;  Laterality: Right;    FAMILY HISTORY :   Family History  Problem Relation Age of Onset  . Alzheimer's disease Father   . Lung cancer Maternal Grandfather 61  . Leukemia Paternal Grandmother 75  . COPD Paternal Grandfather   .   Cancer Maternal Uncle        type unk, dx 80's    SOCIAL HISTORY:   Social History   Tobacco Use  . Smoking status: Never Smoker  . Smokeless tobacco: Never Used  Substance Use Topics  . Alcohol use: No  .  Drug use: No    ALLERGIES:  is allergic to no known allergies.  MEDICATIONS:  Current Outpatient Medications  Medication Sig Dispense Refill  . enzalutamide (XTANDI) 40 MG capsule Take 4 capsules (160 mg total) by mouth daily. 120 capsule 6  . NUCYNTA 50 MG tablet Take 1 tablet (50 mg total) by mouth every 6 (six) hours as needed for moderate pain. 1 TO 2 TABS Q 6 HOURS PRN PAIN 30 tablet 0  . oxybutynin (DITROPAN) 5 MG tablet Take 5 mg by mouth daily.    . tamsulosin (FLOMAX) 0.4 MG CAPS capsule Take 1 capsule (0.4 mg total) by mouth daily. 30 capsule 11  . Triptorelin Pamoate (TRELSTAR) 22.5 MG injection Inject 22.5 mg into the muscle every 6 (six) months.    . valACYclovir (VALTREX) 1000 MG tablet Take 1 tablet (1,000 mg total) by mouth 2 (two) times daily. 60 tablet 6  . venlafaxine XR (EFFEXOR-XR) 37.5 MG 24 hr capsule Take 1 capsule (37.5 mg total) by mouth daily with breakfast. 30 capsule 11   No current facility-administered medications for this visit.     PHYSICAL EXAMINATION: ECOG PERFORMANCE STATUS: 0 - Asymptomatic  BP 127/76 (BP Location: Left Arm, Patient Position: Sitting)   Pulse (!) 56   Temp 98.1 F (36.7 C) (Tympanic)   Resp 16   Wt 161 lb (73 kg)   BMI 21.84 kg/m   Filed Weights   12/13/17 1026  Weight: 161 lb (73 kg)    GENERAL: Well-nourished well-developed; Alert, no distress and comfortable.  Alone.  EYES: no pallor or icterus OROPHARYNX: no thrush or ulceration; NECK: supple; no lymph nodes felt. LYMPH:  no palpable lymphadenopathy in the axillary or inguinal regions LUNGS: Decreased breath sounds auscultation bilaterally. No wheeze or crackles HEART/CVS: regular rate & rhythm and no murmurs; No lower extremity edema ABDOMEN:abdomen soft, non-tender and normal bowel sounds. No hepatomegaly or splenomegaly.  Musculoskeletal:no cyanosis of digits and no clubbing  PSYCH: alert & oriented x 3 with fluent speech NEURO: no focal motor/sensory  deficits SKIN:  no rashes or significant lesions    LABORATORY DATA:  I have reviewed the data as listed    Component Value Date/Time   NA 135 12/13/2017 1007   NA 137 11/12/2014 1415   K 4.3 12/13/2017 1007   K 4.1 11/12/2014 1415   CL 102 12/13/2017 1007   CL 105 11/12/2014 1415   CO2 24 12/13/2017 1007   CO2 24 11/12/2014 1415   GLUCOSE 103 (H) 12/13/2017 1007   GLUCOSE 98 11/12/2014 1415   BUN 16 12/13/2017 1007   BUN 24 (H) 11/12/2014 1415   CREATININE 0.96 12/13/2017 1007   CREATININE 1.02 11/12/2014 1415   CALCIUM 9.0 12/13/2017 1007   CALCIUM 9.2 11/12/2014 1415   PROT 6.7 12/13/2017 1007   PROT 7.4 11/12/2014 1415   ALBUMIN 4.0 12/13/2017 1007   ALBUMIN 4.4 11/12/2014 1415   AST 18 12/13/2017 1007   AST 23 11/12/2014 1415   ALT 9 (L) 12/13/2017 1007   ALT 16 (L) 11/12/2014 1415   ALKPHOS 67 12/13/2017 1007   ALKPHOS 103 11/12/2014 1415   BILITOT 0.6 12/13/2017 1007   BILITOT 0.4 11/12/2014  [place] 12/13/2017 1007   GFRNONAA >60 11/12/2014 1415   GFRAA >60 12/13/2017 1007   GFRAA >60 11/12/2014 1415    No results found for: SPEP, UPEP  Lab Results  Component Value Date   WBC 3.8 12/13/2017   NEUTROABS 1.8 12/13/2017   HGB 12.9 (L) 12/13/2017   HCT 37.3 (L) 12/13/2017   MCV 96.1 12/13/2017   PLT 204 12/13/2017      Chemistry      Component Value Date/Time   NA 135 12/13/2017 1007   NA 137 11/12/2014 1415   K 4.3 12/13/2017 1007   K 4.1 11/12/2014 1415   CL 102 12/13/2017 1007   CL 105 11/12/2014 1415   CO2 24 12/13/2017 1007   CO2 24 11/12/2014 1415   BUN 16 12/13/2017 1007   BUN 24 (H) 11/12/2014 1415   CREATININE 0.96 12/13/2017 1007   CREATININE 1.02 11/12/2014 1415      Component Value Date/Time   CALCIUM 9.0 12/13/2017 1007   CALCIUM 9.2 11/12/2014 1415   ALKPHOS 67 12/13/2017 1007   ALKPHOS 103 11/12/2014 1415   AST 18 12/13/2017 1007   AST 23 11/12/2014 1415   ALT 9 (L) 12/13/2017 1007   ALT 16 (L) 11/12/2014  1415   BILITOT 0.6 12/13/2017 1007   BILITOT 0.4 11/12/2014 1415       RADIOGRAPHIC STUDIES: I have personally reviewed the radiological images as listed and agreed with the findings in the report. No results found.   ASSESSMENT & PLAN:  Prostate cancer metastatic to multiple sites [facility]) #  castrate resistant prostate cancer-stage IV; on Trelstar; Xtandi.  Currently stable.  #Right lower lobe/fungal infection currently on Diflucan.  Awaiting repeat evaluation with Dr. Ola Spurr.  We will plan to get a CT scan in July.  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  Reviewed the recommendations of genetic counselor with the patient; when talking to family regarding genetic testing.  # Hot flashes on Ditropan improved.  # Swelling rash/ Bil LE-stable not resolved.  Awaiting evaluation with Dr. Lucky Cowboy.  #Bone lesions from prostate cancer stable on Xgeva.   # follow up cbc/cmp/psa June 28th- x-geva. trelstar- due on aug 7th.    Orders Placed This Encounter  Procedures  . CBC with Differential/Platelet    Standing Status:   Future    Number of Occurrences:   1    Standing Expiration Date:   12/14/2018  . Comprehensive metabolic panel    Standing Status:   Future    Number of Occurrences:   1    Standing Expiration Date:   12/14/2018  . PSA    Standing Status:   Future    Number of Occurrences:   1    Standing Expiration Date:   12/14/2018  . CBC with Differential    Standing Status:   Future    Standing Expiration Date:   12/14/2018  . Comprehensive metabolic panel    Standing Status:   Future    Standing Expiration Date:   12/14/2018  . PSA    Standing Status:   Future    Standing Expiration Date:   12/14/2018   All questions were answered. The patient knows to call the clinic with any problems, questions or concerns.      Cammie Sickle, MD 12/13/2017 5:49 PM

## 2017-12-21 ENCOUNTER — Ambulatory Visit (INDEPENDENT_AMBULATORY_CARE_PROVIDER_SITE_OTHER): Payer: Medicare Other | Admitting: Vascular Surgery

## 2017-12-21 ENCOUNTER — Encounter (INDEPENDENT_AMBULATORY_CARE_PROVIDER_SITE_OTHER): Payer: Self-pay | Admitting: Vascular Surgery

## 2017-12-21 VITALS — BP 132/75 | HR 57 | Resp 13 | Ht 72.0 in | Wt 155.0 lb

## 2017-12-21 DIAGNOSIS — I712 Thoracic aortic aneurysm, without rupture, unspecified: Secondary | ICD-10-CM

## 2017-12-21 DIAGNOSIS — C61 Malignant neoplasm of prostate: Secondary | ICD-10-CM | POA: Diagnosis not present

## 2017-12-21 DIAGNOSIS — M7989 Other specified soft tissue disorders: Secondary | ICD-10-CM | POA: Diagnosis not present

## 2017-12-21 NOTE — Assessment & Plan Note (Signed)
His ascending thoracic aortic aneurysm measures 4.7 cm in maximal diameter which is slightly increased from his study one year ago. Well below the threshold from needing to be repaired.  Would recommend seeing him again in 1 year.  He will be getting regular CT scans and we can follow-up on these.

## 2017-12-21 NOTE — Assessment & Plan Note (Signed)
stable and doing fairly well.  We discussed compression stockings and elevation if it worsens.

## 2017-12-21 NOTE — Assessment & Plan Note (Signed)
Under the care of oncology.  Gets regular CT scans which will also follow his thoracic aneurysm.

## 2017-12-21 NOTE — Progress Notes (Signed)
MRN : [id number]  William Wright is a 67 y.o. (Sep 08, 1950) male who presents with chief complaint of  Chief Complaint  Patient presents with  . Follow-up    1 year no studies  .  History of Present Illness: Patient returns today in follow up of a thoracic aortic aneurysm and leg swelling.  He has been getting treatment for malignancy and has had follow-up CT scans the most recent was a couple of months ago which I have independently reviewed.  His ascending thoracic aortic aneurysm measures 4.7 cm in maximal diameter which is slightly increased from his study one year ago. He also reports that his leg swelling is reasonably good right now.  He has had several episodes where it has swelled up but has largely improved.  He does have some stasis changes.  Drinking more water and staying active have helped the swelling.  Current Outpatient Medications  Medication Sig Dispense Refill  . enzalutamide (XTANDI) 40 MG capsule Take 4 capsules (160 mg total) by mouth daily. 120 capsule 6  . oxybutynin (DITROPAN-XL) 5 MG 24 hr tablet     . Triptorelin Pamoate (TRELSTAR) 22.5 MG injection Inject 22.5 mg into the muscle every 6 (six) months.    . valACYclovir (VALTREX) 1000 MG tablet Take 1 tablet (1,000 mg total) by mouth 2 (two) times daily. 60 tablet 6  . NUCYNTA 50 MG tablet Take 1 tablet (50 mg total) by mouth every 6 (six) hours as needed for moderate pain. 1 TO 2 TABS Q 6 HOURS PRN PAIN (Patient not taking: Reported on 12/21/2017) 30 tablet 0  . predniSONE (DELTASONE) 5 MG tablet     . tamsulosin (FLOMAX) 0.4 MG CAPS capsule Take 1 capsule (0.4 mg total) by mouth daily. (Patient not taking: Reported on 12/21/2017) 30 capsule 11  . venlafaxine XR (EFFEXOR-XR) 37.5 MG 24 hr capsule Take 1 capsule (37.5 mg total) by mouth daily with breakfast. (Patient not taking: Reported on 12/21/2017) 30 capsule 11   No current facility-administered medications for this visit.     Past Medical History:  Diagnosis  Date  . Cancer of prostate ([place]) 07/2012  . Family history of leukemia   . Prostate cancer [facility])     Past Surgical History:  Procedure Laterality Date  . EXTRACORPOREAL SHOCK WAVE LITHOTRIPSY Left 05/20/2015   Procedure: EXTRACORPOREAL SHOCK WAVE LITHOTRIPSY (ESWL);  Surgeon: Royston Cowper, MD;  Location: [facility] ORS;  Service: Urology;  Laterality: Left;  . EXTRACORPOREAL SHOCK WAVE LITHOTRIPSY Right 12/21/2016   Procedure: EXTRACORPOREAL SHOCK WAVE LITHOTRIPSY (ESWL);  Surgeon: Royston Cowper, MD;  Location: [facility] ORS;  Service: Urology;  Laterality: Right;    Social History Social History   Tobacco Use  . Smoking status: Never Smoker  . Smokeless tobacco: Never Used  Substance Use Topics  . Alcohol use: No  . Drug use: No    Family History Family History  Problem Relation Age of Onset  . Alzheimer's disease Father   . Lung cancer Maternal Grandfather 20  . Leukemia Paternal Grandmother 38  . COPD Paternal Grandfather   . Cancer Maternal Uncle        type unk, dx 80's     Allergies  Allergen Reactions  . No Known Allergies     REVIEW OF SYSTEMS (Negative unless checked)  Constitutional: _0 Weight loss  _1 Fever  _2 Chills Cardiac: _3 Chest pain   _4 Chest pressure   _5 Palpitations   _6 Shortness of breath when laying flat   _7 Shortness  of breath at rest   _0 Shortness of breath with exertion. Vascular:  _1 Pain in legs with walking   _2 Pain in legs at rest   _3 Pain in legs when laying flat   _4 Claudication   _5 Pain in feet when walking  _6 Pain in feet at rest  _7 Pain in feet when laying flat   _8 History of DVT   _9 Phlebitis   _10 Swelling in legs   _11 Varicose veins   _12 Non-healing ulcers Pulmonary:   _13 Uses home oxygen   _14 Productive cough   _15 Hemoptysis   _16 Wheeze  _17 COPD   _18 Asthma Neurologic:  _19 Dizziness  _20 Blackouts   _21 Seizures   _22 History of stroke   _23 History of TIA  _24 Aphasia   _25 Temporary blindness   _26 Dysphagia   _27 Weakness or numbness in arms   _28 Weakness or  numbness in legs Musculoskeletal:  _29 Arthritis   _30 Joint swelling   _31 Joint pain   _32 Low back pain Hematologic:  _33 Easy bruising  _34 Easy bleeding   _35 Hypercoagulable state   _36 Anemic  _37 Hepatitis Gastrointestinal:  _38 Blood in stool   _39 Vomiting blood  _40 Gastroesophageal reflux/heartburn   _41 Abdominal pain Genitourinary:  _42 Chronic kidney disease   _43 Difficult urination  _44 Frequent urination  _45 Burning with urination   _46 Hematuria Skin:  _47 Rashes   _48 Ulcers   _49 Wounds Psychological:  _50 History of anxiety   _51  History of major depression.    Physical Examination  BP 132/75 (BP Location: Right Arm, Patient Position: Sitting)   Pulse (!) 57   Resp 13   Ht 6' (1.829 m)   Wt 155 lb (70.3 kg)   BMI 21.02 kg/m  Gen:  WD/WN, NAD Head: [place]/AT, No temporalis wasting. Ear/Nose/Throat: Hearing grossly intact, nares w/o erythema or drainage Eyes: Conjunctiva clear. Sclera non-icteric Neck: Supple.  Trachea midline Pulmonary:  Good air movement, no use of accessory muscles.  Cardiac: RRR, no JVD Vascular:  Vessel Right Left  Radial Palpable Palpable                          PT Palpable Palpable  DP Palpable Palpable   Musculoskeletal: M/S 5/5 throughout.  No deformity or atrophy. Mild BLE edema. Moderate stasis dermatitis changes to the leg bilaterally Neurologic: Sensation grossly intact in extremities.  Symmetrical.  Speech is fluent.  Psychiatric: Judgment intact, Mood & affect appropriate for pt's clinical situation. Dermatologic: No rashes or ulcers noted.  No cellulitis or open wounds.       Labs Recent Results (from the past 2160 hour(s))  Comprehensive metabolic panel     Status: Abnormal   Collection Time: 10/18/17 10:30 AM  Result Value Ref Range   Sodium 142 135 - 145 mmol/L   Potassium 3.9 3.5 - 5.1 mmol/L   Chloride 109 101 - 111 mmol/L   CO2 25 22 - 32 mmol/L   Glucose, Bld 109 (H) 65 - 99 mg/dL   BUN 16 6 - 20 mg/dL   Creatinine, Ser 0.91 0.61 - 1.24 mg/dL     Calcium 9.2 8.9 - 10.3 mg/dL   Total Protein 6.9 6.5 - 8.1 g/dL   Albumin 4.0 3.5 - 5.0 g/dL   AST 17 15 - 41 U/L   ALT 11 (L) 17 - 63 U/L   Alkaline Phosphatase 80 38 - 126 U/L   Total Bilirubin 0.7 0.3 - 1.2 mg/dL   GFR calc non Af Amer >60 >60 mL/min   GFR calc Af Amer >60 >60 mL/min    Comment: (NOTE) The eGFR has been calculated using  the CKD EPI equation. This calculation has not been validated in all clinical situations. eGFR's persistently <60 mL/min signify possible Chronic Kidney Disease.    Anion gap 8 5 - 15    Comment: Performed at [facility], [address] [place], [place] [address]  CBC with Differential/Platelet     Status: Abnormal   Collection Time: 10/18/17 10:30 AM  Result Value Ref Range   WBC 3.8 3.8 - 10.6 K/uL   RBC 3.87 (L) 4.40 - 5.90 MIL/uL   Hemoglobin 13.0 13.0 - 18.0 g/dL   HCT 37.8 (L) 40.0 - 52.0 %   MCV 97.6 80.0 - 100.0 fL   MCH 33.7 26.0 - 34.0 pg   MCHC 34.5 32.0 - 36.0 g/dL   RDW 12.8 11.5 - 14.5 %   Platelets 199 150 - 440 K/uL   Neutrophils Relative % 59 %   Neutro Abs 2.2 1.4 - 6.5 K/uL   Lymphocytes Relative 27 %   Lymphs Abs 1.0 1.0 - 3.6 K/uL   Monocytes Relative 10 %   Monocytes Absolute 0.4 0.2 - 1.0 K/uL   Eosinophils Relative 3 %   Eosinophils Absolute 0.1 0 - 0.7 K/uL   Basophils Relative 1 %   Basophils Absolute 0.0 0 - 0.1 K/uL    Comment: Performed at [facility], [address] [place], [place] [address]  Comprehensive metabolic panel     Status: Abnormal   Collection Time: 11/15/17  9:56 AM  Result Value Ref Range   Sodium 139 135 - 145 mmol/L   Potassium 4.0 3.5 - 5.1 mmol/L   Chloride 104 101 - 111 mmol/L   CO2 24 22 - 32 mmol/L   Glucose, Bld 93 65 - 99 mg/dL   BUN 14 6 - 20 mg/dL   Creatinine, Ser 1.05 0.61 - 1.24 mg/dL   Calcium 9.6 8.9 - 10.3 mg/dL   Total Protein 7.2 6.5 - 8.1 g/dL   Albumin 4.2 3.5 - 5.0 g/dL   AST 18 15 - 41 U/L   ALT 9 (L) 17 - 63 U/L   Alkaline Phosphatase 81  38 - 126 U/L   Total Bilirubin 0.7 0.3 - 1.2 mg/dL   GFR calc non Af Amer >60 >60 mL/min   GFR calc Af Amer >60 >60 mL/min    Comment: (NOTE) The eGFR has been calculated using the CKD EPI equation. This calculation has not been validated in all clinical situations. eGFR's persistently <60 mL/min signify possible Chronic Kidney Disease.    Anion gap 11 5 - 15    Comment: Performed at [facility], [address] [place],  [address]  CBC with Differential/Platelet     Status: Abnormal   Collection Time: 11/15/17  9:56 AM  Result Value Ref Range   WBC 4.0 3.8 - 10.6 K/uL   RBC 4.20 (L) 4.40 - 5.90 MIL/uL   Hemoglobin 14.2 13.0 - 18.0 g/dL   HCT 40.3 40.0 - 52.0 %   MCV 96.0 80.0 - 100.0 fL   MCH 33.9 26.0 - 34.0 pg   MCHC 35.3 32.0 - 36.0 g/dL   RDW 12.9 11.5 - 14.5 %   Platelets 219 150 - 440 K/uL   Neutrophils Relative % 56 %   Neutro Abs 2.2 1.4 - 6.5 K/uL   Lymphocytes Relative 29 %   Lymphs Abs 1.1 1.0 - 3.6 K/uL   Monocytes Relative 11 %   Monocytes Absolute 0.4 0.2 - 1.0 K/uL  Eosinophils Relative 3 %   Eosinophils Absolute 0.1 0 - 0.7 K/uL   Basophils Relative 1 %   Basophils Absolute 0.0 0 - 0.1 K/uL    Comment: Performed at [facility], [address] [place], [place] [address]  PSA     Status: Abnormal   Collection Time: 11/15/17  9:56 AM  Result Value Ref Range   Prostatic Specific Antigen 12.51 (H) 0.00 - 4.00 ng/mL    Comment: (NOTE) While PSA levels of <=4.0 ng/ml are reported as reference range, some men with levels below 4.0 ng/ml can have prostate cancer and many men with PSA above 4.0 ng/ml do not have prostate cancer.  Other tests such as free PSA, age specific reference ranges, PSA velocity and PSA doubling time may be helpful especially in men less than 1 years old. Performed at [facility], [place] [address], [place], [place] [address]   PSA     Status: Abnormal   Collection Time: 12/13/17 10:07 AM  Result Value Ref  Range   Prostatic Specific Antigen 11.69 (H) 0.00 - 4.00 ng/mL    Comment: (NOTE) While PSA levels of <=4.0 ng/ml are reported as reference range, some men with levels below 4.0 ng/ml can have prostate cancer and many men with PSA above 4.0 ng/ml do not have prostate cancer.  Other tests such as free PSA, age specific reference ranges, PSA velocity and PSA doubling time may be helpful especially in men less than 68 years old. Performed at [facility], [place] [address] [place], [place] [address]   Comprehensive metabolic panel     Status: Abnormal   Collection Time: 12/13/17 10:07 AM  Result Value Ref Range   Sodium 135 135 - 145 mmol/L   Potassium 4.3 3.5 - 5.1 mmol/L   Chloride 102 101 - 111 mmol/L   CO2 24 22 - 32 mmol/L   Glucose, Bld 103 (H) 65 - 99 mg/dL   BUN 16 6 - 20 mg/dL   Creatinine, Ser 0.96 0.61 - 1.24 mg/dL   Calcium 9.0 8.9 - 10.3 mg/dL   Total Protein 6.7 6.5 - 8.1 g/dL   Albumin 4.0 3.5 - 5.0 g/dL   AST 18 15 - 41 U/L   ALT 9 (L) 17 - 63 U/L   Alkaline Phosphatase 67 38 - 126 U/L   Total Bilirubin 0.6 0.3 - 1.2 mg/dL   GFR calc non Af Amer >60 >60 mL/min   GFR calc Af Amer >60 >60 mL/min    Comment: (NOTE) The eGFR has been calculated using the CKD EPI equation. This calculation has not been validated in all clinical situations. eGFR's persistently <60 mL/min signify possible Chronic Kidney Disease.    Anion gap 9 5 - 15    Comment: Performed at [facility], [address] [place], [place] [address]  CBC with Differential/Platelet     Status: Abnormal   Collection Time: 12/13/17 10:07 AM  Result Value Ref Range   WBC 3.8 3.8 - 10.6 K/uL   RBC 3.88 (L) 4.40 - 5.90 MIL/uL   Hemoglobin 12.9 (L) 13.0 - 18.0 g/dL   HCT 37.3 (L) 40.0 - 52.0 %   MCV 96.1 80.0 - 100.0 fL   MCH 33.2 26.0 - 34.0 pg   MCHC 34.6 32.0 - 36.0 g/dL   RDW 12.9 11.5 - 14.5 %   Platelets 204 150 - 440 K/uL   Neutrophils Relative % 48 %   Neutro Abs 1.8  1.4 - 6.5 K/uL     Lymphocytes Relative 33 %   Lymphs Abs 1.2 1.0 - 3.6 K/uL   Monocytes Relative 15 %   Monocytes Absolute 0.6 0.2 - 1.0 K/uL   Eosinophils Relative 3 %   Eosinophils Absolute 0.1 0 - 0.7 K/uL   Basophils Relative 1 %   Basophils Absolute 0.0 0 - 0.1 K/uL    Comment: Performed at [facility], [address], [place], [place] [address]    Radiology No results found.  Assessment/Plan  Swelling of limb stable and doing fairly well.  We discussed compression stockings and elevation if it worsens.  Prostate cancer metastatic to multiple sites [facility]) Under the care of oncology.  Gets regular CT scans which will also follow his thoracic aneurysm.  Thoracic aortic aneurysm without rupture (HCC) His ascending thoracic aortic aneurysm measures 4.7 cm in maximal diameter which is slightly increased from his study one year ago. Well below the threshold from needing to be repaired.  Would recommend seeing him again in 1 year.  He will be getting regular CT scans and we can follow-up on these.    Leotis Pain, MD  12/21/2017 10:38 AM    This note was created with Dragon medical transcription system.  Any errors from dictation are purely unintentional

## 2017-12-25 DIAGNOSIS — C61 Malignant neoplasm of prostate: Secondary | ICD-10-CM | POA: Diagnosis not present

## 2017-12-25 DIAGNOSIS — B450 Pulmonary cryptococcosis: Secondary | ICD-10-CM | POA: Diagnosis not present

## 2018-01-09 ENCOUNTER — Other Ambulatory Visit: Payer: Self-pay | Admitting: Internal Medicine

## 2018-01-11 ENCOUNTER — Encounter: Payer: Self-pay | Admitting: Internal Medicine

## 2018-01-11 ENCOUNTER — Inpatient Hospital Stay: Payer: Medicare Other

## 2018-01-11 ENCOUNTER — Inpatient Hospital Stay (HOSPITAL_BASED_OUTPATIENT_CLINIC_OR_DEPARTMENT_OTHER): Payer: Medicare Other | Admitting: Internal Medicine

## 2018-01-11 ENCOUNTER — Inpatient Hospital Stay: Payer: Medicare Other | Attending: Internal Medicine

## 2018-01-11 DIAGNOSIS — C61 Malignant neoplasm of prostate: Secondary | ICD-10-CM | POA: Diagnosis not present

## 2018-01-11 DIAGNOSIS — R232 Flushing: Secondary | ICD-10-CM

## 2018-01-11 DIAGNOSIS — Z7952 Long term (current) use of systemic steroids: Secondary | ICD-10-CM | POA: Insufficient documentation

## 2018-01-11 DIAGNOSIS — Z79899 Other long term (current) drug therapy: Secondary | ICD-10-CM | POA: Diagnosis not present

## 2018-01-11 DIAGNOSIS — K5900 Constipation, unspecified: Secondary | ICD-10-CM

## 2018-01-11 DIAGNOSIS — R911 Solitary pulmonary nodule: Secondary | ICD-10-CM

## 2018-01-11 DIAGNOSIS — Z806 Family history of leukemia: Secondary | ICD-10-CM | POA: Insufficient documentation

## 2018-01-11 DIAGNOSIS — K649 Unspecified hemorrhoids: Secondary | ICD-10-CM | POA: Diagnosis not present

## 2018-01-11 DIAGNOSIS — C7951 Secondary malignant neoplasm of bone: Secondary | ICD-10-CM

## 2018-01-11 DIAGNOSIS — R600 Localized edema: Secondary | ICD-10-CM

## 2018-01-11 DIAGNOSIS — Z79818 Long term (current) use of other agents affecting estrogen receptors and estrogen levels: Secondary | ICD-10-CM | POA: Insufficient documentation

## 2018-01-11 DIAGNOSIS — Z801 Family history of malignant neoplasm of trachea, bronchus and lung: Secondary | ICD-10-CM

## 2018-01-11 LAB — CBC WITH DIFFERENTIAL/PLATELET
BASOS ABS: 0 10*3/uL (ref 0–0.1)
BASOS PCT: 1 %
Eosinophils Absolute: 0.2 10*3/uL (ref 0–0.7)
Eosinophils Relative: 6 %
HEMATOCRIT: 40.3 % (ref 40.0–52.0)
Hemoglobin: 13.9 g/dL (ref 13.0–18.0)
Lymphocytes Relative: 30 %
Lymphs Abs: 1.2 10*3/uL (ref 1.0–3.6)
MCH: 33.3 pg (ref 26.0–34.0)
MCHC: 34.5 g/dL (ref 32.0–36.0)
MCV: 96.6 fL (ref 80.0–100.0)
MONO ABS: 0.5 10*3/uL (ref 0.2–1.0)
Monocytes Relative: 12 %
NEUTROS ABS: 2 10*3/uL (ref 1.4–6.5)
NEUTROS PCT: 51 %
Platelets: 231 10*3/uL (ref 150–440)
RBC: 4.17 MIL/uL — ABNORMAL LOW (ref 4.40–5.90)
RDW: 14.2 % (ref 11.5–14.5)
WBC: 3.9 10*3/uL (ref 3.8–10.6)

## 2018-01-11 LAB — COMPREHENSIVE METABOLIC PANEL
ALT: 9 U/L (ref 0–44)
AST: 17 U/L (ref 15–41)
Albumin: 4.1 g/dL (ref 3.5–5.0)
Alkaline Phosphatase: 77 U/L (ref 38–126)
Anion gap: 7 (ref 5–15)
BUN: 20 mg/dL (ref 8–23)
CO2: 26 mmol/L (ref 22–32)
Calcium: 9.4 mg/dL (ref 8.9–10.3)
Chloride: 107 mmol/L (ref 98–111)
Creatinine, Ser: 0.96 mg/dL (ref 0.61–1.24)
GFR calc Af Amer: >60 mL/min (ref >60)
GFR calc non Af Amer: >60 mL/min (ref >60)
Glucose, Bld: 91 mg/dL (ref 70–99)
Potassium: 4.1 mmol/L (ref 3.5–5.1)
Sodium: 140 mmol/L (ref 135–145)
Total Bilirubin: 0.7 mg/dL (ref 0.3–1.2)
Total Protein: 6.8 g/dL (ref 6.5–8.1)

## 2018-01-11 LAB — PSA: Prostatic Specific Antigen: 13.25 ng/mL — ABNORMAL HIGH (ref 0.00–4.00)

## 2018-01-11 MED ORDER — OXYBUTYNIN CHLORIDE ER 5 MG PO TB24: 5.0000 mg | ORAL_TABLET | Freq: Every day | ORAL | 3 refills | Status: DC

## 2018-01-11 MED ORDER — DENOSUMAB 120 MG/1.7ML ~~LOC~~ SOLN
120.0000 mg | Freq: Once | SUBCUTANEOUS | Status: AC
  Administered 2018-01-11: 120 mg via SUBCUTANEOUS
  Filled 2018-01-11: qty 1.7

## 2018-01-11 NOTE — Progress Notes (Signed)
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- ZYTIGA plus minus XTANDI; June 28th-CT-C/A/P- Stable Retrocrural/RP/Pelvic LN;Bone scan- Stable sclerotic lesions  # MARCH 2019- Taken off trial [ based on interm analysis]; continue X-tandi [given not needing steroids]   # RLL [~64m lung nodule] s/p Bx- Cryptococcus- ID/Dr.Fitzgerald- on diflucan   # Xgeva  # MOLECULAR TESTING/omniseq- BRCA-2 MUTATED; PDL-1- NEG; MSS; N-TRK-NEG  # GENETICS- BRCA-NEG; hetrozygous MUTHY; recommended family work up --------------------------------------------------    DIAGNOSIS: _0  Metastatic prostate cancer  STAGE: 4    ;GOALS: Palliative  CURRENT/MOST RECENT THERAPY-X-tandi+ Trelstar      Cancer of prostate ([place]   08/17/2012 Initial Diagnosis    Cancer of prostate, stage II      12/03/2013 Progression         10/13/2014 Progression          Prostate cancer metastatic to multiple sites ([facility]      INTERVAL HISTORY:  William [id number].[id number].  male pleasant patient above history of prostate cancer is here for follow-up  In the interim patient was evaluated by ID for his lung fungal infection.  Patient status post Diflucan.  Patient also developed by vascular surgery for lower extremity swelling/pigmentation.  No new recommendation at this time.  Patient does complain of" hemorrhoids"; bleeding on wiping; with constipation.   Review of Systems  Constitutional: Negative for chills,  diaphoresis, fever, malaise/fatigue and weight loss.  HENT: Negative for nosebleeds and sore throat.   Eyes: Negative for double vision.  Respiratory: Negative for cough, hemoptysis, sputum production, shortness of breath and wheezing.   Cardiovascular: Negative for chest pain, palpitations, orthopnea and leg swelling.  Gastrointestinal: Positive for blood in stool. Negative for abdominal pain, constipation, diarrhea, heartburn, melena, nausea and vomiting.  Genitourinary: Negative for dysuria, frequency and urgency.  Musculoskeletal: Negative for back pain and joint pain.  Skin: Negative.  Negative for itching and rash.  Neurological: Negative for dizziness, tingling, focal weakness, weakness and headaches.  Endo/Heme/Allergies: Does not bruise/bleed easily.  Psychiatric/Behavioral: Negative for depression. The patient is not nervous/anxious and does not have insomnia.       PAST MEDICAL HISTORY :  Past Medical History:  Diagnosis Date  . Cancer of prostate ([place] 07/2012  . Family history of leukemia   . Prostate cancer ([place]     PAST SURGICAL HISTORY :   Past Surgical History:  Procedure Laterality Date  . EXTRACORPOREAL SHOCK WAVE LITHOTRIPSY Left 05/20/2015   Procedure: EXTRACORPOREAL SHOCK WAVE LITHOTRIPSY (ESWL);  Surgeon: MRoyston Cowper MD;  Location: [facility] ORS;  Service: Urology;  Laterality: Left;  . EXTRACORPOREAL SHOCK WAVE LITHOTRIPSY Right 12/21/2016   Procedure: EXTRACORPOREAL SHOCK WAVE LITHOTRIPSY (ESWL);  Surgeon: WRoyston Cowper MD;  Location: [facility] ORS;  Service: Urology;  Laterality: Right;    FAMILY HISTORY :   Family History  Problem Relation Age of Onset  . Alzheimer's disease Father   . Lung cancer Maternal Grandfather 646 . Leukemia Paternal Grandmother 750 .  COPD Paternal Grandfather   . Cancer Maternal Uncle        type unk, dx 80's    SOCIAL HISTORY:   Social History   Tobacco Use  . Smoking status: Never Smoker  . Smokeless tobacco: Never  Used  Substance Use Topics  . Alcohol use: No  . Drug use: No    ALLERGIES:  is allergic to no known allergies.  MEDICATIONS:  Current Outpatient Medications  Medication Sig Dispense Refill  . tamsulosin (FLOMAX) 0.4 MG CAPS capsule Take 1 capsule (0.4 mg total) by mouth daily. 30 capsule 11  . enzalutamide (XTANDI) 40 MG capsule Take 4 capsules (160 mg total) by mouth daily. 120 capsule 6  . NUCYNTA 50 MG tablet Take 1 tablet (50 mg total) by mouth every 6 (six) hours as needed for moderate pain. 1 TO 2 TABS Q 6 HOURS PRN PAIN (Patient not taking: Reported on 12/21/2017) 30 tablet 0  . oxybutynin (DITROPAN-XL) 5 MG 24 hr tablet Take 1 tablet (5 mg total) by mouth at bedtime. 90 tablet 3  . predniSONE (DELTASONE) 5 MG tablet     . Triptorelin Pamoate (TRELSTAR) 22.5 MG injection Inject 22.5 mg into the muscle every 6 (six) months.    . valACYclovir (VALTREX) 1000 MG tablet Take 1 tablet (1,000 mg total) by mouth 2 (two) times daily. 60 tablet 6  . venlafaxine XR (EFFEXOR-XR) 37.5 MG 24 hr capsule Take 1 capsule (37.5 mg total) by mouth daily with breakfast. (Patient not taking: Reported on 12/21/2017) 30 capsule 11   No current facility-administered medications for this visit.     PHYSICAL EXAMINATION: ECOG PERFORMANCE STATUS: 0 - Asymptomatic  There were no vitals taken for this visit.  There were no vitals filed for this visit.  GENERAL: Well-nourished well-developed; Alert, no distress and comfortable.  Alone. EYES: no pallor or icterus OROPHARYNX: no thrush or ulceration; NECK: supple; no lymph nodes felt. LYMPH:  no palpable lymphadenopathy in the axillary or inguinal regions LUNGS: Decreased breath sounds auscultation bilaterally. No wheeze or crackles HEART/CVS: regular rate & rhythm and no murmurs; No lower extremity edema ABDOMEN:abdomen soft, non-tender and normal bowel sounds. No hepatomegaly or splenomegaly.  Musculoskeletal:no cyanosis of digits and no clubbing  PSYCH:  alert & oriented x 3 with fluent speech NEURO: no focal motor/sensory deficits SKIN:  no rashes or significant lesions    LABORATORY DATA:  I have reviewed the data as listed    Component Value Date/Time   NA 140 01/11/2018 0947   NA 137 11/12/2014 1415   K 4.1 01/11/2018 0947   K 4.1 11/12/2014 1415   CL 107 01/11/2018 0947   CL 105 11/12/2014 1415   CO2 26 01/11/2018 0947   CO2 24 11/12/2014 1415   GLUCOSE 91 01/11/2018 0947   GLUCOSE 98 11/12/2014 1415   BUN 20 01/11/2018 0947   BUN 24 (H) 11/12/2014 1415   CREATININE 0.96 01/11/2018 0947   CREATININE 1.02 11/12/2014 1415   CALCIUM 9.4 01/11/2018 0947   CALCIUM 9.2 11/12/2014 1415   PROT 6.8 01/11/2018 0947   PROT 7.4 11/12/2014 1415   ALBUMIN 4.1 01/11/2018 0947   ALBUMIN 4.4 11/12/2014 1415   AST 17 01/11/2018 0947   AST 23 11/12/2014 1415   ALT 9 01/11/2018 0947   ALT 16 (L) 11/12/2014 1415   ALKPHOS 77 01/11/2018 0947   ALKPHOS 103 11/12/2014 1415   BILITOT 0.7 01/11/2018 0947   BILITOT 0.4 11/12/2014 1415   GFRNONAA >60  01/11/2018 0947   GFRNONAA >60 11/12/2014 1415   GFRAA >60 01/11/2018 0947   GFRAA >60 11/12/2014 1415    No results found for: SPEP, UPEP  Lab Results  Component Value Date   WBC 3.9 01/11/2018   NEUTROABS 2.0 01/11/2018   HGB 13.9 01/11/2018   HCT 40.3 01/11/2018   MCV 96.6 01/11/2018   PLT 231 01/11/2018      Chemistry      Component Value Date/Time   NA 140 01/11/2018 0947   NA 137 11/12/2014 1415   K 4.1 01/11/2018 0947   K 4.1 11/12/2014 1415   CL 107 01/11/2018 0947   CL 105 11/12/2014 1415   CO2 26 01/11/2018 0947   CO2 24 11/12/2014 1415   BUN 20 01/11/2018 0947   BUN 24 (H) 11/12/2014 1415   CREATININE 0.96 01/11/2018 0947   CREATININE 1.02 11/12/2014 1415      Component Value Date/Time   CALCIUM 9.4 01/11/2018 0947   CALCIUM 9.2 11/12/2014 1415   ALKPHOS 77 01/11/2018 0947   ALKPHOS 103 11/12/2014 1415   AST 17 01/11/2018 0947   AST 23 11/12/2014 1415    ALT 9 01/11/2018 0947   ALT 16 (L) 11/12/2014 1415   BILITOT 0.7 01/11/2018 0947   BILITOT 0.4 11/12/2014 1415       RADIOGRAPHIC STUDIES: I have personally reviewed the radiological images as listed and agreed with the findings in the report. No results found.   ASSESSMENT & PLAN:  Prostate cancer metastatic to multiple sites [facility]) #  castrate resistant prostate cancer-stage IV; on Trelstar; Xtandi.  Stable.  Continue current therapy.  #Right lower lobe fungal infection-status post Diflucan.  Check CT scan in approximately month.  #Hot flashes secondary to anti-testosterone therapy.  Currently on Ditropan.  Overall improved.  No prescription.  #Mild bilateral lower extremity swelling/skin hyperpigmentation-status post evaluation with Dr. Lucky Cowboy.  Improved.  #Bone lesions from prostate cancer on Xgeva.  Stable.  trelstar- due on aug 7th;  follow up in 1 month/cbc-cmp;x-geva/ PSA; CT csan chest prior.    Orders Placed This Encounter  Procedures  . CT CHEST W CONTRAST    Standing Status:   Future    Standing Expiration Date:   01/12/2019    Order Specific Question:   If indicated for the ordered procedure, I authorize the administration of contrast media per Radiology protocol    Answer:   Yes    Order Specific Question:   Preferred imaging location?    Answer:   [facility]    Order Specific Question:   Radiology Contrast Protocol - do NOT remove file path    Answer:   [email or web address]  . CBC with Differential/Platelet    Standing Status:   Future    Standing Expiration Date:   01/12/2019  . Comprehensive metabolic panel    Standing Status:   Future    Standing Expiration Date:   01/12/2019  . PSA    Standing Status:   Future    Standing Expiration Date:   01/12/2019   All questions were answered. The patient knows to call the clinic with any problems, questions or concerns.      Cammie Sickle, MD 01/13/2018 11:27 AM

## 2018-01-11 NOTE — Assessment & Plan Note (Addendum)
#    castrate resistant prostate cancer-stage IV; on Trelstar; Xtandi.  Stable.  Continue current therapy.  #Right lower lobe fungal infection-status post Diflucan.  Check CT scan in approximately month.  #Hot flashes secondary to anti-testosterone therapy.  Currently on Ditropan.  Overall improved.  No prescription.  #Mild bilateral lower extremity swelling/skin hyperpigmentation-status post evaluation with Dr. Lucky Cowboy.  Improved.  #Bone lesions from prostate cancer on Xgeva.  Stable.  trelstar- due on aug 7th;  follow up in 1 month/cbc-cmp;x-geva/ PSA; CT csan chest prior.

## 2018-01-14 ENCOUNTER — Telehealth: Payer: Self-pay | Admitting: *Deleted

## 2018-01-14 NOTE — Telephone Encounter (Signed)
T/C made to William Wright to inform of PSA results at William Wright request. Patient informed that his PSA, which was drawn on 01/11/18  is 13.25. Patient voices some concern that it is higher than last month, but Dr. Rogue Wright said this was OK. Patient voices concern that he is not having a bone scan this time, but will have a chest CT only. States he is having some transient back pain, and that it moves around. He feels this may be another kidney stone, but is surprised he is not getting a bone scan. Discussed the additional radiation concern with patient, but he also said that Dr. Rogue Wright is aware of his recent back pain. He also discussed that he currently has hemorrhoids that are bothersome. He is using OTC cream and sitz baths to no avail. Reports his last colonoscopy was about 5 years ago. Suggested that he follow up with his GI, Dr. Vira Wright if this problem does not soon improve. Patient agrees. Informed/reminded of CT appointment on 02/06/18 at 10:00am at the [facility] on [address]. William Wright, BSN, MHA, OCN 01/14/2018 11:13 AM

## 2018-01-18 ENCOUNTER — Other Ambulatory Visit: Payer: Self-pay | Admitting: *Deleted

## 2018-01-18 ENCOUNTER — Other Ambulatory Visit: Payer: Self-pay

## 2018-01-18 MED ORDER — TAMSULOSIN HCL 0.4 MG PO CAPS: 0.4000 mg | ORAL_CAPSULE | Freq: Every day | ORAL | 11 refills | Status: DC

## 2018-01-28 ENCOUNTER — Telehealth: Payer: Self-pay | Admitting: *Deleted

## 2018-01-28 NOTE — Telephone Encounter (Signed)
Received t/c from patient William Wright, who states he called the [facility] on 01/18/18 and requested a refill on his Flomax. States this was supposed to be sent to [facility], but was not. He also states he thought it might be sent to [facility], but it was not. Upon further investigation, the prescription was sent to [facility], which is where he gets his Abiraterone. States he is completely out of the medication now and requests that it be sent to [facility]. Spoke with Dr. [name] regarding above information and he states OK to call in tamsulosin (Flomax) 0.4mg , one capsule daily, #90 with 3 refills. Flomax called to [facility] per Dr. Aletha Halim instructions. Patient called to inform that medication has been called to [place] per his request. [name], BSN, MHA, OCN 01/28/2018 3:35 PM

## 2018-01-29 DIAGNOSIS — Z1283 Encounter for screening for malignant neoplasm of skin: Secondary | ICD-10-CM | POA: Diagnosis not present

## 2018-01-29 DIAGNOSIS — L821 Other seborrheic keratosis: Secondary | ICD-10-CM | POA: Diagnosis not present

## 2018-01-31 ENCOUNTER — Telehealth: Payer: Self-pay | Admitting: *Deleted

## 2018-01-31 ENCOUNTER — Other Ambulatory Visit: Payer: Self-pay | Admitting: Internal Medicine

## 2018-01-31 DIAGNOSIS — C61 Malignant neoplasm of prostate: Secondary | ICD-10-CM

## 2018-01-31 NOTE — Telephone Encounter (Signed)
Received t/c from patient William Wright reporting that he is experiencing increased pain in his back and new pain in his upper legs (hip and upper thigh area.) [name], BSN, MHA, OCN 01/31/2018 2:09 PM

## 2018-01-31 NOTE — Telephone Encounter (Signed)
msg also sent to prior auth team to update on new orders.  Colette, please schedule these additional test. Thanks.Nira Conn

## 2018-01-31 NOTE — Telephone Encounter (Signed)
William Wright reports he has been experiencing back pain in his waist and lower back areas with a burning sensation at times, but reports this has gradually improved and now he is experiencing pain in his hips and upper thighs bilaterally, with right side a little worse than left. Patient reports this hip & thigh pain began just over a week ago, and states he has taken acetaminophen with no relief and has used his "pain cream" and this only helped for a short time. Rates the hip/thigh pain as 8/10 on pain scale presently. States his back pain is about a 3/10 at present but has been as bad as 5-6/10 at times. He is scheduled for a chest CT next week and is requesting a CT of abdomen and pelvis as well if Dr. [name] agrees. William Wright also states he will take a stronger pain medication only if it does not cause constipation. Dr. [name] informed of all information above and he states to add CT of abdomen and pelvis with contrast as well as a whole body bone scan given patient's known bone mets. He also states to call in some Tramadol 50 mg for patient to take every 8 hours as needed since he is not taking the Nucynta that was prescribed by Dr. Rogers Blocker. Orders for CT and bone scan placed and scheduler Colette Dixon notified to schedule the scans. Tramadol 50 mg, one by mouth every 8 hours as needed, #40 with no refill called in to [facility] per Dr. Aletha Halim verbal orders. Pharmacist readback performed to verify. Patient called back and notified of new scan orders and schedule reviewed that all scans will be on 07/24 - but bone scan will be at [facility] - with injection at 9:00 and bone scan at 12:30, and CT scans will be at [facility] facility at 10:00am. Patient also informed that his new pain medication, Tramadol has now been called to [facility]. [name], BSN, MHA, OCN 01/31/2018  2:37 PM

## 2018-02-04 ENCOUNTER — Other Ambulatory Visit: Payer: Self-pay | Admitting: *Deleted

## 2018-02-04 NOTE — Telephone Encounter (Signed)
Received t/c from patient Zachrey Deutscher stating he is still experiencing back and leg pain radiating down the front of his thighs to his knees. Wonders if this is sciatica, but that would typically have pain radiating down back of legs. Instructed that it could still be nerve related. Patient asks whether the bone or CT scan might show what is going on with his back and was informed that it should. He also reports he has been taking some Ibuprofen in between doses of Tramadol and asks if this is safe. Informed that he can take Ibuprofen safely with Tramadol and advised to take the Ibuprofen as directed in between doses of Tramadol for better pain control. Discussed moving scans up sooner; however this will only possibly get them scheduled one day sooner and patient prefers to leave the scans as they are unless his pain worsens. Instructed to call back if the pain gets worse and that I will inform Dr. [name] of all we have discussed.  [name], BSN, MHA, OCN 02/04/2018 11:18 AM

## 2018-02-05 ENCOUNTER — Telehealth: Payer: Self-pay | Admitting: *Deleted

## 2018-02-05 MED ORDER — HYDROCODONE-ACETAMINOPHEN 5-325 MG PO TABS: 1.0000 | ORAL_TABLET | Freq: Every day | ORAL | 0 refills | Status: DC

## 2018-02-05 NOTE — Telephone Encounter (Signed)
T/C made to and returned from William Wright regarding his pain. States he slept better last night, but feels it was just because he was so tired from not sleeping. Questioned patient again regarding whether he would be willing to take a stronger pain medication and he states he is willing to take it at bedtime to help him sleep. Dr. [name] informed of this and will send a prescription for a narcotic analgesic to the [facility]. Patient called back to inform of this and instructed to check with his pharmacy to see when available or pick up. [name], BSN, MHA, OCN 02/05/2018 11:27 AM

## 2018-02-05 NOTE — Telephone Encounter (Signed)
Dr. [name] would like to send in [place] 1 tablet at bedtime to [facility] to help with pain mgmt.

## 2018-02-06 ENCOUNTER — Ambulatory Visit
Admission: RE | Admit: 2018-02-06 | Discharge: 2018-02-06 | Disposition: A | Discharge status: Home / Self Care | Payer: Medicare Other | Source: Ambulatory Visit | Attending: Internal Medicine | Admitting: Internal Medicine

## 2018-02-06 ENCOUNTER — Ambulatory Visit: Admission: RE | Admit: 2018-02-06 | Payer: Medicare Other | Source: Ambulatory Visit

## 2018-02-06 DIAGNOSIS — I712 Thoracic aortic aneurysm, without rupture: Secondary | ICD-10-CM | POA: Diagnosis not present

## 2018-02-06 DIAGNOSIS — C7951 Secondary malignant neoplasm of bone: Secondary | ICD-10-CM | POA: Diagnosis not present

## 2018-02-06 DIAGNOSIS — C61 Malignant neoplasm of prostate: Secondary | ICD-10-CM | POA: Insufficient documentation

## 2018-02-06 DIAGNOSIS — R911 Solitary pulmonary nodule: Secondary | ICD-10-CM | POA: Diagnosis not present

## 2018-02-06 MED ORDER — TECHNETIUM TC 99M MEDRONATE IV KIT
22.5220 | PACK | Freq: Once | INTRAVENOUS | Status: AC | PRN
  Administered 2018-02-06: 22.522 via INTRAVENOUS

## 2018-02-06 MED ORDER — IOPAMIDOL (ISOVUE-300) INJECTION 61%
100.0000 mL | Freq: Once | INTRAVENOUS | Status: AC | PRN
  Administered 2018-02-06: 100 mL via INTRAVENOUS

## 2018-02-06 MED ORDER — ABIRATERONE ACETATE 250 MG PO TABS: 1000.0000 mg | ORAL_TABLET | Freq: Every day | ORAL | 4 refills | Status: DC

## 2018-02-07 ENCOUNTER — Telehealth: Payer: Self-pay | Admitting: Internal Medicine

## 2018-02-07 ENCOUNTER — Telehealth: Payer: Self-pay | Admitting: *Deleted

## 2018-02-07 ENCOUNTER — Telehealth: Payer: Self-pay

## 2018-02-07 NOTE — Telephone Encounter (Signed)
William Wright, please inform patient that CT scan/bone scan-stable disease; no obvious evidence of progression.  We will discuss further tomorrow/clinic visit.  Thank you

## 2018-02-07 NOTE — Telephone Encounter (Signed)
Our office has received two refill requests for Zytiga, which patient is no longer taking. I contacted patient to confirm that he is not taking this medication, and he states that he is not. I have removed Zytiga from his current medication list. Thanks!

## 2018-02-07 NOTE — Telephone Encounter (Signed)
T/C made to Bernerd Limbo to give results of CT and Bone scans at Dr. Aletha Halim request. Patient informed that his scans did not show any progression of his cancer and that his disease is stable. Unfortunately, the scans did not show any reason for the pain either. Patient states his pain has not improved and is stlill worse at night - even with use of the new pain medication. Patient has appointment to see Dr. [name] tomorrow and he will assess and discuss the pain further at that time. Patient states he is relieved that it is not the cancer causing it. [name], BSN, MHA, OCN 02/07/2018 9:10 AM

## 2018-02-08 ENCOUNTER — Other Ambulatory Visit: Payer: Self-pay

## 2018-02-08 ENCOUNTER — Ambulatory Visit
Admission: RE | Admit: 2018-02-08 | Discharge: 2018-02-08 | Disposition: A | Discharge status: Home / Self Care | Payer: Medicare Other | Source: Ambulatory Visit | Attending: Internal Medicine | Admitting: Internal Medicine

## 2018-02-08 ENCOUNTER — Inpatient Hospital Stay (HOSPITAL_BASED_OUTPATIENT_CLINIC_OR_DEPARTMENT_OTHER): Payer: Medicare Other | Admitting: Internal Medicine

## 2018-02-08 ENCOUNTER — Inpatient Hospital Stay: Payer: Medicare Other | Attending: Internal Medicine

## 2018-02-08 ENCOUNTER — Inpatient Hospital Stay: Payer: Medicare Other

## 2018-02-08 VITALS — BP 150/77 | HR 59 | Temp 97.9°F | Resp 18 | Ht 72.0 in | Wt 153.6 lb

## 2018-02-08 DIAGNOSIS — Z79899 Other long term (current) drug therapy: Secondary | ICD-10-CM

## 2018-02-08 DIAGNOSIS — C7951 Secondary malignant neoplasm of bone: Secondary | ICD-10-CM | POA: Insufficient documentation

## 2018-02-08 DIAGNOSIS — G893 Neoplasm related pain (acute) (chronic): Secondary | ICD-10-CM | POA: Insufficient documentation

## 2018-02-08 DIAGNOSIS — M25552 Pain in left hip: Secondary | ICD-10-CM

## 2018-02-08 DIAGNOSIS — Z79818 Long term (current) use of other agents affecting estrogen receptors and estrogen levels: Secondary | ICD-10-CM

## 2018-02-08 DIAGNOSIS — C61 Malignant neoplasm of prostate: Secondary | ICD-10-CM

## 2018-02-08 DIAGNOSIS — M5442 Lumbago with sciatica, left side: Secondary | ICD-10-CM | POA: Diagnosis not present

## 2018-02-08 DIAGNOSIS — M545 Low back pain: Secondary | ICD-10-CM

## 2018-02-08 DIAGNOSIS — M25551 Pain in right hip: Secondary | ICD-10-CM

## 2018-02-08 DIAGNOSIS — R911 Solitary pulmonary nodule: Secondary | ICD-10-CM

## 2018-02-08 DIAGNOSIS — N400 Enlarged prostate without lower urinary tract symptoms: Secondary | ICD-10-CM | POA: Diagnosis not present

## 2018-02-08 DIAGNOSIS — M79606 Pain in leg, unspecified: Secondary | ICD-10-CM | POA: Diagnosis not present

## 2018-02-08 DIAGNOSIS — M5441 Lumbago with sciatica, right side: Secondary | ICD-10-CM

## 2018-02-08 DIAGNOSIS — M791 Myalgia, unspecified site: Secondary | ICD-10-CM | POA: Insufficient documentation

## 2018-02-08 DIAGNOSIS — Z801 Family history of malignant neoplasm of trachea, bronchus and lung: Secondary | ICD-10-CM

## 2018-02-08 DIAGNOSIS — Z806 Family history of leukemia: Secondary | ICD-10-CM | POA: Insufficient documentation

## 2018-02-08 DIAGNOSIS — M79605 Pain in left leg: Secondary | ICD-10-CM | POA: Diagnosis not present

## 2018-02-08 DIAGNOSIS — M79604 Pain in right leg: Secondary | ICD-10-CM | POA: Insufficient documentation

## 2018-02-08 LAB — CBC WITH DIFFERENTIAL/PLATELET
BASOS ABS: 0 10*3/uL (ref 0–0.1)
Basophils Relative: 1 %
Eosinophils Absolute: 0.1 10*3/uL (ref 0–0.7)
Eosinophils Relative: 4 %
HCT: 41.4 % (ref 40.0–52.0)
Hemoglobin: 14.1 g/dL (ref 13.0–18.0)
Lymphocytes Relative: 35 %
Lymphs Abs: 1.4 10*3/uL (ref 1.0–3.6)
MCH: 33.1 pg (ref 26.0–34.0)
MCHC: 34.2 g/dL (ref 32.0–36.0)
MCV: 96.9 fL (ref 80.0–100.0)
Monocytes Absolute: 0.5 10*3/uL (ref 0.2–1.0)
Monocytes Relative: 11 %
Neutro Abs: 2 10*3/uL (ref 1.4–6.5)
Neutrophils Relative %: 49 %
Platelets: 223 10*3/uL (ref 150–440)
RBC: 4.28 MIL/uL — ABNORMAL LOW (ref 4.40–5.90)
RDW: 14.2 % (ref 11.5–14.5)
WBC: 4 10*3/uL (ref 3.8–10.6)

## 2018-02-08 LAB — COMPREHENSIVE METABOLIC PANEL
ALBUMIN: 4.3 g/dL (ref 3.5–5.0)
ALT: 11 U/L (ref 0–44)
AST: 14 U/L — AB (ref 15–41)
Alkaline Phosphatase: 83 U/L (ref 38–126)
Anion gap: 8 (ref 5–15)
BILIRUBIN TOTAL: 0.7 mg/dL (ref 0.3–1.2)
BUN: 21 mg/dL (ref 8–23)
CO2: 24 mmol/L (ref 22–32)
Calcium: 9.3 mg/dL (ref 8.9–10.3)
Chloride: 106 mmol/L (ref 98–111)
Creatinine, Ser: 0.97 mg/dL (ref 0.61–1.24)
GFR calc Af Amer: >60 mL/min (ref >60)
GFR calc non Af Amer: >60 mL/min (ref >60)
GLUCOSE: 99 mg/dL (ref 70–99)
POTASSIUM: 4.5 mmol/L (ref 3.5–5.1)
Sodium: 138 mmol/L (ref 135–145)
TOTAL PROTEIN: 6.9 g/dL (ref 6.5–8.1)

## 2018-02-08 LAB — PSA: Prostatic Specific Antigen: 15.97 ng/mL — ABNORMAL HIGH (ref 0.00–4.00)

## 2018-02-08 MED ORDER — DENOSUMAB 120 MG/1.7ML ~~LOC~~ SOLN
120.0000 mg | Freq: Once | SUBCUTANEOUS | Status: AC
  Administered 2018-02-08: 120 mg via SUBCUTANEOUS
  Filled 2018-02-08: qty 1.7

## 2018-02-08 MED ORDER — GADOBENATE DIMEGLUMINE 529 MG/ML IV SOLN
15.0000 mL | Freq: Once | INTRAVENOUS | Status: AC | PRN
  Administered 2018-02-08: 14 mL via INTRAVENOUS

## 2018-02-08 NOTE — Progress Notes (Signed)
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- ZYTIGA plus minus XTANDI; June 28th-CT-C/A/P- Stable Retrocrural/RP/Pelvic LN;Bone scan- Stable sclerotic lesions  # MARCH 2019- Taken off trial [ based on interm analysis]; continue X-tandi [given not needing steroids]   # RLL [~27m lung nodule] s/p Bx- Cryptococcus- ID/Dr.Fitzgerald- on diflucan   # Xgeva  # MOLECULAR TESTING/omniseq- BRCA-2 MUTATED; PDL-1- NEG; MSS; N-TRK-NEG  # GENETICS- BRCA-NEG; hetrozygous MUTHY; recommended family work up --------------------------------------------------    DIAGNOSIS: _0  Metastatic prostate cancer  STAGE: 4    ;GOALS: Palliative  CURRENT/MOST RECENT THERAPY-X-tandi+ Trelstar      [place] ([place]   08/17/2012 Initial Diagnosis    Cancer of prostate, stage II      12/03/2013 Progression         10/13/2014 Progression          Prostate cancer metastatic to multiple sites ([facility]      INTERVAL HISTORY:  PKIKO [id number].  male pleasant patient above history of metastatic castrate resistant prostate cancer is here for follow-up/review the results of his CAT scans bone scan.  Patient has been complaining of low back pain 7-8 on a scale of 10 radiating to bilateral lower extremities.  For the last 3 weeks.  He has been sparingly been taking hydrocodone 1 at nighttime.   Denies an bladder or bowel incontinence.    Otherwise denies any cough or shortness of breath or chest  pain.  Continues to have intermittent hot flashes.  Review of Systems  Constitutional: Negative for chills, diaphoresis, fever, malaise/fatigue and weight loss.  HENT: Negative for nosebleeds and sore throat.   Eyes: Negative for double vision.  Respiratory: Negative for cough, hemoptysis, sputum production, shortness of breath and wheezing.   Cardiovascular: Negative for chest pain, palpitations, orthopnea and leg swelling.  Gastrointestinal: Negative for abdominal pain, blood in stool, constipation, diarrhea, heartburn, melena, nausea and vomiting.  Genitourinary: Negative for dysuria, frequency (Chronic.) and urgency.  Musculoskeletal: Positive for back pain and joint pain.  Skin: Negative.  Negative for itching and rash.  Neurological: Negative for dizziness, tingling, focal weakness, weakness and headaches.  Endo/Heme/Allergies: Does not bruise/bleed easily.  Psychiatric/Behavioral: Negative for depression. The patient is not nervous/anxious and does not have insomnia.       PAST MEDICAL HISTORY :  Past Medical History:  Diagnosis Date  . [place]  . Family history of leukemia   . [place]state cancer ([place]     PAST SURGICAL HISTORY :   Past Surgical History:  Procedure Laterality Date  . EXTRACORPOREAL SHOCK WAVE LITHOTRIPSY Left 05/20/2015   Procedure: EXTRACORPOREAL SHOCK WAVE LITHOTRIPSY (ESWL);  Surgeon: MRoyston Cowper MD;  Location: [facility] ORS;  Service: Urology;  Laterality: Left;  . EXTRACORPOREAL SHOCK WAVE LITHOTRIPSY Right 12/21/2016   Procedure: EXTRACORPOREAL SHOCK WAVE LITHOTRIPSY (ESWL);  Surgeon: WRoyston Cowper MD;  Location: [facility] ORS;  Service: Urology;  Laterality: Right;    FAMILY HISTORY :   Family  History  Problem Relation Age of Onset  . Alzheimer's disease Father   . Lung cancer Maternal Grandfather 17  . Leukemia Paternal Grandmother 90  . COPD Paternal Grandfather   . Cancer Maternal Uncle        type unk, dx 80's     SOCIAL HISTORY:   Social History   Tobacco Use  . Smoking status: Never Smoker  . Smokeless tobacco: Never Used  Substance Use Topics  . Alcohol use: No  . Drug use: No    ALLERGIES:  is allergic to no known allergies.  MEDICATIONS:  Current Outpatient Medications  Medication Sig Dispense Refill  . enzalutamide (XTANDI) 40 MG capsule Take 4 capsules by mouth daily.    Marland Kitchen HYDROcodone-acetaminophen (NORCO) 5-325 MG tablet Take 1 tablet by mouth at bedtime. For pain 30 tablet 0  . oxybutynin (DITROPAN-XL) 5 MG 24 hr tablet Take 1 tablet (5 mg total) by mouth at bedtime. 90 tablet 3  . tamsulosin (FLOMAX) 0.4 MG CAPS capsule Take 1 capsule (0.4 mg total) by mouth daily. 30 capsule 11  . Triptorelin Pamoate (TRELSTAR) 22.5 MG injection Inject 22.5 mg into the muscle every 6 (six) months.    . valACYclovir (VALTREX) 1000 MG tablet Take 1 tablet (1,000 mg total) by mouth 2 (two) times daily. (Patient not taking: Reported on 02/08/2018) 60 tablet 6   No current facility-administered medications for this visit.     PHYSICAL EXAMINATION: ECOG PERFORMANCE STATUS: 1 - Symptomatic but completely ambulatory  BP (!) 150/77   Pulse (!) 59   Temp 97.9 F (36.6 C) (Oral)   Resp 18   Ht 6' (1.829 m)   Wt 153 lb 9.6 oz (69.7 kg)   BMI 20.83 kg/m   Filed Weights   02/08/18 1031  Weight: 153 lb 9.6 oz (69.7 kg)    GENERAL: Well-nourished well-developed; Alert, no distress and comfortable.  Accompanied by family.  EYES: no pallor or icterus OROPHARYNX: no thrush or ulceration; NECK: supple; no lymph nodes felt. LYMPH:  no palpable lymphadenopathy in the axillary or inguinal regions LUNGS: Decreased breath sounds auscultation bilaterally. No wheeze or crackles HEART/CVS: regular rate & rhythm and no murmurs; No lower extremity edema ABDOMEN:abdomen soft, non-tender and normal bowel sounds. No hepatomegaly or splenomegaly.  Musculoskeletal:no cyanosis of digits and no clubbing  PSYCH:  alert & oriented x 3 with fluent speech NEURO: no focal motor/sensory deficits SKIN:  no rashes or significant lesions    LABORATORY DATA:  I have reviewed the data as listed    Component Value Date/Time   NA 138 02/08/2018 1008   NA 137 11/12/2014 1415   K 4.5 02/08/2018 1008   K 4.1 11/12/2014 1415   CL 106 02/08/2018 1008   CL 105 11/12/2014 1415   CO2 24 02/08/2018 1008   CO2 24 11/12/2014 1415   GLUCOSE 99 02/08/2018 1008   GLUCOSE 98 11/12/2014 1415   BUN 21 02/08/2018 1008   BUN 24 (H) 11/12/2014 1415   CREATININE 0.97 02/08/2018 1008   CREATININE 1.02 11/12/2014 1415   CALCIUM 9.3 02/08/2018 1008   CALCIUM 9.2 11/12/2014 1415   PROT 6.9 02/08/2018 1008   PROT 7.4 11/12/2014 1415   ALBUMIN 4.3 02/08/2018 1008   ALBUMIN 4.4 11/12/2014 1415   AST 14 (L) 02/08/2018 1008   AST 23 11/12/2014 1415   ALT 11 02/08/2018 1008   ALT 16 (L) 11/12/2014 1415   ALKPHOS 83 02/08/2018 1008   ALKPHOS 103 11/12/2014 1415  BILITOT 0.7 02/08/2018 1008   BILITOT 0.4 11/12/2014 1415   GFRNONAA >60 02/08/2018 1008   GFRNONAA >60 11/12/2014 1415   GFRAA >60 02/08/2018 1008   GFRAA >60 11/12/2014 1415    No results found for: SPEP, UPEP  Lab Results  Component Value Date   WBC 4.0 02/08/2018   NEUTROABS 2.0 02/08/2018   HGB 14.1 02/08/2018   HCT 41.4 02/08/2018   MCV 96.9 02/08/2018   PLT 223 02/08/2018      Chemistry      Component Value Date/Time   NA 138 02/08/2018 1008   NA 137 11/12/2014 1415   K 4.5 02/08/2018 1008   K 4.1 11/12/2014 1415   CL 106 02/08/2018 1008   CL 105 11/12/2014 1415   CO2 24 02/08/2018 1008   CO2 24 11/12/2014 1415   BUN 21 02/08/2018 1008   BUN 24 (H) 11/12/2014 1415   CREATININE 0.97 02/08/2018 1008   CREATININE 1.02 11/12/2014 1415      Component Value Date/Time   CALCIUM 9.3 02/08/2018 1008   CALCIUM 9.2 11/12/2014 1415   ALKPHOS 83 02/08/2018 1008   ALKPHOS 103 11/12/2014 1415   AST 14 (L) 02/08/2018 1008   AST 23 11/12/2014  1415   ALT 11 02/08/2018 1008   ALT 16 (L) 11/12/2014 1415   BILITOT 0.7 02/08/2018 1008   BILITOT 0.4 11/12/2014 1415       RADIOGRAPHIC STUDIES: I have personally reviewed the radiological images as listed and agreed with the findings in the report. Nm Bone Scan Whole Body  Result Date: 02/06/2018 CLINICAL DATA:  Prostate malignancy. One month of low back pain and upper leg pain without recent falls or other trauma. EXAM: NUCLEAR MEDICINE WHOLE BODY BONE SCAN TECHNIQUE: Whole body anterior and posterior images were obtained approximately 3 hours after intravenous injection of radiopharmaceutical. RADIOPHARMACEUTICALS:  22.52 mCi Technetium-77mMDP IV COMPARISON:  Nuclear bone scan of August 22, 2017 FINDINGS: There is adequate uptake of the radiopharmaceutical by the skeleton. There is adequate soft tissue clearance and renal activity. There is persistent abnormal uptake associated with the right shoulder centered in the glenoid. There is stable mildly increased uptake within the posterior aspect of the right seventh rib. There is persistent increased uptake in the posterior aspects of the lower thoracic spine. Intensely increased uptake is present in the upper and mid lumbar spine similar to that seen previously. There is a stable focus of increased uptake in the medial aspect of the right iliac crest. There is no abnormal uptake observed in the lower extremities. IMPRESSION: No new foci of abnormal skeletal uptake are observed. Findings compatible with metastatic disease to the right glenoid, lower thoracic lumbar spine, and pelvis appear stable. Electronically Signed   By: David  JMartiniqueM.D.   On: 02/06/2018 15:29     ASSESSMENT & PLAN:  Prostate cancer metastatic to multiple sites ([facility] #  castrate resistant prostate cancer-stage IV; on Trelstar; Xtandi.  July 24th 2019 CT scan chest and pelvis/bone scan-stable disease; improvement of the lung nodule right lower lobe [see discussion below];  PSA fairly stable; however worsening pain [see discussion below].  Clinical worsening.  #Given worsening pain lumbar- clinically concerning for worsening malignancy- although CT scan bone scan stable. Recommend MRI lumbar/pelvic spine for further evaluation.  Discussed with the patient regarding the role of external beam RT. I also discussed regarding Xofigo/and also chemotherapy.  Patient seemed reluctant with any of the therapeutic options mentioned; he wants to wait on the  MRI.  Patient has somatic BRCA2 mutation- could be candidate for PARP inhibitors.  #Pain second malignancy-new-recommend taking hydrocodone every 8 hours/and if concerned about side effects can use tramadol instead.  # Right lower lobe fungal infection-status post Diflucan.  CT scan shows improvement.  # Hot flashes secondary to anti-testosterone therapy. on Ditropan.  Improved.   #Bone lesions from prostate cancer on Xgeva. Stable on bone scan; but concerns for clinical progression.  See discussion above.  # MRI lumbar/pelvic STAT; and then referral to Dr.Crystal.  Follow-up to be decided.  # I reviewed the blood work- with the patient in detail; also reviewed the imaging independently [as summarized above]; and with the patient in detail.     Orders Placed This Encounter  Procedures  . MR Lumbar Spine W Wo Contrast    Standing Status:   Future    Standing Expiration Date:   02/08/2019    Order Specific Question:   ** REASON FOR EXAM (FREE TEXT)    Answer:   prostate cancer ot bone    Order Specific Question:   If indicated for the ordered procedure, I authorize the administration of contrast media per Radiology protocol    Answer:   Yes    Order Specific Question:   What is the patient's sedation requirement?    Answer:   No Sedation    Order Specific Question:   Does the patient have a pacemaker or implanted devices?    Answer:   No    Order Specific Question:   Use SRS Protocol?    Answer:   Yes    Order  Specific Question:   Radiology Contrast Protocol - do NOT remove file path    Answer:   \\[email or web address]    Order Specific Question:   Preferred imaging location?    Answer:   [facility] (table limit-300lbs)  . MR Pelvis W Contrast    Standing Status:   Future    Standing Expiration Date:   02/08/2019    Order Specific Question:   ** REASON FOR EXAM (FREE TEXT)    Answer:   prostate cancer to bone    Order Specific Question:   If indicated for the ordered procedure, I authorize the administration of contrast media per Radiology protocol    Answer:   Yes    Order Specific Question:   What is the patient's sedation requirement?    Answer:   No Sedation    Order Specific Question:   Does the patient have a pacemaker or implanted devices?    Answer:   No    Order Specific Question:   Radiology Contrast Protocol - do NOT remove file path    Answer:   \\[email or web address]    Order Specific Question:   Preferred imaging location?    Answer:   [facility] (table limit-300lbs)   All questions were answered. The patient knows to call the clinic with any problems, questions or concerns.      Cammie Sickle, MD 02/08/2018 1:35 PM

## 2018-02-08 NOTE — Assessment & Plan Note (Addendum)
#  castrate resistant prostate cancer-stage IV; on Trelstar; Xtandi.  July 24th 2019 CT scan chest and pelvis/bone scan-stable disease; improvement of the lung nodule right lower lobe [see discussion below]; PSA fairly stable; however worsening pain [see discussion below].  Clinical worsening.  #Given worsening pain lumbar- clinically concerning for worsening malignancy- although CT scan bone scan stable. Recommend MRI lumbar/pelvic spine for further evaluation.  Discussed with the patient regarding the role of external beam RT. I also discussed regarding Xofigo/and also chemotherapy.  Patient seemed reluctant with any of the therapeutic options mentioned; he wants to wait on the MRI.  Patient has somatic BRCA2 mutation- could be candidate for PARP inhibitors.  #Pain second malignancy-new-recommend taking hydrocodone every 8 hours/and if concerned about side effects can use tramadol instead.  # Right lower lobe fungal infection-status post Diflucan.  CT scan shows improvement.  # Hot flashes secondary to anti-testosterone therapy. on Ditropan.  Improved.   #Bone lesions from prostate cancer on Xgeva. Stable on bone scan; but concerns for clinical progression.  See discussion above.  # MRI lumbar/pelvic STAT; and then referral to Dr.Crystal.  Follow-up to be decided.  # I reviewed the blood work- with the patient in detail; also reviewed the imaging independently [as summarized above]; and with the patient in detail.

## 2018-02-11 ENCOUNTER — Inpatient Hospital Stay (HOSPITAL_BASED_OUTPATIENT_CLINIC_OR_DEPARTMENT_OTHER): Payer: Medicare Other | Admitting: Internal Medicine

## 2018-02-11 ENCOUNTER — Telehealth: Payer: Self-pay | Admitting: Internal Medicine

## 2018-02-11 ENCOUNTER — Other Ambulatory Visit: Payer: Self-pay

## 2018-02-11 VITALS — BP 129/82 | HR 76 | Temp 97.6°F | Ht 72.0 in

## 2018-02-11 DIAGNOSIS — R911 Solitary pulmonary nodule: Secondary | ICD-10-CM | POA: Diagnosis not present

## 2018-02-11 DIAGNOSIS — G893 Neoplasm related pain (acute) (chronic): Secondary | ICD-10-CM | POA: Diagnosis not present

## 2018-02-11 DIAGNOSIS — Z79899 Other long term (current) drug therapy: Secondary | ICD-10-CM | POA: Diagnosis not present

## 2018-02-11 DIAGNOSIS — C7951 Secondary malignant neoplasm of bone: Secondary | ICD-10-CM | POA: Diagnosis not present

## 2018-02-11 DIAGNOSIS — M545 Low back pain, unspecified: Secondary | ICD-10-CM

## 2018-02-11 DIAGNOSIS — C61 Malignant neoplasm of prostate: Secondary | ICD-10-CM

## 2018-02-11 DIAGNOSIS — M79604 Pain in right leg: Secondary | ICD-10-CM

## 2018-02-11 DIAGNOSIS — M79605 Pain in left leg: Secondary | ICD-10-CM | POA: Diagnosis not present

## 2018-02-11 DIAGNOSIS — M791 Myalgia, unspecified site: Secondary | ICD-10-CM

## 2018-02-11 DIAGNOSIS — M549 Dorsalgia, unspecified: Secondary | ICD-10-CM

## 2018-02-11 DIAGNOSIS — Z801 Family history of malignant neoplasm of trachea, bronchus and lung: Secondary | ICD-10-CM

## 2018-02-11 DIAGNOSIS — Z79818 Long term (current) use of other agents affecting estrogen receptors and estrogen levels: Secondary | ICD-10-CM

## 2018-02-11 DIAGNOSIS — M542 Cervicalgia: Secondary | ICD-10-CM

## 2018-02-11 DIAGNOSIS — Z806 Family history of leukemia: Secondary | ICD-10-CM | POA: Diagnosis not present

## 2018-02-11 MED ORDER — DEXAMETHASONE 2 MG PO TABS: 2.0000 mg | ORAL_TABLET | Freq: Every day | ORAL | 0 refills | Status: DC

## 2018-02-11 NOTE — Addendum Note (Signed)
Addended by: Sabino Gasser on: 02/11/2018 03:30 PM   Modules accepted: Orders

## 2018-02-11 NOTE — Progress Notes (Signed)
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 lung nodule] s/p Bx- Cryptococcus- ID/Dr.Fitzgerald- on diflucan   # Xgeva  # MOLECULAR TESTING/omniseq- BRCA-2 MUTATED; PDL-1- NEG; MSS; N-TRK-NEG  # GENETICS- BRCA-NEG; hetrozygous MUTHY; recommended family work up --------------------------------------------------    DIAGNOSIS: _0  Metastatic prostate cancer  STAGE: 4    ;GOALS: Palliative  CURRENT/MOST RECENT THERAPY-X-tandi+ Trelstar      [place] of prostate ([place]   08/17/2012 Initial Diagnosis    Cancer of prostate, stage II      12/03/2013 Progression         10/13/2014 Progression          Prostate cancer metastatic to multiple sites ([facility]      INTERVAL HISTORY:  William MAUE89y.[id number].  male pleasant patient above history of metastatic castrate resistant prostate cancer is here to review the results of his MRI of his back.  Patient continues to complain of worsening pain in his lower back radiating to his legs.  Denies any urinary incontinence or stool incontinence.  Denies any weakness in the legs.  Denies any tingling numbness legs.   Patient having difficulty sleeping at night.  Because of pain   Review of Systems  Constitutional: Negative for chills, diaphoresis, fever, malaise/fatigue and weight loss.  HENT: Negative for nosebleeds and sore throat.   Eyes: Negative for double vision.  Respiratory: Negative for cough, hemoptysis, sputum production, shortness of breath and wheezing.   Cardiovascular: Negative for chest pain, palpitations, orthopnea and leg swelling.  Gastrointestinal: Negative for abdominal pain, blood in stool, constipation, diarrhea, heartburn, melena, nausea and vomiting.  Genitourinary: Negative for dysuria, frequency and urgency.  Musculoskeletal: Positive for back pain and myalgias. Negative for joint pain.  Skin: Negative.  Negative for itching and rash.  Neurological: Negative for dizziness, tingling, focal weakness, weakness and headaches.  Endo/Heme/Allergies: Does not bruise/bleed easily.  Psychiatric/Behavioral: Negative for depression. The patient has insomnia. The patient is not nervous/anxious.       PAST MEDICAL HISTORY :  Past Medical History:  Diagnosis Date  . Cancer of [place] ([place] 07/2012  . Family history of leukemia   . [place]state cancer ([place]     PAST SURGICAL HISTORY :   Past Surgical History:  Procedure Laterality Date  . EXTRACORPOREAL SHOCK WAVE LITHOTRIPSY Left 05/20/2015   Procedure: EXTRACORPOREAL SHOCK WAVE LITHOTRIPSY (ESWL);  Surgeon: MRoyston Cowper MD;  Location: [facility] ORS;  Service: Urology;  Laterality: Left;  . EXTRACORPOREAL SHOCK WAVE LITHOTRIPSY Right 12/21/2016   Procedure: EXTRACORPOREAL SHOCK WAVE LITHOTRIPSY (ESWL);  Surgeon: WRoyston Cowper MD;  Location: [facility] ORS;  Service: Urology;  Laterality: Right;    FAMILY HISTORY :   Family  History  Problem Relation Age of Onset  . Alzheimer's disease Father   . Lung cancer Maternal Grandfather 43  . Leukemia Paternal Grandmother 37  . COPD Paternal Grandfather   . Cancer Maternal Uncle        type  unk, dx 80's    SOCIAL HISTORY:   Social History   Tobacco Use  . Smoking status: Never Smoker  . Smokeless tobacco: Never Used  Substance Use Topics  . Alcohol use: No  . Drug use: No    ALLERGIES:  is allergic to no known allergies.  MEDICATIONS:  Current Outpatient Medications  Medication Sig Dispense Refill  . enzalutamide (XTANDI) 40 MG capsule Take 4 capsules by mouth daily.    Marland Kitchen HYDROcodone-acetaminophen (NORCO) 5-325 MG tablet Take 1 tablet by mouth at bedtime. For pain (Patient taking differently: Take 1 tablet by mouth every 8 (eight) hours as needed for moderate pain. For pain) 30 tablet 0  . oxybutynin (DITROPAN-XL) 5 MG 24 hr tablet Take 1 tablet (5 mg total) by mouth at bedtime. 90 tablet 3  . tamsulosin (FLOMAX) 0.4 MG CAPS capsule Take 1 capsule (0.4 mg total) by mouth daily. 30 capsule 11  . Triptorelin Pamoate (TRELSTAR) 22.5 MG injection Inject 22.5 mg into the muscle every 6 (six) months.    . dexamethasone (DECADRON) 2 MG tablet Take 1 tablet (2 mg total) by mouth daily. Take 1 pill a t 5-6 pm each day. 30 tablet 0  . valACYclovir (VALTREX) 1000 MG tablet Take 1 tablet (1,000 mg total) by mouth 2 (two) times daily. (Patient not taking: Reported on 02/08/2018) 60 tablet 6   No current facility-administered medications for this visit.     PHYSICAL EXAMINATION: ECOG PERFORMANCE STATUS: 1 - Symptomatic but completely ambulatory  BP 129/82 (BP Location: Right Arm, Patient Position: Sitting)   Pulse 76   Temp 97.6 F (36.4 C) (Tympanic)   Ht 6' (1.829 m)   BMI 20.83 kg/m   There were no vitals filed for this visit.  GENERAL: Well-nourished well-developed; Alert, no distress and comfortable.  Alone.  EYES: no pallor or icterus OROPHARYNX: no thrush or ulceration; NECK: supple; no lymph nodes felt. LYMPH:  no palpable lymphadenopathy in the axillary or inguinal regions LUNGS: Decreased breath sounds auscultation bilaterally. No wheeze or crackles HEART/CVS:  regular rate & rhythm and no murmurs; No lower extremity edema ABDOMEN:abdomen soft, non-tender and normal bowel sounds. No hepatomegaly or splenomegaly.  Musculoskeletal:no cyanosis of digits and no clubbing  PSYCH: alert & oriented x 3 with fluent speech NEURO: no focal motor/sensory deficits SKIN:  no rashes or significant lesions    LABORATORY DATA:  I have reviewed the data as listed    Component Value Date/Time   NA 138 02/08/2018 1008   NA 137 11/12/2014 1415   K 4.5 02/08/2018 1008   K 4.1 11/12/2014 1415   CL 106 02/08/2018 1008   CL 105 11/12/2014 1415   CO2 24 02/08/2018 1008   CO2 24 11/12/2014 1415   GLUCOSE 99 02/08/2018 1008   GLUCOSE 98 11/12/2014 1415   BUN 21 02/08/2018 1008   BUN 24 (H) 11/12/2014 1415   CREATININE 0.97 02/08/2018 1008   CREATININE 1.02 11/12/2014 1415   CALCIUM 9.3 02/08/2018 1008   CALCIUM 9.2 11/12/2014 1415   PROT 6.9 02/08/2018 1008   PROT 7.4 11/12/2014 1415   ALBUMIN 4.3 02/08/2018 1008   ALBUMIN 4.4 11/12/2014 1415   AST 14 (L) 02/08/2018 1008  AST 23 11/12/2014 1415   ALT 11 02/08/2018 1008   ALT 16 (L) 11/12/2014 1415   ALKPHOS 83 02/08/2018 1008   ALKPHOS 103 11/12/2014 1415   BILITOT 0.7 02/08/2018 1008   BILITOT 0.4 11/12/2014 1415   GFRNONAA >60 02/08/2018 1008   GFRNONAA >60 11/12/2014 1415   GFRAA >60 02/08/2018 1008   GFRAA >60 11/12/2014 1415    No results found for: SPEP, UPEP  Lab Results  Component Value Date   WBC 4.0 02/08/2018   NEUTROABS 2.0 02/08/2018   HGB 14.1 02/08/2018   HCT 41.4 02/08/2018   MCV 96.9 02/08/2018   PLT 223 02/08/2018      Chemistry      Component Value Date/Time   NA 138 02/08/2018 1008   NA 137 11/12/2014 1415   K 4.5 02/08/2018 1008   K 4.1 11/12/2014 1415   CL 106 02/08/2018 1008   CL 105 11/12/2014 1415   CO2 24 02/08/2018 1008   CO2 24 11/12/2014 1415   BUN 21 02/08/2018 1008   BUN 24 (H) 11/12/2014 1415   CREATININE 0.97 02/08/2018 1008   CREATININE 1.02  11/12/2014 1415      Component Value Date/Time   CALCIUM 9.3 02/08/2018 1008   CALCIUM 9.2 11/12/2014 1415   ALKPHOS 83 02/08/2018 1008   ALKPHOS 103 11/12/2014 1415   AST 14 (L) 02/08/2018 1008   AST 23 11/12/2014 1415   ALT 11 02/08/2018 1008   ALT 16 (L) 11/12/2014 1415   BILITOT 0.7 02/08/2018 1008   BILITOT 0.4 11/12/2014 1415       RADIOGRAPHIC STUDIES: I have personally reviewed the radiological images as listed and agreed with the findings in the report. No results found.   ASSESSMENT & PLAN:  Prostate cancer metastatic to multiple sites [facility]) # Castrate resistant prostate cancer-stage IV; on Trelstar; Xtandi.  July 24th 2019 CT scan chest and pelvis/bone scan-stable disease; improvement of the lung nodule right lower lobe [see discussion below]; PSA- slightly rising ~15.  Worsening  # Worsening low back pain with radiation-MRI showing epidural extension at L2; L3 nerve root root compression-suggestive of progressive disease.  Discussed options including surgery; radiation. Given his excellent performance status; and multiple other lines of therapy/options available-surgery should be considered.  Also discussed the role of radiation post surgery.  In general his life expectancy would be measured in years.  #Also discussed the case with the [facility] neurosurgery Dr. Lacinda Axon; who will arrange appointment for the patient with Dr. Cari Caraway.  Also discussed with [place] neurosurgery clinic for a referral ASAP  #With regards to progressive disease-I would recommend switching therapies to-docetaxel [first preference]; carboplatin [BRCA2 mutated somatic]; Lonie Peak; amongst others.  # Pain second malignancy-worsened; recommend taking hydrocodone every 8 hours as needed; add Motrin in between; also recommend taking dexamethasone 2 mg at in the evening.  # follow up to be decided; STAT MRI of neck/T-spine.   # I reviewed the blood work- with the patient in detail; also reviewed the imaging  independently [as summarized above]; and with the patient in detail.      Orders Placed This Encounter  Procedures  . MR Cervical Spine W Wo Contrast    Standing Status:   Future    Standing Expiration Date:   02/11/2019    Order Specific Question:   ** REASON FOR EXAM (FREE TEXT)    Answer:   metastatic prostate cancer    Order Specific Question:   If indicated for the ordered procedure,  I authorize the administration of contrast media per Radiology protocol    Answer:   Yes    Order Specific Question:   What is the patient's sedation requirement?    Answer:   No Sedation    Order Specific Question:   Does the patient have a pacemaker or implanted devices?    Answer:   No    Order Specific Question:   Use SRS Protocol?    Answer:   No    Order Specific Question:   Radiology Contrast Protocol - do NOT remove file path    Answer:   \\[email or web address]    Order Specific Question:   Preferred imaging location?    Answer:   [facility] ([facility]) (table limit-300lbs)  . MR Thoracic Spine W Wo Contrast    Standing Status:   Future    Standing Expiration Date:   02/11/2019    Order Specific Question:   ** REASON FOR EXAM (FREE TEXT)    Answer:   metastatic prostate cancer    Order Specific Question:   GRA to provide read?    Answer:   Yes    Order Specific Question:   If indicated for the ordered procedure, I authorize the administration of contrast media per Radiology protocol    Answer:   Yes    Order Specific Question:   What is the patient's sedation requirement?    Answer:   No Sedation    Order Specific Question:   Use SRS Protocol?    Answer:   No    Order Specific Question:   Does the patient have a pacemaker or implanted devices?    Answer:   No    Order Specific Question:   Preferred imaging location?    Answer:   [facility] (table limit-300lbs)    Order Specific Question:   Radiology Contrast Protocol - do NOT remove file path    Answer:    \\[email or web address]   All questions were answered. The patient knows to call the clinic with any problems, questions or concerns.      Cammie Sickle, MD 02/11/2018 4:26 PM

## 2018-02-11 NOTE — Assessment & Plan Note (Addendum)
#  Castrate resistant prostate cancer-stage IV; on Trelstar; Xtandi.  July 24th 2019 CT scan chest and pelvis/bone scan-stable disease; improvement of the lung nodule right lower lobe [see discussion below]; PSA- slightly rising ~15.  Worsening  # Worsening low back pain with radiation-MRI showing epidural extension at L2; L3 nerve root root compression-suggestive of progressive disease.  Discussed options including surgery; radiation. Given his excellent performance status; and multiple other lines of therapy/options available-surgery should be considered.  Also discussed the role of radiation post surgery.  In general his life expectancy would be measured in years.  #Also discussed the case with the [facility] neurosurgery Dr. Lacinda Axon; who will arrange appointment for the patient with Dr. Cari Caraway.  Also discussed with [place] neurosurgery clinic for a referral ASAP  #With regards to progressive disease-I would recommend switching therapies to-docetaxel [first preference]; carboplatin [BRCA2 mutated somatic]; Lonie Peak; amongst others.  # Pain second malignancy-worsened; recommend taking hydrocodone every 8 hours as needed; add Motrin in between; also recommend taking dexamethasone 2 mg at in the evening.  # follow up to be decided; STAT MRI of neck/T-spine.   # I reviewed the blood work- with the patient in detail; also reviewed the imaging independently [as summarized above]; and with the patient in detail.

## 2018-02-11 NOTE — Telephone Encounter (Signed)
Patient apt added at 3pm

## 2018-02-11 NOTE — Telephone Encounter (Signed)
Spoke to pt re: results of MRI; recommend follow up today in clinic at 3 pm. Pt agrees. Recommend evaluation with [facility] ASAP; please make a referral.   I have reached out to [facility] neurosurgery.

## 2018-02-12 ENCOUNTER — Ambulatory Visit
Admission: RE | Admit: 2018-02-12 | Discharge: 2018-02-12 | Disposition: A | Discharge status: Home / Self Care | Payer: Medicare Other | Source: Ambulatory Visit | Attending: Internal Medicine | Admitting: Internal Medicine

## 2018-02-12 ENCOUNTER — Telehealth: Payer: Self-pay | Admitting: Internal Medicine

## 2018-02-12 DIAGNOSIS — M549 Dorsalgia, unspecified: Secondary | ICD-10-CM

## 2018-02-12 DIAGNOSIS — M4854XA Collapsed vertebra, not elsewhere classified, thoracic region, initial encounter for fracture: Secondary | ICD-10-CM | POA: Diagnosis not present

## 2018-02-12 DIAGNOSIS — M542 Cervicalgia: Secondary | ICD-10-CM

## 2018-02-12 DIAGNOSIS — C7951 Secondary malignant neoplasm of bone: Secondary | ICD-10-CM | POA: Insufficient documentation

## 2018-02-12 DIAGNOSIS — C61 Malignant neoplasm of prostate: Secondary | ICD-10-CM

## 2018-02-12 DIAGNOSIS — C801 Malignant (primary) neoplasm, unspecified: Secondary | ICD-10-CM | POA: Diagnosis not present

## 2018-02-12 MED ORDER — GADOBENATE DIMEGLUMINE 529 MG/ML IV SOLN
15.0000 mL | Freq: Once | INTRAVENOUS | Status: AC | PRN
  Administered 2018-02-12: 14 mL via INTRAVENOUS

## 2018-02-12 NOTE — Telephone Encounter (Signed)
I contacted [facility] and spoke with receptionist Patty. She states that the patient has an appt 02/13/18 at 4:00 - and they will contact the patient with this appt.

## 2018-02-12 NOTE — Telephone Encounter (Signed)
Please inform patient that [facility] agreed to see the patient on wed/july 31st. Please give pt [facility]; and make sure he calls to confirm his appt.  Thx.

## 2018-02-13 DIAGNOSIS — M8458XA Pathological fracture in neoplastic disease, other specified site, initial encounter for fracture: Secondary | ICD-10-CM | POA: Diagnosis not present

## 2018-02-13 DIAGNOSIS — C61 Malignant neoplasm of prostate: Secondary | ICD-10-CM | POA: Diagnosis not present

## 2018-02-15 ENCOUNTER — Telehealth: Payer: Self-pay | Admitting: *Deleted

## 2018-02-15 NOTE — Telephone Encounter (Signed)
T/C made to William Wright at Dr. Aletha Halim request to instruct him that he can take Dexamethasone 4mg  po TID over the weekend to see if this helps his pain. Patient had reported earlier that his pain has not improved. He already had a prescription for Dexamethasone 4mg  tablets at home. Patient was able to correctly repeat instructions to take Dexamethasone 4mg  tablet - one table three times daily over the weekend. He is to call me back on Monday to let me know whether this helped his pain. Also instructed patient to take the medication with food and not to take it at night due to potential side effect of sleeplessness. Patient states he with take with meals. [name], BSN, MHA, OCN 02/15/2018 4:27 PM

## 2018-02-18 ENCOUNTER — Other Ambulatory Visit: Payer: Self-pay | Admitting: Internal Medicine

## 2018-02-18 ENCOUNTER — Telehealth: Payer: Self-pay | Admitting: *Deleted

## 2018-02-18 MED ORDER — DEXAMETHASONE 4 MG PO TABS: 4.0000 mg | ORAL_TABLET | Freq: Three times a day (TID) | ORAL | 0 refills | Status: DC

## 2018-02-18 MED ORDER — TRAMADOL HCL 50 MG PO TABS: 50.0000 mg | ORAL_TABLET | Freq: Three times a day (TID) | ORAL | 1 refills | Status: DC | PRN

## 2018-02-18 MED ORDER — DEXAMETHASONE 2 MG PO TABS: 2.0000 mg | ORAL_TABLET | Freq: Two times a day (BID) | ORAL | 0 refills | Status: DC

## 2018-02-18 NOTE — Telephone Encounter (Signed)
Received t/c from patient William Wright stating that he is almost out of Tramadol and Dexamethasone. Patient states taking the Dexamethasone has not completely alleviated his pain, but that he can feel an improvement since he started taking it on Friday. He is calling this morning to request refills. Informed Dr. [name] of the improvement in patient's pain and he states he will approve the refills. Dr. [name] also asks what the plan is and was informed that the patient is waiting for the neurosurgeon to present his case tomorrow and he will contact [name] with his recommendation.[name], BSN, MHA, OCN 02/18/2018 11:47 AM

## 2018-02-18 NOTE — Telephone Encounter (Signed)
T/C made back to William Wright to inform that Dr. [name] has refilled his prescriptions and that they will be at the [facility]. Patient states he will pick them up this afternoon. Also informed [name] that Dr. [name] wants to see him back for follow up on Friday - 02/22/18 at 9:00AM. Patient states he will put this on his calendar now.  [name], BSN, MHA, OCN 02/18/2018 2:43 PM

## 2018-02-18 NOTE — Progress Notes (Signed)
Kay-please inform patient that I have sent 2 prescriptions for dexamethasone by mistake; I would recommend dexamethasone 4 mg 3 times daily.  Continue tramadol.   Also, please inform pt that I would plan to see the patient on this Friday, August 9 at 9 am. No labs.   GB

## 2018-02-20 ENCOUNTER — Other Ambulatory Visit: Payer: Self-pay | Admitting: Internal Medicine

## 2018-02-20 NOTE — Progress Notes (Signed)
William Wright- I spoke to [place]- recommends vertebroplasty + RT; will review at tumor conference tomorrow.   Please inform pt of above;  will discuss further at next appt/this Friday.   Thx GB

## 2018-02-21 ENCOUNTER — Telehealth: Payer: Self-pay | Admitting: *Deleted

## 2018-02-21 NOTE — Telephone Encounter (Signed)
T/C made to William Wright this morning at Dr. Aletha Halim request to inform him of neurosurgeon's recommendation that patient have Vertebroplasty and Radiation therapy. Patient has questions regarding which procedure will be performed first - surgery or radiation, and how many radiation treatments he will require. Patient informed that his case will be discussed today in tumor conference and Dr. [name] will talk with him more about it and hopefully answer his questions at his follow up appointment tomorrow morning. [name], BSN, MHA, OCN 02/21/2018 10:58 AM

## 2018-02-22 ENCOUNTER — Inpatient Hospital Stay: Payer: Medicare Other | Attending: Internal Medicine | Admitting: Internal Medicine

## 2018-02-22 VITALS — BP 149/73 | HR 48 | Temp 97.9°F | Resp 16 | Wt 152.0 lb

## 2018-02-22 DIAGNOSIS — Z79899 Other long term (current) drug therapy: Secondary | ICD-10-CM | POA: Diagnosis not present

## 2018-02-22 DIAGNOSIS — G893 Neoplasm related pain (acute) (chronic): Secondary | ICD-10-CM | POA: Diagnosis not present

## 2018-02-22 DIAGNOSIS — C61 Malignant neoplasm of prostate: Secondary | ICD-10-CM | POA: Insufficient documentation

## 2018-02-22 DIAGNOSIS — Z79818 Long term (current) use of other agents affecting estrogen receptors and estrogen levels: Secondary | ICD-10-CM | POA: Insufficient documentation

## 2018-02-22 DIAGNOSIS — C7651 Malignant neoplasm of right lower limb: Secondary | ICD-10-CM

## 2018-02-22 DIAGNOSIS — C7951 Secondary malignant neoplasm of bone: Secondary | ICD-10-CM | POA: Diagnosis not present

## 2018-02-22 DIAGNOSIS — R918 Other nonspecific abnormal finding of lung field: Secondary | ICD-10-CM

## 2018-02-22 DIAGNOSIS — Z806 Family history of leukemia: Secondary | ICD-10-CM | POA: Diagnosis not present

## 2018-02-22 DIAGNOSIS — M545 Low back pain: Secondary | ICD-10-CM | POA: Insufficient documentation

## 2018-02-22 MED ORDER — GABAPENTIN 100 MG PO CAPS: 100.0000 mg | ORAL_CAPSULE | Freq: Three times a day (TID) | ORAL | 1 refills | Status: DC

## 2018-02-22 NOTE — Progress Notes (Signed)
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    # RLL [~31m lung nodule] s/p Bx- Cryptococcus- ID/Dr.Fitzgerald- on diflucan   # Xgeva  # MOLECULAR TESTING/omniseq- BRCA-2 MUTATED; PDL-1- NEG; MSS; N-TRK-NEG  # GENETICS- BRCA-NEG; hetrozygous MUTHY; recommended family work up --------------------------------------------------    DIAGNOSIS: _0  Metastatic prostate cancer  STAGE: 4    ;GOALS: Palliative  CURRENT/MOST RECENT THERAPY-X-tandi+ Trelstar      Cancer of prostate ([facility]   08/17/2012 Initial Diagnosis    Cancer of prostate, stage II    12/03/2013 Progression       10/13/2014 Progression        Prostate cancer metastatic to multiple sites ([facility]      INTERVAL HISTORY:  William [id number].[id number].  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  [facility]   Patient continues to have worsening lower back pain rating the legs.  Is currently on dexamethasone 3 times a day and also  hydrocodone.  He continues to deny any bladder or bowel incontinence.  Review of Systems  Constitutional: Negative for chills, diaphoresis, fever, malaise/fatigue and weight loss.  HENT: Negative for nosebleeds and sore throat.   Eyes: Negative for double vision.  Respiratory: Negative for cough, hemoptysis, sputum production, shortness of breath and wheezing.   Cardiovascular: Negative for chest pain, palpitations, orthopnea and leg swelling.  Gastrointestinal: Negative for abdominal pain, blood in stool, constipation, diarrhea, heartburn, melena, nausea and vomiting.  Genitourinary: Negative for dysuria, frequency and urgency.  Musculoskeletal: Positive for back pain and myalgias. Negative for joint pain.  Skin: Negative.  Negative for itching and rash.  Neurological: Negative for dizziness, tingling, focal weakness, weakness and headaches.  Endo/Heme/Allergies: Does not bruise/bleed easily.  Psychiatric/Behavioral: Negative for depression. The patient has insomnia. The patient is not nervous/anxious.       PAST MEDICAL HISTORY :  Past Medical History:  Diagnosis Date  . Cancer of prostate ([facility] 07/2012  . Family history of leukemia   . Prostate cancer ([facility]     PAST SURGICAL HISTORY :   Past Surgical History:  Procedure Laterality Date  . EXTRACORPOREAL SHOCK WAVE LITHOTRIPSY Left 05/20/2015   Procedure: EXTRACORPOREAL SHOCK WAVE LITHOTRIPSY (ESWL);  Surgeon: MRoyston Cowper MD;  Location: [facility] ORS;  Service: Urology;  Laterality: Left;  . EXTRACORPOREAL SHOCK WAVE LITHOTRIPSY Right 12/21/2016   Procedure: EXTRACORPOREAL SHOCK WAVE LITHOTRIPSY (ESWL);  Surgeon: WRoyston Cowper MD;  Location: [facility] ORS;  Service: Urology;  Laterality: Right;    FAMILY HISTORY :   Family History  Problem Relation Age of Onset  . Alzheimer's disease  Father   . Lung cancer Maternal Grandfather 65  . Leukemia Paternal Grandmother 21  . COPD Paternal Grandfather   . Cancer Maternal Uncle         type unk, dx 80's    SOCIAL HISTORY:   Social History   Tobacco Use  . Smoking status: Never Smoker  . Smokeless tobacco: Never Used  Substance Use Topics  . Alcohol use: No  . Drug use: No    ALLERGIES:  is allergic to no known allergies.  MEDICATIONS:  Current Outpatient Medications  Medication Sig Dispense Refill  . dexamethasone (DECADRON) 4 MG tablet Take 1 tablet (4 mg total) by mouth 3 (three) times daily. 30 tablet 0  . enzalutamide (XTANDI) 40 MG capsule Take 4 capsules by mouth daily.    Marland Kitchen gabapentin (NEURONTIN) 100 MG capsule Take 1 capsule (100 mg total) by mouth 3 (three) times daily. 60 capsule 1  . HYDROcodone-acetaminophen (NORCO) 5-325 MG tablet Take 1 tablet by mouth at bedtime. For pain (Patient taking differently: Take 1 tablet by mouth every 8 (eight) hours as needed for moderate pain. For pain) 30 tablet 0  . oxybutynin (DITROPAN-XL) 5 MG 24 hr tablet Take 1 tablet (5 mg total) by mouth at bedtime. 90 tablet 3  . tamsulosin (FLOMAX) 0.4 MG CAPS capsule Take 1 capsule (0.4 mg total) by mouth daily. 30 capsule 11  . traMADol (ULTRAM) 50 MG tablet Take 1 tablet (50 mg total) by mouth every 8 (eight) hours as needed. 45 tablet 1  . Triptorelin Pamoate (TRELSTAR) 22.5 MG injection Inject 22.5 mg into the muscle every 6 (six) months.    . valACYclovir (VALTREX) 1000 MG tablet Take 1 tablet (1,000 mg total) by mouth 2 (two) times daily. (Patient not taking: Reported on 02/08/2018) 60 tablet 6   No current facility-administered medications for this visit.     PHYSICAL EXAMINATION: ECOG PERFORMANCE STATUS: 1 - Symptomatic but completely ambulatory  BP (!) 149/73 (BP Location: Left Arm, Patient Position: Sitting)   Pulse (!) 48   Temp 97.9 F (36.6 C)   Resp 16   Wt 152 lb (68.9 kg)   BMI 20.61 kg/m   Filed Weights   02/22/18 0920  Weight: 152 lb (68.9 kg)    GENERAL: Well-nourished well-developed; Alert, no distress and comfortable.  Alone.  EYES: no  pallor or icterus OROPHARYNX: no thrush or ulceration; NECK: supple; no lymph nodes felt. LYMPH:  no palpable lymphadenopathy in the axillary or inguinal regions LUNGS: Decreased breath sounds auscultation bilaterally. No wheeze or crackles HEART/CVS: regular rate & rhythm and no murmurs; No lower extremity edema ABDOMEN:abdomen soft, non-tender and normal bowel sounds. No hepatomegaly or splenomegaly.  Musculoskeletal:no cyanosis of digits and no clubbing  PSYCH: alert & oriented x 3 with fluent speech NEURO: no focal motor/sensory deficits SKIN:  no rashes or significant lesions    LABORATORY DATA:  I have reviewed the data as listed    Component Value Date/Time   NA 138 02/08/2018 1008   NA 137 11/12/2014 1415   K 4.5 02/08/2018 1008   K 4.1 11/12/2014 1415   CL 106 02/08/2018 1008   CL 105 11/12/2014 1415   CO2 24 02/08/2018 1008   CO2 24 11/12/2014 1415   GLUCOSE 99 02/08/2018 1008   GLUCOSE 98 11/12/2014 1415   BUN 21 02/08/2018 1008   BUN 24 (H) 11/12/2014 1415   CREATININE 0.97 02/08/2018 1008   CREATININE 1.02 11/12/2014 1415   CALCIUM 9.3  02/08/2018 1008   CALCIUM 9.2 11/12/2014 1415   PROT 6.9 02/08/2018 1008   PROT 7.4 11/12/2014 1415   ALBUMIN 4.3 02/08/2018 1008   ALBUMIN 4.4 11/12/2014 1415   AST 14 (L) 02/08/2018 1008   AST 23 11/12/2014 1415   ALT 11 02/08/2018 1008   ALT 16 (L) 11/12/2014 1415   ALKPHOS 83 02/08/2018 1008   ALKPHOS 103 11/12/2014 1415   BILITOT 0.7 02/08/2018 1008   BILITOT 0.4 11/12/2014 1415   GFRNONAA >60 02/08/2018 1008   GFRNONAA >60 11/12/2014 1415   GFRAA >60 02/08/2018 1008   GFRAA >60 11/12/2014 1415    No results found for: SPEP, UPEP  Lab Results  Component Value Date   WBC 4.0 02/08/2018   NEUTROABS 2.0 02/08/2018   HGB 14.1 02/08/2018   HCT 41.4 02/08/2018   MCV 96.9 02/08/2018   PLT 223 02/08/2018      Chemistry      Component Value Date/Time   NA 138 02/08/2018 1008   NA 137 11/12/2014 1415   K 4.5  02/08/2018 1008   K 4.1 11/12/2014 1415   CL 106 02/08/2018 1008   CL 105 11/12/2014 1415   CO2 24 02/08/2018 1008   CO2 24 11/12/2014 1415   BUN 21 02/08/2018 1008   BUN 24 (H) 11/12/2014 1415   CREATININE 0.97 02/08/2018 1008   CREATININE 1.02 11/12/2014 1415      Component Value Date/Time   CALCIUM 9.3 02/08/2018 1008   CALCIUM 9.2 11/12/2014 1415   ALKPHOS 83 02/08/2018 1008   ALKPHOS 103 11/12/2014 1415   AST 14 (L) 02/08/2018 1008   AST 23 11/12/2014 1415   ALT 11 02/08/2018 1008   ALT 16 (L) 11/12/2014 1415   BILITOT 0.7 02/08/2018 1008   BILITOT 0.4 11/12/2014 1415       RADIOGRAPHIC STUDIES: I have personally reviewed the radiological images as listed and agreed with the findings in the report. No results found.   ASSESSMENT & PLAN:  Prostate cancer metastatic to multiple sites [facility]) # Castrate resistant prostate cancer-stage IV; on Trelstar; Xtandi.  July 24th 2019 CT scan chest and pelvis/bone scan-stable disease; improvement of the lung nodule right lower lobe [see discussion below]; MRI July 2019 -L2 epidural extension ; L3 nerve root compression suggestive of progressive disease.  PSA- slightly rising ~15.  Worsening.   #Patient's life expectancy was to be measured in years given his excellent performance status; and since patient has not had chemotherapy yet.  His options of treatment still include carboplatin/ PARP inhibitors Portia.Bott ]  # Pain second malignancy-worse; tramadol 50 BID; Hydrocodone 1 qhs.  Dexamethasone 4 mg TID; Add neurontin 100 TID.   # Patient had evaluation with [facility] neurosurgery Dr. Cari Caraway who after review the case at the tumor conference felt patient may benefit from vertebroplasty plus radiation. Patient's case was discussed with Dr. Dena Billet; who feels patient could benefit from ostial cool procedure/vertebroplasty.  We will also make a referral to radiation oncology.  # follow up in 2 weeks/ No labs.      Orders  Placed This Encounter  Procedures  . Ambulatory referral to Interventional Radiology    Referral Priority:   Urgent    Referral Type:   Consultation    Referral Reason:   Specialty Services Required    Requested Specialty:   Interventional Radiology    Number of Visits Requested:   1   All questions were answered. The patient knows to call the clinic  with any problems, questions or concerns.      Cammie Sickle, MD 02/24/2018 4:55 PM

## 2018-02-22 NOTE — Assessment & Plan Note (Addendum)
#  Castrate resistant prostate cancer-stage IV; on Trelstar; Xtandi.  July 24th 2019 CT scan chest and pelvis/bone scan-stable disease; improvement of the lung nodule right lower lobe [see discussion below]; MRI July 2019 -L2 epidural extension ; L3 nerve root compression suggestive of progressive disease.  PSA- slightly rising ~15.  Worsening.   #Patient's life expectancy was to be measured in years given his excellent performance status; and since patient has not had chemotherapy yet.  His options of treatment still include carboplatin/ PARP inhibitors Portia.Bott ]  # Pain second malignancy-worse; tramadol 50 BID; Hydrocodone 1 qhs.  Dexamethasone 4 mg TID; Add neurontin 100 TID.   # Patient had evaluation with [facility] neurosurgery Dr. Cari Caraway who after review the case at the tumor conference felt patient may benefit from vertebroplasty plus radiation. Patient's case was discussed with Dr. Dena Billet; who feels patient could benefit from ostial cool procedure/vertebroplasty.  We will also make a referral to radiation oncology.  # follow up in 2 weeks/ No labs.

## 2018-02-24 ENCOUNTER — Telehealth: Payer: Self-pay | Admitting: Internal Medicine

## 2018-02-24 NOTE — Telephone Encounter (Signed)
Heather/ collete- I put in  A referral to IR for vertebroplasty/osteocool. I had spoken to Dr.Watts. Please follow up to make sure this is scheduled ASAP. Also please make a referral to Dr.Crystal asap.  Thanks, GB

## 2018-02-25 ENCOUNTER — Telehealth: Payer: Self-pay | Admitting: *Deleted

## 2018-02-25 ENCOUNTER — Other Ambulatory Visit: Payer: Self-pay | Admitting: Internal Medicine

## 2018-02-25 MED ORDER — FENTANYL 25 MCG/HR TD PT72: 25.0000 ug | MEDICATED_PATCH | TRANSDERMAL | 0 refills | Status: DC

## 2018-02-25 NOTE — Telephone Encounter (Signed)
Returned t/c to patient Sasha Rueth this morning and he states he is experiencing increased pain to the point that he could not get any relief last night. States he is taking Tramadol and decadron regularly and Tylenol or Ibuprofen in between, but is only taking the Hydrocodone at night. He also states he was notified of an appointment with Dr. Baruch Gouty on Thursday this week, but has not heard from the Neurosurgeon's office regarding vertebroplasty yet. He is concerned about the pain and not being able to get treated right away. Dr. [name] was notified and he states patient needs to take the Hydrocodone three times daily or he is also willing to prescribe a Duragesic/fentanyl patch if the patient is willing to use it. Informed Mr. Mckelvie and he states he will use it because he is hurting so bad. Instructed on use of patch, applying every 3 days to clean dry skin and removing the old patch before applying a new patch, never cutting a patch or using if damaged since the medication is absorbed through the skin. Dr. [name] to send prescription for Duragesic patch to [facility]. [name], BSN, MHA, OCN 02/25/2018 11:20 AM

## 2018-02-26 ENCOUNTER — Other Ambulatory Visit: Payer: Self-pay | Admitting: Internal Medicine

## 2018-02-26 ENCOUNTER — Telehealth: Payer: Self-pay | Admitting: *Deleted

## 2018-02-26 DIAGNOSIS — C61 Malignant neoplasm of prostate: Secondary | ICD-10-CM

## 2018-02-26 NOTE — Telephone Encounter (Signed)
I received a call from [name] today at 13:30.  He was concerned that he had not heard from Dr. Pascal Lux about scheduling his back surgery.  [name] had tried to contact [name] about this earlier today.  [name] was instructed to contact Dr. [name] if Dr. Pascal Lux had not contacted the patient this week and  [name] was expecting a call on Friday, 02/22/18.  Dr. [name] is in the [facility] clinic today, so I called and gave the message to William Wright.  I instructed [name] to call tomorrow if he had not heard from Dr. Pascal Lux by this afternoon. Raynelle Dick, RN BSN "02/26/2018 1:50 PM"

## 2018-02-26 NOTE — Telephone Encounter (Signed)
I contacted Vicky at [place], who states that Dr. Pascal Lux will not be in the office again until September 3rd.  Olegario Shearer states that she can get patient in on Thursday 02/28/18 with Dr. Vernard Gambles. Olegario Shearer states that she will contact the patient now with this appt.

## 2018-02-28 ENCOUNTER — Encounter: Payer: Self-pay | Admitting: Radiology

## 2018-02-28 ENCOUNTER — Other Ambulatory Visit: Payer: Self-pay

## 2018-02-28 ENCOUNTER — Ambulatory Visit
Admission: RE | Admit: 2018-02-28 | Discharge: 2018-02-28 | Disposition: A | Discharge status: Home / Self Care | Payer: Medicare Other | Source: Ambulatory Visit | Attending: Radiation Oncology | Admitting: Radiation Oncology

## 2018-02-28 ENCOUNTER — Telehealth: Payer: Self-pay | Admitting: *Deleted

## 2018-02-28 ENCOUNTER — Ambulatory Visit
Admission: RE | Admit: 2018-02-28 | Discharge: 2018-02-28 | Disposition: A | Discharge status: Home / Self Care | Payer: Medicare Other | Source: Ambulatory Visit | Attending: Internal Medicine | Admitting: Internal Medicine

## 2018-02-28 VITALS — BP 143/67 | HR 52 | Temp 97.5°F | Resp 16 | Wt 153.3 lb

## 2018-02-28 DIAGNOSIS — C61 Malignant neoplasm of prostate: Secondary | ICD-10-CM

## 2018-02-28 DIAGNOSIS — Z801 Family history of malignant neoplasm of trachea, bronchus and lung: Secondary | ICD-10-CM | POA: Insufficient documentation

## 2018-02-28 DIAGNOSIS — Z79899 Other long term (current) drug therapy: Secondary | ICD-10-CM | POA: Insufficient documentation

## 2018-02-28 DIAGNOSIS — J449 Chronic obstructive pulmonary disease, unspecified: Secondary | ICD-10-CM | POA: Insufficient documentation

## 2018-02-28 DIAGNOSIS — C7951 Secondary malignant neoplasm of bone: Secondary | ICD-10-CM | POA: Insufficient documentation

## 2018-02-28 DIAGNOSIS — Z806 Family history of leukemia: Secondary | ICD-10-CM | POA: Insufficient documentation

## 2018-02-28 DIAGNOSIS — G893 Neoplasm related pain (acute) (chronic): Secondary | ICD-10-CM | POA: Diagnosis not present

## 2018-02-28 HISTORY — PX: IR RADIOLOGIST EVAL & MGMT: IMG5224

## 2018-02-28 NOTE — Consult Note (Signed)
Chief Complaint: Patient was seen in consultation today for  Chief Complaint  Patient presents with  . Consult    Consult for Vertebroplasty/OsteoCool   at the request of Brahmanday,Govinda R  Referring Physician(s): Brahmanday,Govinda R  History of Present Illness: William Wright is a 67 y.o. male  referred for consultation regarding possible osteo-cool.  Patient has a history of metastatic prostate carcinoma.  He is known to me from previous CT-guided core biopsy of a lung lesion in February of this year, which turned out to be a granuloma and not metastatic disease.  The patient describes about 2 months of worsening low back pain radiating into bilateral lower extremities.  There is no inciting accident, fall, or medication change.  The lower extremity component dominates over the low back pain.  Laterality is variable.  He describes the pain is aching, fire, shooting pain.  There is no definite correlation to particular positioning or activity.  He thinks it may be slightly worse when he is walking.  He thinks it is probably better when he is in bed, but sometimes the pain does wake him up at night.  He rates the pain 8-9 out of 10 on the visual analog pain scale at its worst, typically in the 4-5 range, never 0.  He is using a fentanyl patch, tramadol, dexamethasone, and Tylenol, feels that this really does not help with the pain at all.  He is able to ambulate without assistance. His prostate cancer was initially diagnosed in February 2014.  He was noted to have extensive osseous metastatic disease in May 2015.  He is on long-term Xtandi.  Previous imaging is shown fairly stable sclerotic osseous metastatic disease in the thoracic and lumbar spine.  There is some chronic compression deformities but no acute findings on recent imaging.  On MR from last month, there is some epidural extension at the L3 level without high-grade spinal stenosis.  Past Medical History:  Diagnosis Date    . Cancer of prostate ([place]) 07/2012  . Family history of leukemia   . Prostate cancer [facility])     Past Surgical History:  Procedure Laterality Date  . EXTRACORPOREAL SHOCK WAVE LITHOTRIPSY Left 05/20/2015   Procedure: EXTRACORPOREAL SHOCK WAVE LITHOTRIPSY (ESWL);  Surgeon: Royston Cowper, MD;  Location: [facility] ORS;  Service: Urology;  Laterality: Left;  . EXTRACORPOREAL SHOCK WAVE LITHOTRIPSY Right 12/21/2016   Procedure: EXTRACORPOREAL SHOCK WAVE LITHOTRIPSY (ESWL);  Surgeon: Royston Cowper, MD;  Location: [facility] ORS;  Service: Urology;  Laterality: Right;    Allergies: No known allergies  Medications: Prior to Admission medications   Medication Sig Start Date End Date Taking? Authorizing Provider  dexamethasone (DECADRON) 4 MG tablet Take 1 tablet (4 mg total) by mouth 3 (three) times daily. 02/18/18  Yes Cammie Sickle, MD  enzalutamide Gillermina Phy) 40 MG capsule Take 4 capsules by mouth daily. 09/24/17  Yes [provider]  fentaNYL (DURAGESIC - DOSED MCG/HR) 25 MCG/HR patch Place 1 patch (25 mcg total) onto the skin every 3 (three) days. 02/25/18  Yes Cammie Sickle, MD  oxybutynin (DITROPAN-XL) 5 MG 24 hr tablet Take 1 tablet (5 mg total) by mouth at bedtime. 01/11/18  Yes Cammie Sickle, MD  tamsulosin (FLOMAX) 0.4 MG CAPS capsule Take 1 capsule (0.4 mg total) by mouth daily. 01/18/18  Yes Cammie Sickle, MD  traMADol (ULTRAM) 50 MG tablet Take 1 tablet (50 mg total) by mouth every 8 (eight) hours as needed. 02/18/18  Yes Cammie Sickle, MD  Triptorelin Pamoate (TRELSTAR) 22.5 MG injection Inject 22.5 mg into the muscle every 6 (six) months.   Yes [provider]  valACYclovir (VALTREX) 1000 MG tablet Take 1 tablet (1,000 mg total) by mouth 2 (two) times daily. 08/02/16  Yes Cammie Sickle, MD  gabapentin (NEURONTIN) 100 MG capsule Take 1 capsule (100 mg total) by mouth 3 (three) times daily. Patient not taking: Reported on 02/28/2018 02/22/18    Cammie Sickle, MD  HYDROcodone-acetaminophen (NORCO) 5-325 MG tablet Take 1 tablet by mouth at bedtime. For pain Patient not taking: Reported on 02/28/2018 02/05/18   Cammie Sickle, MD     Family History  Problem Relation Age of Onset  . Alzheimer's disease Father   . Lung cancer Maternal Grandfather 6  . Leukemia Paternal Grandmother 68  . COPD Paternal Grandfather   . Cancer Maternal Uncle        type unk, dx 87's    Social History   Socioeconomic History  . Marital status: Married    Spouse name: Not on file  . Number of children: Not on file  . Years of education: Not on file  . Highest education level: Not on file  Occupational History  . Not on file  Social Needs  . Financial resource strain: Not on file  . Food insecurity:    Worry: Not on file    Inability: Not on file  . Transportation needs:    Medical: Not on file    Non-medical: Not on file  Tobacco Use  . Smoking status: Never Smoker  . Smokeless tobacco: Never Used  Substance and Sexual Activity  . Alcohol use: No  . Drug use: No  . Sexual activity: Not on file  Lifestyle  . Physical activity:    Days per week: Not on file    Minutes per session: Not on file  . Stress: Not on file  Relationships  . Social connections:    Talks on phone: Not on file    Gets together: Not on file    Attends religious service: Not on file    Active member of club or organization: Not on file    Attends meetings of clubs or organizations: Not on file    Relationship status: Not on file  Other Topics Concern  . Not on file  Social History Narrative  . Not on file    ECOG Status: 1 - Symptomatic but completely ambulatory    Review of Systems Review of Systems: A 12 point ROS discussed and pertinent positives are indicated in the HPI above.  All other systems are negative.  Physical Exam Vital Signs: BP 134/67   Pulse (!) 59   Temp 98.1 F (36.7 C) (Oral)   Resp 14   Ht 6' (1.829 m)    Wt 68 kg   SpO2 100%   BMI 20.34 kg/m   Constitutional: Oriented to person, place, and time. Well-developed and well-nourished. No distress.  Last Weight  Most recent update: 02/28/2018  8:05 AM   Weight  68 kg (150 lb)           HENT:  Head: Normocephalic and atraumatic.  Eyes: Conjunctivae and EOM are normal. Right eye exhibits no discharge. Left eye exhibits no discharge. No scleral icterus.  Neck: No JVD present.  Pulmonary/Chest: Effort normal. No stridor. No respiratory distress.  Abdomen: soft, non distended Neurological:  alert and oriented to person, place, and time.  Skin: Skin is warm and dry.  not diaphoretic.  Psychiatric:   normal mood and affect.   behavior is normal. Judgment and thought content normal.     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 Right lower lobe biopsy 09/13/2017 demonstrated noncaseating granulomatous inflammation with yeast forms suggestive of cryptococcus. EXAM: CT CHEST, ABDOMEN, AND PELVIS WITH CONTRAST TECHNIQUE: Multidetector CT imaging of the chest, abdomen and pelvis was performed following the standard protocol during bolus administration of intravenous contrast. CONTRAST:  143mL ISOVUE-300 IOPAMIDOL (ISOVUE-300) INJECTION 61% COMPARISON:  08/22/2017 CT chest, abdomen and pelvis. FINDINGS: CT CHEST FINDINGS Cardiovascular: Normal heart size. No significant pericardial effusion/thickening. Left anterior descending coronary atherosclerosis. Aneurysmal 4.6 cm ascending thoracic aorta, stable. Normal caliber pulmonary arteries. No central pulmonary emboli. Mediastinum/Nodes: No discrete thyroid nodules. Unremarkable esophagus. No pathologically enlarged axillary, mediastinal or hilar lymph nodes. Lungs/Pleura: No pneumothorax. No pleural effusion. No acute consolidative airspace disease or lung masses. Solid  peripheral right lower lobe 1.1 x 0.7 cm pulmonary nodule (series 4/image 99) is decreased from 1.5 x 1.0 cm. Posterior left upper lobe 0.3 cm subpleural solid pulmonary nodule (series 4/image 105) is stable since at least 06/30/2016 CT, considered benign. No new significant pulmonary nodules. Stable scattered parenchymal bands in the mid to lower lungs bilaterally compatible with postinfectious/postinflammatory scarring. Musculoskeletal: No appreciable change in widespread sclerotic osseous lesions throughout the thoracic skeleton, including the right scapula, bilateral ribs and thoracic spine. No appreciable new thoracic osseous lesions. Stable mild chronic pathologic vertebral fractures at T6, T10 and T12. Mild thoracic spondylosis. CT ABDOMEN PELVIS FINDINGS Hepatobiliary: Normal liver with no liver mass. Normal gallbladder with no radiopaque cholelithiasis. No biliary ductal dilatation. Pancreas: Normal, with no mass or duct dilation. Spleen: Normal size. No mass. Adrenals/Urinary Tract: Normal adrenals. Normal kidneys with no hydronephrosis and no renal mass. Normal nondistended bladder. Stomach/Bowel: Small hiatal hernia. Otherwise normal nondistended stomach. Normal caliber small bowel with no small bowel wall thickening. Normal appendix. Mild sigmoid diverticulosis, with no large bowel wall thickening or significant pericolonic fat stranding. Vascular/Lymphatic: Normal caliber abdominal aorta. Patent portal, splenic, hepatic and renal veins. No pathologically enlarged lymph nodes in the abdomen or pelvis. Reproductive: Stable asymmetric patchy hyperenhancement throughout the left prostate. Other: No pneumoperitoneum, ascites or focal fluid collection. Musculoskeletal: Stable widespread sclerotic osseous lesions throughout the lumbar spine, left upper sacrum and bilateral pelvic girdle. No new focal osseous lesions. Mild lumbar spondylosis. IMPRESSION: 1. No findings suspicious for extra osseous metastatic  disease. 2. Right lower lobe pulmonary nodule is decreased in size, compatible with partial response to therapy of biopsy-proven fungal infection. 3. Stable widespread sclerotic osseous metastases throughout the axial skeleton. 4. Stable 4.6 cm ascending thoracic aortic aneurysm. Ascending thoracic aortic aneurysm. Recommend semi-annual imaging followup by CTA or MRA and referral to cardiothoracic surgery if not already obtained. This recommendation follows 2010 ACCF/AHA/AATS/ACR/ASA/SCA/SCAI/SIR/STS/SVM Guidelines for the Diagnosis and Management of Patients With Thoracic Aortic Disease. Circulation. 2010; 121: F751-W258. Electronically Signed   By: Ilona Sorrel M.D.   On: 02/06/2018 14:58   Mr Cervical Spine W Wo Contrast  Result Date: 02/12/2018 CLINICAL DATA:  Metastatic disease to the spine with extraosseous tumor in the lumbar region. EXAM: MRI CERVICAL AND THORACIC SPINE WITHOUT AND WITH CONTRAST TECHNIQUE: Multiplanar and multiecho pulse sequences of the cervical and thoracic spine were obtained without and with intravenous contrast. CONTRAST:  46mL MULTIHANCE GADOBENATE  DIMEGLUMINE 529 MG/ML IV SOLN COMPARISON:  Bone scan 6 days ago.  Chest CT 02/06/2018 FINDINGS: MRI CERVICAL SPINE FINDINGS Alignment: Normal Vertebrae: Sclerotic focus in the posterior C7 body consistent with metastatic disease in this setting. No extra osseous tumor. C2-3 non segmentation Cord: Normal signal and morphology Posterior Fossa, vertebral arteries, paraspinal tissues: No noted adenopathy or mass. Disc levels: C2-3: Non segmentation C3-4: Left uncovertebral spurring and small disc protrusion with mild left foraminal narrowing C4-5: Small central disc protrusion, noncompressive C5-6: Shallow central disc protrusion. Negative facets. No impingement C6-7: Disc narrowing and bulging. Mild bilateral foraminal narrowing. C7-T1:Unremarkable. MRI THORACIC SPINE FINDINGS Alignment:  Exaggerated thoracic kyphosis Vertebrae: Metastatic  disease throughout the T6 body, to a mild degree in the T7 body, moderately seen in the T10 body, and extensive in the T12 body. Remote appearing pathologic fracture at T6, T10 superior endplate, and T12 inferior endplate. The right-sided body lesion at T12 bulges the cortex without visible epidural tumor on postcontrast axial slices. No epidural or intrathecal tumor seen throughout the thoracic spine. Lumbar spine tumor with extra osseous disease described on dedicated report. Cord: Normal signal Paraspinal and other soft tissues: No evident mass Disc levels: No significant degenerative changes or impingement. IMPRESSION: Cervical MRI: Small focus of osseous metastatic disease at C7. No extra osseous tumor or fracture. Thoracic spine: 1. Metastatic disease in the T6, T7, T10, and T12 vertebrae. The T12 tumor causes cortical bowing without extraosseous/epidural tumor infiltration. 2. Remote pathologic fractures of T6, T10, and T12. Electronically Signed   By: Monte Fantasia M.D.   On: 02/12/2018 11:07   Mr Thoracic Spine W Wo Contrast  Result Date: 02/12/2018 CLINICAL DATA:  Metastatic disease to the spine with extraosseous tumor in the lumbar region. EXAM: MRI CERVICAL AND THORACIC SPINE WITHOUT AND WITH CONTRAST TECHNIQUE: Multiplanar and multiecho pulse sequences of the cervical and thoracic spine were obtained without and with intravenous contrast. CONTRAST:  49mL MULTIHANCE GADOBENATE DIMEGLUMINE 529 MG/ML IV SOLN COMPARISON:  Bone scan 6 days ago.  Chest CT 02/06/2018 FINDINGS: MRI CERVICAL SPINE FINDINGS Alignment: Normal Vertebrae: Sclerotic focus in the posterior C7 body consistent with metastatic disease in this setting. No extra osseous tumor. C2-3 non segmentation Cord: Normal signal and morphology Posterior Fossa, vertebral arteries, paraspinal tissues: No noted adenopathy or mass. Disc levels: C2-3: Non segmentation C3-4: Left uncovertebral spurring and small disc protrusion with mild left  foraminal narrowing C4-5: Small central disc protrusion, noncompressive C5-6: Shallow central disc protrusion. Negative facets. No impingement C6-7: Disc narrowing and bulging. Mild bilateral foraminal narrowing. C7-T1:Unremarkable. MRI THORACIC SPINE FINDINGS Alignment:  Exaggerated thoracic kyphosis Vertebrae: Metastatic disease throughout the T6 body, to a mild degree in the T7 body, moderately seen in the T10 body, and extensive in the T12 body. Remote appearing pathologic fracture at T6, T10 superior endplate, and T12 inferior endplate. The right-sided body lesion at T12 bulges the cortex without visible epidural tumor on postcontrast axial slices. No epidural or intrathecal tumor seen throughout the thoracic spine. Lumbar spine tumor with extra osseous disease described on dedicated report. Cord: Normal signal Paraspinal and other soft tissues: No evident mass Disc levels: No significant degenerative changes or impingement. IMPRESSION: Cervical MRI: Small focus of osseous metastatic disease at C7. No extra osseous tumor or fracture. Thoracic spine: 1. Metastatic disease in the T6, T7, T10, and T12 vertebrae. The T12 tumor causes cortical bowing without extraosseous/epidural tumor infiltration. 2. Remote pathologic fractures of T6, T10, and T12. Electronically Signed   By:  Monte Fantasia M.D.   On: 02/12/2018 11:07   Mr Lumbar Spine W Wo Contrast  Result Date: 02/08/2018 CLINICAL DATA:  Metastatic prostate cancer. Low back and upper leg pain for 1 month. EXAM: MRI LUMBAR SPINE WITHOUT AND WITH CONTRAST TECHNIQUE: Multiplanar and multiecho pulse sequences of the lumbar spine were obtained without and with intravenous contrast. CONTRAST:  95mL MULTIHANCE GADOBENATE DIMEGLUMINE 529 MG/ML IV SOLN COMPARISON:  Bone scan February 06, 2018 and CT chest, abdomen and pelvis February 06, 2018 and CT abdomen and pelvis September 22, 2016. FINDINGS: SEGMENTATION: For the purposes of this report, the last well-formed intervertebral  disc is reported as L5-S1. ALIGNMENT: Maintained lumbar lordosis. No malalignment. VERTEBRAE: Low T1, low T2 and heterogeneous STIR signal lesions T12, L2 through L5 and included sacrum and RIGHT iliac bone. Expanded tumor RIGHT T12 standing into the pedicle with associated mild (less than 25%) inferior endplate pathologic fracture, old. Ventral epidural extension of tumor at L2 extending 5 mm AP. Old moderate L3 compression fracture with 50-75% central height loss, likely pathologic. Remaining lumbar vertebral bodies intact. Intervertebral disc heights preserved with mild desiccation and minimal edema L2-3. Heterogeneous enhancement L3 seen with tumor and hemangioma. Generally bright T1 bone marrow signal in sacrum consistent with post radiation change. CONUS MEDULLARIS AND CAUDA EQUINA: Conus medullaris terminates at L1-2 and demonstrates normal morphology and signal characteristics. Cauda equina is normal. No abnormal cord, leptomeningeal or epidural enhancement. PARASPINAL AND OTHER SOFT TISSUES: Included prevertebral and paraspinal soft tissues are non suspicious. DISC LEVELS: T12-L1: No disc bulge or canal stenosis. Tumoral expansion of the RIGHT pedicle resulting in moderate RIGHT neural foraminal narrowing and may affect the traversing RIGHT L1 nerve. L2-3: Annular bulging in enhancing annular fissure. Mild facet arthropathy and ligamentum flavum redundancy without canal stenosis or neural foraminal narrowing. Tumor within the epidural space ventrally and laterally with mild mass effect on the thecal sac, likely affecting the traversing L3 nerves bilaterally. L3-4: Annular bulging. Mild facet arthropathy and ligamentum flavum redundancy. Mild canal stenosis. Mild LEFT neural foraminal narrowing. L4-5: Annular bulging. No canal stenosis. Mild LEFT neural foraminal narrowing. L5-S1: Small broad-based disc bulge. Enhancing annular fissure. Mild LEFT facet arthropathy. No canal stenosis. No neural foraminal  narrowing. IMPRESSION: 1. Multilevel osseous metastasis with old mild T12 and moderate L3 pathologic fractures. 2. Epidural tumoral extension L2 likely affects the traversing L3 nerves. 3. No canal stenosis. Neural foraminal narrowing T12-L1, L3-4 and L4-5: Moderate on the RIGHT at T12-L1 due to tumor expansion of the pedicle. Electronically Signed   By: Elon Alas M.D.   On: 02/08/2018 22:01   Mr Pelvis W Wo Contrast  Result Date: 02/08/2018 CLINICAL DATA:  History of prostate cancer. Low back pain with bilateral upper leg pain for 1 month. No known injury. EXAM: MRI PELVIS WITHOUT AND WITH CONTRAST TECHNIQUE: Multiplanar multisequence MR imaging of the pelvis was performed both before and after administration of intravenous contrast. CONTRAST:  61mL MULTIHANCE GADOBENATE DIMEGLUMINE 529 MG/ML IV SOLN COMPARISON:  CT abdomen and pelvis 02/06/2018 FINDINGS: Urinary Tract: Bladder wall is diffusely thickened possibly indicating cystitis. No discrete filling defects identified. Bowel: Visualized portions of colon and small bowel in the pelvis are not abnormally distended and no wall thickening is appreciated. Vascular/Lymphatic: No iliac artery aneurysms. No significant lymphadenopathy. Reproductive: Nodular prominence of the prostate gland. Prostate impression on the bladder base. Other: No free fluid demonstrated in the pelvis. Pelvic musculature appears intact. Musculoskeletal: Multiple focal areas of sclerosis demonstrated in the  sacrum and pelvis consistent with known sclerotic bone metastases. IMPRESSION: Multiple sclerotic bone metastases demonstrated in the sacrum and pelvis. Nodular enlargement of the prostate gland. Electronically Signed   By: Lucienne Capers M.D.   On: 02/08/2018 22:21   Nm Bone Scan Whole Body  Result Date: 02/06/2018 CLINICAL DATA:  Prostate malignancy. One month of low back pain and upper leg pain without recent falls or other trauma. EXAM: NUCLEAR MEDICINE WHOLE BODY  BONE SCAN TECHNIQUE: Whole body anterior and posterior images were obtained approximately 3 hours after intravenous injection of radiopharmaceutical. RADIOPHARMACEUTICALS:  22.52 mCi Technetium-19m MDP IV COMPARISON:  Nuclear bone scan of August 22, 2017 FINDINGS: There is adequate uptake of the radiopharmaceutical by the skeleton. There is adequate soft tissue clearance and renal activity. There is persistent abnormal uptake associated with the right shoulder centered in the glenoid. There is stable mildly increased uptake within the posterior aspect of the right seventh rib. There is persistent increased uptake in the posterior aspects of the lower thoracic spine. Intensely increased uptake is present in the upper and mid lumbar spine similar to that seen previously. There is a stable focus of increased uptake in the medial aspect of the right iliac crest. There is no abnormal uptake observed in the lower extremities. IMPRESSION: No new foci of abnormal skeletal uptake are observed. Findings compatible with metastatic disease to the right glenoid, lower thoracic lumbar spine, and pelvis appear stable. Electronically Signed   By: David  Martinique M.D.   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 Right lower lobe biopsy 09/13/2017 demonstrated noncaseating granulomatous inflammation with yeast forms suggestive of cryptococcus. EXAM: CT CHEST, ABDOMEN, AND PELVIS WITH CONTRAST TECHNIQUE: Multidetector CT imaging of the chest, abdomen and pelvis was performed following the standard protocol during bolus administration of intravenous contrast. CONTRAST:  139mL ISOVUE-300 IOPAMIDOL (ISOVUE-300) INJECTION 61% COMPARISON:  08/22/2017 CT chest, abdomen and pelvis. FINDINGS: CT CHEST FINDINGS Cardiovascular: Normal heart size. No significant pericardial  effusion/thickening. Left anterior descending coronary atherosclerosis. Aneurysmal 4.6 cm ascending thoracic aorta, stable. Normal caliber pulmonary arteries. No central pulmonary emboli. Mediastinum/Nodes: No discrete thyroid nodules. Unremarkable esophagus. No pathologically enlarged axillary, mediastinal or hilar lymph nodes. Lungs/Pleura: No pneumothorax. No pleural effusion. No acute consolidative airspace disease or lung masses. Solid peripheral right lower lobe 1.1 x 0.7 cm pulmonary nodule (series 4/image 99) is decreased from 1.5 x 1.0 cm. Posterior left upper lobe 0.3 cm subpleural solid pulmonary nodule (series 4/image 105) is stable since at least 06/30/2016 CT, considered benign. No new significant pulmonary nodules. Stable scattered parenchymal bands in the mid to lower lungs bilaterally compatible with postinfectious/postinflammatory scarring. Musculoskeletal: No appreciable change in widespread sclerotic osseous lesions throughout the thoracic skeleton, including the right scapula, bilateral ribs and thoracic spine. No appreciable new thoracic osseous lesions. Stable mild chronic pathologic vertebral fractures at T6, T10 and T12. Mild thoracic spondylosis. CT ABDOMEN PELVIS FINDINGS Hepatobiliary: Normal liver with no liver mass. Normal gallbladder with no radiopaque cholelithiasis. No biliary ductal dilatation. Pancreas: Normal, with no mass or duct dilation. Spleen: Normal size. No mass. Adrenals/Urinary Tract: Normal adrenals. Normal kidneys with no hydronephrosis and no renal mass. Normal nondistended bladder. Stomach/Bowel: Small hiatal hernia. Otherwise normal nondistended stomach. Normal caliber small bowel with no small bowel wall thickening. Normal appendix. Mild sigmoid diverticulosis, with no large bowel wall thickening or significant pericolonic  fat stranding. Vascular/Lymphatic: Normal caliber abdominal aorta. Patent portal, splenic, hepatic and renal veins. No pathologically enlarged  lymph nodes in the abdomen or pelvis. Reproductive: Stable asymmetric patchy hyperenhancement throughout the left prostate. Other: No pneumoperitoneum, ascites or focal fluid collection. Musculoskeletal: Stable widespread sclerotic osseous lesions throughout the lumbar spine, left upper sacrum and bilateral pelvic girdle. No new focal osseous lesions. Mild lumbar spondylosis. IMPRESSION: 1. No findings suspicious for extra osseous metastatic disease. 2. Right lower lobe pulmonary nodule is decreased in size, compatible with partial response to therapy of biopsy-proven fungal infection. 3. Stable widespread sclerotic osseous metastases throughout the axial skeleton. 4. Stable 4.6 cm ascending thoracic aortic aneurysm. Ascending thoracic aortic aneurysm. Recommend semi-annual imaging followup by CTA or MRA and referral to cardiothoracic surgery if not already obtained. This recommendation follows 2010 ACCF/AHA/AATS/ACR/ASA/SCA/SCAI/SIR/STS/SVM Guidelines for the Diagnosis and Management of Patients With Thoracic Aortic Disease. Circulation. 2010; 121: I097-D532. Electronically Signed   By: Ilona Sorrel M.D.   On: 02/06/2018 14:58    Labs:  CBC: Recent Labs    11/15/17 0956 12/13/17 1007 01/11/18 0947 02/08/18 1008  WBC 4.0 3.8 3.9 4.0  HGB 14.2 12.9* 13.9 14.1  HCT 40.3 37.3* 40.3 41.4  PLT 219 204 231 223    COAGS: Recent Labs    09/06/17 0940 09/13/17 0949  INR 0.96 0.94  APTT 28  --     BMP: Recent Labs    11/15/17 0956 12/13/17 1007 01/11/18 0947 02/08/18 1008  NA 139 135 140 138  K 4.0 4.3 4.1 4.5  CL 104 102 107 106  CO2 24 24 26 24   GLUCOSE 93 103* 91 99  BUN 14 16 20 21   CALCIUM 9.6 9.0 9.4 9.3  CREATININE 1.05 0.96 0.96 0.97  GFRNONAA >60 >60 >60 >60  GFRAA >60 >60 >60 >60    LIVER FUNCTION TESTS: Recent Labs    11/15/17 0956 12/13/17 1007 01/11/18 0947 02/08/18 1008  BILITOT 0.7 0.6 0.7 0.7  AST 18 18 17  14*  ALT 9* 9* 9 11  ALKPHOS 81 67 77 83  PROT  7.2 6.7 6.8 6.9  ALBUMIN 4.2 4.0 4.1 4.3    TUMOR MARKERS: No results for input(s): AFPTM, CEA, CA199, CHROMGRNA in the last 8760 hours.  Assessment and Plan:  My impression is that there is no acute or subacute compression fracture deformity, pathologic or otherwise, on his recent imaging.  He is not a candidate for vertebroplasty or kyphoplasty.   I reviewed with the patient previous cross-sectional imaging over the past 2 years including multiple CT scans as well as the most recent MRI series.  I pointed out the sclerotic osseous metastatic disease and chronic compression deformities.  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 The sclerotic metastatic disease and old compression deformities are stable compared to examinations going back to 01/12/2016. His symptoms are not classic for compression fracture deformity.  The epidural tumor extension seen on MR at L2 is of indeterminate age since we do not have any previous lumbar MRIs, and this is not well seen on CT.  This may account for his symptoms.  This would not be approachable for percutaneous RF ablation due to its epidural placement adjacent to the thecal sac and nerve roots.  He is scheduled to see Berton Mount in radiation oncology who may have something to offer.  He has recently consulted with neurosurgery,  declined surgical intervention.   If this is not confirmed to be the site of his  pain, neurology evaluation for possible neuropathy would be an additional consideration.  Thank you for this interesting consult.  I greatly enjoyed meeting TAKERU BOSE and look forward to participating in their care.  A copy of this report was sent to the requesting provider on this date.  Electronically Signed: Rickard Rhymes 02/28/2018, 9:07 AM   I spent a total of  30 Minutes   in face to face in clinical consultation, greater than 50% of which was counseling/coordinating care for low back and bilateral lower extremity pain in the  setting of osseous metastatic disease.

## 2018-02-28 NOTE — Addendum Note (Signed)
Encounter addended by: Noreene Filbert, MD on: 02/28/2018 11:18 AM  Actions taken: LOS modified

## 2018-02-28 NOTE — Telephone Encounter (Signed)
Patient William Wright called earlier today to report that he has seen Dr. Vernard Gambles and Dr. Baruch Gouty this morning and feels he was told two conflicting stories about his pain. Dr. Vernard Gambles told him his cancer is stable and that he reviewed the patient's scans for the past two years and nothing has changed. He informed [name] that he is not a candidate for vertebroplasty. Dr. Baruch Gouty also saw the patient and told him his pain is from the cancer and that he needs radiation therapy to treat this. He has an appointment for RT simulation next week. Patient verified that he is using the Duragesic patch and still taking Tramadol and dexamethasone 3 times daily and is using Tylenol in between to manage his pain. The pain is worse at time to the point that he experiences difficulty walking; and better at times - but never completely alleviated. Dr. [name] was informed of the patient's call voicing concern and confusion over the conflicting opinions. Dr. [name] states he will investigate and review Dr. Adron Bene consult note and will contact [name] in the morning to discuss this. Patient was called back and informed of this and allowed to further ventilate his frustration. [name], BSN, MHA, OCN 02/28/2018 3:19 PM

## 2018-02-28 NOTE — Consult Note (Signed)
NEW PATIENT EVALUATION  Name: William Wright  MRN: [id number]  Date:   02/28/2018     DOB: Feb 21, 1951   This 67 y.o. 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  REFERRING PHYSICIAN: No ref. provider found  CHIEF COMPLAINT:  Chief Complaint  Patient presents with  . Cancer    Pt is here for initial consultation of bone mets    DIAGNOSIS: The encounter diagnosis was Prostate cancer metastatic to multiple sites [facility]).   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 He's been having increasing lower back pain bone scan shows significant burden in his lumbar spine.he does also extensive disease in the cervical thoracic spine and lumbar spine shows multilevel osseous missed metastatic disease in T12-L5. There is epidural tumor extension at L2 which likely affects the transversing L3 nerves. He is ambulating well has having no focal neurologic deficits. I been asked to evaluate the patient for palliative radiation therapy.  PLANNED TREATMENT REGIMEN: palliative radiation therapy from T12-L5  PAST MEDICAL HISTORY:  has a past medical history of Cancer of prostate ([place]) (07/2012), Family history of leukemia, and Prostate cancer ([place]).    PAST SURGICAL HISTORY:  Past Surgical History:  Procedure Laterality Date  . EXTRACORPOREAL SHOCK WAVE LITHOTRIPSY Left 05/20/2015   Procedure: EXTRACORPOREAL SHOCK WAVE LITHOTRIPSY (ESWL);  Surgeon:  Royston Cowper, MD;  Location: [facility] ORS;  Service: Urology;  Laterality: Left;  . EXTRACORPOREAL SHOCK WAVE LITHOTRIPSY Right 12/21/2016   Procedure: EXTRACORPOREAL SHOCK WAVE LITHOTRIPSY (ESWL);  Surgeon: Royston Cowper, MD;  Location: [facility] ORS;  Service: Urology;  Laterality: Right;  . IR RADIOLOGIST EVAL & MGMT  02/28/2018    FAMILY HISTORY: family history includes Alzheimer's disease in his father; COPD in his paternal grandfather; Cancer in his maternal uncle; Leukemia (age of onset: 46) in his paternal grandmother; Lung cancer (age of onset: 60) in his maternal grandfather.  SOCIAL HISTORY:  reports that he has never smoked. He has never used smokeless tobacco. He reports that he does not drink alcohol or use drugs.  ALLERGIES: No known allergies  MEDICATIONS:  Current Outpatient Medications  Medication Sig Dispense Refill  . dexamethasone (DECADRON) 4 MG tablet Take 1 tablet (4 mg total) by mouth 3 (three) times daily. 30 tablet 0  . enzalutamide (XTANDI) 40 MG capsule Take 4 capsules by mouth daily.    . fentaNYL (DURAGESIC - DOSED MCG/HR) 25 MCG/HR patch Place 1 patch (25 mcg total) onto the skin every 3 (three) days. 5 patch 0  . oxybutynin (DITROPAN-XL) 5 MG 24 hr tablet Take 1 tablet (5 mg total) by mouth at bedtime. 90 tablet 3  . tamsulosin (FLOMAX) 0.4 MG CAPS capsule Take 1 capsule (0.4 mg total) by mouth daily. 30 capsule 11  . traMADol (ULTRAM) 50 MG tablet Take 1 tablet (50 mg total) by mouth every 8 (eight) hours as needed. 45 tablet 1  . Triptorelin Pamoate (TRELSTAR) 22.5 MG injection Inject 22.5 mg into  the muscle every 6 (six) months.    . valACYclovir (VALTREX) 1000 MG tablet Take 1 tablet (1,000 mg total) by mouth 2 (two) times daily. (Patient taking differently: Take 1,000 mg by mouth 2 (two) times daily as needed. ) 60 tablet 6   No current facility-administered medications for this encounter.     ECOG PERFORMANCE STATUS:  1 - Symptomatic but completely  ambulatory  REVIEW OF SYSTEMS:  Patient denies any weight loss, fatigue, weakness, fever, chills or night sweats. Patient denies any loss of vision, blurred vision. Patient denies any ringing  of the ears or hearing loss. No irregular heartbeat. Patient denies heart murmur or history of fainting. Patient denies any chest pain or pain radiating to her upper extremities. Patient denies any shortness of breath, difficulty breathing at night, cough or hemoptysis. Patient denies any swelling in the lower legs. Patient denies any nausea vomiting, vomiting of blood, or coffee ground material in the vomitus. Patient denies any stomach pain. Patient states has had normal bowel movements no significant constipation or diarrhea. Patient denies any dysuria, hematuria or significant nocturia. Patient denies any problems walking, swelling in the joints or loss of balance. Patient denies any skin changes, loss of hair or loss of weight. Patient denies any excessive worrying or anxiety or significant depression. Patient denies any problems with insomnia. Patient denies excessive thirst, polyuria, polydipsia. Patient denies any swollen glands, patient denies easy bruising or easy bleeding. Patient denies any recent infections, allergies or URI. Patient "s visual fields have not changed significantly in recent time.    PHYSICAL EXAM: BP (!) 143/67 (BP Location: Left Arm, Patient Position: Sitting)   Pulse (!) 52   Temp (!) 97.5 F (36.4 C) (Tympanic)   Resp 16   Wt 153 lb 5.3 oz (69.6 kg)   BMI 20.80 kg/m  Range of motion of his lower extremities does not elicit pain motor sensory and DTR levels are equal and symmetric in the upper lower extremities.Well-developed well-nourished patient in NAD. HEENT reveals PERLA, EOMI, discs not visualized.  Oral cavity is clear. No oral mucosal lesions are identified. Neck is clear without evidence of cervical or supraclavicular adenopathy. Lungs are clear to A&P. Cardiac  examination is essentially unremarkable with regular rate and rhythm without murmur rub or thrill. Abdomen is benign with no organomegaly or masses noted. Motor sensory and DTR levels are equal and symmetric in the upper and lower extremities. Cranial nerves II through XII are grossly intact. Proprioception is intact. No peripheral adenopathy or edema is identified. No motor or sensory levels are noted. Crude visual fields are within normal range.  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 Would plan on delivering 3000 cGy in 10 fractions from T12-L5 inclusive. Risks and benefits of treatment including possible diarrhea fatigue alteration of blood counts and skin reaction all were discussed in detail with the patient. I believe patient also may be a candidate for Xofigo. Will discuss the case with Dr. Ezzie Dural. Patient seems to compress my treatment plan well. I personally set up and scheduled CT simulation for next week.  I would like to take this opportunity to thank you for allowing me to participate in the care of your patient.Noreene Filbert,  MD

## 2018-03-01 ENCOUNTER — Telehealth: Payer: Self-pay | Admitting: Internal Medicine

## 2018-03-01 NOTE — Telephone Encounter (Signed)
FYI- Spoke to pt re: treatment options including chemo-docetaxel; left message for Dr.Yarbrough. Pt has appt with me on 8/19. Will discuss further.  GB

## 2018-03-04 ENCOUNTER — Other Ambulatory Visit: Payer: Self-pay

## 2018-03-04 ENCOUNTER — Inpatient Hospital Stay (HOSPITAL_BASED_OUTPATIENT_CLINIC_OR_DEPARTMENT_OTHER): Payer: Medicare Other | Admitting: Internal Medicine

## 2018-03-04 VITALS — BP 148/81 | HR 60 | Temp 97.9°F | Resp 18 | Ht 72.0 in | Wt 153.4 lb

## 2018-03-04 DIAGNOSIS — M545 Low back pain: Secondary | ICD-10-CM

## 2018-03-04 DIAGNOSIS — G893 Neoplasm related pain (acute) (chronic): Secondary | ICD-10-CM | POA: Diagnosis not present

## 2018-03-04 DIAGNOSIS — Z79818 Long term (current) use of other agents affecting estrogen receptors and estrogen levels: Secondary | ICD-10-CM | POA: Diagnosis not present

## 2018-03-04 DIAGNOSIS — Z79899 Other long term (current) drug therapy: Secondary | ICD-10-CM | POA: Diagnosis not present

## 2018-03-04 DIAGNOSIS — C7951 Secondary malignant neoplasm of bone: Secondary | ICD-10-CM

## 2018-03-04 DIAGNOSIS — R918 Other nonspecific abnormal finding of lung field: Secondary | ICD-10-CM

## 2018-03-04 DIAGNOSIS — Z806 Family history of leukemia: Secondary | ICD-10-CM

## 2018-03-04 DIAGNOSIS — C61 Malignant neoplasm of prostate: Secondary | ICD-10-CM

## 2018-03-04 MED ORDER — DEXAMETHASONE 4 MG PO TABS: 4.0000 mg | ORAL_TABLET | Freq: Two times a day (BID) | ORAL | 0 refills | Status: DC

## 2018-03-04 NOTE — Assessment & Plan Note (Addendum)
#   Castrate resistant prostate cancer-stage IV; on Trelstar; Xtandi.  July 24th 2019 CT scan chest and pelvis/bone scan-stable disease; improvement of the lung nodule right lower lobe [see discussion below]; MRI July 2019 -L2 epidural extension ; L3 nerve root compression suggestive of progressive disease.  PSA- slightly rising ~15.  Worsening.   #I had a long discussion the patient regarding-again overall incurable to the disease-median life expectancy of castrate resistant prostate cancer being 1 to 2 years.  Patient is obviously beyond his median life expectancy-with multiple chemotherapy options available-including docetaxel carboplatin/ cabazi. ? PARP inhibitor-tumor BRC mutated.  Also discussed regarding xofigo-bone directed therapy.   # Pain second malignancy-worse; recommend continue fentanyl patch 25 mcg; continue dexamethasone 4 mg 3 times daily.  Recommend taking Neurontin as prescribed 100 mg 3 times a day.  Recommend taking either tramadol/hydrocodone as needed for breakthrough pain.   # 2 week/labs- PSA/CBC/MPS/MD/Xgeva; Trelstar this week.   Addendum: I have reached out to [facility] neurosurgery Dr. Cari Caraway; who kindly agrees to check with [facility] IR regarding option of vertebroplasty.

## 2018-03-05 NOTE — Progress Notes (Signed)
[place] OFFICE PROGRESS NOTE  Patient Care Team: System, Provider Not In as PCP - General Royston Cowper, MD (Urology)  Cancer Staging Cancer of prostate [facility]) Staging form: Prostate, AJCC 7th Edition - Clinical: Stage IV (T2, M1) - Signed by Evlyn Kanner, NP on 01/07/2015    Oncology History   # FEB 2014- Prostate cancer Stage II [T2N0]; PA-18; [Dr.Wolfe]  # May 2015-STAGE IV;  PSA 90 on ADT; CT/Bone scan-extensive mets;  casodex+ Trelstar  # June 2016- Castration resistant prostate cancer/[facility] protocol- ZYTIGA plus minus XTANDI; June 28th-CT-C/A/P- Stable Retrocrural/RP/Pelvic LN;Bone scan- Stable sclerotic lesions.   # MARCH 2019- Taken off trial [ based on interm analysis]; continue X-tandi [given not needing steroids]  # July 2019- CT/bone scan stable; MRI lumbar spine- epidural extension/L3 spinal nerves involvement.    # RLL [~63m lung nodule] s/p Bx- Cryptococcus- ID/Dr.Fitzgerald- on diflucan   # Xgeva  # MOLECULAR TESTING/omniseq- BRCA-2 MUTATED; PDL-1- NEG; MSS; N-TRK-NEG  # GENETICS- BRCA-NEG; hetrozygous MUTHY; recommended family work up --------------------------------------------------    DIAGNOSIS: _0  Metastatic prostate cancer  STAGE: 4    ;GOALS: Palliative  CURRENT/MOST RECENT THERAPY-X-tandi+ Trelstar      [place] of [place] ([place]   08/17/2012 Initial Diagnosis    Cancer of prostate, stage II    12/03/2013 Progression       10/13/2014 Progression        Prostate cancer metastatic to multiple sites ([facility]      INTERVAL HISTORY:  PDEANGLO HISSONG89y.o.  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  Patient has met with [facility] neurosurgery-recommend vertebroplasty; radiation.  However, interventional radiology [facility]-felt not a candidate for vertebroplasty.   Patient has met with radiation oncology; plan to start radiation next week.  Patient continues to complain of fairly significant pain in his lower back radiating to his bilateral extremities.  He continues to be on dexamethasone 4 mg 3 times a day; and also fentanyl patch 25 mcg.  He has been sparingly been taking tramadol/hydrocodone.  He is not taking Neurontin.  Review of Systems  Constitutional: Negative for chills, diaphoresis, fever, malaise/fatigue and weight loss.  HENT: Negative for nosebleeds and sore throat.   Eyes: Negative for double vision.  Respiratory: Negative for cough, hemoptysis, sputum production, shortness of breath and wheezing.   Cardiovascular: Negative for chest pain, palpitations, orthopnea and leg swelling.  Gastrointestinal: Negative for abdominal pain, blood in stool, constipation, diarrhea, heartburn, melena, nausea and vomiting.  Genitourinary: Negative for dysuria, frequency and urgency.  Musculoskeletal: Positive for back pain and joint pain.  Skin: Negative.  Negative for itching and rash.  Neurological: Negative for dizziness, focal weakness, weakness and headaches.  Endo/Heme/Allergies: Does not bruise/bleed easily.  Psychiatric/Behavioral: Negative for depression. The patient is not nervous/anxious and does not have insomnia.       PAST MEDICAL HISTORY :  Past Medical History:  Diagnosis Date  . [place] of [place] ([place] 07[place]2012  . Family history of leukemia   . Prostate cancer ([place]     PAST SURGICAL HISTORY :   Past Surgical History:  Procedure Laterality Date  . EXTRACORPOREAL SHOCK WAVE LITHOTRIPSY Left 05/20/2015   Procedure: EXTRACORPOREAL SHOCK WAVE LITHOTRIPSY (ESWL);  Surgeon: MRoyston Cowper MD;  Location: [facility] ORS;  Service: Urology;  Laterality: Left;  . EXTRACORPOREAL SHOCK WAVE LITHOTRIPSY Right 12/21/2016  Procedure: EXTRACORPOREAL SHOCK WAVE LITHOTRIPSY (ESWL);  Surgeon: Royston Cowper, MD;  Location: [facility] ORS;  Service: Urology;   Laterality: Right;  . IR RADIOLOGIST EVAL & MGMT  02/28/2018    FAMILY HISTORY :   Family History  Problem Relation Age of Onset  . Alzheimer's disease Father   . Lung cancer Maternal Grandfather 71  . Leukemia Paternal Grandmother 56  . COPD Paternal Grandfather   . Cancer Maternal Uncle        type unk, dx 80's    SOCIAL HISTORY:   Social History   Tobacco Use  . Smoking status: Never Smoker  . Smokeless tobacco: Never Used  Substance Use Topics  . Alcohol use: No  . Drug use: No    ALLERGIES:  is allergic to no known allergies.  MEDICATIONS:  Current Outpatient Medications  Medication Sig Dispense Refill  . dexamethasone (DECADRON) 4 MG tablet Take 1 tablet (4 mg total) by mouth 2 (two) times daily. 30 tablet 0  . enzalutamide (XTANDI) 40 MG capsule Take 4 capsules by mouth daily.    . fentaNYL (DURAGESIC - DOSED MCG/HR) 25 MCG/HR patch Place 1 patch (25 mcg total) onto the skin every 3 (three) days. 5 patch 0  . oxybutynin (DITROPAN-XL) 5 MG 24 hr tablet Take 1 tablet (5 mg total) by mouth at bedtime. 90 tablet 3  . tamsulosin (FLOMAX) 0.4 MG CAPS capsule Take 1 capsule (0.4 mg total) by mouth daily. 30 capsule 11  . traMADol (ULTRAM) 50 MG tablet Take 1 tablet (50 mg total) by mouth every 8 (eight) hours as needed. 45 tablet 1  . Triptorelin Pamoate (TRELSTAR) 22.5 MG injection Inject 22.5 mg into the muscle every 6 (six) months.    . valACYclovir (VALTREX) 1000 MG tablet Take 1 tablet (1,000 mg total) by mouth 2 (two) times daily. (Patient taking differently: Take 1,000 mg by mouth 2 (two) times daily as needed. ) 60 tablet 6  . gabapentin (NEURONTIN) 100 MG capsule Take 100 mg by mouth 3 (three) times daily.    Marland Kitchen HYDROcodone-acetaminophen (NORCO/VICODIN) 5-325 MG tablet Take 1 tablet by mouth every 6 (six) hours as needed for moderate pain.     No current facility-administered medications for this visit.     PHYSICAL EXAMINATION: ECOG PERFORMANCE STATUS: 1 -  Symptomatic but completely ambulatory  BP (!) 148/81   Pulse 60   Temp 97.9 F (36.6 C) (Tympanic)   Resp 18   Ht 6' (1.829 m)   Wt 153 lb 6.4 oz (69.6 kg)   BMI 20.80 kg/m   Filed Weights   03/04/18 1112  Weight: 153 lb 6.4 oz (69.6 kg)    GENERAL: Well-nourished well-developed; Alert, no distress and comfortable.  Alone. EYES: no pallor or icterus OROPHARYNX: no thrush or ulceration; NECK: supple; no lymph nodes felt. LYMPH:  no palpable lymphadenopathy in the axillary or inguinal regions LUNGS: Decreased breath sounds auscultation bilaterally. No wheeze or crackles HEART/CVS: regular rate & rhythm and no murmurs; No lower extremity edema ABDOMEN:abdomen soft, non-tender and normal bowel sounds. No hepatomegaly or splenomegaly.  Musculoskeletal:no cyanosis of digits and no clubbing  PSYCH: alert & oriented x 3 with fluent speech NEURO: no focal motor/sensory deficits SKIN:  no rashes or significant lesions    LABORATORY DATA:  I have reviewed the data as listed    Component Value Date/Time   NA 138 02/08/2018 1008   NA 137 11/12/2014 1415   K 4.5 02/08/2018 1008  K 4.1 11/12/2014 1415   CL 106 02/08/2018 1008   CL 105 11/12/2014 1415   CO2 24 02/08/2018 1008   CO2 24 11/12/2014 1415   GLUCOSE 99 02/08/2018 1008   GLUCOSE 98 11/12/2014 1415   BUN 21 02/08/2018 1008   BUN 24 (H) 11/12/2014 1415   CREATININE 0.97 02/08/2018 1008   CREATININE 1.02 11/12/2014 1415   CALCIUM 9.3 02/08/2018 1008   CALCIUM 9.2 11/12/2014 1415   PROT 6.9 02/08/2018 1008   PROT 7.4 11/12/2014 1415   ALBUMIN 4.3 02/08/2018 1008   ALBUMIN 4.4 11/12/2014 1415   AST 14 (L) 02/08/2018 1008   AST 23 11/12/2014 1415   ALT 11 02/08/2018 1008   ALT 16 (L) 11/12/2014 1415   ALKPHOS 83 02/08/2018 1008   ALKPHOS 103 11/12/2014 1415   BILITOT 0.7 02/08/2018 1008   BILITOT 0.4 11/12/2014 1415   GFRNONAA >60 02/08/2018 1008   GFRNONAA >60 11/12/2014 1415   GFRAA >60 02/08/2018 1008    GFRAA >60 11/12/2014 1415    No results found for: SPEP, UPEP  Lab Results  Component Value Date   WBC 4.0 02/08/2018   NEUTROABS 2.0 02/08/2018   HGB 14.1 02/08/2018   HCT 41.4 02/08/2018   MCV 96.9 02/08/2018   PLT 223 02/08/2018      Chemistry      Component Value Date/Time   NA 138 02/08/2018 1008   NA 137 11/12/2014 1415   K 4.5 02/08/2018 1008   K 4.1 11/12/2014 1415   CL 106 02/08/2018 1008   CL 105 11/12/2014 1415   CO2 24 02/08/2018 1008   CO2 24 11/12/2014 1415   BUN 21 02/08/2018 1008   BUN 24 (H) 11/12/2014 1415   CREATININE 0.97 02/08/2018 1008   CREATININE 1.02 11/12/2014 1415      Component Value Date/Time   CALCIUM 9.3 02/08/2018 1008   CALCIUM 9.2 11/12/2014 1415   ALKPHOS 83 02/08/2018 1008   ALKPHOS 103 11/12/2014 1415   AST 14 (L) 02/08/2018 1008   AST 23 11/12/2014 1415   ALT 11 02/08/2018 1008   ALT 16 (L) 11/12/2014 1415   BILITOT 0.7 02/08/2018 1008   BILITOT 0.4 11/12/2014 1415       RADIOGRAPHIC STUDIES: I have personally reviewed the radiological images as listed and agreed with the findings in the report. No results found.   ASSESSMENT & PLAN:  Prostate cancer metastatic to multiple sites [facility]) # Castrate resistant prostate cancer-stage IV; on Trelstar; Xtandi.  July 24th 2019 CT scan chest and pelvis/bone scan-stable disease; improvement of the lung nodule right lower lobe [see discussion below]; MRI July 2019 -L2 epidural extension ; L3 nerve root compression suggestive of progressive disease.  PSA- slightly rising ~15.  Worsening.   #I had a long discussion the patient regarding-again overall incurable to the disease-median life expectancy of castrate resistant prostate cancer being 1 to 2 years.  Patient is obviously beyond his median life expectancy-with multiple chemotherapy options available-including docetaxel carboplatin/ cabazi. ? PARP inhibitor-tumor BRC mutated.  Also discussed regarding xofigo-bone directed therapy.   #  Pain second malignancy-worse; recommend continue fentanyl patch 25 mcg; continue dexamethasone 4 mg 3 times daily.  Recommend taking Neurontin as prescribed 100 mg 3 times a day.  Recommend taking either tramadol/hydrocodone as needed for breakthrough pain.   # 2 week/labs- PSA/CBC/MPS/MD/Xgeva; Trelstar this week.   Addendum: I have reached out to [facility] neurosurgery Dr. Cari Caraway; who kindly agrees to check with [facility] IR regarding  option of vertebroplasty.     Orders Placed This Encounter  Procedures  . CBC with Differential/Platelet    Standing Status:   Future    Standing Expiration Date:   03/05/2019  . Comprehensive metabolic panel    Standing Status:   Future    Standing Expiration Date:   03/05/2019  . PSA    Standing Status:   Future    Standing Expiration Date:   03/05/2019   All questions were answered. The patient knows to call the clinic with any problems, questions or concerns.      Cammie Sickle, MD 03/05/2018 7:13 AM

## 2018-03-06 ENCOUNTER — Ambulatory Visit
Admission: RE | Admit: 2018-03-06 | Discharge: 2018-03-06 | Disposition: A | Discharge status: Home / Self Care | Payer: Medicare Other | Source: Ambulatory Visit | Attending: Radiation Oncology | Admitting: Radiation Oncology

## 2018-03-06 DIAGNOSIS — Z510 Encounter for antineoplastic radiation therapy: Secondary | ICD-10-CM | POA: Diagnosis not present

## 2018-03-06 DIAGNOSIS — C7951 Secondary malignant neoplasm of bone: Secondary | ICD-10-CM | POA: Insufficient documentation

## 2018-03-06 DIAGNOSIS — C61 Malignant neoplasm of prostate: Secondary | ICD-10-CM | POA: Diagnosis not present

## 2018-03-08 ENCOUNTER — Inpatient Hospital Stay (HOSPITAL_BASED_OUTPATIENT_CLINIC_OR_DEPARTMENT_OTHER): Payer: Medicare Other | Admitting: Internal Medicine

## 2018-03-08 ENCOUNTER — Telehealth: Payer: Self-pay | Admitting: *Deleted

## 2018-03-08 ENCOUNTER — Encounter: Payer: Self-pay | Admitting: Internal Medicine

## 2018-03-08 ENCOUNTER — Ambulatory Visit: Payer: Medicare Other | Admitting: Internal Medicine

## 2018-03-08 VITALS — BP 128/77 | HR 60 | Temp 97.6°F | Resp 20 | Wt 152.0 lb

## 2018-03-08 DIAGNOSIS — Z806 Family history of leukemia: Secondary | ICD-10-CM | POA: Diagnosis not present

## 2018-03-08 DIAGNOSIS — C61 Malignant neoplasm of prostate: Secondary | ICD-10-CM | POA: Diagnosis not present

## 2018-03-08 DIAGNOSIS — Z79899 Other long term (current) drug therapy: Secondary | ICD-10-CM | POA: Diagnosis not present

## 2018-03-08 DIAGNOSIS — Z79818 Long term (current) use of other agents affecting estrogen receptors and estrogen levels: Secondary | ICD-10-CM | POA: Diagnosis not present

## 2018-03-08 DIAGNOSIS — C7651 Malignant neoplasm of right lower limb: Secondary | ICD-10-CM | POA: Diagnosis not present

## 2018-03-08 DIAGNOSIS — G893 Neoplasm related pain (acute) (chronic): Secondary | ICD-10-CM | POA: Diagnosis not present

## 2018-03-08 DIAGNOSIS — M545 Low back pain: Secondary | ICD-10-CM

## 2018-03-08 DIAGNOSIS — R918 Other nonspecific abnormal finding of lung field: Secondary | ICD-10-CM

## 2018-03-08 DIAGNOSIS — C7951 Secondary malignant neoplasm of bone: Secondary | ICD-10-CM | POA: Diagnosis not present

## 2018-03-08 NOTE — Progress Notes (Signed)
[place] OFFICE PROGRESS NOTE  Patient Care Team: System, Provider Not In as PCP - General Royston Cowper, MD (Urology)  Cancer Staging Cancer of prostate [facility]) Staging form: Prostate, AJCC 7th Edition - Clinical: Stage IV (T2, M1) - Signed by Evlyn Kanner, NP on 01/07/2015    Oncology History   # FEB 2014- Prostate cancer Stage II [T2N0]; PA-18; [Dr.Wolfe]  # May 2015-STAGE IV;  PSA 90 on ADT; CT/Bone scan-extensive mets;  casodex+ Trelstar  # June 2016- Castration resistant prostate cancer/[facility] protocol- ZYTIGA plus minus XTANDI; June 28th-CT-C/A/P- Stable Retrocrural/RP/Pelvic LN;Bone scan- Stable sclerotic lesions.   # MARCH 2019- Taken off trial [ based on interm analysis]; continue X-tandi [given not needing steroids]  # July 2019- CT/bone scan stable; MRI lumbar spine- epidural extension/L3 spinal nerves involvement.    # RLL [~49m lung nodule] s/p Bx- Cryptococcus- ID/Dr.Fitzgerald- on diflucan   # Xgeva  # MOLECULAR TESTING/omniseq- BRCA-2 MUTATED; PDL-1- NEG; MSS; N-TRK-NEG  # GENETICS- BRCA-NEG; hetrozygous MUTHY; recommended family work up --------------------------------------------------    DIAGNOSIS: _0  Metastatic prostate cancer  STAGE: 4    ;GOALS: Palliative  CURRENT/MOST RECENT THERAPY-X-tandi+ Trelstar      Cancer of prostate ([place]   08/17/2012 Initial Diagnosis    Cancer of prostate, stage II    12/03/2013 Progression       10/13/2014 Progression        Prostate cancer metastatic to multiple sites ([facility]      INTERVAL HISTORY:  William [id number].  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  In the interim patient has met with radiation oncology; status post simulation-awaiting to start radiation next week.  Patient  extremely concerned about the potential side effects of radiation.  [facility] to start after finishing radiation  He continues to have pain radiating to his legs-currently on fentanyl patch/oxycodone and Neurontin.  Review of Systems  Constitutional: Negative for chills, diaphoresis, fever, malaise/fatigue and weight loss.  HENT: Negative for nosebleeds and sore throat.   Eyes: Negative for double vision.  Respiratory: Negative for cough, hemoptysis, sputum production, shortness of breath and wheezing.   Cardiovascular: Negative for chest pain, palpitations, orthopnea and leg swelling.  Gastrointestinal: Negative for abdominal pain, blood in stool, constipation, diarrhea, heartburn, melena, nausea and vomiting.  Genitourinary: Negative for dysuria, frequency and urgency.  Musculoskeletal: Positive for back pain and joint pain.  Skin: Negative.  Negative for itching and rash.  Neurological: Negative for dizziness, focal weakness, weakness and headaches.  Endo/Heme/Allergies: Does not bruise/bleed easily.  Psychiatric/Behavioral: Negative for depression. The patient is not nervous/anxious and does not have insomnia.       PAST MEDICAL HISTORY :  Past Medical History:  Diagnosis Date  . [place]  . Family history of leukemia   . [place]     PAST SURGICAL HISTORY :   Past Surgical History:  Procedure Laterality Date  . EXTRACORPOREAL SHOCK WAVE LITHOTRIPSY Left 05/20/2015   Procedure: EXTRACORPOREAL SHOCK WAVE LITHOTRIPSY (ESWL);  Surgeon: MRoyston Cowper MD;  Location: [facility] ORS;  Service: Urology;  Laterality: Left;  . EXTRACORPOREAL SHOCK WAVE LITHOTRIPSY Right 12/21/2016   Procedure: EXTRACORPOREAL SHOCK WAVE LITHOTRIPSY (ESWL);  Surgeon: WRoyston Cowper MD;  Location: [facility] ORS;  Service: Urology;  Laterality: Right;  . IR RADIOLOGIST EVAL & MGMT  02/28/2018     FAMILY HISTORY :   Family History  Problem Relation Age of Onset  . Alzheimer's disease Father   . Lung cancer Maternal Grandfather 7  . Leukemia Paternal Grandmother 49  . COPD Paternal Grandfather   . Cancer Maternal Uncle        type unk, dx 80's    SOCIAL HISTORY:   Social History   Tobacco Use  . Smoking status: Never Smoker  . Smokeless tobacco: Never Used  Substance Use Topics  . Alcohol use: No  . Drug use: No    ALLERGIES:  is allergic to no known allergies.  MEDICATIONS:  Current Outpatient Medications  Medication Sig Dispense Refill  . dexamethasone (DECADRON) 4 MG tablet Take 1 tablet (4 mg total) by mouth 2 (two) times daily. 30 tablet 0  . enzalutamide (XTANDI) 40 MG capsule Take 4 capsules by mouth daily.    . fentaNYL (DURAGESIC - DOSED MCG/HR) 25 MCG/HR patch Place 1 patch (25 mcg total) onto the skin every 3 (three) days. 5 patch 0  . gabapentin (NEURONTIN) 100 MG capsule Take 100 mg by mouth 3 (three) times daily.    Marland Kitchen oxybutynin (DITROPAN-XL) 5 MG 24 hr tablet Take 1 tablet (5 mg total) by mouth at bedtime. 90 tablet 3  . tamsulosin (FLOMAX) 0.4 MG CAPS capsule Take 1 capsule (0.4 mg total) by mouth daily. 30 capsule 11  . traMADol (ULTRAM) 50 MG tablet Take 1 tablet (50 mg total) by mouth every 8 (eight) hours as needed. 45 tablet 1  . valACYclovir (VALTREX) 1000 MG tablet Take 1 tablet (1,000 mg total) by mouth 2 (two) times daily. (Patient taking differently: Take 1,000 mg by mouth 2 (two) times daily as needed. ) 60 tablet 6  . HYDROcodone-acetaminophen (NORCO/VICODIN) 5-325 MG tablet Take 1 tablet by mouth every 6 (six) hours as needed for moderate pain.    . Triptorelin Pamoate (TRELSTAR) 22.5 MG injection Inject 22.5 mg into the muscle every 6 (six) months.     No current facility-administered medications for this visit.     PHYSICAL EXAMINATION: ECOG PERFORMANCE STATUS: 1 - Symptomatic but completely ambulatory  BP 128/77   Pulse 60    Temp 97.6 F (36.4 C) (Tympanic)   Resp 20   Wt 152 lb (68.9 kg)   BMI 20.61 kg/m   Filed Weights   03/08/18 1038  Weight: 152 lb (68.9 kg)    Physical Exam  Constitutional: He is oriented to person, place, and time and well-developed, well-nourished, and in no distress.  Alone.  Walking by himself.  HENT:  Head: Normocephalic and atraumatic.  Mouth/Throat: Oropharynx is clear and moist. No oropharyngeal exudate.  Eyes: Pupils are equal, round, and reactive to light.  Neck: Normal range of motion. Neck supple.  Cardiovascular: Normal rate and regular rhythm.  Pulmonary/Chest: No respiratory distress. He has no wheezes.  Abdominal: Soft. Bowel sounds are normal. He exhibits no distension and no mass. There is no tenderness. There is no rebound and no guarding.  Musculoskeletal: Normal range of motion. He exhibits no edema or tenderness.  Neurological: He is alert and oriented to person, place, and time.  Skin: Skin is warm.  Psychiatric: Affect normal.       LABORATORY DATA:  I have reviewed the data as listed    Component Value Date/Time   NA 138 02/08/2018 1008   NA 137 11/12/2014 1415  K 4.5 02/08/2018 1008   K 4.1 11/12/2014 1415   CL 106 02/08/2018 1008   CL 105 11/12/2014 1415   CO2 24 02/08/2018 1008   CO2 24 11/12/2014 1415   GLUCOSE 99 02/08/2018 1008   GLUCOSE 98 11/12/2014 1415   BUN 21 02/08/2018 1008   BUN 24 (H) 11/12/2014 1415   CREATININE 0.97 02/08/2018 1008   CREATININE 1.02 11/12/2014 1415   CALCIUM 9.3 02/08/2018 1008   CALCIUM 9.2 11/12/2014 1415   PROT 6.9 02/08/2018 1008   PROT 7.4 11/12/2014 1415   ALBUMIN 4.3 02/08/2018 1008   ALBUMIN 4.4 11/12/2014 1415   AST 14 (L) 02/08/2018 1008   AST 23 11/12/2014 1415   ALT 11 02/08/2018 1008   ALT 16 (L) 11/12/2014 1415   ALKPHOS 83 02/08/2018 1008   ALKPHOS 103 11/12/2014 1415   BILITOT 0.7 02/08/2018 1008   BILITOT 0.4 11/12/2014 1415   GFRNONAA >60 02/08/2018 1008   GFRNONAA >60  11/12/2014 1415   GFRAA >60 02/08/2018 1008   GFRAA >60 11/12/2014 1415    No results found for: SPEP, UPEP  Lab Results  Component Value Date   WBC 4.0 02/08/2018   NEUTROABS 2.0 02/08/2018   HGB 14.1 02/08/2018   HCT 41.4 02/08/2018   MCV 96.9 02/08/2018   PLT 223 02/08/2018      Chemistry      Component Value Date/Time   NA 138 02/08/2018 1008   NA 137 11/12/2014 1415   K 4.5 02/08/2018 1008   K 4.1 11/12/2014 1415   CL 106 02/08/2018 1008   CL 105 11/12/2014 1415   CO2 24 02/08/2018 1008   CO2 24 11/12/2014 1415   BUN 21 02/08/2018 1008   BUN 24 (H) 11/12/2014 1415   CREATININE 0.97 02/08/2018 1008   CREATININE 1.02 11/12/2014 1415      Component Value Date/Time   CALCIUM 9.3 02/08/2018 1008   CALCIUM 9.2 11/12/2014 1415   ALKPHOS 83 02/08/2018 1008   ALKPHOS 103 11/12/2014 1415   AST 14 (L) 02/08/2018 1008   AST 23 11/12/2014 1415   ALT 11 02/08/2018 1008   ALT 16 (L) 11/12/2014 1415   BILITOT 0.7 02/08/2018 1008   BILITOT 0.4 11/12/2014 1415       RADIOGRAPHIC STUDIES: I have personally reviewed the radiological images as listed and agreed with the findings in the report. No results found.   ASSESSMENT & PLAN:  Prostate cancer metastatic to multiple sites [facility]) # Castrate resistant prostate cancer-stage IV; on Trelstar; Xtandi.  July 24th 2019 CT scan chest and pelvis/bone scan-stable disease; however MRI July 2019 -L2 epidural extension ; L3 nerve root compression suggestive of progressive disease.  PSA- slightly rising ~15.  Worsening.   #Discussed patient needs multipronged approach to control his disease/control the pain.  Patient still undecided on systemic therapy.  #Encouraged the patient to proceed with radiation x awaiting 10 fractions discussed with Dr. Konrad Felix risk of potential side effects from radiation is small.   #Also encouraged the patient to keep up his appointment with [facility] pain service for vertebroplasty soon after finishing  radiation [patient finishing radiation on September 11]; recommend he calls [facility] to reschedule his appointment given his high school reunion on the 13th  # Pain second malignancy-stable recommend continue fentanyl patch 25 mcg/tramadol/hydrocodone continue dexamethasone 4 mg 3 times daily.   #  follow-up with me as planned.    No orders of the defined types were placed in this encounter.  All questions were answered. The patient knows to call the clinic with any problems, questions or concerns.      Cammie Sickle, MD 03/08/2018 4:50 PM

## 2018-03-08 NOTE — Telephone Encounter (Signed)
03/08/18: William Wright called at 8:20 this am wanting to set up an appointment with Dr. [name] to ask questions and get information about his upcoming treatments and RT.  I spoke with Renita Papa RN, in the clinic and was able to get an appointment for William Wright at 10:15 am today.  I called William Wright back and he will be at the clinic this morning.  Raynelle Dick, RN BSN "03/08/2018 8:58 AM"

## 2018-03-08 NOTE — Assessment & Plan Note (Addendum)
#   Castrate resistant prostate cancer-stage IV; on Trelstar; Xtandi.  July 24th 2019 CT scan chest and pelvis/bone scan-stable disease; however MRI July 2019 -L2 epidural extension ; L3 nerve root compression suggestive of progressive disease.  PSA- slightly rising ~15.  Worsening.   #Discussed patient needs multipronged approach to control his disease/control the pain.  Patient still undecided on systemic therapy.  #Encouraged the patient to proceed with radiation x awaiting 10 fractions discussed with Dr. Konrad Felix risk of potential side effects from radiation is small.   #Also encouraged the patient to keep up his appointment with [facility] pain service for vertebroplasty soon after finishing radiation [patient finishing radiation on September 11]; recommend he calls [facility] to reschedule his appointment given his high school reunion on the 13th  # Pain second malignancy-stable recommend continue fentanyl patch 25 mcg/tramadol/hydrocodone continue dexamethasone 4 mg 3 times daily.   #  follow-up with me as planned.

## 2018-03-08 NOTE — Progress Notes (Signed)
Patient requested to be added on to Dr. Aletha Halim schedule. He has several questions regarding the plan of care.

## 2018-03-11 ENCOUNTER — Inpatient Hospital Stay: Payer: Medicare Other

## 2018-03-11 ENCOUNTER — Other Ambulatory Visit: Payer: Self-pay

## 2018-03-11 DIAGNOSIS — G893 Neoplasm related pain (acute) (chronic): Secondary | ICD-10-CM | POA: Diagnosis not present

## 2018-03-11 DIAGNOSIS — Z79818 Long term (current) use of other agents affecting estrogen receptors and estrogen levels: Secondary | ICD-10-CM | POA: Diagnosis not present

## 2018-03-11 DIAGNOSIS — C7951 Secondary malignant neoplasm of bone: Secondary | ICD-10-CM | POA: Diagnosis not present

## 2018-03-11 DIAGNOSIS — M545 Low back pain: Secondary | ICD-10-CM | POA: Diagnosis not present

## 2018-03-11 DIAGNOSIS — C61 Malignant neoplasm of prostate: Secondary | ICD-10-CM | POA: Diagnosis not present

## 2018-03-11 DIAGNOSIS — R918 Other nonspecific abnormal finding of lung field: Secondary | ICD-10-CM | POA: Diagnosis not present

## 2018-03-11 LAB — CBC WITH DIFFERENTIAL/PLATELET
Basophils Absolute: 0 10*3/uL (ref 0–0.1)
Basophils Relative: 0 %
EOS ABS: 0 10*3/uL (ref 0–0.7)
EOS PCT: 0 %
HCT: 39.5 % — ABNORMAL LOW (ref 40.0–52.0)
Hemoglobin: 13.5 g/dL (ref 13.0–18.0)
LYMPHS ABS: 1.2 10*3/uL (ref 1.0–3.6)
LYMPHS PCT: 23 %
MCH: 33.8 pg (ref 26.0–34.0)
MCHC: 34.2 g/dL (ref 32.0–36.0)
MCV: 98.9 fL (ref 80.0–100.0)
MONO ABS: 0.5 10*3/uL (ref 0.2–1.0)
MONOS PCT: 10 %
Neutro Abs: 3.3 10*3/uL (ref 1.4–6.5)
Neutrophils Relative %: 67 %
PLATELETS: 205 10*3/uL (ref 150–440)
RBC: 3.99 MIL/uL — ABNORMAL LOW (ref 4.40–5.90)
RDW: 13.9 % (ref 11.5–14.5)
WBC: 5.1 10*3/uL (ref 3.8–10.6)

## 2018-03-11 LAB — COMPREHENSIVE METABOLIC PANEL
ALBUMIN: 3.8 g/dL (ref 3.5–5.0)
ALT: 12 U/L (ref 0–44)
AST: 13 U/L — AB (ref 15–41)
Alkaline Phosphatase: 64 U/L (ref 38–126)
Anion gap: 8 (ref 5–15)
BUN: 26 mg/dL — AB (ref 8–23)
CO2: 25 mmol/L (ref 22–32)
CREATININE: 0.9 mg/dL (ref 0.61–1.24)
Calcium: 8.8 mg/dL — ABNORMAL LOW (ref 8.9–10.3)
Chloride: 102 mmol/L (ref 98–111)
GFR calc Af Amer: >60 mL/min (ref >60)
GLUCOSE: 117 mg/dL — AB (ref 70–99)
Potassium: 4.5 mmol/L (ref 3.5–5.1)
SODIUM: 135 mmol/L (ref 135–145)
Total Bilirubin: 0.6 mg/dL (ref 0.3–1.2)
Total Protein: 6.1 g/dL — ABNORMAL LOW (ref 6.5–8.1)

## 2018-03-11 LAB — PSA: PROSTATIC SPECIFIC ANTIGEN: 22.73 ng/mL — AB (ref 0.00–4.00)

## 2018-03-12 ENCOUNTER — Ambulatory Visit
Admission: RE | Admit: 2018-03-12 | Discharge: 2018-03-12 | Disposition: A | Discharge status: Home / Self Care | Payer: Medicare Other | Source: Ambulatory Visit | Attending: Radiation Oncology | Admitting: Radiation Oncology

## 2018-03-12 ENCOUNTER — Inpatient Hospital Stay: Payer: Medicare Other

## 2018-03-12 DIAGNOSIS — Z79818 Long term (current) use of other agents affecting estrogen receptors and estrogen levels: Secondary | ICD-10-CM | POA: Diagnosis not present

## 2018-03-12 DIAGNOSIS — C7951 Secondary malignant neoplasm of bone: Secondary | ICD-10-CM | POA: Diagnosis not present

## 2018-03-12 DIAGNOSIS — C61 Malignant neoplasm of prostate: Secondary | ICD-10-CM | POA: Diagnosis not present

## 2018-03-12 DIAGNOSIS — Z510 Encounter for antineoplastic radiation therapy: Secondary | ICD-10-CM | POA: Diagnosis not present

## 2018-03-12 DIAGNOSIS — R918 Other nonspecific abnormal finding of lung field: Secondary | ICD-10-CM | POA: Diagnosis not present

## 2018-03-12 DIAGNOSIS — G893 Neoplasm related pain (acute) (chronic): Secondary | ICD-10-CM | POA: Diagnosis not present

## 2018-03-12 DIAGNOSIS — M545 Low back pain: Secondary | ICD-10-CM | POA: Diagnosis not present

## 2018-03-12 MED ORDER — TRIPTORELIN PAMOATE 22.5 MG IM SUSR
22.5000 mg | Freq: Once | INTRAMUSCULAR | Status: AC
  Administered 2018-03-12: 22.5 mg via INTRAMUSCULAR
  Filled 2018-03-12: qty 22.5

## 2018-03-13 ENCOUNTER — Ambulatory Visit
Admission: RE | Admit: 2018-03-13 | Discharge: 2018-03-13 | Disposition: A | Discharge status: Home / Self Care | Payer: Medicare Other | Source: Ambulatory Visit | Attending: Radiation Oncology | Admitting: Radiation Oncology

## 2018-03-13 DIAGNOSIS — Z510 Encounter for antineoplastic radiation therapy: Secondary | ICD-10-CM | POA: Diagnosis not present

## 2018-03-13 DIAGNOSIS — C61 Malignant neoplasm of prostate: Secondary | ICD-10-CM | POA: Diagnosis not present

## 2018-03-13 DIAGNOSIS — C7951 Secondary malignant neoplasm of bone: Secondary | ICD-10-CM | POA: Diagnosis not present

## 2018-03-14 ENCOUNTER — Ambulatory Visit
Admission: RE | Admit: 2018-03-14 | Discharge: 2018-03-14 | Disposition: A | Discharge status: Home / Self Care | Payer: Medicare Other | Source: Ambulatory Visit | Attending: Radiation Oncology | Admitting: Radiation Oncology

## 2018-03-14 ENCOUNTER — Telehealth: Payer: Self-pay | Admitting: *Deleted

## 2018-03-14 DIAGNOSIS — C61 Malignant neoplasm of prostate: Secondary | ICD-10-CM | POA: Diagnosis not present

## 2018-03-14 DIAGNOSIS — R011 Cardiac murmur, unspecified: Secondary | ICD-10-CM | POA: Diagnosis not present

## 2018-03-14 DIAGNOSIS — R310 Gross hematuria: Secondary | ICD-10-CM | POA: Diagnosis not present

## 2018-03-14 DIAGNOSIS — C7951 Secondary malignant neoplasm of bone: Secondary | ICD-10-CM | POA: Diagnosis not present

## 2018-03-14 DIAGNOSIS — Z01818 Encounter for other preprocedural examination: Secondary | ICD-10-CM | POA: Diagnosis not present

## 2018-03-14 DIAGNOSIS — Z510 Encounter for antineoplastic radiation therapy: Secondary | ICD-10-CM | POA: Diagnosis not present

## 2018-03-14 NOTE — Telephone Encounter (Signed)
[facility] NP called to report that patient is having hematuria and wants to know what we want to do about it. She is going to order a urnalysis on him today and states he is getting radiation therapy.Please advise

## 2018-03-14 NOTE — Telephone Encounter (Signed)
Per Dr B, patient should follow up with Dr Yves Dill his urologist regarding this. Summer informed

## 2018-03-15 ENCOUNTER — Ambulatory Visit
Admission: RE | Admit: 2018-03-15 | Discharge: 2018-03-15 | Disposition: A | Discharge status: Home / Self Care | Payer: Medicare Other | Source: Ambulatory Visit | Attending: Radiation Oncology | Admitting: Radiation Oncology

## 2018-03-15 ENCOUNTER — Other Ambulatory Visit: Payer: Self-pay | Admitting: *Deleted

## 2018-03-15 ENCOUNTER — Telehealth: Payer: Self-pay | Admitting: *Deleted

## 2018-03-15 ENCOUNTER — Other Ambulatory Visit: Payer: Self-pay | Admitting: Internal Medicine

## 2018-03-15 DIAGNOSIS — C61 Malignant neoplasm of prostate: Secondary | ICD-10-CM

## 2018-03-15 DIAGNOSIS — Z510 Encounter for antineoplastic radiation therapy: Secondary | ICD-10-CM | POA: Diagnosis not present

## 2018-03-15 DIAGNOSIS — C7951 Secondary malignant neoplasm of bone: Secondary | ICD-10-CM | POA: Diagnosis not present

## 2018-03-15 MED ORDER — DEXAMETHASONE 4 MG PO TABS: 4.0000 mg | ORAL_TABLET | Freq: Two times a day (BID) | ORAL | 0 refills | Status: DC

## 2018-03-15 MED ORDER — TRAMADOL HCL 50 MG PO TABS: 50.0000 mg | ORAL_TABLET | Freq: Three times a day (TID) | ORAL | 1 refills | Status: DC | PRN

## 2018-03-15 MED ORDER — FENTANYL 25 MCG/HR TD PT72: 25.0000 ug | MEDICATED_PATCH | TRANSDERMAL | 0 refills | Status: DC

## 2018-03-15 NOTE — Telephone Encounter (Signed)
Patient contacted research nurse to request RF for tramadol, fenantyl patch and decadron

## 2018-03-15 NOTE — Telephone Encounter (Signed)
Received a call from Mr. Cicalese today about needing prescription refills.  He was going to run out of Tramadol, Fentanyl patches and steroids.  I gave this information to Renita Papa, RN so that his meds could be called in to the [facility].  Patient was concerned since this is a holiday weekend that he might not be able to get the refills.  After returning to the office, Mr. Visconti called a second time stating that [facility] had called and said a urinalysis that he had tested this week showed positive for nitrite, blood and leucocytes indicating a possible bladder infection.  Mr. Deakin is concerned because he has a history of renal stones and has been treated for recent back pain issues.  He is scheduled for vertebroplasty in the next two weeks and wanted this issue addressed.  He does not know if a possible kidney stone, bladder infection and/or his metastatic prostate cancer is contributing to his pain.  I advised him to call the clinic triage nurse to see if he needed to be evaluated in the symptom management clinic or get an appointment with Dr. [name] today.  He was to call back if this issue was not resolved. Raynelle Dick, RN BSN "03/15/2018 12:00 PM"

## 2018-03-19 ENCOUNTER — Ambulatory Visit
Admission: RE | Admit: 2018-03-19 | Discharge: 2018-03-19 | Disposition: A | Discharge status: Home / Self Care | Payer: Medicare Other | Source: Ambulatory Visit | Attending: Radiation Oncology | Admitting: Radiation Oncology

## 2018-03-19 DIAGNOSIS — C61 Malignant neoplasm of prostate: Secondary | ICD-10-CM | POA: Insufficient documentation

## 2018-03-19 DIAGNOSIS — C7951 Secondary malignant neoplasm of bone: Secondary | ICD-10-CM | POA: Diagnosis not present

## 2018-03-19 DIAGNOSIS — Z510 Encounter for antineoplastic radiation therapy: Secondary | ICD-10-CM | POA: Diagnosis not present

## 2018-03-20 ENCOUNTER — Inpatient Hospital Stay (HOSPITAL_BASED_OUTPATIENT_CLINIC_OR_DEPARTMENT_OTHER): Payer: Medicare Other | Admitting: Internal Medicine

## 2018-03-20 ENCOUNTER — Ambulatory Visit
Admission: RE | Admit: 2018-03-20 | Discharge: 2018-03-20 | Disposition: A | Discharge status: Home / Self Care | Payer: Medicare Other | Source: Ambulatory Visit | Attending: Radiation Oncology | Admitting: Radiation Oncology

## 2018-03-20 ENCOUNTER — Inpatient Hospital Stay: Payer: Medicare Other | Attending: Internal Medicine

## 2018-03-20 ENCOUNTER — Other Ambulatory Visit: Payer: Self-pay

## 2018-03-20 ENCOUNTER — Inpatient Hospital Stay: Payer: Medicare Other

## 2018-03-20 VITALS — BP 124/70 | HR 67 | Temp 97.1°F | Resp 16 | Wt 154.6 lb

## 2018-03-20 DIAGNOSIS — Z79899 Other long term (current) drug therapy: Secondary | ICD-10-CM | POA: Insufficient documentation

## 2018-03-20 DIAGNOSIS — G893 Neoplasm related pain (acute) (chronic): Secondary | ICD-10-CM | POA: Insufficient documentation

## 2018-03-20 DIAGNOSIS — C61 Malignant neoplasm of prostate: Secondary | ICD-10-CM | POA: Insufficient documentation

## 2018-03-20 DIAGNOSIS — R918 Other nonspecific abnormal finding of lung field: Secondary | ICD-10-CM | POA: Insufficient documentation

## 2018-03-20 DIAGNOSIS — Z801 Family history of malignant neoplasm of trachea, bronchus and lung: Secondary | ICD-10-CM | POA: Diagnosis not present

## 2018-03-20 DIAGNOSIS — R110 Nausea: Secondary | ICD-10-CM

## 2018-03-20 DIAGNOSIS — C7951 Secondary malignant neoplasm of bone: Secondary | ICD-10-CM | POA: Insufficient documentation

## 2018-03-20 DIAGNOSIS — Z510 Encounter for antineoplastic radiation therapy: Secondary | ICD-10-CM | POA: Diagnosis not present

## 2018-03-20 DIAGNOSIS — Z803 Family history of malignant neoplasm of breast: Secondary | ICD-10-CM

## 2018-03-20 DIAGNOSIS — Z7189 Other specified counseling: Secondary | ICD-10-CM

## 2018-03-20 DIAGNOSIS — Z79818 Long term (current) use of other agents affecting estrogen receptors and estrogen levels: Secondary | ICD-10-CM

## 2018-03-20 DIAGNOSIS — F419 Anxiety disorder, unspecified: Secondary | ICD-10-CM | POA: Diagnosis not present

## 2018-03-20 DIAGNOSIS — Z806 Family history of leukemia: Secondary | ICD-10-CM | POA: Diagnosis not present

## 2018-03-20 LAB — COMPREHENSIVE METABOLIC PANEL
ALT: 11 U/L (ref 0–44)
ANION GAP: 8 (ref 5–15)
AST: 11 U/L — ABNORMAL LOW (ref 15–41)
Albumin: 3.9 g/dL (ref 3.5–5.0)
Alkaline Phosphatase: 62 U/L (ref 38–126)
BILIRUBIN TOTAL: 0.7 mg/dL (ref 0.3–1.2)
BUN: 22 mg/dL (ref 8–23)
CALCIUM: 9.3 mg/dL (ref 8.9–10.3)
CO2: 26 mmol/L (ref 22–32)
Chloride: 101 mmol/L (ref 98–111)
Creatinine, Ser: 0.85 mg/dL (ref 0.61–1.24)
Glucose, Bld: 114 mg/dL — ABNORMAL HIGH (ref 70–99)
Potassium: 4.4 mmol/L (ref 3.5–5.1)
SODIUM: 135 mmol/L (ref 135–145)
TOTAL PROTEIN: 6.3 g/dL — AB (ref 6.5–8.1)

## 2018-03-20 LAB — CBC WITH DIFFERENTIAL/PLATELET
BASOS PCT: 0 %
Basophils Absolute: 0 10*3/uL (ref 0–0.1)
Eosinophils Absolute: 0.1 10*3/uL (ref 0–0.7)
Eosinophils Relative: 1 %
HEMATOCRIT: 40 % (ref 40.0–52.0)
HEMOGLOBIN: 13.7 g/dL (ref 13.0–18.0)
Lymphocytes Relative: 11 %
Lymphs Abs: 0.5 10*3/uL — ABNORMAL LOW (ref 1.0–3.6)
MCH: 33.8 pg (ref 26.0–34.0)
MCHC: 34.2 g/dL (ref 32.0–36.0)
MCV: 98.8 fL (ref 80.0–100.0)
Monocytes Absolute: 0.3 10*3/uL (ref 0.2–1.0)
Monocytes Relative: 6 %
NEUTROS ABS: 4.1 10*3/uL (ref 1.4–6.5)
NEUTROS PCT: 82 %
Platelets: 219 10*3/uL (ref 150–440)
RBC: 4.05 MIL/uL — ABNORMAL LOW (ref 4.40–5.90)
RDW: 13.4 % (ref 11.5–14.5)
WBC: 5 10*3/uL (ref 3.8–10.6)

## 2018-03-20 LAB — PSA: Prostatic Specific Antigen: 18.39 ng/mL — ABNORMAL HIGH (ref 0.00–4.00)

## 2018-03-20 MED ORDER — DENOSUMAB 120 MG/1.7ML ~~LOC~~ SOLN
120.0000 mg | Freq: Once | SUBCUTANEOUS | Status: AC
  Administered 2018-03-20: 120 mg via SUBCUTANEOUS
  Filled 2018-03-20: qty 1.7

## 2018-03-20 MED ORDER — OLAPARIB 150 MG PO TABS: 300.0000 mg | ORAL_TABLET | Freq: Two times a day (BID) | ORAL | 3 refills | Status: DC

## 2018-03-20 NOTE — Progress Notes (Signed)
[place] OFFICE PROGRESS NOTE  Patient Care Team: System, Provider Not In as PCP - General Royston Cowper, MD (Urology)  Cancer Staging Cancer of prostate [facility]) Staging form: Prostate, AJCC 7th Edition - Clinical: Stage IV (T2, M1) - Signed by Evlyn Kanner, NP on 01/07/2015    Oncology History   # FEB 2014- Prostate cancer Stage II [T2N0]; PA-18; [Dr.Wolfe]  # May 2015-STAGE IV;  PSA 90 on ADT; CT/Bone scan-extensive mets;  casodex+ Trelstar  # June 2016- Castration resistant prostate cancer/[facility] protocol- ZYTIGA plus minus XTANDI; June 28th-CT-C/A/P- Stable Retrocrural/RP/Pelvic LN;Bone scan- Stable sclerotic lesions.   # MARCH 2019- Taken off trial [ based on interm analysis]; continue X-tandi [given not needing steroids]  # July 2019- CT/bone scan stable; MRI lumbar spine- epidural extension/L3 spinal nerves involvement; on RT [finish sep 12th]  # vertebroplasty [[facility]] on sep 13th 2019   # RLL [~18m lung nodule] s/p Bx- Cryptococcus- ID/Dr.Fitzgerald- on diflucan   # Xgeva  # MOLECULAR TESTING/omniseq- BRCA-2 MUTATED; PDL-1- NEG; MSS; N-TRK-NEG  # GENETICS- BRCA-NEG; hetrozygous MUTHY; recommended family work up --------------------------------------------------    DIAGNOSIS: '[ ]'  Metastatic prostate cancer  STAGE: 4    ;GOALS: Palliative  CURRENT/MOST RECENT THERAPY-X-tandi+ Trelstar      Cancer of prostate ([place]   08/17/2012 Initial Diagnosis    Cancer of prostate, stage II    12/03/2013 Progression       10/13/2014 Progression        Prostate cancer metastatic to multiple sites ([facility]      INTERVAL HISTORY:  William [id number].[id number].  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  In the interim patient has started radiation treatments.  He is currently status post 5  fractions; and has 5 more fractions to go.  Patient has not noted any significant improvement of the pain; continues to radiate his legs.  He denies any bladder or bowel incontinence.  He continues to be on fentanyl patch; dexamethasone 4 twice daily; Neurontin/tramadol; not taking hydrocodone.   Review of Systems  Constitutional: Negative for chills, diaphoresis, fever, malaise/fatigue and weight loss.  HENT: Negative for nosebleeds and sore throat.   Eyes: Negative for double vision.  Respiratory: Negative for cough, hemoptysis, sputum production, shortness of breath and wheezing.   Cardiovascular: Negative for chest pain, palpitations, orthopnea and leg swelling.  Gastrointestinal: Negative for abdominal pain, blood in stool, constipation, diarrhea, heartburn, melena, nausea and vomiting.  Genitourinary: Negative for dysuria, frequency and urgency.  Musculoskeletal: Positive for back pain and joint pain.  Skin: Negative.  Negative for itching and rash.  Neurological: Negative for dizziness, focal weakness, weakness and headaches.  Endo/Heme/Allergies: Does not bruise/bleed easily.  Psychiatric/Behavioral: Negative for depression. The patient is nervous/anxious. The patient does not have insomnia.       PAST MEDICAL HISTORY :  Past Medical History:  Diagnosis Date  . Cancer of prostate ([place] 07/2012  . Family history of leukemia   . Prostate cancer ([place]     PAST SURGICAL HISTORY :   Past Surgical History:  Procedure Laterality Date  . EXTRACORPOREAL SHOCK WAVE LITHOTRIPSY Left 05/20/2015   Procedure: EXTRACORPOREAL SHOCK WAVE LITHOTRIPSY (ESWL);  Surgeon: MRoyston Cowper MD;  Location: [facility] ORS;  Service: Urology;  Laterality: Left;  . EXTRACORPOREAL SHOCK WAVE LITHOTRIPSY Right 12/21/2016   Procedure: EXTRACORPOREAL SHOCK WAVE LITHOTRIPSY (ESWL);  Surgeon: Royston Cowper, MD;  Location: [facility] ORS;  Service: Urology;  Laterality: Right;  . IR RADIOLOGIST EVAL & MGMT  02/28/2018     FAMILY HISTORY :   Family History  Problem Relation Age of Onset  . Alzheimer's disease Father   . Lung cancer Maternal Grandfather 64  . Leukemia Paternal Grandmother 57  . COPD Paternal Grandfather   . Cancer Maternal Uncle        type unk, dx 80's    SOCIAL HISTORY:   Social History   Tobacco Use  . Smoking status: Never Smoker  . Smokeless tobacco: Never Used  Substance Use Topics  . Alcohol use: No  . Drug use: No    ALLERGIES:  is allergic to no known allergies.  MEDICATIONS:  Current Outpatient Medications  Medication Sig Dispense Refill  . dexamethasone (DECADRON) 4 MG tablet Take 1 tablet (4 mg total) by mouth 2 (two) times daily. 30 tablet 0  . enzalutamide (XTANDI) 40 MG capsule Take 4 capsules by mouth daily.    . fentaNYL (DURAGESIC - DOSED MCG/HR) 25 MCG/HR patch Place 1 patch (25 mcg total) onto the skin every 3 (three) days. 5 patch 0  . gabapentin (NEURONTIN) 100 MG capsule Take 100 mg by mouth 3 (three) times daily.    Marland Kitchen HYDROcodone-acetaminophen (NORCO/VICODIN) 5-325 MG tablet Take 1 tablet by mouth every 6 (six) hours as needed for moderate pain.    Marland Kitchen oxybutynin (DITROPAN-XL) 5 MG 24 hr tablet Take 1 tablet (5 mg total) by mouth at bedtime. 90 tablet 3  . tamsulosin (FLOMAX) 0.4 MG CAPS capsule Take 1 capsule (0.4 mg total) by mouth daily. 30 capsule 11  . traMADol (ULTRAM) 50 MG tablet Take 1 tablet (50 mg total) by mouth every 8 (eight) hours as needed. 45 tablet 1  . Triptorelin Pamoate (TRELSTAR) 22.5 MG injection Inject 22.5 mg into the muscle every 6 (six) months.    . valACYclovir (VALTREX) 1000 MG tablet Take 1 tablet (1,000 mg total) by mouth 2 (two) times daily. (Patient taking differently: Take 1,000 mg by mouth 2 (two) times daily as needed. ) 60 tablet 6  . olaparib (LYNPARZA) 150 MG tablet Take 2 tablets (300 mg total) by mouth 2 (two) times daily. Swallow whole. 120 tablet 3   No current facility-administered medications for this visit.      PHYSICAL EXAMINATION: ECOG PERFORMANCE STATUS: 1 - Symptomatic but completely ambulatory  BP 124/70 (BP Location: Left Arm, Patient Position: Sitting)   Pulse 67   Temp (!) 97.1 F (36.2 C)   Resp 16   Wt 154 lb 9.6 oz (70.1 kg)   BMI 20.97 kg/m   Filed Weights   03/20/18 1102  Weight: 154 lb 9.6 oz (70.1 kg)    Physical Exam  Constitutional: He is oriented to person, place, and time and well-developed, well-nourished, and in no distress.  Alone.  Walking by himself.  HENT:  Head: Normocephalic and atraumatic.  Mouth/Throat: Oropharynx is clear and moist. No oropharyngeal exudate.  Eyes: Pupils are equal, round, and reactive to light.  Neck: Normal range of motion. Neck supple.  Cardiovascular: Normal rate and regular rhythm.  Pulmonary/Chest: No respiratory distress. He has no wheezes.  Abdominal: Soft. Bowel sounds are normal. He exhibits no distension and no mass. There is no tenderness. There is no rebound and no guarding.  Musculoskeletal: Normal range of motion. He exhibits no edema or tenderness.  Neurological: He is alert and oriented to person,  place, and time.  Skin: Skin is warm.  Psychiatric: Affect normal.       LABORATORY DATA:  I have reviewed the data as listed    Component Value Date/Time   NA 135 03/20/2018 1027   NA 137 11/12/2014 1415   K 4.4 03/20/2018 1027   K 4.1 11/12/2014 1415   CL 101 03/20/2018 1027   CL 105 11/12/2014 1415   CO2 26 03/20/2018 1027   CO2 24 11/12/2014 1415   GLUCOSE 114 (H) 03/20/2018 1027   GLUCOSE 98 11/12/2014 1415   BUN 22 03/20/2018 1027   BUN 24 (H) 11/12/2014 1415   CREATININE 0.85 03/20/2018 1027   CREATININE 1.02 11/12/2014 1415   CALCIUM 9.3 03/20/2018 1027   CALCIUM 9.2 11/12/2014 1415   PROT 6.3 (L) 03/20/2018 1027   PROT 7.4 11/12/2014 1415   ALBUMIN 3.9 03/20/2018 1027   ALBUMIN 4.4 11/12/2014 1415   AST 11 (L) 03/20/2018 1027   AST 23 11/12/2014 1415   ALT 11 03/20/2018 1027   ALT 16 (L)  11/12/2014 1415   ALKPHOS 62 03/20/2018 1027   ALKPHOS 103 11/12/2014 1415   BILITOT 0.7 03/20/2018 1027   BILITOT 0.4 11/12/2014 1415   GFRNONAA >60 03/20/2018 1027   GFRNONAA >60 11/12/2014 1415   GFRAA >60 03/20/2018 1027   GFRAA >60 11/12/2014 1415    No results found for: SPEP, UPEP  Lab Results  Component Value Date   WBC 5.0 03/20/2018   NEUTROABS 4.1 03/20/2018   HGB 13.7 03/20/2018   HCT 40.0 03/20/2018   MCV 98.8 03/20/2018   PLT 219 03/20/2018      Chemistry      Component Value Date/Time   NA 135 03/20/2018 1027   NA 137 11/12/2014 1415   K 4.4 03/20/2018 1027   K 4.1 11/12/2014 1415   CL 101 03/20/2018 1027   CL 105 11/12/2014 1415   CO2 26 03/20/2018 1027   CO2 24 11/12/2014 1415   BUN 22 03/20/2018 1027   BUN 24 (H) 11/12/2014 1415   CREATININE 0.85 03/20/2018 1027   CREATININE 1.02 11/12/2014 1415      Component Value Date/Time   CALCIUM 9.3 03/20/2018 1027   CALCIUM 9.2 11/12/2014 1415   ALKPHOS 62 03/20/2018 1027   ALKPHOS 103 11/12/2014 1415   AST 11 (L) 03/20/2018 1027   AST 23 11/12/2014 1415   ALT 11 03/20/2018 1027   ALT 16 (L) 11/12/2014 1415   BILITOT 0.7 03/20/2018 1027   BILITOT 0.4 11/12/2014 1415       RADIOGRAPHIC STUDIES: I have personally reviewed the radiological images as listed and agreed with the findings in the report. No results found.   ASSESSMENT & PLAN:  Prostate cancer metastatic to multiple sites [facility]) # Castrate resistant prostate cancer-stage IV; on Trelstar; Xtandi.  July 24th 2019 CT scan chest and pelvis/bone scan-stable disease; however MRI July 2019 -L2 epidural extension ; L3 nerve root compression suggestive of progressive disease.  PSA-22/ rising.  Worsening.  #And reviewed with the patient the need for systemic therapy especially given his rising PSA.  Patient continues to decline chemotherapy.  Discussed regarding use of PARP inhibitors; he however understands this is off label.  Patient is somatic  mutation in [employer].  Discussed the potential side effects including but not limited to nausea vomiting diarrhea anemia.  #He had a long discussion with patient regarding poor prognosis of in general metastatic castrate resistant prostate cancer; patient given his reluctance with  chemotherapy.  Patient understands treatments are palliative not curative.  #Nausea-question secondary to steroid use.  Decrease steroids 4 mg once a day; also recommend Prilosec twice a day.  # Pain second malignancy-stable; Recommend continue fentanyl patch 25 mcg/tramado/Neurontin; recommend frequent use of hydrocodone.  #  STOP X-tandi in 10 days; follow up  appx 2 weeks/ Thursday; X-geva today.    No orders of the defined types were placed in this encounter.  All questions were answered. The patient knows to call the clinic with any problems, questions or concerns.      Cammie Sickle, MD 03/20/2018 7:29 PM

## 2018-03-20 NOTE — Assessment & Plan Note (Addendum)
#  Castrate resistant prostate cancer-stage IV; on Trelstar; Xtandi.  July 24th 2019 CT scan chest and pelvis/bone scan-stable disease; however MRI July 2019 -L2 epidural extension ; L3 nerve root compression suggestive of progressive disease.  PSA-22/ rising.  Worsening.  #And reviewed with the patient the need for systemic therapy especially given his rising PSA.  Patient continues to decline chemotherapy.  Discussed regarding use of PARP inhibitors; he however understands this is off label.  Patient is somatic mutation in [employer].  Discussed the potential side effects including but not limited to nausea vomiting diarrhea anemia.  #He had a long discussion with patient regarding poor prognosis of in general metastatic castrate resistant prostate cancer; patient given his reluctance with chemotherapy.  Patient understands treatments are palliative not curative.  #Nausea-question secondary to steroid use.  Decrease steroids 4 mg once a day; also recommend Prilosec twice a day.  # Pain second malignancy-stable; Recommend continue fentanyl patch 25 mcg/tramado/Neurontin; recommend frequent use of hydrocodone.  #  STOP X-tandi in 10 days; follow up  appx 2 weeks/ Thursday; X-geva today.

## 2018-03-20 NOTE — Patient Instructions (Signed)
#  Take dexamethasone 1 pill a day  #Take Prilosec twice a day for nausea; 1 hour before meals.

## 2018-03-21 ENCOUNTER — Telehealth: Payer: Self-pay | Admitting: Pharmacist

## 2018-03-21 ENCOUNTER — Ambulatory Visit
Admission: RE | Admit: 2018-03-21 | Discharge: 2018-03-21 | Disposition: A | Discharge status: Home / Self Care | Payer: Medicare Other | Source: Ambulatory Visit | Attending: Radiation Oncology | Admitting: Radiation Oncology

## 2018-03-21 ENCOUNTER — Telehealth: Payer: Self-pay | Admitting: Pharmacy Technician

## 2018-03-21 DIAGNOSIS — R011 Cardiac murmur, unspecified: Secondary | ICD-10-CM | POA: Diagnosis not present

## 2018-03-21 DIAGNOSIS — C61 Malignant neoplasm of prostate: Secondary | ICD-10-CM

## 2018-03-21 DIAGNOSIS — C7951 Secondary malignant neoplasm of bone: Secondary | ICD-10-CM | POA: Diagnosis not present

## 2018-03-21 DIAGNOSIS — Z01818 Encounter for other preprocedural examination: Secondary | ICD-10-CM | POA: Diagnosis not present

## 2018-03-21 DIAGNOSIS — Z510 Encounter for antineoplastic radiation therapy: Secondary | ICD-10-CM | POA: Diagnosis not present

## 2018-03-21 MED ORDER — OLAPARIB 150 MG PO TABS: 300.0000 mg | ORAL_TABLET | Freq: Two times a day (BID) | ORAL | 3 refills | Status: DC

## 2018-03-21 NOTE — Telephone Encounter (Signed)
Oral Oncology Pharmacist Encounter  Received new prescription for Lynparza (olaparib) for the treatment of metastatic castrate resistant prostate cancer, planned duration until disease progression or unacceptable drug toxicity. 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   He was previously on [place] but will transition to [place] due to disease progression.  CBC/ CMP from 03/20/18 assessed, no relevant lab abnormalities. Prescription dose and frequency assessed.   Current medication list in Epic reviewed, no DDIs with [place] identified.  Prescription has been e-scribed to the [facility] for benefits analysis and approval.  Oral Oncology Clinic will continue to follow for insurance authorization, copayment issues, initial counseling and start date.  Darl Pikes, PharmD, BCPS, [facility] Hematology/Oncology Clinical Pharmacist [facility] [place] [facility] [phone]  03/21/2018 8:21 AM

## 2018-03-21 NOTE — Telephone Encounter (Signed)
Oral Oncology Patient Advocate Encounter  Received notification from [facility] that prior authorization for [place] is required.  PA submitted on CoverMyMeds Key [id number] Status is pending  Oral Oncology Clinic will continue to follow.  [place] Patient [place] Phone [phone] Fax [phone] 03/21/2018 12:13 PM

## 2018-03-22 ENCOUNTER — Ambulatory Visit
Admission: RE | Admit: 2018-03-22 | Discharge: 2018-03-22 | Disposition: A | Discharge status: Home / Self Care | Payer: Medicare Other | Source: Ambulatory Visit | Attending: Radiation Oncology | Admitting: Radiation Oncology

## 2018-03-22 DIAGNOSIS — C61 Malignant neoplasm of prostate: Secondary | ICD-10-CM | POA: Diagnosis not present

## 2018-03-22 DIAGNOSIS — Z510 Encounter for antineoplastic radiation therapy: Secondary | ICD-10-CM | POA: Diagnosis not present

## 2018-03-22 DIAGNOSIS — C7951 Secondary malignant neoplasm of bone: Secondary | ICD-10-CM | POA: Diagnosis not present

## 2018-03-25 ENCOUNTER — Ambulatory Visit
Admission: RE | Admit: 2018-03-25 | Discharge: 2018-03-25 | Disposition: A | Discharge status: Home / Self Care | Payer: Medicare Other | Source: Ambulatory Visit | Attending: Radiation Oncology | Admitting: Radiation Oncology

## 2018-03-25 ENCOUNTER — Telehealth: Payer: Self-pay | Admitting: Pharmacist

## 2018-03-25 DIAGNOSIS — C7951 Secondary malignant neoplasm of bone: Secondary | ICD-10-CM | POA: Diagnosis not present

## 2018-03-25 DIAGNOSIS — C61 Malignant neoplasm of prostate: Secondary | ICD-10-CM | POA: Diagnosis not present

## 2018-03-25 DIAGNOSIS — Z510 Encounter for antineoplastic radiation therapy: Secondary | ICD-10-CM | POA: Diagnosis not present

## 2018-03-25 NOTE — Telephone Encounter (Signed)
Oral Chemotherapy Pharmacist Encounter   Patient returned my call. He will stop by Wednesday morning 03/27/18 to sign paperwork and bring in his tax forms.  Darl Pikes, PharmD, BCPS, [facility] Hematology/Oncology Clinical Pharmacist [facility] [place] [facility] [phone]  03/25/2018 2:42 PM

## 2018-03-25 NOTE — Telephone Encounter (Signed)
Oral Oncology Pharmacist Encounter   Prior Authorization for William Wright has been approved.     Effective dates: 03/22/18 through 09/20/18   Patient has a high copay for the [place] Because [name] is off-label, the copay can not be covered by copay foundations. We will contact him about signing up for manufacturer assistance.  Darl Pikes, PharmD, BCPS. [facility]OP Hematology/Oncology Clinical Pharmacist [facility] [place] [facility] [phone]  03/25/2018 9:02 AM

## 2018-03-25 NOTE — Telephone Encounter (Signed)
Oral Chemotherapy Pharmacist Encounter   Called [name] to discuss him coming in to sign an assistance application for [place] Unable to reach patient LVM for patient to call me back.   Darl Pikes, PharmD, BCPS, [facility] Hematology/Oncology Clinical Pharmacist [facility] [place] [facility] [phone]  03/25/2018 9:41 AM

## 2018-03-26 ENCOUNTER — Other Ambulatory Visit: Payer: Self-pay | Admitting: *Deleted

## 2018-03-26 ENCOUNTER — Ambulatory Visit
Admission: RE | Admit: 2018-03-26 | Discharge: 2018-03-26 | Disposition: A | Discharge status: Home / Self Care | Payer: Medicare Other | Source: Ambulatory Visit | Attending: Radiation Oncology | Admitting: Radiation Oncology

## 2018-03-26 DIAGNOSIS — Z510 Encounter for antineoplastic radiation therapy: Secondary | ICD-10-CM | POA: Diagnosis not present

## 2018-03-26 DIAGNOSIS — C61 Malignant neoplasm of prostate: Secondary | ICD-10-CM | POA: Diagnosis not present

## 2018-03-26 DIAGNOSIS — C7951 Secondary malignant neoplasm of bone: Secondary | ICD-10-CM | POA: Diagnosis not present

## 2018-03-26 MED ORDER — PROMETHAZINE HCL 25 MG PO TABS: 25.0000 mg | ORAL_TABLET | Freq: Four times a day (QID) | ORAL | 0 refills | Status: DC | PRN

## 2018-03-27 ENCOUNTER — Ambulatory Visit: Payer: Medicare Other

## 2018-03-28 ENCOUNTER — Telehealth: Payer: Self-pay | Admitting: Pharmacist

## 2018-03-28 NOTE — Telephone Encounter (Signed)
Oral Chemotherapy Pharmacist Encounter   Met with patient in the lobby to sign his AZ[employer]ME patient assistance application for [facility] assistance. He signed the paperwork and dropped off necessary tax forms. The complete application was then faxed off on 03/28/18.  Will continue to follow-up medication access.    Darl Pikes, PharmD, BCPS, [facility] Hematology/Oncology Clinical Pharmacist [facility] [place] [facility] [phone]  03/28/2018 1:54 PM

## 2018-03-28 NOTE — Telephone Encounter (Signed)
Oral Chemotherapy Pharmacist Encounter  When I met the patient in the lobby to sign assistance paper work, he asked for additional information on [place] Reviewed the below information. I will again educate the patient closer to starting the [place].  We are still waiting on patient assistance determination from AZ[employer]ME.   Patient Education I spoke with patient for overview of new oral chemotherapy medication: Lynparza (olaparib) for the treatment of metastatic castrate resistant prostate cancer BRCA somatic mutation positive, planned duration until disease progression or unacceptable drug toxicity.   Counseled patient on administration, dosing, side effects, monitoring, drug-food interactions, safe handling, storage, and disposal. Patient will take 2 tablets (300 mg total) by mouth 2 (two) times daily.  Side effects include but not limited to: nausea, vomiting, diarrhea, dysgeusia, fatigue, or muscle pain.    Reviewed with patient importance of keeping a medication schedule and plan for any missed doses.  Mr. Pratte voiced understanding and appreciation. All questions answered. Medication handout provided.   Provided patient with Oral [place] [facility] phone number. Patient knows to call the office with questions or concerns. Oral Chemotherapy Navigation Clinic will continue to follow.  Darl Pikes, PharmD, BCPS, [facility] Hematology/Oncology Clinical Pharmacist [facility] [place] [facility] [phone]  03/28/2018 4:24 PM

## 2018-03-29 DIAGNOSIS — I712 Thoracic aortic aneurysm, without rupture: Secondary | ICD-10-CM | POA: Diagnosis not present

## 2018-03-29 DIAGNOSIS — S22080A Wedge compression fracture of T11-T12 vertebra, initial encounter for closed fracture: Secondary | ICD-10-CM | POA: Diagnosis not present

## 2018-03-29 DIAGNOSIS — Z79899 Other long term (current) drug therapy: Secondary | ICD-10-CM | POA: Diagnosis not present

## 2018-03-29 DIAGNOSIS — S32030G Wedge compression fracture of third lumbar vertebra, subsequent encounter for fracture with delayed healing: Secondary | ICD-10-CM | POA: Diagnosis not present

## 2018-03-29 DIAGNOSIS — G8929 Other chronic pain: Secondary | ICD-10-CM | POA: Diagnosis not present

## 2018-03-29 DIAGNOSIS — S22080G Wedge compression fracture of T11-T12 vertebra, subsequent encounter for fracture with delayed healing: Secondary | ICD-10-CM | POA: Diagnosis not present

## 2018-03-29 DIAGNOSIS — C61 Malignant neoplasm of prostate: Secondary | ICD-10-CM | POA: Diagnosis not present

## 2018-03-29 DIAGNOSIS — C7951 Secondary malignant neoplasm of bone: Secondary | ICD-10-CM | POA: Diagnosis not present

## 2018-03-29 DIAGNOSIS — S32020G Wedge compression fracture of second lumbar vertebra, subsequent encounter for fracture with delayed healing: Secondary | ICD-10-CM | POA: Diagnosis not present

## 2018-03-29 DIAGNOSIS — S32030A Wedge compression fracture of third lumbar vertebra, initial encounter for closed fracture: Secondary | ICD-10-CM | POA: Diagnosis not present

## 2018-03-29 DIAGNOSIS — S32020A Wedge compression fracture of second lumbar vertebra, initial encounter for closed fracture: Secondary | ICD-10-CM | POA: Diagnosis not present

## 2018-04-01 ENCOUNTER — Telehealth: Payer: Self-pay | Admitting: *Deleted

## 2018-04-01 NOTE — Telephone Encounter (Signed)
Oral Oncology Patient Advocate Encounter  Called [employer] to check status of application.  Representative said she would send a message for someone to call me back regarding the status of his application.  Will check back tomorrow if no return call.  [employer]ME phone number [phone].  [place] Patient [place] Phone [phone] Fax [phone] 04/01/2018 4:08 PM

## 2018-04-01 NOTE — Telephone Encounter (Signed)
T/C made to William Wright to follow up his surgical procedure on Friday - 03/29/18. Patient reports he is very sore from the surgery and still has no energy, but states his back pain is completely gone now. He continues to experience pain in his thighs, but this has improved and states he is only taking Tylenol for pain during the day and Aleve PM at night. States he is no longer using the Duragesic patch and no longer taking the dexamethasone, Tramadol or Oxycodone for pain because he does not need it now. He does report some nausea over the weekend and has used his prn phenergan, but even that has resolved now. Patient states he thinks as the soreness and tenderness in his back resolves he will feel much better, and he plans to gradually increase his activity level this week. [name], BSN, MHA, OCN 04/01/2018 10:04 AM

## 2018-04-03 ENCOUNTER — Ambulatory Visit: Payer: Medicare Other | Admitting: Internal Medicine

## 2018-04-04 ENCOUNTER — Inpatient Hospital Stay (HOSPITAL_BASED_OUTPATIENT_CLINIC_OR_DEPARTMENT_OTHER): Payer: Medicare Other | Admitting: Internal Medicine

## 2018-04-04 ENCOUNTER — Other Ambulatory Visit: Payer: Self-pay

## 2018-04-04 ENCOUNTER — Telehealth: Payer: Self-pay | Admitting: Internal Medicine

## 2018-04-04 VITALS — BP 114/75 | HR 76 | Temp 97.6°F | Resp 18

## 2018-04-04 DIAGNOSIS — F419 Anxiety disorder, unspecified: Secondary | ICD-10-CM | POA: Diagnosis not present

## 2018-04-04 DIAGNOSIS — G893 Neoplasm related pain (acute) (chronic): Secondary | ICD-10-CM | POA: Diagnosis not present

## 2018-04-04 DIAGNOSIS — Z79899 Other long term (current) drug therapy: Secondary | ICD-10-CM | POA: Diagnosis not present

## 2018-04-04 DIAGNOSIS — C61 Malignant neoplasm of prostate: Secondary | ICD-10-CM

## 2018-04-04 DIAGNOSIS — C7951 Secondary malignant neoplasm of bone: Secondary | ICD-10-CM | POA: Diagnosis not present

## 2018-04-04 DIAGNOSIS — R110 Nausea: Secondary | ICD-10-CM | POA: Diagnosis not present

## 2018-04-04 DIAGNOSIS — Z79818 Long term (current) use of other agents affecting estrogen receptors and estrogen levels: Secondary | ICD-10-CM

## 2018-04-04 DIAGNOSIS — Z806 Family history of leukemia: Secondary | ICD-10-CM

## 2018-04-04 DIAGNOSIS — Z801 Family history of malignant neoplasm of trachea, bronchus and lung: Secondary | ICD-10-CM

## 2018-04-04 DIAGNOSIS — R918 Other nonspecific abnormal finding of lung field: Secondary | ICD-10-CM

## 2018-04-04 NOTE — Telephone Encounter (Signed)
Oral Oncology Patient Advocate Encounter  Spoke with customer service rep from [employer]ME and she will finish processing William Wright's application for [place] today.  Someone will reach out to the patient today or tomorrow to set up shipment of the medication.  I will update this encounter with approval details and shipment information as it becomes available.  [place] Patient [place] Phone [phone] Fax [phone] 04/04/2018 12:04 PM

## 2018-04-04 NOTE — Progress Notes (Signed)
William Wright OFFICE PROGRESS NOTE  Patient Care Team: System, Provider Not In as PCP - General William Cowper, MD (Urology)  Cancer Staging Cancer of prostate [facility]) Staging form: Prostate, AJCC 7th Edition - Clinical: Stage IV (T2, M1) - Signed by William Kanner, NP on 01/07/2015    Oncology History   # FEB 2014- Prostate cancer Stage II [T2N0]; PA-18; [Dr.Wolfe]  # May 2015-STAGE IV;  PSA 90 on ADT; CT/Bone scan-extensive mets;  casodex+ Trelstar  # June 2016- Castration resistant prostate cancer/[facility] protocol- ZYTIGA plus minus XTANDI; June 28th-CT-C/A/P- Stable Retrocrural/RP/Pelvic LN;Bone scan- Stable sclerotic lesions.   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/p Bx- Cryptococcus- ID/Dr.Fitzgerald- on diflucan   # Xgeva  # MOLECULAR TESTING/omniseq- BRCA-2 MUTATED; PDL-1- NEG; MSS; N-TRK-NEG  # GENETICS- BRCA-NEG; hetrozygous MUTHY; recommended family work up --------------------------------------------------    DIAGNOSIS: _0  Metastatic prostate cancer  STAGE: 4    ;GOALS: Palliative  CURRENT/MOST RECENT THERAPY-X-tandi+ Trelstar      Cancer of prostate ([place]   08/17/2012 Initial Diagnosis    Cancer of prostate, stage II    12/03/2013 Progression       10/13/2014 Progression        Prostate cancer metastatic to multiple sites ([facility]      INTERVAL HISTORY:  William [id number].o.  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  [facility].  Complains of pain at  the site of surgery; otherwise noted to have significant improvement of his back pain.  He has mild pain radiating to his bilateral lower extremities.  Otherwise no weakness.  Is currently off his fentanyl patch; dexamethasone pills; hydrocodone; and not even taking tramadol or any Tylenol.  Review of Systems  Constitutional: Negative for chills, diaphoresis, fever, malaise/fatigue and weight loss.  HENT: Negative for nosebleeds and sore throat.   Eyes: Negative for double vision.  Respiratory: Negative for cough, hemoptysis, sputum production, shortness of breath and wheezing.   Cardiovascular: Negative for chest pain, palpitations, orthopnea and leg swelling.  Gastrointestinal: Negative for abdominal pain, blood in stool, constipation, diarrhea, heartburn, melena, nausea and vomiting.  Genitourinary: Negative for dysuria, frequency and urgency.  Musculoskeletal: Positive for back pain and joint pain.  Skin: Negative.  Negative for itching and rash.  Neurological: Negative for dizziness, focal weakness, weakness and headaches.  Endo/Heme/Allergies: Does not bruise/bleed easily.  Psychiatric/Behavioral: Negative for depression. The patient is nervous/anxious. The patient does not have insomnia.       PAST MEDICAL HISTORY :  Past Medical History:  Diagnosis Date  . Cancer of prostate ([place] 07/2012  . Family history of leukemia   . [place]state cancer ([place]     PAST SURGICAL HISTORY :   Past Surgical History:  Procedure Laterality Date  . EXTRACORPOREAL SHOCK WAVE LITHOTRIPSY Left 05/20/2015   Procedure: EXTRACORPOREAL SHOCK WAVE LITHOTRIPSY (ESWL);  Surgeon: MRoyston Cowper MD;  Location: [facility] ORS;  Service: Urology;  Laterality: Left;  . EXTRACORPOREAL SHOCK WAVE LITHOTRIPSY Right 12/21/2016   Procedure: EXTRACORPOREAL SHOCK WAVE LITHOTRIPSY (ESWL);  Surgeon: William Cowper, MD;  Location: [facility] ORS;  Service: Urology;  Laterality: Right;  . IR RADIOLOGIST EVAL & MGMT  02/28/2018     FAMILY HISTORY :   Family History  Problem Relation Age of Onset  . Alzheimer's disease Father   . Lung cancer Maternal Grandfather 61  . Leukemia Paternal Grandmother 13  . COPD Paternal Grandfather   . Cancer Maternal Uncle        type unk, dx 80's    SOCIAL HISTORY:   Social History   Tobacco Use  . Smoking status: Never Smoker  . Smokeless tobacco: Never Used  Substance Use Topics  . Alcohol use: No  . Drug use: No    ALLERGIES:  is allergic to no known allergies.  MEDICATIONS:  Current Outpatient Medications  Medication Sig Dispense Refill  . dexamethasone (DECADRON) 4 MG tablet Take 1 tablet (4 mg total) by mouth 2 (two) times daily. 30 tablet 0  . enzalutamide (XTANDI) 40 MG capsule Take 4 capsules by mouth daily.    . fentaNYL (DURAGESIC - DOSED MCG/HR) 25 MCG/HR patch Place 1 patch (25 mcg total) onto the skin every 3 (three) days. 5 patch 0  . gabapentin (NEURONTIN) 100 MG capsule Take 100 mg by mouth 3 (three) times daily.    Marland Kitchen HYDROcodone-acetaminophen (NORCO/VICODIN) 5-325 MG tablet Take 1 tablet by mouth every 6 (six) hours as needed for moderate pain.    Marland Kitchen olaparib (LYNPARZA) 150 MG tablet Take 2 tablets (300 mg total) by mouth 2 (two) times daily. 120 tablet 3  . oxybutynin (DITROPAN-XL) 5 MG 24 hr tablet Take 1 tablet (5 mg total) by mouth at bedtime. 90 tablet 3  . promethazine (PHENERGAN) 25 MG tablet Take 1 tablet (25 mg total) by mouth every 6 (six) hours as needed for nausea or vomiting. 30 tablet 0  . tamsulosin (FLOMAX) 0.4 MG CAPS capsule Take 1 capsule (0.4 mg total) by mouth daily. 30 capsule 11  . traMADol (ULTRAM) 50 MG tablet Take 1 tablet (50 mg total) by mouth every 8 (eight) hours as needed. 45 tablet 1  . Triptorelin Pamoate (TRELSTAR) 22.5 MG injection Inject 22.5 mg into the muscle every 6 (six) months.    . valACYclovir (VALTREX) 1000 MG tablet Take 1 tablet (1,000 mg total) by mouth 2 (two) times daily. (Patient taking differently:  Take 1,000 mg by mouth 2 (two) times daily as needed. ) 60 tablet 6   No current facility-administered medications for this visit.     PHYSICAL EXAMINATION: ECOG PERFORMANCE STATUS: 1 - Symptomatic but completely ambulatory  BP 114/75   Pulse 76   Temp 97.6 F (36.4 C) (Tympanic)   Resp 18   There were no vitals filed for this visit.  Physical Exam  Constitutional: He is oriented to person, place, and time and well-developed, well-nourished, and in no distress.  Alone.  Walking by himself.  HENT:  Head: Normocephalic and atraumatic.  Mouth/Throat: Oropharynx is clear and moist. No oropharyngeal exudate.  Eyes: Pupils are equal, round, and reactive to light.  Neck: Normal range of motion. Neck supple.  Cardiovascular: Normal rate and regular rhythm.  Pulmonary/Chest: No respiratory distress. He has no wheezes.  Abdominal: Soft. Bowel sounds are normal. He exhibits no distension and no mass. There is no tenderness. There is no rebound and no guarding.  Musculoskeletal: Normal range of motion. He exhibits no edema or tenderness.  Neurological: He is alert and oriented to person, place, and time.  Skin: Skin is warm.  Psychiatric: Affect normal.       LABORATORY DATA:  I have reviewed the data as listed    Component Value Date/Time   NA 135 03/20/2018 1027   NA 137 11/12/2014 1415   K 4.4 03/20/2018 1027   K 4.1 11/12/2014 1415   CL 101 03/20/2018 1027   CL 105 11/12/2014 1415   CO2 26 03/20/2018 1027   CO2 24 11/12/2014 1415   GLUCOSE 114 (H) 03/20/2018 1027   GLUCOSE 98 11/12/2014 1415   BUN 22 03/20/2018 1027   BUN 24 (H) 11/12/2014 1415   CREATININE 0.85 03/20/2018 1027   CREATININE 1.02 11/12/2014 1415   CALCIUM 9.3 03/20/2018 1027   CALCIUM 9.2 11/12/2014 1415   PROT 6.3 (L) 03/20/2018 1027   PROT 7.4 11/12/2014 1415   ALBUMIN 3.9 03/20/2018 1027   ALBUMIN 4.4 11/12/2014 1415   AST 11 (L) 03/20/2018 1027   AST 23 11/12/2014 1415   ALT 11 03/20/2018 1027    ALT 16 (L) 11/12/2014 1415   ALKPHOS 62 03/20/2018 1027   ALKPHOS 103 11/12/2014 1415   BILITOT 0.7 03/20/2018 1027   BILITOT 0.4 11/12/2014 1415   GFRNONAA >60 03/20/2018 1027   GFRNONAA >60 11/12/2014 1415   GFRAA >60 03/20/2018 1027   GFRAA >60 11/12/2014 1415    No results found for: SPEP, UPEP  Lab Results  Component Value Date   WBC 5.0 03/20/2018   NEUTROABS 4.1 03/20/2018   HGB 13.7 03/20/2018   HCT 40.0 03/20/2018   MCV 98.8 03/20/2018   PLT 219 03/20/2018      Chemistry      Component Value Date/Time   NA 135 03/20/2018 1027   NA 137 11/12/2014 1415   K 4.4 03/20/2018 1027   K 4.1 11/12/2014 1415   CL 101 03/20/2018 1027   CL 105 11/12/2014 1415   CO2 26 03/20/2018 1027   CO2 24 11/12/2014 1415   BUN 22 03/20/2018 1027   BUN 24 (H) 11/12/2014 1415   CREATININE 0.85 03/20/2018 1027   CREATININE 1.02 11/12/2014 1415      Component Value Date/Time   CALCIUM 9.3 03/20/2018 1027   CALCIUM 9.2 11/12/2014 1415   ALKPHOS 62 03/20/2018 1027   ALKPHOS 103 11/12/2014 1415   AST 11 (L) 03/20/2018 1027   AST 23 11/12/2014 1415   ALT 11 03/20/2018 1027   ALT 16 (L) 11/12/2014 1415   BILITOT 0.7 03/20/2018 1027   BILITOT 0.4 11/12/2014 1415       RADIOGRAPHIC STUDIES: I have personally reviewed the radiological images as listed and agreed with the findings in the report. No results found.   ASSESSMENT & PLAN:  Prostate cancer metastatic to multiple sites [facility]) # Castrate resistant prostate cancer-stage IV; on Trelstar; Xtandi.  July 24th 2019 CT scan chest and pelvis/bone scan-stable disease; however MRI July 2019 -L2 epidural extension ; L3 nerve root compression suggestive of progressive disease.  PSA-18-22.  Clinically stable.  #Again reviewed the importance of systemic therapy; including chemotherapy.  Discussed pros and cons of chemotherapy patient continues to decline chemotherapy.  Discussed that William Wright has been approved by insurance; off label use  given his somatic BRC mutation.  However awaiting co-pay assistance.  # Nausea-question secondary to steroid use- improved.   # Pain second malignancy-improved;off fentanyl patch/ norco; recommend continue gabapentin.   # follow up in 3 week/labs/PSA/x-geva.  pallaitive care referral; also will refer to [facility] for possible clinical trials/non-chemotherapy options.  Orders Placed This Encounter  Procedures  . CBC with Differential/Platelet    Standing Status:   Future    Standing Expiration Date:   04/05/2019  . Comprehensive metabolic panel    Standing Status:   Future    Standing Expiration Date:   04/05/2019  . PSA    Standing Status:   Future    Standing Expiration Date:   04/05/2019   All questions were answered. The patient knows to call the clinic with any problems, questions or concerns.      William Sickle, MD 04/04/2018 12:21 PM

## 2018-04-04 NOTE — Addendum Note (Signed)
Addended by: Sabino Gasser on: 04/04/2018 12:38 PM   Modules accepted: Orders

## 2018-04-04 NOTE — Telephone Encounter (Signed)
Referral entered. [place], pls schedule.

## 2018-04-04 NOTE — Assessment & Plan Note (Addendum)
#   Castrate resistant prostate cancer-stage IV; on Trelstar; Xtandi.  July 24th 2019 CT scan chest and pelvis/bone scan-stable disease; however MRI July 2019 -L2 epidural extension ; L3 nerve root compression suggestive of progressive disease.  PSA-18-22.  Clinically stable.  #Again reviewed the importance of systemic therapy; including chemotherapy.  Discussed pros and cons of chemotherapy patient continues to decline chemotherapy.  Discussed that Lonie Peak has been approved by insurance; off label use given his somatic BRC mutation.  However awaiting co-pay assistance.  # Nausea-question secondary to steroid use- improved.   # Pain second malignancy-improved;off fentanyl patch/ norco; recommend continue gabapentin.   # follow up in 3 week/labs/PSA/x-geva.  pallaitive care referral; also will refer to [facility] for possible clinical trials/non-chemotherapy options.

## 2018-04-04 NOTE — Telephone Encounter (Signed)
H/B- Please refer to [place] [Dr.Harrison/Daniel or anyone who can get pt sooner] re: Prostate cancer/ clinical trial options.

## 2018-04-08 NOTE — Telephone Encounter (Signed)
Oral Oncology Patient Advocate Encounter  On Friday 04/05/18 I reached out to our point of contact for [employer], [place].  The wait time to speak with a representative at [employer] was up to 2 hours and I could not hold that long to check the status of his application.  Lennette Bihari apologized for the long wait times and had me complete a form to get the patient a free 15 day supply of Lynparza to get him started.  I sent Lennette Bihari an email informing him that I have faxed the form.  I faxed the form in today 04/08/18 to [phone].  [place] Patient [place] Phone [phone] Fax [phone] 04/08/2018 10:07 AM

## 2018-04-08 NOTE — Telephone Encounter (Signed)
Oral Oncology Patient Advocate Encounter  Patient is approved for 15 day free limited supply of Lynparza through [place] program.  [employer] will call me with shipment information.  [place] Patient [place] Phone [phone] Fax [phone] 04/08/2018 4:18 PM

## 2018-04-11 ENCOUNTER — Telehealth: Payer: Self-pay | Admitting: Pharmacist

## 2018-04-11 DIAGNOSIS — T451X5A Adverse effect of antineoplastic and immunosuppressive drugs, initial encounter: Principal | ICD-10-CM

## 2018-04-11 DIAGNOSIS — C61 Malignant neoplasm of prostate: Secondary | ICD-10-CM | POA: Diagnosis not present

## 2018-04-11 DIAGNOSIS — Z515 Encounter for palliative care: Secondary | ICD-10-CM | POA: Diagnosis not present

## 2018-04-11 DIAGNOSIS — R110 Nausea: Secondary | ICD-10-CM

## 2018-04-11 NOTE — Telephone Encounter (Signed)
Oral Oncology Patient Advocate Encounter  Patient received free 15 day supply of Lynparza on 04/11/18. Patient knows he can call with any questions regarding the medication.  I will follow up with [employer] to check the process of his enrollment application.  [place] Patient [place] Phone [phone] Fax [phone] 04/11/2018 2:27 PM

## 2018-04-11 NOTE — Telephone Encounter (Signed)
Oral Chemotherapy Pharmacist Encounter   Mr. Arnaud received a 15 day quick start of his Lynparza yesterday, 04/10/18. We are still waiting for full assistance approval from [employer]. Spoke with Dr. [name] and he would like for the patient to get started on the Lynparza. Called Mr. Mayol and let him know it was okay for him to get started. I re-educated him on the [place] He stated his understanding.  Darl Pikes, PharmD, BCPS, [facility]/Oncology Clinical Pharmacist [facility] [phone]  04/11/2018 2:37 PM

## 2018-04-12 MED ORDER — ONDANSETRON HCL 8 MG PO TABS: 8.0000 mg | ORAL_TABLET | Freq: Three times a day (TID) | ORAL | 0 refills | Status: DC | PRN

## 2018-04-12 NOTE — Addendum Note (Signed)
Addended by: Sabino Gasser on: 04/12/2018 08:53 AM   Modules accepted: Orders

## 2018-04-12 NOTE — Telephone Encounter (Signed)
Script for zofran sent to patien't[facility] preferred pharmacy

## 2018-04-16 ENCOUNTER — Telehealth: Payer: Self-pay | Admitting: *Deleted

## 2018-04-16 NOTE — Telephone Encounter (Signed)
Received t/c from patient William Wright reporting that he is still having pain in his hips and thighs. Reports the pain in his back is gone, but the pain in his hips and legs is as bad as ever. States he is taking gabapentin and Tylenol for pain now, but also still has Duragesic patches left as well as some oxycodone. Patient also states he may try some Ibuprofen to see if that makes a difference. Mr. Able reports he received the Lymparza in the mail today, but is not going to start taking this until after he has his consult at [facility] later this month. Will inform Dr. [name] of this tomorrow morning. [name], BSN, MHA, OCN 04/16/2018 4:43 PM

## 2018-04-19 ENCOUNTER — Telehealth: Payer: Self-pay | Admitting: Internal Medicine

## 2018-04-19 NOTE — Telephone Encounter (Signed)
Please add BMP; x-geva at next visit in 2 weeks. Thx

## 2018-04-25 ENCOUNTER — Inpatient Hospital Stay: Payer: Medicare Other

## 2018-04-25 ENCOUNTER — Inpatient Hospital Stay: Payer: Medicare Other | Admitting: Internal Medicine

## 2018-04-25 NOTE — Progress Notes (Deleted)
[facility] OFFICE PROGRESS NOTE  Patient Care Team: System, Provider Not In as PCP - General William Cowper, MD (Urology)  Cancer Staging Cancer of prostate [facility]) Staging form: Prostate, AJCC 7th Edition - Clinical: Stage IV (T2, M1) - Signed by Evlyn Kanner, NP on 01/07/2015    Oncology History   # FEB 2014- Prostate cancer Stage II [T2N0]; PA-18; [Dr.Wolfe]  # May 2015-STAGE IV;  PSA 90 on ADT; CT/Bone scan-extensive mets;  casodex+ Trelstar  # June 2016- Castration resistant prostate cancer/[facility] protocol- ZYTIGA plus minus XTANDI; June 28th-CT-C/A/P- Stable Retrocrural/RP/Pelvic LN;Bone scan- Stable sclerotic lesions.   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/p Bx- Cryptococcus- ID/Dr.Fitzgerald- on diflucan   # Xgeva  # MOLECULAR TESTING/omniseq- BRCA-2 MUTATED; PDL-1- NEG; MSS; N-TRK-NEG  # GENETICS- BRCA-NEG; hetrozygous MUTHY; recommended family work up --------------------------------------------------    DIAGNOSIS: _0  Metastatic prostate cancer  STAGE: 4    ;GOALS: Palliative  CURRENT/MOST RECENT THERAPY-X-tandi+ Trelstar      [place] of prostate ([place]   08/17/2012 Initial Diagnosis    Cancer of prostate, stage II    12/03/2013 Progression       10/13/2014 Progression        Prostate cancer metastatic to multiple sites ([facility]      INTERVAL HISTORY:  William CHANCELLOR89y.o.  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  [facility].  Complains of pain at  the site of surgery; otherwise noted to have significant improvement of his back pain.  He has mild pain radiating to his bilateral lower extremities.  Otherwise no weakness.  Is currently off his fentanyl patch; dexamethasone pills; hydrocodone; and not even taking tramadol or any Tylenol.  Review of Systems  Constitutional: Negative for chills, diaphoresis, fever, malaise/fatigue and weight loss.  HENT: Negative for nosebleeds and sore throat.   Eyes: Negative for double vision.  Respiratory: Negative for cough, hemoptysis, sputum production, shortness of breath and wheezing.   Cardiovascular: Negative for chest pain, palpitations, orthopnea and leg swelling.  Gastrointestinal: Negative for abdominal pain, blood in stool, constipation, diarrhea, heartburn, melena, nausea and vomiting.  Genitourinary: Negative for dysuria, frequency and urgency.  Musculoskeletal: Positive for back pain and joint pain.  Skin: Negative.  Negative for itching and rash.  Neurological: Negative for dizziness, focal weakness, weakness and headaches.  Endo/Heme/Allergies: Does not bruise/bleed easily.  Psychiatric/Behavioral: Negative for depression. The patient is nervous/anxious. The patient does not have insomnia.       PAST MEDICAL HISTORY :  Past Medical History:  Diagnosis Date  . Cancer of prostate ([place] 07/2012  . Family history of leukemia   . [place]state cancer ([place]     PAST SURGICAL HISTORY :   Past Surgical History:  Procedure Laterality Date  . EXTRACORPOREAL SHOCK WAVE LITHOTRIPSY Left 05/20/2015   Procedure: EXTRACORPOREAL SHOCK WAVE LITHOTRIPSY (ESWL);  Surgeon: MRoyston Cowper MD;  Location: [facility] ORS;  Service: Urology;  Laterality: Left;  . EXTRACORPOREAL SHOCK WAVE LITHOTRIPSY Right 12/21/2016   Procedure: EXTRACORPOREAL SHOCK WAVE LITHOTRIPSY (ESWL);  Surgeon: William Cowper, MD;  Location: [facility] ORS;  Service: Urology;  Laterality: Right;  . IR RADIOLOGIST EVAL & MGMT  02/28/2018     FAMILY HISTORY :   Family History  Problem Relation Age of Onset  . Alzheimer's disease Father   . Lung cancer Maternal Grandfather 105  . Leukemia Paternal Grandmother 73  . COPD Paternal Grandfather   . Cancer Maternal Uncle        type unk, dx 80's    SOCIAL HISTORY:   Social History   Tobacco Use  . Smoking status: Never Smoker  . Smokeless tobacco: Never Used  Substance Use Topics  . Alcohol use: No  . Drug use: No    ALLERGIES:  is allergic to no known allergies.  MEDICATIONS:  Current Outpatient Medications  Medication Sig Dispense Refill  . dexamethasone (DECADRON) 4 MG tablet Take 1 tablet (4 mg total) by mouth 2 (two) times daily. 30 tablet 0  . enzalutamide (XTANDI) 40 MG capsule Take 4 capsules by mouth daily.    . fentaNYL (DURAGESIC - DOSED MCG/HR) 25 MCG/HR patch Place 1 patch (25 mcg total) onto the skin every 3 (three) days. 5 patch 0  . gabapentin (NEURONTIN) 100 MG capsule Take 100 mg by mouth 3 (three) times daily.    Marland Kitchen HYDROcodone-acetaminophen (NORCO/VICODIN) 5-325 MG tablet Take 1 tablet by mouth every 6 (six) hours as needed for moderate pain.    Marland Kitchen olaparib (LYNPARZA) 150 MG tablet Take 2 tablets (300 mg total) by mouth 2 (two) times daily. 120 tablet 3  . ondansetron (ZOFRAN) 8 MG tablet Take 1 tablet (8 mg total) by mouth every 8 (eight) hours as needed for nausea or vomiting. 20 tablet 0  . oxybutynin (DITROPAN-XL) 5 MG 24 hr tablet Take 1 tablet (5 mg total) by mouth at bedtime. 90 tablet 3  . promethazine (PHENERGAN) 25 MG tablet Take 1 tablet (25 mg total) by mouth every 6 (six) hours as needed for nausea or vomiting. 30 tablet 0  . tamsulosin (FLOMAX) 0.4 MG CAPS capsule Take 1 capsule (0.4 mg total) by mouth daily. 30 capsule 11  . traMADol (ULTRAM) 50 MG tablet Take 1 tablet (50 mg total) by mouth every 8 (eight) hours as needed. 45 tablet 1  . Triptorelin Pamoate (TRELSTAR) 22.5 MG injection Inject 22.5 mg into the muscle every 6 (six)  months.    . valACYclovir (VALTREX) 1000 MG tablet Take 1 tablet (1,000 mg total) by mouth 2 (two) times daily. (Patient taking differently: Take 1,000 mg by mouth 2 (two) times daily as needed. ) 60 tablet 6   No current facility-administered medications for this visit.     PHYSICAL EXAMINATION: ECOG PERFORMANCE STATUS: 1 - Symptomatic but completely ambulatory  There were no vitals taken for this visit.  There were no vitals filed for this visit.  Physical Exam  Constitutional: He is oriented to person, place, and time and well-developed, well-nourished, and in no distress.  Alone.  Walking by himself.  HENT:  Head: Normocephalic and atraumatic.  Mouth/Throat: Oropharynx is clear and moist. No oropharyngeal exudate.  Eyes: Pupils are equal, round, and reactive to light.  Neck: Normal range of motion. Neck supple.  Cardiovascular: Normal rate and regular rhythm.  Pulmonary/Chest: No respiratory distress. He has no wheezes.  Abdominal: Soft. Bowel sounds are normal. He exhibits no distension and no mass. There is no tenderness. There is no rebound and no guarding.  Musculoskeletal: Normal range of motion. He  exhibits no edema or tenderness.  Neurological: He is alert and oriented to person, place, and time.  Skin: Skin is warm.  Psychiatric: Affect normal.       LABORATORY DATA:  I have reviewed the data as listed    Component Value Date/Time   NA 135 03/20/2018 1027   NA 137 11/12/2014 1415   K 4.4 03/20/2018 1027   K 4.1 11/12/2014 1415   CL 101 03/20/2018 1027   CL 105 11/12/2014 1415   CO2 26 03/20/2018 1027   CO2 24 11/12/2014 1415   GLUCOSE 114 (H) 03/20/2018 1027   GLUCOSE 98 11/12/2014 1415   BUN 22 03/20/2018 1027   BUN 24 (H) 11/12/2014 1415   CREATININE 0.85 03/20/2018 1027   CREATININE 1.02 11/12/2014 1415   CALCIUM 9.3 03/20/2018 1027   CALCIUM 9.2 11/12/2014 1415   PROT 6.3 (L) 03/20/2018 1027   PROT 7.4 11/12/2014 1415   ALBUMIN 3.9 03/20/2018  1027   ALBUMIN 4.4 11/12/2014 1415   AST 11 (L) 03/20/2018 1027   AST 23 11/12/2014 1415   ALT 11 03/20/2018 1027   ALT 16 (L) 11/12/2014 1415   ALKPHOS 62 03/20/2018 1027   ALKPHOS 103 11/12/2014 1415   BILITOT 0.7 03/20/2018 1027   BILITOT 0.4 11/12/2014 1415   GFRNONAA >60 03/20/2018 1027   GFRNONAA >60 11/12/2014 1415   GFRAA >60 03/20/2018 1027   GFRAA >60 11/12/2014 1415    No results found for: SPEP, UPEP  Lab Results  Component Value Date   WBC 5.0 03/20/2018   NEUTROABS 4.1 03/20/2018   HGB 13.7 03/20/2018   HCT 40.0 03/20/2018   MCV 98.8 03/20/2018   PLT 219 03/20/2018      Chemistry      Component Value Date/Time   NA 135 03/20/2018 1027   NA 137 11/12/2014 1415   K 4.4 03/20/2018 1027   K 4.1 11/12/2014 1415   CL 101 03/20/2018 1027   CL 105 11/12/2014 1415   CO2 26 03/20/2018 1027   CO2 24 11/12/2014 1415   BUN 22 03/20/2018 1027   BUN 24 (H) 11/12/2014 1415   CREATININE 0.85 03/20/2018 1027   CREATININE 1.02 11/12/2014 1415      Component Value Date/Time   CALCIUM 9.3 03/20/2018 1027   CALCIUM 9.2 11/12/2014 1415   ALKPHOS 62 03/20/2018 1027   ALKPHOS 103 11/12/2014 1415   AST 11 (L) 03/20/2018 1027   AST 23 11/12/2014 1415   ALT 11 03/20/2018 1027   ALT 16 (L) 11/12/2014 1415   BILITOT 0.7 03/20/2018 1027   BILITOT 0.4 11/12/2014 1415       RADIOGRAPHIC STUDIES: I have personally reviewed the radiological images as listed and agreed with the findings in the report. No results found.   ASSESSMENT & PLAN:  No problem-specific Assessment & Plan notes found for this encounter.   No orders of the defined types were placed in this encounter.  All questions were answered. The patient knows to call the clinic with any problems, questions or concerns.      William Sickle, MD 04/25/2018 8:27 AM

## 2018-04-25 NOTE — Assessment & Plan Note (Deleted)
#   Castrate resistant prostate cancer-stage IV; on Trelstar; Xtandi.  July 24th 2019 CT scan chest and pelvis/bone scan-stable disease; however MRI July 2019 -L2 epidural extension ; L3 nerve root compression suggestive of progressive disease.  PSA-18-22.  Clinically stable.  #Again reviewed the importance of systemic therapy; including chemotherapy.  Discussed pros and cons of chemotherapy patient continues to decline chemotherapy.  Discussed that [facility] has been approved by insurance; off label use given his somatic BRC mutation.  However awaiting co-pay assistance.  # Nausea-question secondary to steroid use- improved.   # Pain second malignancy-improved;off fentanyl patch/ norco; recommend continue gabapentin.   # follow up in 3 week/labs/PSA/x-geva.  pallaitive care referral; also will refer to [facility] for possible clinical trials/non-chemotherapy options.

## 2018-04-26 ENCOUNTER — Ambulatory Visit: Payer: Medicare Other | Admitting: Radiation Oncology

## 2018-05-06 ENCOUNTER — Inpatient Hospital Stay: Payer: Medicare Other | Attending: Internal Medicine

## 2018-05-06 ENCOUNTER — Encounter: Payer: Self-pay | Admitting: Internal Medicine

## 2018-05-06 ENCOUNTER — Inpatient Hospital Stay: Payer: Medicare Other

## 2018-05-06 ENCOUNTER — Inpatient Hospital Stay (HOSPITAL_BASED_OUTPATIENT_CLINIC_OR_DEPARTMENT_OTHER): Payer: Medicare Other | Admitting: Internal Medicine

## 2018-05-06 VITALS — BP 138/79 | HR 62 | Temp 96.9°F | Resp 18 | Wt 158.0 lb

## 2018-05-06 DIAGNOSIS — C7951 Secondary malignant neoplasm of bone: Secondary | ICD-10-CM | POA: Diagnosis not present

## 2018-05-06 DIAGNOSIS — G893 Neoplasm related pain (acute) (chronic): Secondary | ICD-10-CM

## 2018-05-06 DIAGNOSIS — Z79818 Long term (current) use of other agents affecting estrogen receptors and estrogen levels: Secondary | ICD-10-CM | POA: Diagnosis not present

## 2018-05-06 DIAGNOSIS — Z79899 Other long term (current) drug therapy: Secondary | ICD-10-CM | POA: Diagnosis not present

## 2018-05-06 DIAGNOSIS — C61 Malignant neoplasm of prostate: Secondary | ICD-10-CM

## 2018-05-06 LAB — CBC WITH DIFFERENTIAL/PLATELET
Abs Immature Granulocytes: 0.02 10*3/uL (ref 0.00–0.07)
BASOS ABS: 0 10*3/uL (ref 0.0–0.1)
BASOS PCT: 1 %
Eosinophils Absolute: 0.2 10*3/uL (ref 0.0–0.5)
Eosinophils Relative: 6 %
HCT: 35.2 % — ABNORMAL LOW (ref 39.0–52.0)
Hemoglobin: 11.9 g/dL — ABNORMAL LOW (ref 13.0–17.0)
IMMATURE GRANULOCYTES: 1 %
Lymphocytes Relative: 17 %
Lymphs Abs: 0.6 10*3/uL — ABNORMAL LOW (ref 0.7–4.0)
MCH: 33.2 pg (ref 26.0–34.0)
MCHC: 33.8 g/dL (ref 30.0–36.0)
MCV: 98.3 fL (ref 80.0–100.0)
Monocytes Absolute: 0.6 10*3/uL (ref 0.1–1.0)
Monocytes Relative: 15 %
NEUTROS ABS: 2.2 10*3/uL (ref 1.7–7.7)
NEUTROS PCT: 60 %
NRBC: 0 % (ref 0.0–0.2)
PLATELETS: 200 10*3/uL (ref 150–400)
RBC: 3.58 MIL/uL — AB (ref 4.22–5.81)
RDW: 13.7 % (ref 11.5–15.5)
WBC: 3.7 10*3/uL — AB (ref 4.0–10.5)

## 2018-05-06 LAB — COMPREHENSIVE METABOLIC PANEL
ALK PHOS: 45 U/L (ref 38–126)
ALT: 8 U/L (ref 0–44)
AST: 15 U/L (ref 15–41)
Albumin: 3.8 g/dL (ref 3.5–5.0)
Anion gap: 6 (ref 5–15)
BILIRUBIN TOTAL: 0.8 mg/dL (ref 0.3–1.2)
BUN: 20 mg/dL (ref 8–23)
CALCIUM: 9.1 mg/dL (ref 8.9–10.3)
CO2: 26 mmol/L (ref 22–32)
Chloride: 105 mmol/L (ref 98–111)
Creatinine, Ser: 0.84 mg/dL (ref 0.61–1.24)
GFR calc non Af Amer: >60 mL/min (ref >60)
GLUCOSE: 112 mg/dL — AB (ref 70–99)
Potassium: 4.2 mmol/L (ref 3.5–5.1)
Sodium: 137 mmol/L (ref 135–145)
TOTAL PROTEIN: 6.4 g/dL — AB (ref 6.5–8.1)

## 2018-05-06 LAB — PSA: Prostatic Specific Antigen: 6.37 ng/mL — ABNORMAL HIGH (ref 0.00–4.00)

## 2018-05-06 MED ORDER — DENOSUMAB 120 MG/1.7ML ~~LOC~~ SOLN
120.0000 mg | Freq: Once | SUBCUTANEOUS | Status: AC
  Administered 2018-05-06: 120 mg via SUBCUTANEOUS
  Filled 2018-05-06: qty 1.7

## 2018-05-06 NOTE — Progress Notes (Signed)
[facility] OFFICE PROGRESS NOTE  Patient Care Team: System, Provider Not In as PCP - General Royston Cowper, MD (Urology)  Cancer Staging Cancer of prostate [facility]) Staging form: Prostate, AJCC 7th Edition - Clinical: Stage IV (T2, M1) - Signed by Evlyn Kanner, NP on 01/07/2015    Oncology History   # FEB 2014- Prostate cancer Stage II [T2N0]; PA-18; [Dr.Wolfe]  # May 2015-STAGE IV;  PSA 90 on ADT; CT/Bone scan-extensive mets;  casodex+ Trelstar  # June 2016- Castration resistant prostate cancer/[facility] protocol- ZYTIGA plus minus XTANDI; June 28th-CT-C/A/P- Stable Retrocrural/RP/Pelvic LN;Bone scan- Stable sclerotic lesions.   # MARCH 2019- Taken off trial [ based on interm analysis]; continue X-tandi [given not needing steroids]  # July 2019- CT/bone scan stable; MRI lumbar spine- epidural extension/L3 spinal nerves involvement; on RT [finish sep 12th]  # vertebroplasty [[facility]] on sep 13th 2019   # RLL [~31m lung nodule] s/p Bx- Cryptococcus- ID/Dr.Fitzgerald- on diflucan   # Xgeva  # MOLECULAR TESTING/omniseq- BRCA-2 MUTATED; PDL-1- NEG; MSS; N-TRK-NEG  # GENETICS- BRCA-NEG; hetrozygous MUTHY; recommended family work up --------------------------------------------------    DIAGNOSIS: '[ ]'  Metastatic prostate cancer  STAGE: 4    ;GOALS: Palliative  CURRENT/MOST RECENT THERAPY-X-tandi+ Trelstar      [place] of prostate ([place]   08/17/2012 Initial Diagnosis    Cancer of prostate, stage II    12/03/2013 Progression       10/13/2014 Progression        Prostate cancer metastatic to multiple sites ([facility]      INTERVAL HISTORY:  William HANDLIN89y.o.  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  Patient's daughter recently got married.  He was disappointed that he could not attend his high school  reunion [facility] He continues to complain of intermittent pain in his back.  No radiation.  He is taking Tylenol as needed.  He is not using fentanyl patch/dexamethasone/hydrocodone on Neurontin.  [facility]  Review of Systems  Constitutional: Negative for chills, diaphoresis, fever, malaise/fatigue and weight loss.  HENT: Negative for nosebleeds and sore throat.   Eyes: Negative for double vision.  Respiratory: Negative for cough, hemoptysis, sputum production, shortness of breath and wheezing.   Cardiovascular: Negative for chest pain, palpitations, orthopnea and leg swelling.  Gastrointestinal: Negative for abdominal pain, blood in stool, constipation, diarrhea, heartburn, melena, nausea and vomiting.  Genitourinary: Negative for dysuria, frequency and urgency.  Musculoskeletal: Positive for back pain and joint pain.  Skin: Negative.  Negative for itching and rash.  Neurological: Negative for dizziness, focal weakness, weakness and headaches.  Endo/Heme/Allergies: Does not bruise/bleed easily.  Psychiatric/Behavioral: Negative for depression. The patient is nervous/anxious. The patient does not have insomnia.       PAST MEDICAL HISTORY :  Past Medical History:  Diagnosis Date  . [place] of [place] ([place] 07[place]  . Family history of leukemia   . Prostate cancer ([place]     PAST SURGICAL HISTORY :   Past Surgical History:  Procedure Laterality Date  . EXTRACORPOREAL SHOCK WAVE LITHOTRIPSY Left 05/20/2015   Procedure: EXTRACORPOREAL SHOCK WAVE LITHOTRIPSY (ESWL);  Surgeon: MRoyston Cowper MD;  Location: [facility] ORS;  Service: Urology;  Laterality: Left;  . EXTRACORPOREAL SHOCK WAVE LITHOTRIPSY Right 12/21/2016   Procedure: EXTRACORPOREAL SHOCK WAVE  LITHOTRIPSY (ESWL);  Surgeon: Royston Cowper, MD;  Location: [facility] ORS;  Service: Urology;  Laterality: Right;  . IR RADIOLOGIST EVAL & MGMT  02/28/2018     FAMILY HISTORY :   Family History  Problem Relation Age of Onset  . Alzheimer's disease Father   . Lung cancer Maternal Grandfather 82  . Leukemia Paternal Grandmother 69  . COPD Paternal Grandfather   . Cancer Maternal Uncle        type unk, dx 80's    SOCIAL HISTORY:   Social History   Tobacco Use  . Smoking status: Never Smoker  . Smokeless tobacco: Never Used  Substance Use Topics  . Alcohol use: No  . Drug use: No    ALLERGIES:  is allergic to no known allergies.  MEDICATIONS:  Current Outpatient Medications  Medication Sig Dispense Refill  . enzalutamide (XTANDI) 40 MG capsule Take 4 capsules by mouth daily.    Marland Kitchen oxybutynin (DITROPAN-XL) 5 MG 24 hr tablet Take 1 tablet (5 mg total) by mouth at bedtime. 90 tablet 3  . tamsulosin (FLOMAX) 0.4 MG CAPS capsule Take 1 capsule (0.4 mg total) by mouth daily. 30 capsule 11  . Triptorelin Pamoate (TRELSTAR) 22.5 MG injection Inject 22.5 mg into the muscle every 6 (six) months.    . gabapentin (NEURONTIN) 100 MG capsule Take 100 mg by mouth 3 (three) times daily.    Marland Kitchen HYDROcodone-acetaminophen (NORCO/VICODIN) 5-325 MG tablet Take 1 tablet by mouth every 6 (six) hours as needed for moderate pain.    Marland Kitchen olaparib (LYNPARZA) 150 MG tablet Take 2 tablets (300 mg total) by mouth 2 (two) times daily. (Patient not taking: Reported on 05/06/2018) 120 tablet 3  . ondansetron (ZOFRAN) 8 MG tablet Take 1 tablet (8 mg total) by mouth every 8 (eight) hours as needed for nausea or vomiting. (Patient not taking: Reported on 05/06/2018) 20 tablet 0  . promethazine (PHENERGAN) 25 MG tablet Take 1 tablet (25 mg total) by mouth every 6 (six) hours as needed for nausea or vomiting. (Patient not taking: Reported on 05/06/2018) 30 tablet 0  . traMADol (ULTRAM) 50 MG tablet Take 1 tablet (50 mg total) by mouth every 8 (eight) hours as needed. 45 tablet 1  . valACYclovir (VALTREX) 1000 MG tablet Take 1 tablet (1,000 mg total) by mouth 2 (two) times  daily. (Patient not taking: Reported on 05/06/2018) 60 tablet 6   No current facility-administered medications for this visit.     PHYSICAL EXAMINATION: ECOG PERFORMANCE STATUS: 1 - Symptomatic but completely ambulatory  BP 138/79 (BP Location: Left Arm, Patient Position: Sitting)   Pulse 62   Temp (!) 96.9 F (36.1 C) (Tympanic)   Resp 18   Wt 158 lb (71.7 kg)   BMI 21.43 kg/m   Filed Weights   05/06/18 1045  Weight: 158 lb (71.7 kg)    Physical Exam  Constitutional: He is oriented to person, place, and time and well-developed, well-nourished, and in no distress.  Alone.  Walking by himself.  HENT:  Head: Normocephalic and atraumatic.  Mouth/Throat: Oropharynx is clear and moist. No oropharyngeal exudate.  Eyes: Pupils are equal, round, and reactive to light.  Neck: Normal range of motion. Neck supple.  Cardiovascular: Normal rate and regular rhythm.  Pulmonary/Chest: No respiratory distress. He has no wheezes.  Abdominal: Soft. Bowel sounds are normal. He exhibits no distension and no mass. There is no tenderness. There is no rebound and no guarding.  Musculoskeletal: Normal range of motion.  He exhibits no edema or tenderness.  Neurological: He is alert and oriented to person, place, and time.  Skin: Skin is warm.  Psychiatric: Affect normal.       LABORATORY DATA:  I have reviewed the data as listed    Component Value Date/Time   NA 137 05/06/2018 1020   NA 137 11/12/2014 1415   K 4.2 05/06/2018 1020   K 4.1 11/12/2014 1415   CL 105 05/06/2018 1020   CL 105 11/12/2014 1415   CO2 26 05/06/2018 1020   CO2 24 11/12/2014 1415   GLUCOSE 112 (H) 05/06/2018 1020   GLUCOSE 98 11/12/2014 1415   BUN 20 05/06/2018 1020   BUN 24 (H) 11/12/2014 1415   CREATININE 0.84 05/06/2018 1020   CREATININE 1.02 11/12/2014 1415   CALCIUM 9.1 05/06/2018 1020   CALCIUM 9.2 11/12/2014 1415   PROT 6.4 (L) 05/06/2018 1020   PROT 7.4 11/12/2014 1415   ALBUMIN 3.8 05/06/2018 1020    ALBUMIN 4.4 11/12/2014 1415   AST 15 05/06/2018 1020   AST 23 11/12/2014 1415   ALT 8 05/06/2018 1020   ALT 16 (L) 11/12/2014 1415   ALKPHOS 45 05/06/2018 1020   ALKPHOS 103 11/12/2014 1415   BILITOT 0.8 05/06/2018 1020   BILITOT 0.4 11/12/2014 1415   GFRNONAA >60 05/06/2018 1020   GFRNONAA >60 11/12/2014 1415   GFRAA >60 05/06/2018 1020   GFRAA >60 11/12/2014 1415    No results found for: SPEP, UPEP  Lab Results  Component Value Date   WBC 3.7 (L) 05/06/2018   NEUTROABS 2.2 05/06/2018   HGB 11.9 (L) 05/06/2018   HCT 35.2 (L) 05/06/2018   MCV 98.3 05/06/2018   PLT 200 05/06/2018      Chemistry      Component Value Date/Time   NA 137 05/06/2018 1020   NA 137 11/12/2014 1415   K 4.2 05/06/2018 1020   K 4.1 11/12/2014 1415   CL 105 05/06/2018 1020   CL 105 11/12/2014 1415   CO2 26 05/06/2018 1020   CO2 24 11/12/2014 1415   BUN 20 05/06/2018 1020   BUN 24 (H) 11/12/2014 1415   CREATININE 0.84 05/06/2018 1020   CREATININE 1.02 11/12/2014 1415      Component Value Date/Time   CALCIUM 9.1 05/06/2018 1020   CALCIUM 9.2 11/12/2014 1415   ALKPHOS 45 05/06/2018 1020   ALKPHOS 103 11/12/2014 1415   AST 15 05/06/2018 1020   AST 23 11/12/2014 1415   ALT 8 05/06/2018 1020   ALT 16 (L) 11/12/2014 1415   BILITOT 0.8 05/06/2018 1020   BILITOT 0.4 11/12/2014 1415       RADIOGRAPHIC STUDIES: I have personally reviewed the radiological images as listed and agreed with the findings in the report. No results found.   ASSESSMENT & PLAN:  Prostate cancer metastatic to multiple sites [facility]) # Castrate resistant prostate cancer-stage IV; on Trelstar q 55M [03/09/2018]; currently on Xtandi.  July 24th 2019 CT scan chest and pelvis/bone scan-stable disease; however MRI July 2019 -L2 epidural extension ; L3 nerve root compression suggestive of progressive disease.  PSA-18-22.  STABLE.  #Patient continues to decline chemotherapy; awaiting evaluation with Dr.Harrison 10/23.   Somatic BRCA-2 mutation;  approved of olaparib; wants to hold off until [facility] evaluation.   # Pain second malignancy-improved;off fentanyl patch/ norco/gabapentin. On tylenol prn.  Proceed with Xgeva today.  DISPOSITION: # Treatment today # Follow up in -4 weeks; MD/ labs- cbc/cmp/x-gevaDr.B    Orders Placed  This Encounter  Procedures  . CBC with Differential/Platelet    Standing Status:   Future    Standing Expiration Date:   05/07/2019  . Comprehensive metabolic panel    Standing Status:   Future    Standing Expiration Date:   05/07/2019   All questions were answered. The patient knows to call the clinic with any problems, questions or concerns.      Cammie Sickle, MD 05/06/2018 11:34 AM

## 2018-05-06 NOTE — Assessment & Plan Note (Addendum)
#  Castrate resistant prostate cancer-stage IV; on Trelstar q 75M [03/09/2018]; currently on Xtandi.  July 24th 2019 CT scan chest and pelvis/bone scan-stable disease; however MRI July 2019 -L2 epidural extension ; L3 nerve root compression suggestive of progressive disease.  PSA-18-22.  STABLE.  #Patient continues to decline chemotherapy; awaiting evaluation with Dr.Harrison 10/23.  Somatic BRCA-2 mutation;  approved of olaparib; wants to hold off until [facility] evaluation.   # Pain second malignancy-improved;off fentanyl patch/ norco/gabapentin. On tylenol prn.  Proceed with Xgeva today.  DISPOSITION: # Treatment today # Follow up in -4 weeks; MD/ labs- cbc/cmp/x-gevaDr.B

## 2018-05-06 NOTE — Progress Notes (Signed)
Pt in for follow up, denies any concerns or difficulties today. 

## 2018-05-07 ENCOUNTER — Telehealth: Payer: Self-pay | Admitting: Internal Medicine

## 2018-05-07 NOTE — Telephone Encounter (Signed)
Mychart message sent.

## 2018-05-08 ENCOUNTER — Telehealth: Payer: Self-pay | Admitting: Pharmacy Technician

## 2018-05-08 DIAGNOSIS — Z79899 Other long term (current) drug therapy: Secondary | ICD-10-CM | POA: Diagnosis not present

## 2018-05-08 DIAGNOSIS — Z7189 Other specified counseling: Secondary | ICD-10-CM | POA: Diagnosis not present

## 2018-05-08 DIAGNOSIS — C61 Malignant neoplasm of prostate: Secondary | ICD-10-CM | POA: Diagnosis not present

## 2018-05-08 DIAGNOSIS — Z79818 Long term (current) use of other agents affecting estrogen receptors and estrogen levels: Secondary | ICD-10-CM | POA: Diagnosis not present

## 2018-05-08 DIAGNOSIS — Z923 Personal history of irradiation: Secondary | ICD-10-CM | POA: Diagnosis not present

## 2018-05-08 DIAGNOSIS — C7951 Secondary malignant neoplasm of bone: Secondary | ICD-10-CM | POA: Diagnosis not present

## 2018-05-08 NOTE — Telephone Encounter (Signed)
Oral Oncology Patient Advocate Encounter  Received notification from [employer] Patient Assistance program that patient has been successfully enrolled into their program to receive Lynparza from the manufacturer at $0 out of pocket until 07/16/18.   He knows we will have to re-apply. We can re-apply 45 days before assistance ends.  Patient knows to call the office with questions or concerns.   Oral Oncology Clinic will continue to follow.  [place] Patient [place] Phone [phone] Fax [phone] 05/08/2018 4:17 PM

## 2018-05-15 ENCOUNTER — Telehealth: Payer: Self-pay | Admitting: Internal Medicine

## 2018-05-15 DIAGNOSIS — C61 Malignant neoplasm of prostate: Secondary | ICD-10-CM

## 2018-05-15 MED ORDER — ENZALUTAMIDE 40 MG PO CAPS: 160.0000 mg | ORAL_CAPSULE | Freq: Every day | ORAL | 2 refills | Status: DC

## 2018-05-15 NOTE — Addendum Note (Signed)
Addended by: Darl Pikes on: 05/15/2018 04:33 PM   Modules accepted: Orders

## 2018-05-15 NOTE — Telephone Encounter (Signed)
Pt called regarding Xtandi refills. doesnot want to switch to olaparib.   As per patient, in the absence of clinical trials, [facility]  recommends continued Xtandi.    Alysson- please send a script to pharmacy.   Follow-up as planned. Thx

## 2018-05-15 NOTE — Telephone Encounter (Addendum)
Oral Chemotherapy Pharmacist Encounter   Prescription for [facility] sent to [facility], the pharmacy that dispenses medication for the manufacturer assistance program.  Darl Pikes, PharmD, BCPS, [facility] Hematology/Oncology Clinical Pharmacist [facility] [phone]  05/15/2018 4:17 PM

## 2018-05-17 ENCOUNTER — Ambulatory Visit: Payer: Medicare Other | Admitting: Radiation Oncology

## 2018-05-17 MED ORDER — ENZALUTAMIDE 40 MG PO CAPS: 160.0000 mg | ORAL_CAPSULE | Freq: Every day | ORAL | 2 refills | Status: DC

## 2018-05-17 NOTE — Addendum Note (Signed)
Addended by: Darl Pikes on: 05/17/2018 03:46 PM   Modules accepted: Orders

## 2018-05-23 ENCOUNTER — Telehealth: Payer: Self-pay | Admitting: Pharmacy Technician

## 2018-05-23 DIAGNOSIS — C61 Malignant neoplasm of prostate: Secondary | ICD-10-CM | POA: Diagnosis not present

## 2018-05-23 DIAGNOSIS — S32029A Unspecified fracture of second lumbar vertebra, initial encounter for closed fracture: Secondary | ICD-10-CM | POA: Diagnosis not present

## 2018-05-23 DIAGNOSIS — M8458XD Pathological fracture in neoplastic disease, other specified site, subsequent encounter for fracture with routine healing: Secondary | ICD-10-CM | POA: Diagnosis not present

## 2018-05-23 DIAGNOSIS — S22089A Unspecified fracture of T11-T12 vertebra, initial encounter for closed fracture: Secondary | ICD-10-CM | POA: Diagnosis not present

## 2018-05-23 DIAGNOSIS — S32039A Unspecified fracture of third lumbar vertebra, initial encounter for closed fracture: Secondary | ICD-10-CM | POA: Diagnosis not present

## 2018-05-23 NOTE — Telephone Encounter (Signed)
Oral Oncology Patient Advocate Encounter  Patient will not be starting on Lynparza. He will take Xtandi instead.

## 2018-05-23 NOTE — Telephone Encounter (Addendum)
Oral Oncology Patient Advocate Encounter   Was successful in securing patient a $4250 grant from [place] to provide copayment coverage for Xtandi.  This will keep the out of pocket expense at $0.    The billing information is as follows:   Member ID: [id number] Group ID: [id number] RxBin: [id number] PCN: [id number] Eligibility Date: 05/23/18-05/24/19  William Wright Patient [place] Phone [phone] Fax [phone] 05/23/2018 9:15 AM

## 2018-06-01 IMAGING — DX DG CHEST 1V PORT
1 series · 1 of 1 positions shown · non-contrast
Comparison: 07/25/2016

CLINICAL DATA: Followup right lung biopsy.

EXAM:
PORTABLE CHEST 1 VIEW

[chest ap]
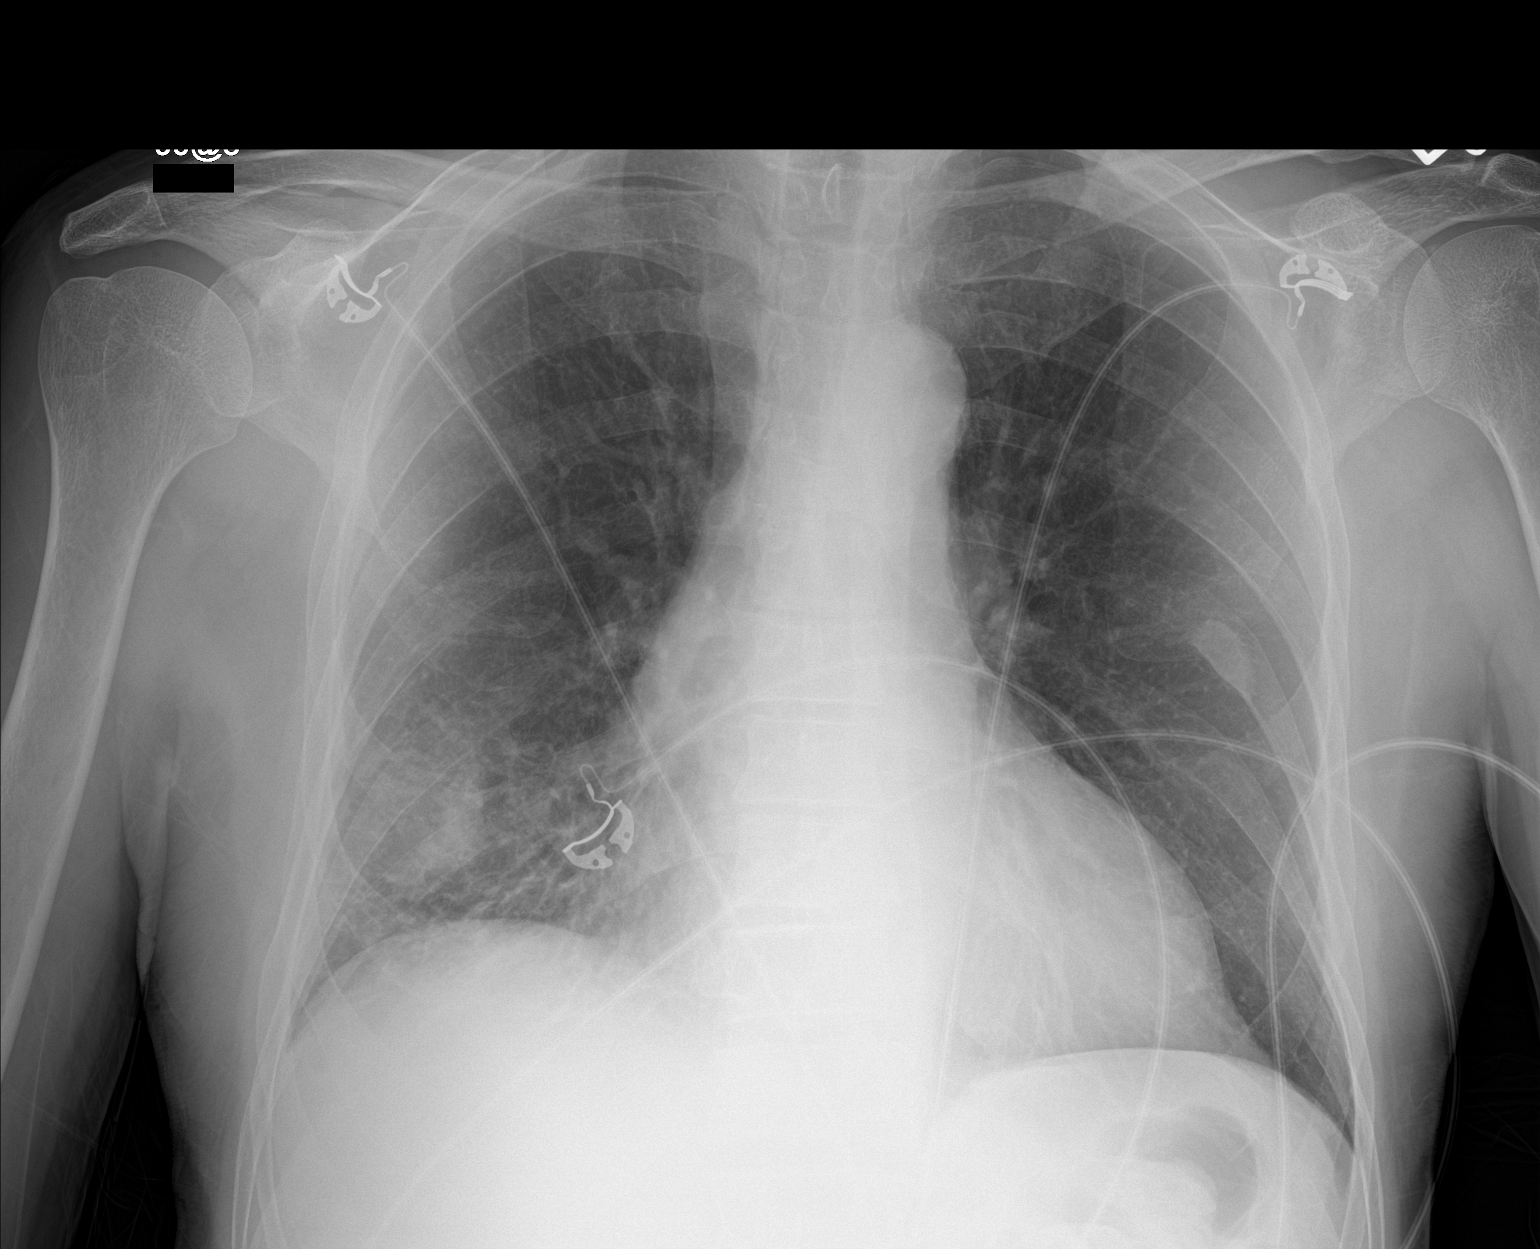

[1 of 1 positions shown; findings below may reference images not displayed]

FINDINGS: Heart size is normal. Mediastinal shadows are normal except for mild
aortic atherosclerosis. The left chest is clear with chronic
sclerosis the seventh rib. On the right, there is abnormal density
in the right lower lung related to the known mass lesion, probably
with a small amount of postprocedural bleeding. No pneumothorax. No
hemothorax.
IMPRESSION: No pneumothorax or hemothorax post right lung biopsy. Density in the
right lower chest related to the known mass lesion as well as a
probable small amount of postprocedural bleeding.

## 2018-06-03 ENCOUNTER — Inpatient Hospital Stay: Payer: Medicare Other | Attending: Internal Medicine

## 2018-06-03 ENCOUNTER — Other Ambulatory Visit: Payer: Medicare Other

## 2018-06-03 ENCOUNTER — Inpatient Hospital Stay (HOSPITAL_BASED_OUTPATIENT_CLINIC_OR_DEPARTMENT_OTHER): Payer: Medicare Other | Admitting: Internal Medicine

## 2018-06-03 ENCOUNTER — Encounter: Payer: Self-pay | Admitting: Internal Medicine

## 2018-06-03 ENCOUNTER — Inpatient Hospital Stay: Payer: Medicare Other

## 2018-06-03 VITALS — BP 122/78 | HR 67 | Resp 16

## 2018-06-03 DIAGNOSIS — Z79818 Long term (current) use of other agents affecting estrogen receptors and estrogen levels: Secondary | ICD-10-CM | POA: Insufficient documentation

## 2018-06-03 DIAGNOSIS — Z79899 Other long term (current) drug therapy: Secondary | ICD-10-CM | POA: Insufficient documentation

## 2018-06-03 DIAGNOSIS — C61 Malignant neoplasm of prostate: Secondary | ICD-10-CM | POA: Diagnosis not present

## 2018-06-03 DIAGNOSIS — M545 Low back pain: Secondary | ICD-10-CM | POA: Insufficient documentation

## 2018-06-03 DIAGNOSIS — Z923 Personal history of irradiation: Secondary | ICD-10-CM | POA: Diagnosis not present

## 2018-06-03 DIAGNOSIS — C7951 Secondary malignant neoplasm of bone: Secondary | ICD-10-CM | POA: Diagnosis not present

## 2018-06-03 LAB — CBC WITH DIFFERENTIAL/PLATELET
ABS IMMATURE GRANULOCYTES: 0.02 10*3/uL (ref 0.00–0.07)
BASOS ABS: 0 10*3/uL (ref 0.0–0.1)
Basophils Relative: 0 %
Eosinophils Absolute: 0.4 10*3/uL (ref 0.0–0.5)
Eosinophils Relative: 7 %
HCT: 36.5 % — ABNORMAL LOW (ref 39.0–52.0)
HEMOGLOBIN: 12.1 g/dL — AB (ref 13.0–17.0)
Immature Granulocytes: 0 %
LYMPHS PCT: 16 %
Lymphs Abs: 0.8 10*3/uL (ref 0.7–4.0)
MCH: 33.2 pg (ref 26.0–34.0)
MCHC: 33.2 g/dL (ref 30.0–36.0)
MCV: 100.3 fL — ABNORMAL HIGH (ref 80.0–100.0)
MONO ABS: 0.6 10*3/uL (ref 0.1–1.0)
Monocytes Relative: 11 %
NEUTROS ABS: 3.4 10*3/uL (ref 1.7–7.7)
Neutrophils Relative %: 66 %
Platelets: 214 10*3/uL (ref 150–400)
RBC: 3.64 MIL/uL — AB (ref 4.22–5.81)
RDW: 13.4 % (ref 11.5–15.5)
WBC: 5.2 10*3/uL (ref 4.0–10.5)
nRBC: 0 % (ref 0.0–0.2)

## 2018-06-03 LAB — COMPREHENSIVE METABOLIC PANEL
ALBUMIN: 3.9 g/dL (ref 3.5–5.0)
ALT: 9 U/L (ref 0–44)
AST: 15 U/L (ref 15–41)
Alkaline Phosphatase: 58 U/L (ref 38–126)
Anion gap: 7 (ref 5–15)
BUN: 19 mg/dL (ref 8–23)
CHLORIDE: 107 mmol/L (ref 98–111)
CO2: 25 mmol/L (ref 22–32)
Calcium: 9.2 mg/dL (ref 8.9–10.3)
Creatinine, Ser: 0.91 mg/dL (ref 0.61–1.24)
GFR calc Af Amer: >60 mL/min (ref >60)
GFR calc non Af Amer: >60 mL/min (ref >60)
Glucose, Bld: 105 mg/dL — ABNORMAL HIGH (ref 70–99)
POTASSIUM: 4.2 mmol/L (ref 3.5–5.1)
SODIUM: 139 mmol/L (ref 135–145)
Total Bilirubin: 0.5 mg/dL (ref 0.3–1.2)
Total Protein: 6.8 g/dL (ref 6.5–8.1)

## 2018-06-03 MED ORDER — DENOSUMAB 120 MG/1.7ML ~~LOC~~ SOLN
120.0000 mg | Freq: Once | SUBCUTANEOUS | Status: AC
  Administered 2018-06-03: 120 mg via SUBCUTANEOUS
  Filled 2018-06-03: qty 1.7

## 2018-06-03 NOTE — Assessment & Plan Note (Addendum)
#   Castrate resistant prostate cancer-stage IV; on Trelstar q 80M [03/09/2018]; currently on Xtandi.  July 24th 2019 CT scan chest and pelvis/bone scan-stable disease; however MRI July 2019 -L2 epidural extension ; L3 nerve root compression suggestive of progressive disease.  Status post radiation; PSA-6/improving.  Stable # continue X-tandi for now; plan MRI of spine- T/L in 4 weeks.   # Pain second malignancy-improved; off fentanyl patch/ norco/gabapentin. On tylenol prn.  Proceed with Xgeva today.s/p Neurosurgery valuation.   # DISPOSITION: # Treatment today # Follow up in -4 weeks; MD/ labs- cbc/cmp/x-geva/ MRI T-L spine prior- Dr.B

## 2018-06-03 NOTE — Progress Notes (Signed)
[facility] OFFICE PROGRESS NOTE  Patient Care Team: System, Provider Not In as PCP - General Royston Cowper, MD (Urology)  Cancer Staging Cancer of prostate [facility]) Staging form: Prostate, AJCC 7th Edition - Clinical: Stage IV (T2, M1) - Signed by Evlyn Kanner, NP on 01/07/2015    Oncology History   # FEB 2014- Prostate cancer Stage II [T2N0]; PA-18; [Dr.Wolfe]  # May 2015-STAGE IV;  PSA 90 on ADT; CT/Bone scan-extensive mets;  casodex+ Trelstar  # June 2016- Castration resistant prostate cancer/[facility] protocol- ZYTIGA plus minus XTANDI; June 28th-CT-C/A/P- Stable Retrocrural/RP/Pelvic LN;Bone scan- Stable sclerotic lesions.   # MARCH 2019- Taken off trial [ based on interm analysis]; continue X-tandi [given not needing steroids]  # July 2019- CT/bone scan stable; MRI lumbar spine- epidural extension/L3 spinal nerves involvement; on RT [finish sep 12th]  # vertebroplasty [[facility]] on sep 13th 2019   # RLL [~68m lung nodule] s/p Bx- Cryptococcus- ID/Dr.Fitzgerald- on diflucan   # Xgeva  # MOLECULAR TESTING/omniseq- BRCA-2 MUTATED; PDL-1- NEG; MSS; N-TRK-NEG  # GENETICS- BRCA-NEG; hetrozygous MUTHY; recommended family work up --------------------------------------------------    DIAGNOSIS: '[ ]'  Metastatic prostate cancer  STAGE: 4    ;GOALS: Palliative  CURRENT/MOST RECENT THERAPY-X-tandi+ Trelstar      [place] of [place] ([facility]   08/17/2012 Initial Diagnosis    Cancer of prostate, stage II    12/03/2013 Progression       10/13/2014 Progression        Prostate cancer metastatic to multiple sites ([facility]      INTERVAL HISTORY:  William VICKERS64y.o.  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  [facility]   Patient currently  continues to be on Xtandi.   Continues to have intermittent pain of his low back.  Radiates to his legs.  However it has not been as intense as it been in the past.  Tylenol as needed.  Is not on narcotics.  No nausea no vomiting.  [place]  Review of Systems  Constitutional: Negative for chills, diaphoresis, fever, malaise/fatigue and weight loss.  HENT: Negative for nosebleeds and sore throat.   Eyes: Negative for double vision.  Respiratory: Negative for cough, hemoptysis, sputum production, shortness of breath and wheezing.   Cardiovascular: Negative for chest pain, palpitations, orthopnea and leg swelling.  Gastrointestinal: Negative for abdominal pain, blood in stool, constipation, diarrhea, heartburn, melena, nausea and vomiting.  Genitourinary: Negative for dysuria, frequency and urgency.  Musculoskeletal: Positive for back pain and joint pain.  Skin: Negative.  Negative for itching and rash.  Neurological: Negative for dizziness, focal weakness, weakness and headaches.  Endo/Heme/Allergies: Does not bruise/bleed easily.  Psychiatric/Behavioral: Negative for depression. The patient is nervous/anxious. The patient does not have insomnia.       PAST MEDICAL HISTORY :  Past Medical History:  Diagnosis Date  . [facility] [place]  . Family history of leukemia   . [place]state cancer ([facility]     PAST SURGICAL HISTORY :   Past Surgical History:  Procedure Laterality Date  . EXTRACORPOREAL SHOCK WAVE LITHOTRIPSY Left 05/20/2015   Procedure: EXTRACORPOREAL SHOCK WAVE LITHOTRIPSY (ESWL);  Surgeon: MRoyston Cowper MD;  Location: [facility] ORS;  Service: Urology;  Laterality: Left;  . EXTRACORPOREAL SHOCK WAVE LITHOTRIPSY Right  12/21/2016   Procedure: EXTRACORPOREAL SHOCK WAVE LITHOTRIPSY (ESWL);  Surgeon: Royston Cowper, MD;  Location: [facility] ORS;  Service: Urology;  Laterality: Right;  . IR RADIOLOGIST EVAL & MGMT  02/28/2018     FAMILY HISTORY :   Family History  Problem Relation Age of Onset  . Alzheimer's disease Father   . Lung cancer Maternal Grandfather 4  . Leukemia Paternal Grandmother 75  . COPD Paternal Grandfather   . Cancer Maternal Uncle        type unk, dx 80's    SOCIAL HISTORY:   Social History   Tobacco Use  . Smoking status: Never Smoker  . Smokeless tobacco: Never Used  Substance Use Topics  . Alcohol use: No  . Drug use: No    ALLERGIES:  is allergic to no known allergies.  MEDICATIONS:  Current Outpatient Medications  Medication Sig Dispense Refill  . enzalutamide (XTANDI) 40 MG capsule Take 4 capsules (160 mg total) by mouth daily. 120 capsule 2  . gabapentin (NEURONTIN) 100 MG capsule Take 100 mg by mouth 3 (three) times daily.    Marland Kitchen HYDROcodone-acetaminophen (NORCO/VICODIN) 5-325 MG tablet Take 1 tablet by mouth every 6 (six) hours as needed for moderate pain.    Marland Kitchen oxybutynin (DITROPAN-XL) 5 MG 24 hr tablet Take 1 tablet (5 mg total) by mouth at bedtime. 90 tablet 3  . tamsulosin (FLOMAX) 0.4 MG CAPS capsule Take 1 capsule (0.4 mg total) by mouth daily. 30 capsule 11  . traMADol (ULTRAM) 50 MG tablet Take 1 tablet (50 mg total) by mouth every 8 (eight) hours as needed. 45 tablet 1  . Triptorelin Pamoate (TRELSTAR) 22.5 MG injection Inject 22.5 mg into the muscle every 6 (six) months.    . valACYclovir (VALTREX) 1000 MG tablet Take 1 tablet (1,000 mg total) by mouth 2 (two) times daily. 60 tablet 6  . ondansetron (ZOFRAN) 8 MG tablet Take 1 tablet (8 mg total) by mouth every 8 (eight) hours as needed for nausea or vomiting. (Patient not taking: Reported on 05/06/2018) 20 tablet 0  . promethazine (PHENERGAN) 25 MG tablet Take 1 tablet (25 mg total) by mouth every 6 (six) hours as needed for nausea or vomiting. (Patient not taking: Reported on 05/06/2018) 30 tablet 0   No current facility-administered medications for this visit.     PHYSICAL EXAMINATION: ECOG PERFORMANCE  STATUS: 1 - Symptomatic but completely ambulatory  BP 122/78 (BP Location: Left Arm, Patient Position: Sitting)   Pulse 67   Resp 16   There were no vitals filed for this visit.  Physical Exam  Constitutional: He is oriented to person, place, and time and well-developed, well-nourished, and in no distress.  Alone.  Walking by himself.  HENT:  Head: Normocephalic and atraumatic.  Mouth/Throat: Oropharynx is clear and moist. No oropharyngeal exudate.  Eyes: Pupils are equal, round, and reactive to light.  Neck: Normal range of motion. Neck supple.  Cardiovascular: Normal rate and regular rhythm.  Pulmonary/Chest: No respiratory distress. He has no wheezes.  Abdominal: Soft. Bowel sounds are normal. He exhibits no distension and no mass. There is no tenderness. There is no rebound and no guarding.  Musculoskeletal: Normal range of motion. He exhibits no edema or tenderness.  Neurological: He is alert and oriented to person, place, and time.  Skin: Skin is warm.  Psychiatric: Affect normal.       LABORATORY DATA:  I have reviewed the data as listed    Component Value Date/Time  NA 139 06/03/2018 0932   NA 137 11/12/2014 1415   K 4.2 06/03/2018 0932   K 4.1 11/12/2014 1415   CL 107 06/03/2018 0932   CL 105 11/12/2014 1415   CO2 25 06/03/2018 0932   CO2 24 11/12/2014 1415   GLUCOSE 105 (H) 06/03/2018 0932   GLUCOSE 98 11/12/2014 1415   BUN 19 06/03/2018 0932   BUN 24 (H) 11/12/2014 1415   CREATININE 0.91 06/03/2018 0932   CREATININE 1.02 11/12/2014 1415   CALCIUM 9.2 06/03/2018 0932   CALCIUM 9.2 11/12/2014 1415   PROT 6.8 06/03/2018 0932   PROT 7.4 11/12/2014 1415   ALBUMIN 3.9 06/03/2018 0932   ALBUMIN 4.4 11/12/2014 1415   AST 15 06/03/2018 0932   AST 23 11/12/2014 1415   ALT 9 06/03/2018 0932   ALT 16 (L) 11/12/2014 1415   ALKPHOS 58 06/03/2018 0932   ALKPHOS 103 11/12/2014 1415   BILITOT 0.5 06/03/2018 0932   BILITOT 0.4 11/12/2014 1415   GFRNONAA >60  06/03/2018 0932   GFRNONAA >60 11/12/2014 1415   GFRAA >60 06/03/2018 0932   GFRAA >60 11/12/2014 1415    No results found for: SPEP, UPEP  Lab Results  Component Value Date   WBC 5.2 06/03/2018   NEUTROABS 3.4 06/03/2018   HGB 12.1 (L) 06/03/2018   HCT 36.5 (L) 06/03/2018   MCV 100.3 (H) 06/03/2018   PLT 214 06/03/2018      Chemistry      Component Value Date/Time   NA 139 06/03/2018 0932   NA 137 11/12/2014 1415   K 4.2 06/03/2018 0932   K 4.1 11/12/2014 1415   CL 107 06/03/2018 0932   CL 105 11/12/2014 1415   CO2 25 06/03/2018 0932   CO2 24 11/12/2014 1415   BUN 19 06/03/2018 0932   BUN 24 (H) 11/12/2014 1415   CREATININE 0.91 06/03/2018 0932   CREATININE 1.02 11/12/2014 1415      Component Value Date/Time   CALCIUM 9.2 06/03/2018 0932   CALCIUM 9.2 11/12/2014 1415   ALKPHOS 58 06/03/2018 0932   ALKPHOS 103 11/12/2014 1415   AST 15 06/03/2018 0932   AST 23 11/12/2014 1415   ALT 9 06/03/2018 0932   ALT 16 (L) 11/12/2014 1415   BILITOT 0.5 06/03/2018 0932   BILITOT 0.4 11/12/2014 1415       RADIOGRAPHIC STUDIES: I have personally reviewed the radiological images as listed and agreed with the findings in the report. No results found.   ASSESSMENT & PLAN:  Prostate cancer metastatic to multiple sites [facility]) # Castrate resistant prostate cancer-stage IV; on Trelstar q 34M [03/09/2018]; currently on Xtandi.  July 24th 2019 CT scan chest and pelvis/bone scan-stable disease; however MRI July 2019 -L2 epidural extension ; L3 nerve root compression suggestive of progressive disease.  Status post radiation; PSA-6/improving.  Stable # continue X-tandi for now; plan MRI of spine- T/L in 4 weeks.   # Pain second malignancy-improved; off fentanyl patch/ norco/gabapentin. On tylenol prn.  Proceed with Xgeva today.s/p Neurosurgery valuation.   # DISPOSITION: # Treatment today # Follow up in -4 weeks; MD/ labs- cbc/cmp/x-geva/ MRI T-L spine prior- Dr.B    Orders  Placed This Encounter  Procedures  . MR Thoracic Spine W Wo Contrast    Standing Status:   Future    Standing Expiration Date:   06/03/2019    Order Specific Question:   ** REASON FOR EXAM (FREE TEXT)    Answer:   mets to bone/  back pain    Order Specific Question:   GRA to provide read?    Answer:   Yes    Order Specific Question:   If indicated for the ordered procedure, I authorize the administration of contrast media per Radiology protocol    Answer:   Yes    Order Specific Question:   What is the patient's sedation requirement?    Answer:   No Sedation    Order Specific Question:   Use SRS Protocol?    Answer:   No    Order Specific Question:   Does the patient have a pacemaker or implanted devices?    Answer:   No    Order Specific Question:   Preferred imaging location?    Answer:   [facility] (table limit-300lbs)    Order Specific Question:   Radiology Contrast Protocol - do NOT remove file path    Answer:   \\[email or web address]  . MR Lumbar Spine W Wo Contrast    Standing Status:   Future    Standing Expiration Date:   06/03/2019    Order Specific Question:   ** REASON FOR EXAM (FREE TEXT)    Answer:   mest to bone/ back pain    Order Specific Question:   If indicated for the ordered procedure, I authorize the administration of contrast media per Radiology protocol    Answer:   Yes    Order Specific Question:   What is the patient's sedation requirement?    Answer:   No Sedation    Order Specific Question:   Does the patient have a pacemaker or implanted devices?    Answer:   No    Order Specific Question:   Use SRS Protocol?    Answer:   No    Order Specific Question:   Radiology Contrast Protocol - do NOT remove file path    Answer:   [email or web address]    Order Specific Question:   Preferred imaging location?    Answer:   [facility] (table limit-300lbs)   All questions were answered. The patient knows  to call the clinic with any problems, questions or concerns.      Cammie Sickle, MD 06/03/2018 12:58 PM

## 2018-06-24 ENCOUNTER — Ambulatory Visit
Admission: RE | Admit: 2018-06-24 | Discharge: 2018-06-24 | Disposition: A | Discharge status: Home / Self Care | Payer: Medicare Other | Source: Ambulatory Visit | Attending: Internal Medicine | Admitting: Internal Medicine

## 2018-06-24 DIAGNOSIS — M546 Pain in thoracic spine: Secondary | ICD-10-CM | POA: Diagnosis not present

## 2018-06-24 DIAGNOSIS — Z9889 Other specified postprocedural states: Secondary | ICD-10-CM | POA: Insufficient documentation

## 2018-06-24 DIAGNOSIS — D497 Neoplasm of unspecified behavior of endocrine glands and other parts of nervous system: Secondary | ICD-10-CM | POA: Diagnosis not present

## 2018-06-24 DIAGNOSIS — M5126 Other intervertebral disc displacement, lumbar region: Secondary | ICD-10-CM | POA: Diagnosis not present

## 2018-06-24 DIAGNOSIS — C61 Malignant neoplasm of prostate: Secondary | ICD-10-CM

## 2018-06-24 MED ORDER — GADOBUTROL 1 MMOL/ML IV SOLN
7.0000 mL | Freq: Once | INTRAVENOUS | Status: AC | PRN
  Administered 2018-06-24: 7 mL via INTRAVENOUS

## 2018-07-01 ENCOUNTER — Other Ambulatory Visit: Payer: Self-pay | Admitting: *Deleted

## 2018-07-01 ENCOUNTER — Inpatient Hospital Stay (HOSPITAL_BASED_OUTPATIENT_CLINIC_OR_DEPARTMENT_OTHER): Payer: Medicare Other | Admitting: Internal Medicine

## 2018-07-01 ENCOUNTER — Inpatient Hospital Stay: Payer: Medicare Other | Attending: Internal Medicine

## 2018-07-01 ENCOUNTER — Other Ambulatory Visit: Payer: Self-pay

## 2018-07-01 ENCOUNTER — Inpatient Hospital Stay: Payer: Medicare Other

## 2018-07-01 ENCOUNTER — Encounter: Payer: Self-pay | Admitting: Internal Medicine

## 2018-07-01 ENCOUNTER — Telehealth: Payer: Self-pay | Admitting: Internal Medicine

## 2018-07-01 VITALS — BP 125/76 | HR 59 | Temp 98.3°F | Resp 20 | Ht 72.0 in | Wt 158.0 lb

## 2018-07-01 DIAGNOSIS — C61 Malignant neoplasm of prostate: Secondary | ICD-10-CM | POA: Diagnosis not present

## 2018-07-01 DIAGNOSIS — Z79818 Long term (current) use of other agents affecting estrogen receptors and estrogen levels: Secondary | ICD-10-CM | POA: Diagnosis not present

## 2018-07-01 DIAGNOSIS — C7951 Secondary malignant neoplasm of bone: Secondary | ICD-10-CM | POA: Insufficient documentation

## 2018-07-01 DIAGNOSIS — Z79899 Other long term (current) drug therapy: Secondary | ICD-10-CM | POA: Diagnosis not present

## 2018-07-01 DIAGNOSIS — Z923 Personal history of irradiation: Secondary | ICD-10-CM

## 2018-07-01 DIAGNOSIS — R5381 Other malaise: Secondary | ICD-10-CM

## 2018-07-01 DIAGNOSIS — M545 Low back pain: Secondary | ICD-10-CM | POA: Diagnosis not present

## 2018-07-01 DIAGNOSIS — R5383 Other fatigue: Secondary | ICD-10-CM | POA: Insufficient documentation

## 2018-07-01 DIAGNOSIS — Z806 Family history of leukemia: Secondary | ICD-10-CM | POA: Diagnosis not present

## 2018-07-01 LAB — CBC WITH DIFFERENTIAL/PLATELET
Abs Immature Granulocytes: 0.02 10*3/uL (ref 0.00–0.07)
Basophils Absolute: 0 10*3/uL (ref 0.0–0.1)
Basophils Relative: 1 %
Eosinophils Absolute: 0.5 10*3/uL (ref 0.0–0.5)
Eosinophils Relative: 10 %
HCT: 35.9 % — ABNORMAL LOW (ref 39.0–52.0)
Hemoglobin: 11.4 g/dL — ABNORMAL LOW (ref 13.0–17.0)
Immature Granulocytes: 0 %
Lymphocytes Relative: 19 %
Lymphs Abs: 0.9 10*3/uL (ref 0.7–4.0)
MCH: 32.3 pg (ref 26.0–34.0)
MCHC: 31.8 g/dL (ref 30.0–36.0)
MCV: 101.7 fL — ABNORMAL HIGH (ref 80.0–100.0)
Monocytes Absolute: 0.6 10*3/uL (ref 0.1–1.0)
Monocytes Relative: 13 %
Neutro Abs: 2.7 10*3/uL (ref 1.7–7.7)
Neutrophils Relative %: 57 %
Platelets: 186 10*3/uL (ref 150–400)
RBC: 3.53 MIL/uL — ABNORMAL LOW (ref 4.22–5.81)
RDW: 13.3 % (ref 11.5–15.5)
WBC: 4.6 10*3/uL (ref 4.0–10.5)
nRBC: 0 % (ref 0.0–0.2)

## 2018-07-01 LAB — COMPREHENSIVE METABOLIC PANEL
ALBUMIN: 3.8 g/dL (ref 3.5–5.0)
ALT: 8 U/L (ref 0–44)
AST: 16 U/L (ref 15–41)
Alkaline Phosphatase: 47 U/L (ref 38–126)
Anion gap: 7 (ref 5–15)
BUN: 23 mg/dL (ref 8–23)
CO2: 25 mmol/L (ref 22–32)
Calcium: 8.9 mg/dL (ref 8.9–10.3)
Chloride: 106 mmol/L (ref 98–111)
Creatinine, Ser: 0.84 mg/dL (ref 0.61–1.24)
GFR calc Af Amer: >60 mL/min (ref >60)
GFR calc non Af Amer: >60 mL/min (ref >60)
GLUCOSE: 102 mg/dL — AB (ref 70–99)
Potassium: 4 mmol/L (ref 3.5–5.1)
SODIUM: 138 mmol/L (ref 135–145)
Total Bilirubin: 0.5 mg/dL (ref 0.3–1.2)
Total Protein: 6.5 g/dL (ref 6.5–8.1)

## 2018-07-01 LAB — PSA: PROSTATIC SPECIFIC ANTIGEN: 4.28 ng/mL — AB (ref 0.00–4.00)

## 2018-07-01 MED ORDER — DENOSUMAB 120 MG/1.7ML ~~LOC~~ SOLN
120.0000 mg | Freq: Once | SUBCUTANEOUS | Status: AC
  Administered 2018-07-01: 120 mg via SUBCUTANEOUS
  Filled 2018-07-01: qty 1.7

## 2018-07-01 NOTE — Telephone Encounter (Signed)
Mychart message sent.

## 2018-07-01 NOTE — Assessment & Plan Note (Addendum)
#   Castrate resistant prostate cancer-stage IV; on Trelstar q 22M [03/09/2018]; Currently on Xtandi- MRI dec 2019 -T12 kyphoplasty stable L2-3 epidural-stable status post radiation; December 2019 PSA 4/improving.  #Overall clinically stable.  Continue Xtandi at this time.  # Pain second malignancy-stable.  On ibuprofenl prn.  Proceed with Xgeva today; see MRI above.  Recommend follow-up with [facility] neurosurgery if any other options available for the patient.  # DISPOSITION: # Treatment today; lab-psa today.  # Follow up in 4 weeks; MD/ labs- cbc/cmp/PSA/x-geva  Addendum:  PSA 4.2 weight improving.  Patient will be informed.  Cc; Dr.Yarbrough.

## 2018-07-01 NOTE — Progress Notes (Signed)
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   # MARCH 2019- Taken off trial [ based on interm analysis]; continue X-tandi [given not needing steroids]  # July 2019- CT/bone scan stable; MRI lumbar spine- epidural extension/L3 spinal nerves involvement; on RT [finish sep 12th]  # vertebroplasty [[facility]] on sep 13th 2019   # RLL [~5m lung nodule] s/p Bx- Cryptococcus- ID/Dr.Fitzgerald- on diflucan   # Xgeva  # MOLECULAR TESTING/omniseq- BRCA-2 MUTATED; PDL-1- NEG; MSS; N-TRK-NEG  # GENETICS- BRCA-NEG; hetrozygous MUTHY; recommended family work up --------------------------------------------------    DIAGNOSIS: '[ ]'  Metastatic prostate cancer  STAGE: 4    ;GOALS: Palliative  CURRENT/MOST RECENT THERAPY-X-tandi+ Trelstar      Cancer of prostate ([facility]   08/17/2012 Initial Diagnosis    Cancer of prostate, stage II    12/03/2013 Progression       10/13/2014 Progression        Prostate cancer metastatic to multiple sites ([facility]      INTERVAL HISTORY:  William ARONS89y.o.  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  Patient currently continues been [place]  He continues to complain of intermittent low back  pain rates about 2-4 on a scale of 10.  He is not on narcotic pain medication.  He takes NSAIDs Tylenol/ibuprofen as needed.  He continues pain Xtandi.    Review of Systems  Constitutional: Positive for malaise/fatigue. Negative for chills, diaphoresis and fever.  HENT: Negative for nosebleeds and sore throat.   Eyes: Negative for double vision.  Respiratory: Negative for cough, hemoptysis, sputum production, shortness of breath and wheezing.   Cardiovascular: Negative for chest pain, palpitations, orthopnea and leg swelling.  Gastrointestinal: Negative for abdominal pain, blood in stool, constipation, diarrhea, heartburn, melena, nausea and vomiting.  Genitourinary: Negative for dysuria, frequency and urgency.  Musculoskeletal: Positive for back pain and joint pain.  Skin: Negative.  Negative for itching and rash.  Neurological: Negative for dizziness, focal weakness, weakness and headaches.  Endo/Heme/Allergies: Does not bruise/bleed easily.  Psychiatric/Behavioral: Negative for depression. The patient is nervous/anxious. The patient does not have insomnia.       PAST MEDICAL HISTORY :  Past Medical History:  Diagnosis Date  . Cancer of prostate ([facility] 07/2012  . Family history of leukemia   . Prostate cancer ([facility]     PAST SURGICAL HISTORY :   Past Surgical History:  Procedure Laterality Date  . EXTRACORPOREAL SHOCK WAVE LITHOTRIPSY Left 05/20/2015   Procedure: EXTRACORPOREAL SHOCK WAVE LITHOTRIPSY (ESWL);  Surgeon: MRoyston Cowper MD;  Location: [facility] ORS;  Service: Urology;  Laterality: Left;  . EXTRACORPOREAL SHOCK WAVE LITHOTRIPSY Right 12/21/2016   Procedure: EXTRACORPOREAL SHOCK WAVE LITHOTRIPSY (ESWL);  Surgeon: WRoyston Cowper MD;  Location: [facility] ORS;  Service: Urology;  Laterality: Right;  . IR  RADIOLOGIST EVAL & MGMT  02/28/2018    FAMILY HISTORY :   Family History  Problem Relation Age of Onset  . Alzheimer's disease Father   . Lung cancer Maternal Grandfather 52   . Leukemia Paternal Grandmother 77  . COPD Paternal Grandfather   . Cancer Maternal Uncle        type unk, dx 80's    SOCIAL HISTORY:   Social History   Tobacco Use  . Smoking status: Never Smoker  . Smokeless tobacco: Never Used  Substance Use Topics  . Alcohol use: No  . Drug use: No    ALLERGIES:  is allergic to no known allergies.  MEDICATIONS:  Current Outpatient Medications  Medication Sig Dispense Refill  . enzalutamide (XTANDI) 40 MG capsule Take 4 capsules (160 mg total) by mouth daily. 120 capsule 2  . gabapentin (NEURONTIN) 100 MG capsule Take 100 mg by mouth 3 (three) times daily.    Marland Kitchen oxybutynin (DITROPAN-XL) 5 MG 24 hr tablet Take 1 tablet (5 mg total) by mouth at bedtime. 90 tablet 3  . tamsulosin (FLOMAX) 0.4 MG CAPS capsule Take 1 capsule (0.4 mg total) by mouth daily. 30 capsule 11  . Triptorelin Pamoate (TRELSTAR) 22.5 MG injection Inject 22.5 mg into the muscle every 6 (six) months.    Marland Kitchen HYDROcodone-acetaminophen (NORCO/VICODIN) 5-325 MG tablet Take 1 tablet by mouth every 6 (six) hours as needed for moderate pain.    Marland Kitchen ondansetron (ZOFRAN) 8 MG tablet Take 1 tablet (8 mg total) by mouth every 8 (eight) hours as needed for nausea or vomiting. (Patient not taking: Reported on 05/06/2018) 20 tablet 0  . promethazine (PHENERGAN) 25 MG tablet Take 1 tablet (25 mg total) by mouth every 6 (six) hours as needed for nausea or vomiting. (Patient not taking: Reported on 05/06/2018) 30 tablet 0  . traMADol (ULTRAM) 50 MG tablet Take 1 tablet (50 mg total) by mouth every 8 (eight) hours as needed. (Patient not taking: Reported on 07/01/2018) 45 tablet 1  . valACYclovir (VALTREX) 1000 MG tablet Take 1 tablet (1,000 mg total) by mouth 2 (two) times daily. (Patient not taking: Reported on 07/01/2018) 60 tablet 6   No current facility-administered medications for this visit.     PHYSICAL EXAMINATION: ECOG PERFORMANCE STATUS: 1 - Symptomatic but completely ambulatory  BP  125/76 (BP Location: Left Arm, Patient Position: Sitting)   Pulse (!) 59   Temp 98.3 F (36.8 C) (Oral)   Resp 20   Ht 6' (1.829 m)   Wt 158 lb (71.7 kg)   BMI 21.43 kg/m   Filed Weights   07/01/18 0953  Weight: 158 lb (71.7 kg)    Physical Exam  Constitutional: He is oriented to person, place, and time and well-developed, well-nourished, and in no distress.  Alone.  Walking by himself.  HENT:  Head: Normocephalic and atraumatic.  Mouth/Throat: Oropharynx is clear and moist. No oropharyngeal exudate.  Eyes: Pupils are equal, round, and reactive to light.  Neck: Normal range of motion. Neck supple.  Cardiovascular: Normal rate and regular rhythm.  Pulmonary/Chest: No respiratory distress. He has no wheezes.  Abdominal: Soft. Bowel sounds are normal. He exhibits no distension and no mass. There is no abdominal tenderness. There is no rebound and no guarding.  Musculoskeletal: Normal range of motion.        General: No tenderness or edema.  Neurological: He is alert and oriented to person, place, and time.  Skin: Skin is warm.  Psychiatric: Affect  normal.       LABORATORY DATA:  I have reviewed the data as listed    Component Value Date/Time   NA 138 07/01/2018 0920   NA 137 11/12/2014 1415   K 4.0 07/01/2018 0920   K 4.1 11/12/2014 1415   CL 106 07/01/2018 0920   CL 105 11/12/2014 1415   CO2 25 07/01/2018 0920   CO2 24 11/12/2014 1415   GLUCOSE 102 (H) 07/01/2018 0920   GLUCOSE 98 11/12/2014 1415   BUN 23 07/01/2018 0920   BUN 24 (H) 11/12/2014 1415   CREATININE 0.84 07/01/2018 0920   CREATININE 1.02 11/12/2014 1415   CALCIUM 8.9 07/01/2018 0920   CALCIUM 9.2 11/12/2014 1415   PROT 6.5 07/01/2018 0920   PROT 7.4 11/12/2014 1415   ALBUMIN 3.8 07/01/2018 0920   ALBUMIN 4.4 11/12/2014 1415   AST 16 07/01/2018 0920   AST 23 11/12/2014 1415   ALT 8 07/01/2018 0920   ALT 16 (L) 11/12/2014 1415   ALKPHOS 47 07/01/2018 0920   ALKPHOS 103 11/12/2014 1415    BILITOT 0.5 07/01/2018 0920   BILITOT 0.4 11/12/2014 1415   GFRNONAA >60 07/01/2018 0920   GFRNONAA >60 11/12/2014 1415   GFRAA >60 07/01/2018 0920   GFRAA >60 11/12/2014 1415    No results found for: SPEP, UPEP  Lab Results  Component Value Date   WBC 4.6 07/01/2018   NEUTROABS 2.7 07/01/2018   HGB 11.4 (L) 07/01/2018   HCT 35.9 (L) 07/01/2018   MCV 101.7 (H) 07/01/2018   PLT 186 07/01/2018      Chemistry      Component Value Date/Time   NA 138 07/01/2018 0920   NA 137 11/12/2014 1415   K 4.0 07/01/2018 0920   K 4.1 11/12/2014 1415   CL 106 07/01/2018 0920   CL 105 11/12/2014 1415   CO2 25 07/01/2018 0920   CO2 24 11/12/2014 1415   BUN 23 07/01/2018 0920   BUN 24 (H) 11/12/2014 1415   CREATININE 0.84 07/01/2018 0920   CREATININE 1.02 11/12/2014 1415      Component Value Date/Time   CALCIUM 8.9 07/01/2018 0920   CALCIUM 9.2 11/12/2014 1415   ALKPHOS 47 07/01/2018 0920   ALKPHOS 103 11/12/2014 1415   AST 16 07/01/2018 0920   AST 23 11/12/2014 1415   ALT 8 07/01/2018 0920   ALT 16 (L) 11/12/2014 1415   BILITOT 0.5 07/01/2018 0920   BILITOT 0.4 11/12/2014 1415       RADIOGRAPHIC STUDIES: I have personally reviewed the radiological images as listed and agreed with the findings in the report. No results found.   ASSESSMENT & PLAN:  Prostate cancer metastatic to multiple sites [facility]) # Castrate resistant prostate cancer-stage IV; on Trelstar q 88M [03/09/2018]; Currently on Xtandi- MRI dec 2019 -T12 kyphoplasty stable L2-3 epidural-stable status post radiation; December 2019 PSA 4/improving.  #Overall clinically stable.  Continue Xtandi at this time.  # Pain second malignancy-stable.  On ibuprofenl prn.  Proceed with Xgeva today; see MRI above.  Recommend follow-up with [facility] neurosurgery if any other options available for the patient.  # DISPOSITION: # Treatment today; lab-psa today.  # Follow up in 4 weeks; MD/ labs- cbc/cmp/PSA/x-geva  Addendum:  PSA 4.2  weight improving.  Patient will be informed.  Cc; Dr.Yarbrough.    Orders Placed This Encounter  Procedures  . PSA    Standing Status:   Standing    Number of Occurrences:   12  Standing Expiration Date:   07/02/2019  . CBC with Differential/Platelet    Standing Status:   Standing    Number of Occurrences:   12    Standing Expiration Date:   07/02/2019  . Comprehensive metabolic panel    Standing Status:   Standing    Number of Occurrences:   12    Standing Expiration Date:   12/31/2019  . PSA    Standing Status:   Future    Number of Occurrences:   1    Standing Expiration Date:   07/02/2019   All questions were answered. The patient knows to call the clinic with any problems, questions or concerns.      Cammie Sickle, MD 07/01/2018 8:07 PM

## 2018-07-18 ENCOUNTER — Telehealth: Payer: Self-pay | Admitting: Pharmacist

## 2018-07-18 DIAGNOSIS — C61 Malignant neoplasm of prostate: Secondary | ICD-10-CM | POA: Diagnosis not present

## 2018-07-18 DIAGNOSIS — M8458XD Pathological fracture in neoplastic disease, other specified site, subsequent encounter for fracture with routine healing: Secondary | ICD-10-CM | POA: Diagnosis not present

## 2018-07-18 MED ORDER — ENZALUTAMIDE 40 MG PO CAPS: 160.0000 mg | ORAL_CAPSULE | Freq: Every day | ORAL | 2 refills | Status: DC

## 2018-07-18 MED FILL — XTANDI 40 MG CAPSULE: 40 | 30 days supply | Qty: 120 | Fill #0

## 2018-07-18 NOTE — Telephone Encounter (Signed)
Oral Chemotherapy Pharmacist Encounter    Spoke with William Wright to review dosing, administration, and side effects for Xtandi (enzalutamide). Patient reported no new side effects or concerns since they were last seen in the office.   Darl Pikes, PharmD, BCPS, [facility] Hematology/Oncology Clinical Pharmacist [facility]/AP Oral [facility] [phone]  07/18/2018 2:12 PM

## 2018-07-18 NOTE — Telephone Encounter (Signed)
Oral Oncology Pharmacist Encounter   Patient is switching from manufacturer assistance to filling at [facility]. Refill prescription for Xtandi sent to [facility] for the treatment of metastatic prostate cancer, planned duration until disease progression or unacceptable drug toxicity. Medication started 12/2014.  Prescription dose and frequency assessed.    Current medication list in Epic reviewed, several DDIs with Gillermina Phy identified: - Xtandi may may decrease the serum concentration of hydrocodone-acetaminophen, ondansetron, tramadol. Monitor patient for decreased effectiveness of the listed medications. No baseline dose adjustment needed. These are not new DDIs for Mr. Hoogendoorn.   Oral Oncology Clinic will continue to follow patient.   Darl Pikes, PharmD, BCPS, [facility] Hematology/Oncology Clinical Pharmacist [facility]/AP Oral [facility] [phone]  07/18/2018 12:11 PM

## 2018-07-29 ENCOUNTER — Inpatient Hospital Stay: Payer: Medicare Other | Attending: Internal Medicine

## 2018-07-29 ENCOUNTER — Encounter: Payer: Self-pay | Admitting: Internal Medicine

## 2018-07-29 ENCOUNTER — Inpatient Hospital Stay: Payer: Medicare Other

## 2018-07-29 ENCOUNTER — Inpatient Hospital Stay (HOSPITAL_BASED_OUTPATIENT_CLINIC_OR_DEPARTMENT_OTHER): Payer: Medicare Other | Admitting: Internal Medicine

## 2018-07-29 VITALS — BP 143/75 | HR 57 | Temp 97.2°F | Resp 16 | Wt 159.6 lb

## 2018-07-29 DIAGNOSIS — R5383 Other fatigue: Secondary | ICD-10-CM

## 2018-07-29 DIAGNOSIS — Z79899 Other long term (current) drug therapy: Secondary | ICD-10-CM

## 2018-07-29 DIAGNOSIS — M549 Dorsalgia, unspecified: Secondary | ICD-10-CM

## 2018-07-29 DIAGNOSIS — C61 Malignant neoplasm of prostate: Secondary | ICD-10-CM | POA: Diagnosis not present

## 2018-07-29 DIAGNOSIS — Z79818 Long term (current) use of other agents affecting estrogen receptors and estrogen levels: Secondary | ICD-10-CM | POA: Diagnosis not present

## 2018-07-29 DIAGNOSIS — R5381 Other malaise: Secondary | ICD-10-CM | POA: Diagnosis not present

## 2018-07-29 DIAGNOSIS — R911 Solitary pulmonary nodule: Secondary | ICD-10-CM | POA: Diagnosis not present

## 2018-07-29 DIAGNOSIS — C7951 Secondary malignant neoplasm of bone: Secondary | ICD-10-CM | POA: Insufficient documentation

## 2018-07-29 DIAGNOSIS — Z923 Personal history of irradiation: Secondary | ICD-10-CM | POA: Diagnosis not present

## 2018-07-29 LAB — CBC WITH DIFFERENTIAL/PLATELET
Abs Immature Granulocytes: 0.01 10*3/uL (ref 0.00–0.07)
Basophils Absolute: 0 10*3/uL (ref 0.0–0.1)
Basophils Relative: 1 %
Eosinophils Absolute: 0.3 10*3/uL (ref 0.0–0.5)
Eosinophils Relative: 8 %
HCT: 37.7 % — ABNORMAL LOW (ref 39.0–52.0)
HEMOGLOBIN: 12.3 g/dL — AB (ref 13.0–17.0)
IMMATURE GRANULOCYTES: 0 %
LYMPHS PCT: 29 %
Lymphs Abs: 1.1 10*3/uL (ref 0.7–4.0)
MCH: 32.8 pg (ref 26.0–34.0)
MCHC: 32.6 g/dL (ref 30.0–36.0)
MCV: 100.5 fL — ABNORMAL HIGH (ref 80.0–100.0)
Monocytes Absolute: 0.5 10*3/uL (ref 0.1–1.0)
Monocytes Relative: 13 %
NEUTROS PCT: 49 %
NRBC: 0 % (ref 0.0–0.2)
Neutro Abs: 1.8 10*3/uL (ref 1.7–7.7)
Platelets: 200 10*3/uL (ref 150–400)
RBC: 3.75 MIL/uL — ABNORMAL LOW (ref 4.22–5.81)
RDW: 13.2 % (ref 11.5–15.5)
WBC: 3.7 10*3/uL — ABNORMAL LOW (ref 4.0–10.5)

## 2018-07-29 LAB — COMPREHENSIVE METABOLIC PANEL
ALT: 10 U/L (ref 0–44)
AST: 16 U/L (ref 15–41)
Albumin: 4.1 g/dL (ref 3.5–5.0)
Alkaline Phosphatase: 59 U/L (ref 38–126)
Anion gap: 8 (ref 5–15)
BUN: 20 mg/dL (ref 8–23)
CO2: 26 mmol/L (ref 22–32)
CREATININE: 0.95 mg/dL (ref 0.61–1.24)
Calcium: 9.2 mg/dL (ref 8.9–10.3)
Chloride: 107 mmol/L (ref 98–111)
GFR calc Af Amer: >60 mL/min (ref >60)
GFR calc non Af Amer: >60 mL/min (ref >60)
Glucose, Bld: 101 mg/dL — ABNORMAL HIGH (ref 70–99)
Potassium: 4.2 mmol/L (ref 3.5–5.1)
Sodium: 141 mmol/L (ref 135–145)
Total Bilirubin: 0.5 mg/dL (ref 0.3–1.2)
Total Protein: 7.1 g/dL (ref 6.5–8.1)

## 2018-07-29 LAB — PSA: Prostatic Specific Antigen: 4.13 ng/mL — ABNORMAL HIGH (ref 0.00–4.00)

## 2018-07-29 MED ORDER — DENOSUMAB 120 MG/1.7ML ~~LOC~~ SOLN
120.0000 mg | Freq: Once | SUBCUTANEOUS | Status: AC
  Administered 2018-07-29: 120 mg via SUBCUTANEOUS
  Filled 2018-07-29: qty 1.7

## 2018-07-29 NOTE — Assessment & Plan Note (Addendum)
#   Castrate resistant prostate cancer-stage IV; on Trelstar q 69M [03/09/2018]; Currently on Xtandi- MRI Dec 2019 -T12 kyphoplasty stable L2-3 epidural-stable status post radiation; December 2019 PSA 4/improving.  # Overall clinically  STABLE.  Continue Xtandi at this time.  Discussed that we will repeat imaging in the next 2 months or so.  #Bone mets/pain second malignancy-stable.  On ibuprofenl prn.  On X-geva q 4 weeks.   # DISPOSITION: # Treatment today # Follow up 12th/ or after that week- MD/ labs- cbc/cmp/PSA/x-geva-Dr.B

## 2018-07-29 NOTE — Progress Notes (Signed)
[place] OFFICE PROGRESS NOTE  Patient Care Team: System, Provider Not In as PCP - General Royston Cowper, MD (Urology)  Cancer Staging Cancer of prostate [facility]) Staging form: Prostate, AJCC 7th Edition - Clinical: Stage IV (T2, M1) - Signed by Evlyn Kanner, NP on 01/07/2015    Oncology History   # FEB 2014- Prostate cancer Stage II [T2N0]; PA-18; [Dr.Wolfe]  # May 2015-STAGE IV;  PSA 90 on ADT; CT/Bone scan-extensive mets;  casodex+ Trelstar  # June 2016- Castration resistant prostate cancer/[facility] protocol- ZYTIGA plus minus XTANDI; June 28th-CT-C/A/P- Stable Retrocrural/RP/Pelvic LN;Bone scan- Stable sclerotic lesions.   # MARCH 2019- Taken off trial [ based on interm analysis]; continue X-tandi [given not needing steroids]  # July 2019- CT/bone scan stable; MRI lumbar spine- epidural extension/L3 spinal nerves involvement; on RT [finish sep 12th]  # vertebroplasty [[facility]] on sep 13th 2019   # RLL [~53m lung nodule] s/p Bx- Cryptococcus- ID/Dr.Fitzgerald- on diflucan   # Xgeva  # MOLECULAR TESTING/omniseq- BRCA-2 MUTATED; PDL-1- NEG; MSS; N-TRK-NEG  # GENETICS- BRCA-NEG; hetrozygous MUTHY; recommended family work up --------------------------------------------------    DIAGNOSIS: _0  Metastatic prostate cancer  STAGE: 4    ;GOALS: Palliative  CURRENT/MOST RECENT THERAPY-X-tandi+ Trelstar      [place]cer of prostate ([place]   08/17/2012 Initial Diagnosis    Cancer of prostate, stage II    12/03/2013 Progression       10/13/2014 Progression        Prostate cancer metastatic to multiple sites ([facility]      INTERVAL HISTORY:  William MORONTA89y.o.  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  The interim patient was evaluated by Dr. YCari Carawayneurosurgery. As patient pain is currently well  controlled no surgical interventions recommended.  Patient continues to have intermittent mild back discomfort for which he has been taking ibuprofen as needed.  Denies any weakness in his legs.  He continues pain Xtandi.  No fatigue no headaches.  No seizures.   Review of Systems  Constitutional: Positive for malaise/fatigue. Negative for chills, diaphoresis and fever.  HENT: Negative for nosebleeds and sore throat.   Eyes: Negative for double vision.  Respiratory: Negative for cough, hemoptysis, sputum production, shortness of breath and wheezing.   Cardiovascular: Negative for chest pain, palpitations, orthopnea and leg swelling.  Gastrointestinal: Negative for abdominal pain, blood in stool, constipation, diarrhea, heartburn, melena, nausea and vomiting.  Genitourinary: Negative for dysuria, frequency and urgency.  Musculoskeletal: Positive for back pain and joint pain.  Skin: Negative.  Negative for itching and rash.  Neurological: Negative for dizziness, focal weakness, weakness and headaches.  Endo/Heme/Allergies: Does not bruise/bleed easily.  Psychiatric/Behavioral: Negative for depression. The patient does not have insomnia.       PAST MEDICAL HISTORY :  Past Medical History:  Diagnosis Date  . Cancer of prostate ([place] 07/2012  . Family history of leukemia   . [place]state cancer ([place]     PAST SURGICAL HISTORY :   Past Surgical History:  Procedure Laterality Date  . EXTRACORPOREAL SHOCK WAVE LITHOTRIPSY Left 05/20/2015   Procedure: EXTRACORPOREAL SHOCK WAVE LITHOTRIPSY (ESWL);  Surgeon: MRoyston Cowper MD;  Location: [facility] ORS;  Service: Urology;  Laterality: Left;  . EXTRACORPOREAL SHOCK WAVE LITHOTRIPSY Right 12/21/2016   Procedure: EXTRACORPOREAL SHOCK WAVE LITHOTRIPSY (ESWL);  Surgeon: WRoyston Cowper MD;  Location: [facility] ORS;  Service: Urology;  Laterality: Right;  . IR RADIOLOGIST EVAL & MGMT  02/28/2018    FAMILY HISTORY :   Family History  Problem Relation Age of  Onset  . Alzheimer's disease Father   . Lung cancer Maternal Grandfather 65  . Leukemia Paternal Grandmother 51  . COPD Paternal Grandfather   . Cancer Maternal Uncle        type unk, dx 80's    SOCIAL HISTORY:   Social History   Tobacco Use  . Smoking status: Never Smoker  . Smokeless tobacco: Never Used  Substance Use Topics  . Alcohol use: No  . Drug use: No    ALLERGIES:  is allergic to no known allergies.  MEDICATIONS:  Current Outpatient Medications  Medication Sig Dispense Refill  . enzalutamide (XTANDI) 40 MG capsule Take 4 capsules (160 mg total) by mouth daily. 120 capsule 2  . gabapentin (NEURONTIN) 100 MG capsule Take 100 mg by mouth 3 (three) times daily.    Marland Kitchen HYDROcodone-acetaminophen (NORCO/VICODIN) 5-325 MG tablet Take 1 tablet by mouth every 6 (six) hours as needed for moderate pain.    Marland Kitchen ondansetron (ZOFRAN) 8 MG tablet Take 1 tablet (8 mg total) by mouth every 8 (eight) hours as needed for nausea or vomiting. 20 tablet 0  . oxybutynin (DITROPAN-XL) 5 MG 24 hr tablet Take 1 tablet (5 mg total) by mouth at bedtime. 90 tablet 3  . promethazine (PHENERGAN) 25 MG tablet Take 1 tablet (25 mg total) by mouth every 6 (six) hours as needed for nausea or vomiting. 30 tablet 0  . tamsulosin (FLOMAX) 0.4 MG CAPS capsule Take 1 capsule (0.4 mg total) by mouth daily. 30 capsule 11  . traMADol (ULTRAM) 50 MG tablet Take 1 tablet (50 mg total) by mouth every 8 (eight) hours as needed. 45 tablet 1  . Triptorelin Pamoate (TRELSTAR) 22.5 MG injection Inject 22.5 mg into the muscle every 6 (six) months.    . valACYclovir (VALTREX) 1000 MG tablet Take 1 tablet (1,000 mg total) by mouth 2 (two) times daily. 60 tablet 6   No current facility-administered medications for this visit.     PHYSICAL EXAMINATION: ECOG PERFORMANCE STATUS: 1 - Symptomatic but completely ambulatory  BP (!) 143/75 (BP Location: Left Arm, Patient Position: Sitting, Cuff Size: Normal)   Pulse (!) 57    Temp (!) 97.2 F (36.2 C) (Tympanic)   Resp 16   Wt 159 lb 9.6 oz (72.4 kg)   BMI 21.65 kg/m   Filed Weights   07/29/18 1013  Weight: 159 lb 9.6 oz (72.4 kg)    Physical Exam  Constitutional: He is oriented to person, place, and time and well-developed, well-nourished, and in no distress.  Alone.  Walking by himself.  HENT:  Head: Normocephalic and atraumatic.  Mouth/Throat: Oropharynx is clear and moist. No oropharyngeal exudate.  Eyes: Pupils are equal, round, and reactive to light.  Neck: Normal range of motion. Neck supple.  Cardiovascular: Normal rate and regular rhythm.  Pulmonary/Chest: No respiratory distress. He has no wheezes.  Abdominal: Soft. Bowel sounds are normal. He exhibits no distension and no mass. There is no abdominal tenderness. There is no rebound and no guarding.  Musculoskeletal: Normal range of motion.        General: No tenderness or edema.  Neurological: He is alert and oriented to person, place, and time.  Skin: Skin is warm.  Psychiatric: Affect normal.       LABORATORY DATA:  I have reviewed  the data as listed    Component Value Date/Time   NA 141 07/29/2018 0909   NA 137 11/12/2014 1415   K 4.2 07/29/2018 0909   K 4.1 11/12/2014 1415   CL 107 07/29/2018 0909   CL 105 11/12/2014 1415   CO2 26 07/29/2018 0909   CO2 24 11/12/2014 1415   GLUCOSE 101 (H) 07/29/2018 0909   GLUCOSE 98 11/12/2014 1415   BUN 20 07/29/2018 0909   BUN 24 (H) 11/12/2014 1415   CREATININE 0.95 07/29/2018 0909   CREATININE 1.02 11/12/2014 1415   CALCIUM 9.2 07/29/2018 0909   CALCIUM 9.2 11/12/2014 1415   PROT 7.1 07/29/2018 0909   PROT 7.4 11/12/2014 1415   ALBUMIN 4.1 07/29/2018 0909   ALBUMIN 4.4 11/12/2014 1415   AST 16 07/29/2018 0909   AST 23 11/12/2014 1415   ALT 10 07/29/2018 0909   ALT 16 (L) 11/12/2014 1415   ALKPHOS 59 07/29/2018 0909   ALKPHOS 103 11/12/2014 1415   BILITOT 0.5 07/29/2018 0909   BILITOT 0.4 11/12/2014 1415   GFRNONAA >60  07/29/2018 0909   GFRNONAA >60 11/12/2014 1415   GFRAA >60 07/29/2018 0909   GFRAA >60 11/12/2014 1415    No results found for: SPEP, UPEP  Lab Results  Component Value Date   WBC 3.7 (L) 07/29/2018   NEUTROABS 1.8 07/29/2018   HGB 12.3 (L) 07/29/2018   HCT 37.7 (L) 07/29/2018   MCV 100.5 (H) 07/29/2018   PLT 200 07/29/2018      Chemistry      Component Value Date/Time   NA 141 07/29/2018 0909   NA 137 11/12/2014 1415   K 4.2 07/29/2018 0909   K 4.1 11/12/2014 1415   CL 107 07/29/2018 0909   CL 105 11/12/2014 1415   CO2 26 07/29/2018 0909   CO2 24 11/12/2014 1415   BUN 20 07/29/2018 0909   BUN 24 (H) 11/12/2014 1415   CREATININE 0.95 07/29/2018 0909   CREATININE 1.02 11/12/2014 1415      Component Value Date/Time   CALCIUM 9.2 07/29/2018 0909   CALCIUM 9.2 11/12/2014 1415   ALKPHOS 59 07/29/2018 0909   ALKPHOS 103 11/12/2014 1415   AST 16 07/29/2018 0909   AST 23 11/12/2014 1415   ALT 10 07/29/2018 0909   ALT 16 (L) 11/12/2014 1415   BILITOT 0.5 07/29/2018 0909   BILITOT 0.4 11/12/2014 1415       RADIOGRAPHIC STUDIES: I have personally reviewed the radiological images as listed and agreed with the findings in the report. No results found.   ASSESSMENT & PLAN:  Prostate cancer metastatic to multiple sites [facility]) # Castrate resistant prostate cancer-stage IV; on Trelstar q 52M [03/09/2018]; Currently on Xtandi- MRI Dec 2019 -T12 kyphoplasty stable L2-3 epidural-stable status post radiation; December 2019 PSA 4/improving.  # Overall clinically  STABLE.  Continue Xtandi at this time.  Discussed that we will repeat imaging in the next 2 months or so.  #Bone mets/pain second malignancy-stable.  On ibuprofenl prn.  On X-geva q 4 weeks.   # DISPOSITION: # Treatment today # Follow up 12th/ or after that week- MD/ labs- cbc/cmp/PSA/x-geva-Dr.B    No orders of the defined types were placed in this encounter.  All questions were answered. The patient knows to  call the clinic with any problems, questions or concerns.      Cammie Sickle, MD 07/29/2018 12:56 PM

## 2018-07-31 ENCOUNTER — Telehealth: Payer: Self-pay | Admitting: Pharmacy Technician

## 2018-07-31 NOTE — Telephone Encounter (Signed)
Oral Oncology Patient Advocate Encounter   Was successful in securing patient an $39 grant from Patient [facility]) to provide copayment coverage for William Wright.  This will keep the out of pocket expense at $0.     I have spoken with the patient.    The billing information is as follows and has been shared with [place].   Member ID: [id number] Group ID: [id number] RxBin: [id number] Dates of Eligibility: 04/19/18 through 07/18/2019  [place] Patient [place] Phone [phone] Fax [phone] 07/31/2018 10:57 AM

## 2018-08-05 ENCOUNTER — Other Ambulatory Visit: Payer: Medicare Other

## 2018-08-05 DIAGNOSIS — C61 Malignant neoplasm of prostate: Secondary | ICD-10-CM

## 2018-08-05 NOTE — Progress Notes (Signed)
PATIENT NAME: William Wright DOB: 1951/05/03 MRN: [id number]  PRIMARY CARE PROVIDER: System, Provider Not In  RESPONSIBLE PARTY:  Acct ID - Guarantor Home Phone Work Phone Relationship Acct Type  [id number] [name]915 [phone]  Self P/F     [address] [place], [place] [address]    PLAN OF CARE and INTERVENTIONS:               1.  GOALS OF CARE/ ADVANCE CARE PLANNING: Patient wants to remain free from infection.  Patient wants to remian at home.                   2.  PATIENT/CAREGIVER EDUCATION:  Education to patient take meds to control nerve pain.  Education to patient and wife on signs and symptoms of infection.               4. PERSONAL EMERGENCY PLAN:  In Place               5.  DISEASE STATUS:Patient having increased pain in back and legs.   HISTORY OF PRESENT ILLNESS:    CODE STATUS: Unknown  ADVANCED DIRECTIVES: Unknown MOST FORM: Unknown PPS: 60%   PHYSICAL EXAM:   VITALS:See Vital signs   LUNGS: clear to auscultation  CARDIAC: Cor RRR}  EXTREMITIES:No edema SKIN: Skin color, texture, turgor normal. No rashes or lesions  NEURO: Alert and oriented x 4  Palliative Care visit made to visit patient.  Patient's wife [name] in home with patient.  Patient updates RN when he was diagnosed with cancer.   Patient reports having pain in his upper legs and back. Patient currently rates pain at 2 on pain scale.  Patient reports he takes Ibuprofen for pain.  Patient states Ibuprofen works as well as opioids for pain.  Patient has Gabapentin in the home and patient states he will try taking Gabapentin again for pain in his back.  Patient reports his appetite remains good.  Patient states he wakes every 2-3 hours due to having to urinate.  Patient reports he had MRI recently that showed no new growth of tumors.  Patient reports last PSA level was 4.1.  Patient and wife encouraged to contact Palliative Care with concerns.          [name], RN

## 2018-08-12 MED FILL — XTANDI 40 MG CAPSULE: 40 | 30 days supply | Qty: 120 | Fill #1

## 2018-08-29 ENCOUNTER — Inpatient Hospital Stay: Payer: Medicare Other | Attending: Internal Medicine

## 2018-08-29 ENCOUNTER — Other Ambulatory Visit: Payer: Medicare Other

## 2018-08-29 ENCOUNTER — Inpatient Hospital Stay: Payer: Medicare Other

## 2018-08-29 ENCOUNTER — Inpatient Hospital Stay (HOSPITAL_BASED_OUTPATIENT_CLINIC_OR_DEPARTMENT_OTHER): Payer: Medicare Other | Admitting: Internal Medicine

## 2018-08-29 ENCOUNTER — Encounter: Payer: Self-pay | Admitting: Internal Medicine

## 2018-08-29 DIAGNOSIS — R911 Solitary pulmonary nodule: Secondary | ICD-10-CM

## 2018-08-29 DIAGNOSIS — M549 Dorsalgia, unspecified: Secondary | ICD-10-CM | POA: Insufficient documentation

## 2018-08-29 DIAGNOSIS — Z79899 Other long term (current) drug therapy: Secondary | ICD-10-CM

## 2018-08-29 DIAGNOSIS — Z923 Personal history of irradiation: Secondary | ICD-10-CM

## 2018-08-29 DIAGNOSIS — R5381 Other malaise: Secondary | ICD-10-CM | POA: Insufficient documentation

## 2018-08-29 DIAGNOSIS — Z806 Family history of leukemia: Secondary | ICD-10-CM

## 2018-08-29 DIAGNOSIS — Z79818 Long term (current) use of other agents affecting estrogen receptors and estrogen levels: Secondary | ICD-10-CM | POA: Insufficient documentation

## 2018-08-29 DIAGNOSIS — C7951 Secondary malignant neoplasm of bone: Secondary | ICD-10-CM | POA: Diagnosis not present

## 2018-08-29 DIAGNOSIS — C61 Malignant neoplasm of prostate: Secondary | ICD-10-CM

## 2018-08-29 DIAGNOSIS — R5383 Other fatigue: Secondary | ICD-10-CM | POA: Insufficient documentation

## 2018-08-29 DIAGNOSIS — Z515 Encounter for palliative care: Secondary | ICD-10-CM

## 2018-08-29 LAB — COMPREHENSIVE METABOLIC PANEL
ALT: 13 U/L (ref 0–44)
AST: 15 U/L (ref 15–41)
Albumin: 4.1 g/dL (ref 3.5–5.0)
Alkaline Phosphatase: 67 U/L (ref 38–126)
Anion gap: 7 (ref 5–15)
BUN: 21 mg/dL (ref 8–23)
CO2: 27 mmol/L (ref 22–32)
Calcium: 9 mg/dL (ref 8.9–10.3)
Chloride: 105 mmol/L (ref 98–111)
Creatinine, Ser: 0.89 mg/dL (ref 0.61–1.24)
GFR calc non Af Amer: >60 mL/min (ref >60)
GLUCOSE: 103 mg/dL — AB (ref 70–99)
Potassium: 4.1 mmol/L (ref 3.5–5.1)
Sodium: 139 mmol/L (ref 135–145)
Total Bilirubin: 0.8 mg/dL (ref 0.3–1.2)
Total Protein: 7 g/dL (ref 6.5–8.1)

## 2018-08-29 LAB — CBC WITH DIFFERENTIAL/PLATELET
Abs Immature Granulocytes: 0.01 10*3/uL (ref 0.00–0.07)
Basophils Absolute: 0 10*3/uL (ref 0.0–0.1)
Basophils Relative: 1 %
Eosinophils Absolute: 0.3 10*3/uL (ref 0.0–0.5)
Eosinophils Relative: 7 %
HCT: 37.6 % — ABNORMAL LOW (ref 39.0–52.0)
HEMOGLOBIN: 12.3 g/dL — AB (ref 13.0–17.0)
Immature Granulocytes: 0 %
Lymphocytes Relative: 26 %
Lymphs Abs: 1.1 10*3/uL (ref 0.7–4.0)
MCH: 33 pg (ref 26.0–34.0)
MCHC: 32.7 g/dL (ref 30.0–36.0)
MCV: 100.8 fL — ABNORMAL HIGH (ref 80.0–100.0)
MONO ABS: 0.5 10*3/uL (ref 0.1–1.0)
MONOS PCT: 12 %
NEUTROS PCT: 54 %
NRBC: 0 % (ref 0.0–0.2)
Neutro Abs: 2.2 10*3/uL (ref 1.7–7.7)
Platelets: 196 10*3/uL (ref 150–400)
RBC: 3.73 MIL/uL — ABNORMAL LOW (ref 4.22–5.81)
RDW: 13.8 % (ref 11.5–15.5)
WBC: 4 10*3/uL (ref 4.0–10.5)

## 2018-08-29 LAB — PSA: Prostatic Specific Antigen: 5.13 ng/mL — ABNORMAL HIGH (ref 0.00–4.00)

## 2018-08-29 MED ORDER — DENOSUMAB 120 MG/1.7ML ~~LOC~~ SOLN
120.0000 mg | Freq: Once | SUBCUTANEOUS | Status: AC
  Administered 2018-08-29: 120 mg via SUBCUTANEOUS
  Filled 2018-08-29: qty 1.7

## 2018-08-29 NOTE — Progress Notes (Signed)
PATIENT NAME: William Wright DOB: [birth year] MRN: [id number]  PRIMARY CARE PROVIDER: System, Provider Not In  RESPONSIBLE PARTY:  Acct ID - Guarantor Home Phone Work Phone Relationship Acct Type  [id number] [name]  Self P/F     [address] [place],  [address]    PLAN OF CARE and INTERVENTIONS:               1.  GOALS OF CARE/ ADVANCE CARE PLANNING:  Patient wants to remain at home with wife.  Patient wants to remain independent and function at his optimal level.                   2.  PATIENT/CAREGIVER EDUCATION:  Education on symptom management, education on signs and symptoms of infection.               4. PERSONAL EMERGENCY PLAN:  In Place               5.  DISEASE STATUS:Patient continues to have pain from bone mets but remains reluctant to take pain meds.       HISTORY OF PRESENT ILLNESS:    CODE STATUS: Unknown  ADVANCED DIRECTIVES: Unknown MOST FORM: Unknown PPS: 60%   PHYSICAL EXAM:   VITALS:See Vital signs  LUNGS: decreased breath sounds CARDIAC: Cor RRR EXTREMITIES: negative SKIN: Warm and dry without lesions  NEURO: negative alert and oriented x 4  Palliative Care visit made to patient's home.  Patient's [name] in home with patient.  Patient alert and oriented x 4.  Patient informs RN that he feels he has been doing well.  Patient had appointment at the [facility] with Dr. [name] this AM.  Lucilla Lame reports MD made no changes in his medications.  Patient states MD felt he was doing well and informed him to "keep doing what he has been doing."  Patient reports he continues to eat a plant based diet and only drinks water.  Patient reports he has been sleeping well.  Patient has not suffered any falls.  Education to patient on good handwashing to prevent infections.  RN enocouraged patient to take pain meds for pain.  Patient encouraged to call Palliative Care with questions or concerns.//VJ                [name], RN

## 2018-08-29 NOTE — Progress Notes (Signed)
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/p Bx- Cryptococcus- ID/Dr.Fitzgerald- on diflucan   # Xgeva  # MOLECULAR TESTING/omniseq- BRCA-2 MUTATED; PDL-1- NEG; MSS; N-TRK-NEG  # GENETICS- BRCA-NEG; hetrozygous MUTHY; recommended family work up --------------------------------------------------    DIAGNOSIS: _0  Metastatic prostate cancer  STAGE: 4    ;GOALS: Palliative  CURRENT/MOST RECENT THERAPY-X-tandi+ Trelstar      Cancer of prostate ([facility]   08/17/2012 Initial Diagnosis    Cancer of prostate, stage II    12/03/2013 Progression       10/13/2014 Progression        Prostate cancer metastatic to multiple sites ([facility]      INTERVAL HISTORY:  William [id number].[id number].  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  Patient's back pain has improved overall.  He is taking OTC pain medication as needed.  Denies any  weakness in the legs.  No unusual fatigue or headaches or seizures.  [place]   Review of Systems  Constitutional: Positive for malaise/fatigue. Negative for chills, diaphoresis and fever.  HENT: Negative for nosebleeds and sore throat.   Eyes: Negative for double vision.  Respiratory: Negative for cough, hemoptysis, sputum production, shortness of breath and wheezing.   Cardiovascular: Negative for chest pain, palpitations, orthopnea and leg swelling.  Gastrointestinal: Negative for abdominal pain, blood in stool, constipation, diarrhea, heartburn, melena, nausea and vomiting.  Genitourinary: Negative for dysuria, frequency and urgency.  Musculoskeletal: Positive for back pain and joint pain.  Skin: Negative.  Negative for itching and rash.  Neurological: Negative for dizziness, focal weakness, weakness and headaches.  Endo/Heme/Allergies: Does not bruise/bleed easily.  Psychiatric/Behavioral: Negative for depression. The patient does not have insomnia.       PAST MEDICAL HISTORY :  Past Medical History:  Diagnosis Date  . [facility] of [place] ([facility] 07[place]  . Family history of leukemia   . Prostate cancer ([facility]     PAST SURGICAL HISTORY :   Past Surgical History:  Procedure Laterality Date  . EXTRACORPOREAL SHOCK WAVE LITHOTRIPSY Left 05/20/2015   Procedure: EXTRACORPOREAL SHOCK WAVE LITHOTRIPSY (ESWL);  Surgeon: MRoyston Cowper MD;  Location: [facility] ORS;  Service: Urology;  Laterality: Left;  . EXTRACORPOREAL SHOCK WAVE LITHOTRIPSY Right 12/21/2016   Procedure: EXTRACORPOREAL SHOCK WAVE LITHOTRIPSY (ESWL);  Surgeon: WRoyston Cowper MD;  Location: [facility] ORS;  Service: Urology;  Laterality: Right;  . IR RADIOLOGIST EVAL & MGMT  02/28/2018    FAMILY HISTORY :   Family History  Problem Relation Age of Onset  . Alzheimer's disease Father   . Lung cancer Maternal Grandfather 74  . Leukemia Paternal Grandmother 55  . COPD Paternal Grandfather   . Cancer Maternal Uncle         type unk, dx 80's    SOCIAL HISTORY:   Social History   Tobacco Use  . Smoking status: Never Smoker  . Smokeless tobacco: Never Used  Substance Use Topics  . Alcohol use: No  . Drug use: No    ALLERGIES:  is allergic to no known allergies.  MEDICATIONS:  Current Outpatient Medications  Medication Sig Dispense Refill  . enzalutamide (XTANDI) 40 MG capsule Take 4 capsules (160 mg total) by mouth daily. 120 capsule 2  . gabapentin (NEURONTIN) 100 MG capsule Take 100 mg by mouth 3 (three) times daily.    Marland Kitchen HYDROcodone-acetaminophen (NORCO/VICODIN) 5-325 MG tablet Take 1 tablet by mouth every 6 (six) hours as needed for moderate pain.    Marland Kitchen ondansetron (ZOFRAN) 8 MG tablet Take 1 tablet (8 mg total) by mouth every 8 (eight) hours as needed for nausea or vomiting. 20 tablet 0  . oxybutynin (DITROPAN-XL) 5 MG 24 hr tablet Take 1 tablet (5 mg total) by mouth at bedtime. 90 tablet 3  . promethazine (PHENERGAN) 25 MG tablet Take 1 tablet (25 mg total) by mouth every 6 (six) hours as needed for nausea or vomiting. 30 tablet 0  . tamsulosin (FLOMAX) 0.4 MG CAPS capsule Take 1 capsule (0.4 mg total) by mouth daily. 30 capsule 11  . traMADol (ULTRAM) 50 MG tablet Take 1 tablet (50 mg total) by mouth every 8 (eight) hours as needed. 45 tablet 1  . Triptorelin Pamoate (TRELSTAR) 22.5 MG injection Inject 22.5 mg into the muscle every 6 (six) months.    . valACYclovir (VALTREX) 1000 MG tablet Take 1 tablet (1,000 mg total) by mouth 2 (two) times daily. 60 tablet 6   No current facility-administered medications for this visit.     PHYSICAL EXAMINATION: ECOG PERFORMANCE STATUS: 1 - Symptomatic but completely ambulatory  BP 138/77 (BP Location: Left Arm, Patient Position: Sitting, Cuff Size: Normal)   Pulse (!) 56   Wt 161 lb 12.8 oz (73.4 kg)   BMI 21.94 kg/m   Filed Weights   08/29/18 1053  Weight: 161 lb 12.8 oz (73.4 kg)    Physical Exam  Constitutional: He is oriented to person,  place, and time and well-developed, well-nourished, and in no distress.  Alone.  Walking by himself.  HENT:  Head: Normocephalic and atraumatic.  Mouth/Throat: Oropharynx is clear and moist. No oropharyngeal exudate.  Eyes: Pupils are equal, round, and reactive to light.  Neck: Normal range of motion. Neck supple.  Cardiovascular: Normal rate and regular rhythm.  Pulmonary/Chest: No respiratory distress. He has no wheezes.  Abdominal: Soft. Bowel sounds are normal. He exhibits no distension and no mass. There is no abdominal tenderness. There is no rebound and no guarding.  Musculoskeletal: Normal range of motion.        General: No tenderness or edema.  Neurological: He is alert and oriented to person, place, and time.  Skin: Skin is warm.  Psychiatric: Affect normal.       LABORATORY DATA:  I have reviewed the data as listed    Component Value Date/Time   NA 139 08/29/2018 1014   NA 137 11/12/2014 1415   K 4.1 08/29/2018 1014   K 4.1 11/12/2014 1415   CL 105 08/29/2018  1014   CL 105 11/12/2014 1415   CO2 27 08/29/2018 1014   CO2 24 11/12/2014 1415   GLUCOSE 103 (H) 08/29/2018 1014   GLUCOSE 98 11/12/2014 1415   BUN 21 08/29/2018 1014   BUN 24 (H) 11/12/2014 1415   CREATININE 0.89 08/29/2018 1014   CREATININE 1.02 11/12/2014 1415   CALCIUM 9.0 08/29/2018 1014   CALCIUM 9.2 11/12/2014 1415   PROT 7.0 08/29/2018 1014   PROT 7.4 11/12/2014 1415   ALBUMIN 4.1 08/29/2018 1014   ALBUMIN 4.4 11/12/2014 1415   AST 15 08/29/2018 1014   AST 23 11/12/2014 1415   ALT 13 08/29/2018 1014   ALT 16 (L) 11/12/2014 1415   ALKPHOS 67 08/29/2018 1014   ALKPHOS 103 11/12/2014 1415   BILITOT 0.8 08/29/2018 1014   BILITOT 0.4 11/12/2014 1415   GFRNONAA >60 08/29/2018 1014   GFRNONAA >60 11/12/2014 1415   GFRAA >60 08/29/2018 1014   GFRAA >60 11/12/2014 1415    No results found for: SPEP, UPEP  Lab Results  Component Value Date   WBC 4.0 08/29/2018   NEUTROABS 2.2 08/29/2018    HGB 12.3 (L) 08/29/2018   HCT 37.6 (L) 08/29/2018   MCV 100.8 (H) 08/29/2018   PLT 196 08/29/2018      Chemistry      Component Value Date/Time   NA 139 08/29/2018 1014   NA 137 11/12/2014 1415   K 4.1 08/29/2018 1014   K 4.1 11/12/2014 1415   CL 105 08/29/2018 1014   CL 105 11/12/2014 1415   CO2 27 08/29/2018 1014   CO2 24 11/12/2014 1415   BUN 21 08/29/2018 1014   BUN 24 (H) 11/12/2014 1415   CREATININE 0.89 08/29/2018 1014   CREATININE 1.02 11/12/2014 1415      Component Value Date/Time   CALCIUM 9.0 08/29/2018 1014   CALCIUM 9.2 11/12/2014 1415   ALKPHOS 67 08/29/2018 1014   ALKPHOS 103 11/12/2014 1415   AST 15 08/29/2018 1014   AST 23 11/12/2014 1415   ALT 13 08/29/2018 1014   ALT 16 (L) 11/12/2014 1415   BILITOT 0.8 08/29/2018 1014   BILITOT 0.4 11/12/2014 1415       RADIOGRAPHIC STUDIES: I have personally reviewed the radiological images as listed and agreed with the findings in the report. No results found.   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  # Overall clinically  Stable.   Continue Xtandi at this time.  Will order imaging at next visit.  #Bone mets/pain second malignancy--stable.  OTC ibuprofen.  X-geva q 4 weeks.   # DISPOSITION: # Treatment today # Follow up in 1 month-MD/ labs- cbc/cmp/PSA/x-geva/Trelstar-Dr.B   Orders Placed This Encounter  Procedures  . CBC with Differential/Platelet    Standing Status:   Future    Standing Expiration Date:   08/30/2019  . Comprehensive metabolic panel    Standing Status:   Future    Standing Expiration Date:   08/30/2019  . PSA    Standing Status:   Future    Standing Expiration Date:   08/30/2019   All questions were answered. The patient knows to call the clinic  with any problems, questions or concerns.      Cammie Sickle, MD 08/29/2018 1:34 PM

## 2018-08-29 NOTE — Assessment & Plan Note (Addendum)
#   Castrate resistant prostate cancer-stage IV; on Trelstar q 55M [03/09/2018]; Currently on Xtandi- MRI Dec 2019 -T12 kyphoplasty stable L2-3 epidural-stable status post radiation; Jan 2020-PSA 4/improving.  # Overall clinically  Stable.   Continue Xtandi at this time.  Will order imaging at next visit.  #Bone mets/pain second malignancy--stable.  OTC ibuprofen.  X-geva q 4 weeks.   # DISPOSITION: # Treatment today # Follow up in 1 month-MD/ labs- cbc/cmp/PSA/x-geva/Trelstar-Dr.B

## 2018-09-06 MED FILL — XTANDI 40 MG CAPSULE: 40 | 30 days supply | Qty: 120 | Fill #2

## 2018-09-23 ENCOUNTER — Other Ambulatory Visit: Payer: Medicare Other

## 2018-09-23 DIAGNOSIS — Z515 Encounter for palliative care: Secondary | ICD-10-CM

## 2018-09-24 NOTE — Progress Notes (Signed)
PATIENT NAME: William Wright DOB: 09-Oct-1950 MRN: [id number]  PRIMARY CARE PROVIDER: System, Provider Not In  RESPONSIBLE PARTY:  Acct ID - Guarantor Home Phone Work Phone Relationship Acct Type  [id number] [name]  Self P/F     [address] [place], [place] [address]    PLAN OF CARE and INTERVENTIONS:               1.  GOALS OF CARE/ ADVANCE CARE PLANNING: Remain at home with family.  Patient hopeful that disease is not progressing.                2.  PATIENT/CAREGIVER EDUCATION:  Education on precautions to prevent infection.               3.  DISEASE STATUS: Patient reports he feels good and continues to have a positive attitude.  Patient to see MD on Thursday 09-26-18.     HISTORY OF PRESENT ILLNESS:    CODE STATUS:   ADVANCED DIRECTIVES: Y MOST FORM: No PPS: 60%   PHYSICAL EXAM:   VITALS:See vital signs  LUNGS: clear to auscultation  CARDIAC: Cor RRR EXTREMITIES: Trace edema SKIN: Skin color, texture, turgor normal. No rashes or lesions  NEURO: negative   Palliative Care visit made to patient's home.  Patient's wife [name] in home with patient.  Patient ambulation well and answers the door when RN arrives.  Patient denies having any falls.  Patient alert and oriented and denies having any pain at he present time. Patient reports he continues to have intermittent pain in the top of his legs.  Patient has not been taking any pain meds other than Tylenol as needed.  Patient has Neurontin in the home but is not taking.  Patient to se MD on Thursday and receive monthly Xgeva injection.  Patient's appetite remains good and patient has gained 4 pounds since last Palliative visit.  Patient remains in agreement with Palliative Care services.  Patient encouraged to call with questions or concerns.            [name], RN

## 2018-09-26 ENCOUNTER — Inpatient Hospital Stay: Payer: Medicare Other | Admitting: Internal Medicine

## 2018-09-26 ENCOUNTER — Inpatient Hospital Stay: Payer: Medicare Other

## 2018-09-27 ENCOUNTER — Other Ambulatory Visit: Payer: Self-pay

## 2018-09-27 ENCOUNTER — Inpatient Hospital Stay (HOSPITAL_BASED_OUTPATIENT_CLINIC_OR_DEPARTMENT_OTHER): Payer: Medicare Other | Admitting: Internal Medicine

## 2018-09-27 ENCOUNTER — Inpatient Hospital Stay: Payer: Medicare Other | Attending: Internal Medicine

## 2018-09-27 ENCOUNTER — Inpatient Hospital Stay: Payer: Medicare Other

## 2018-09-27 ENCOUNTER — Encounter: Payer: Self-pay | Admitting: Internal Medicine

## 2018-09-27 VITALS — BP 123/76 | HR 59 | Temp 97.6°F | Resp 16 | Wt 163.0 lb

## 2018-09-27 DIAGNOSIS — Z79899 Other long term (current) drug therapy: Secondary | ICD-10-CM

## 2018-09-27 DIAGNOSIS — Z79818 Long term (current) use of other agents affecting estrogen receptors and estrogen levels: Secondary | ICD-10-CM

## 2018-09-27 DIAGNOSIS — M546 Pain in thoracic spine: Secondary | ICD-10-CM | POA: Insufficient documentation

## 2018-09-27 DIAGNOSIS — Z801 Family history of malignant neoplasm of trachea, bronchus and lung: Secondary | ICD-10-CM | POA: Diagnosis not present

## 2018-09-27 DIAGNOSIS — C61 Malignant neoplasm of prostate: Secondary | ICD-10-CM

## 2018-09-27 DIAGNOSIS — R911 Solitary pulmonary nodule: Secondary | ICD-10-CM | POA: Insufficient documentation

## 2018-09-27 DIAGNOSIS — Z806 Family history of leukemia: Secondary | ICD-10-CM

## 2018-09-27 DIAGNOSIS — C7951 Secondary malignant neoplasm of bone: Secondary | ICD-10-CM | POA: Diagnosis not present

## 2018-09-27 LAB — COMPREHENSIVE METABOLIC PANEL
ALT: 10 U/L (ref 0–44)
AST: 10 U/L — ABNORMAL LOW (ref 15–41)
Albumin: 4.3 g/dL (ref 3.5–5.0)
Alkaline Phosphatase: 62 U/L (ref 38–126)
Anion gap: 8 (ref 5–15)
BILIRUBIN TOTAL: 0.6 mg/dL (ref 0.3–1.2)
BUN: 28 mg/dL — ABNORMAL HIGH (ref 8–23)
CO2: 26 mmol/L (ref 22–32)
Calcium: 9.4 mg/dL (ref 8.9–10.3)
Chloride: 107 mmol/L (ref 98–111)
Creatinine, Ser: 1 mg/dL (ref 0.61–1.24)
GFR calc Af Amer: >60 mL/min (ref >60)
GFR calc non Af Amer: >60 mL/min (ref >60)
Glucose, Bld: 124 mg/dL — ABNORMAL HIGH (ref 70–99)
Potassium: 3.9 mmol/L (ref 3.5–5.1)
Sodium: 141 mmol/L (ref 135–145)
TOTAL PROTEIN: 7 g/dL (ref 6.5–8.1)

## 2018-09-27 LAB — CBC WITH DIFFERENTIAL/PLATELET
Abs Immature Granulocytes: 0.01 10*3/uL (ref 0.00–0.07)
Basophils Absolute: 0 10*3/uL (ref 0.0–0.1)
Basophils Relative: 0 %
Eosinophils Absolute: 0.4 10*3/uL (ref 0.0–0.5)
Eosinophils Relative: 8 %
HCT: 36.2 % — ABNORMAL LOW (ref 39.0–52.0)
Hemoglobin: 12.1 g/dL — ABNORMAL LOW (ref 13.0–17.0)
Immature Granulocytes: 0 %
Lymphocytes Relative: 22 %
Lymphs Abs: 1 10*3/uL (ref 0.7–4.0)
MCH: 33.2 pg (ref 26.0–34.0)
MCHC: 33.4 g/dL (ref 30.0–36.0)
MCV: 99.5 fL (ref 80.0–100.0)
Monocytes Absolute: 0.5 10*3/uL (ref 0.1–1.0)
Monocytes Relative: 10 %
Neutro Abs: 2.8 10*3/uL (ref 1.7–7.7)
Neutrophils Relative %: 60 %
Platelets: 192 10*3/uL (ref 150–400)
RBC: 3.64 MIL/uL — ABNORMAL LOW (ref 4.22–5.81)
RDW: 13.2 % (ref 11.5–15.5)
WBC: 4.8 10*3/uL (ref 4.0–10.5)
nRBC: 0 % (ref 0.0–0.2)

## 2018-09-27 LAB — PSA: Prostatic Specific Antigen: 4.94 ng/mL — ABNORMAL HIGH (ref 0.00–4.00)

## 2018-09-27 MED ORDER — TRIPTORELIN PAMOATE 22.5 MG IM SUSR
22.5000 mg | Freq: Once | INTRAMUSCULAR | Status: AC
  Administered 2018-09-27: 22.5 mg via INTRAMUSCULAR
  Filled 2018-09-27: qty 22.5

## 2018-09-27 MED ORDER — DENOSUMAB 120 MG/1.7ML ~~LOC~~ SOLN
120.0000 mg | Freq: Once | SUBCUTANEOUS | Status: AC
  Administered 2018-09-27: 120 mg via SUBCUTANEOUS
  Filled 2018-09-27: qty 1.7

## 2018-09-27 NOTE — Assessment & Plan Note (Addendum)
#   Castrate resistant prostate cancer-stage IV; on Trelstar q 65M [09/27/2018]; Currently on Xtandi- MRI Dec 2019 -T12 kyphoplasty stable L2-3 epidural-stable status post radiation; March 2020-PSA 4/improving-~4-5.  # Overall clinically  STABLE.  Continue Xtandi at this time.  We will plan to proceed with a CT scan chest and pelvis prior to next visit.  #Bone mets/pain second malignancy--stable.. OTC ibuprofen.  X-geva q 4 weeks.   # DISPOSITION: # Trelstar/ X-geva today.  # Follow up in 5 weeks-MD/ labs- cbc/cmp/PSA/x-geva; CT C/A/ P-Dr.B

## 2018-09-27 NOTE — Progress Notes (Signed)
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   # MARCH 2019- Taken off trial [ based on interm analysis]; continue X-tandi [given not needing steroids]  # July 2019- CT/bone scan stable; MRI lumbar spine- epidural extension/L3 spinal nerves involvement; on RT [finish sep 12th]  # vertebroplasty [duke] on sep 13th 2019   # RLL [~47m lung nodule] s/p Bx- Cryptococcus- ID/Dr.Fitzgerald- on diflucan   # Xgeva  # MOLECULAR TESTING/omniseq- BRCA-2 MUTATED; PDL-1- NEG; MSS; N-TRK-NEG  # GENETICS- BRCA-NEG; hetrozygous MUTHY; recommended family work up --------------------------------------------------    DIAGNOSIS: '[ ]'$  Metastatic prostate cancer  STAGE: 4    ;GOALS: Palliative  CURRENT/MOST RECENT THERAPY-X-tandi+ Trelstar      Cancer of prostate ([facility]   08/17/2012 Initial Diagnosis    Cancer of prostate, stage II    12/03/2013 Progression       10/13/2014 Progression        Prostate cancer metastatic to multiple sites ([facility]      INTERVAL HISTORY:  William RAMPEY89y.o.  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  Patient continues to take over-the-counter pain medication as needed.  Continues to complain of mild  tingling and pain radiating to his left leg.  Is not getting any worse.  He continues to be on Xtandi.  No falls.  No headaches.   Review of Systems  Constitutional: Positive for malaise/fatigue. Negative for chills, diaphoresis and fever.  HENT: Negative for nosebleeds and sore throat.   Eyes: Negative for double vision.  Respiratory: Negative for cough, hemoptysis, sputum production, shortness of breath and wheezing.   Cardiovascular: Negative for chest pain, palpitations, orthopnea and leg swelling.  Gastrointestinal: Negative for abdominal pain, blood in stool, constipation, diarrhea, heartburn, melena, nausea and vomiting.  Genitourinary: Negative for dysuria, frequency and urgency.  Musculoskeletal: Positive for back pain and joint pain.  Skin: Negative.  Negative for itching and rash.  Neurological: Negative for dizziness, focal weakness, weakness and headaches.  Endo/Heme/Allergies: Does not bruise/bleed easily.  Psychiatric/Behavioral: Negative for depression. The patient does not have insomnia.       PAST MEDICAL HISTORY :  Past Medical History:  Diagnosis Date  . Cancer of prostate ([facility] 07/2012  . Family history of leukemia   . Prostate cancer ([facility]     PAST SURGICAL HISTORY :   Past Surgical History:  Procedure Laterality Date  . EXTRACORPOREAL SHOCK WAVE LITHOTRIPSY Left 05/20/2015   Procedure: EXTRACORPOREAL SHOCK WAVE LITHOTRIPSY (ESWL);  Surgeon: MRoyston Cowper MD;  Location: [facility] ORS;  Service: Urology;  Laterality: Left;  . EXTRACORPOREAL SHOCK WAVE LITHOTRIPSY Right 12/21/2016   Procedure: EXTRACORPOREAL SHOCK WAVE LITHOTRIPSY (ESWL);  Surgeon: WRoyston Cowper MD;  Location: [facility] ORS;  Service: Urology;  Laterality: Right;  . IR RADIOLOGIST EVAL & MGMT  02/28/2018  FAMILY HISTORY :   Family History  Problem Relation Age of Onset  . Alzheimer's disease Father   . Lung cancer Maternal Grandfather 16  . Leukemia Paternal Grandmother 63  . COPD Paternal  Grandfather   . Cancer Maternal Uncle        type unk, dx 80's    SOCIAL HISTORY:   Social History   Tobacco Use  . Smoking status: Never Smoker  . Smokeless tobacco: Never Used  Substance Use Topics  . Alcohol use: No  . Drug use: No    ALLERGIES:  is allergic to no known allergies.  MEDICATIONS:  Current Outpatient Medications  Medication Sig Dispense Refill  . enzalutamide (XTANDI) 40 MG capsule Take 4 capsules (160 mg total) by mouth daily. 120 capsule 2  . gabapentin (NEURONTIN) 100 MG capsule Take 100 mg by mouth 3 (three) times daily.    Marland Kitchen HYDROcodone-acetaminophen (NORCO/VICODIN) 5-325 MG tablet Take 1 tablet by mouth every 6 (six) hours as needed for moderate pain.    Marland Kitchen ondansetron (ZOFRAN) 8 MG tablet Take 1 tablet (8 mg total) by mouth every 8 (eight) hours as needed for nausea or vomiting. 20 tablet 0  . oxybutynin (DITROPAN-XL) 5 MG 24 hr tablet Take 1 tablet (5 mg total) by mouth at bedtime. 90 tablet 3  . promethazine (PHENERGAN) 25 MG tablet Take 1 tablet (25 mg total) by mouth every 6 (six) hours as needed for nausea or vomiting. 30 tablet 0  . tamsulosin (FLOMAX) 0.4 MG CAPS capsule Take 1 capsule (0.4 mg total) by mouth daily. 30 capsule 11  . traMADol (ULTRAM) 50 MG tablet Take 1 tablet (50 mg total) by mouth every 8 (eight) hours as needed. 45 tablet 1  . Triptorelin Pamoate (TRELSTAR) 22.5 MG injection Inject 22.5 mg into the muscle every 6 (six) months.    . valACYclovir (VALTREX) 1000 MG tablet Take 1 tablet (1,000 mg total) by mouth 2 (two) times daily. 60 tablet 6   No current facility-administered medications for this visit.     PHYSICAL EXAMINATION: ECOG PERFORMANCE STATUS: 1 - Symptomatic but completely ambulatory  BP 123/76 (BP Location: Left Arm, Patient Position: Sitting, Cuff Size: Normal)   Pulse (!) 59   Temp 97.6 F (36.4 C) (Tympanic)   Resp 16   Wt 163 lb (73.9 kg)   BMI 22.11 kg/m   Filed Weights   09/27/18 1428  Weight: 163 lb  (73.9 kg)    Physical Exam  Constitutional: He is oriented to person, place, and time and well-developed, well-nourished, and in no distress.  Alone.  Walking by himself.  HENT:  Head: Normocephalic and atraumatic.  Mouth/Throat: Oropharynx is clear and moist. No oropharyngeal exudate.  Eyes: Pupils are equal, round, and reactive to light.  Neck: Normal range of motion. Neck supple.  Cardiovascular: Normal rate and regular rhythm.  Pulmonary/Chest: No respiratory distress. He has no wheezes.  Abdominal: Soft. Bowel sounds are normal. He exhibits no distension and no mass. There is no abdominal tenderness. There is no rebound and no guarding.  Musculoskeletal: Normal range of motion.        General: No tenderness or edema.  Neurological: He is alert and oriented to person, place, and time.  Skin: Skin is warm.  Psychiatric: Affect normal.       LABORATORY DATA:  I have reviewed the data as listed    Component Value Date/Time   NA 141 09/27/2018 1406   NA 137 11/12/2014 1415  K 3.9 09/27/2018 1406   K 4.1 11/12/2014 1415   CL 107 09/27/2018 1406   CL 105 11/12/2014 1415   CO2 26 09/27/2018 1406   CO2 24 11/12/2014 1415   GLUCOSE 124 (H) 09/27/2018 1406   GLUCOSE 98 11/12/2014 1415   BUN 28 (H) 09/27/2018 1406   BUN 24 (H) 11/12/2014 1415   CREATININE 1.00 09/27/2018 1406   CREATININE 1.02 11/12/2014 1415   CALCIUM 9.4 09/27/2018 1406   CALCIUM 9.2 11/12/2014 1415   PROT 7.0 09/27/2018 1406   PROT 7.4 11/12/2014 1415   ALBUMIN 4.3 09/27/2018 1406   ALBUMIN 4.4 11/12/2014 1415   AST 10 (L) 09/27/2018 1406   AST 23 11/12/2014 1415   ALT 10 09/27/2018 1406   ALT 16 (L) 11/12/2014 1415   ALKPHOS 62 09/27/2018 1406   ALKPHOS 103 11/12/2014 1415   BILITOT 0.6 09/27/2018 1406   BILITOT 0.4 11/12/2014 1415   GFRNONAA >60 09/27/2018 1406   GFRNONAA >60 11/12/2014 1415   GFRAA >60 09/27/2018 1406   GFRAA >60 11/12/2014 1415    No results found for: SPEP, UPEP  Lab  Results  Component Value Date   WBC 4.8 09/27/2018   NEUTROABS 2.8 09/27/2018   HGB 12.1 (L) 09/27/2018   HCT 36.2 (L) 09/27/2018   MCV 99.5 09/27/2018   PLT 192 09/27/2018      Chemistry      Component Value Date/Time   NA 141 09/27/2018 1406   NA 137 11/12/2014 1415   K 3.9 09/27/2018 1406   K 4.1 11/12/2014 1415   CL 107 09/27/2018 1406   CL 105 11/12/2014 1415   CO2 26 09/27/2018 1406   CO2 24 11/12/2014 1415   BUN 28 (H) 09/27/2018 1406   BUN 24 (H) 11/12/2014 1415   CREATININE 1.00 09/27/2018 1406   CREATININE 1.02 11/12/2014 1415      Component Value Date/Time   CALCIUM 9.4 09/27/2018 1406   CALCIUM 9.2 11/12/2014 1415   ALKPHOS 62 09/27/2018 1406   ALKPHOS 103 11/12/2014 1415   AST 10 (L) 09/27/2018 1406   AST 23 11/12/2014 1415   ALT 10 09/27/2018 1406   ALT 16 (L) 11/12/2014 1415   BILITOT 0.6 09/27/2018 1406   BILITOT 0.4 11/12/2014 1415       RADIOGRAPHIC STUDIES: I have personally reviewed the radiological images as listed and agreed with the findings in the report. No results found.   ASSESSMENT & PLAN:  Prostate cancer metastatic to multiple sites [facility]) # Castrate resistant prostate cancer-stage IV; on Trelstar q 49M [09/27/2018]; Currently on Xtandi- MRI Dec 2019 -T12 kyphoplasty stable L2-3 epidural-stable status post radiation; March 2020-PSA 4/improving-~4-5.  # Overall clinically  STABLE.  Continue Xtandi at this time.  We will plan to proceed with a CT scan chest and pelvis prior to next visit.  #Bone mets/pain second malignancy--stable.. OTC ibuprofen.  X-geva q 4 weeks.   # DISPOSITION: # Trelstar/ X-geva today.  # Follow up in 5 weeks-MD/ labs- cbc/cmp/PSA/x-geva; CT C/A/ P-Dr.B   Orders Placed This Encounter  Procedures  . CT CHEST W CONTRAST    Standing Status:   Future    Standing Expiration Date:   09/27/2019    Order Specific Question:   If indicated for the ordered procedure, I authorize the administration of contrast media per  Radiology protocol    Answer:   Yes    Order Specific Question:   Preferred imaging location?    Answer:  [facility]    Order Specific Question:   Radiology Contrast Protocol - do NOT remove file path    Answer:   \\[email or web address]    Order Specific Question:   ** REASON FOR EXAM (FREE TEXT)    Answer:   prostate cancer  . CT ABDOMEN PELVIS W CONTRAST    Standing Status:   Future    Standing Expiration Date:   09/27/2019    Order Specific Question:   If indicated for the ordered procedure, I authorize the administration of contrast media per Radiology protocol    Answer:   Yes    Order Specific Question:   Preferred imaging location?    Answer:   [facility]    Order Specific Question:   Radiology Contrast Protocol - do NOT remove file path    Answer:   \\[email or web address]    Order Specific Question:   ** REASON FOR EXAM (FREE TEXT)    Answer:   prostate cancer   All questions were answered. The patient knows to call the clinic with any problems, questions or concerns.      Cammie Sickle, MD 09/28/2018 8:47 AM

## 2018-09-30 ENCOUNTER — Other Ambulatory Visit: Payer: Self-pay | Admitting: Internal Medicine

## 2018-09-30 DIAGNOSIS — C61 Malignant neoplasm of prostate: Secondary | ICD-10-CM

## 2018-09-30 NOTE — Telephone Encounter (Signed)
CBC with Differential/Platelet  Order: [id number]  Status:  Final result  Visible to patient:  Yes (MyChart)  Next appt:  10/28/2018 at 10:00 AM in Radiology ([facility])  Dx:  Prostate cancer metastatic to multipl...   Ref Range & Units 3d ago (09/27/18) 17mo ago (08/29/18) 71mo ago (07/29/18) 22mo ago (07/01/18) 51mo ago (06/03/18) 24mo ago (05/06/18) 18mo ago (03/20/18)  WBC 4.0 - 10.5 K/uL 4.8  4.0  3.7Low   4.6  5.2  3.7Low   5.0 R  RBC 4.22 - 5.81 MIL/uL 3.64Low   3.73Low   3.75Low   3.53Low   3.64Low   3.58Low   4.05Low  R  Hemoglobin 13.0 - 17.0 g/dL 12.1Low   12.3Low   12.3Low   11.4Low   12.1Low   11.9Low   13.7 R  HCT 39.0 - 52.0 % 36.2Low   37.6Low   37.7Low   35.9Low   36.5Low   35.2Low   40.0 R  MCV 80.0 - 100.0 fL 99.5  100.8High   100.5High   101.7High   100.3High   98.3  98.8   MCH 26.0 - 34.0 pg 33.2  33.0  32.8  32.3  33.2  33.2  33.8   MCHC 30.0 - 36.0 g/dL 33.4  32.7  32.6  31.8  33.2  33.8  34.2 R  RDW 11.5 - 15.5 % 13.2  13.8  13.2  13.3  13.4  13.7  13.4 R  Platelets 150 - 400 K/uL 192  196  200  186  214  200  219 R  nRBC 0.0 - 0.2 % 0.0  0.0  0.0  0.0  0.0  0.0    Neutrophils Relative % % 60  54  49  57  66  60  82   Neutro Abs 1.7 - 7.7 K/uL 2.8  2.2  1.8  2.7  3.4  2.2  4.1 R  Lymphocytes Relative % 22  26  29  19  16  17  11    Lymphs Abs 0.7 - 4.0 K/uL 1.0  1.1  1.1  0.9  0.8  0.6Low   0.5Low  R  Monocytes Relative % 10  12  13  13  11  15  6    Monocytes Absolute 0.1 - 1.0 K/uL 0.5  0.5  0.5  0.6  0.6  0.6  0.3 R  Eosinophils Relative % 8  7  8  10  7  6  1    Eosinophils Absolute 0.0 - 0.5 K/uL 0.4  0.3  0.3  0.5  0.4  0.2  0.1 R  Basophils Relative % 0  1  1  1   0  1  0   Basophils Absolute 0.0 - 0.1 K/uL 0.0  0.0  0.0  0.0  0.0  0.0  0.0 R, CM  Immature Granulocytes % 0  0  0  0  0  1    Abs Immature Granulocytes 0.00 - 0.07 K/uL 0.01  0.01 CM 0.01 CM 0.02 CM 0.02 CM 0.02 CM   Comment: Performed at [facility], [address] [place], [place] [address]   Resulting Agency  [facility] CL[facility] LAB [place] [facility] [place] [facility] [place] [facility] [place] [facility] [place] [facility]      Specimen Collected: 09/27/18 14:06  Last Resulted: 09/27/18 14:18     Lab Flowsheet    Order Details    View Encounter    Lab and  Collection Details    Routing    Result History      CM=Additional commentsR=Reference range differs from displayed range      Other Results from 09/27/2018   Result Notes for PSA   Notes recorded by Cammie Sickle, MD on 09/28/2018 at 8:37 AM EDT William Wright- Please inform pt of results- GB  Contains abnormal data PSA  Order: [id number]   Status:  Final result  Visible to patient:  Yes (MyChart)  Next appt:  10/28/2018 at 10:00 AM in Radiology ([facility])  Dx:  Prostate cancer metastatic to multipl...   Ref Range & Units 3d ago (09/27/18) 27mo ago (08/29/18) 58mo ago (07/29/18) 50mo ago (07/01/18) 46mo ago (05/06/18) 9mo ago (03/20/18) 46mo ago (03/11/18)  Prostatic Specific Antigen 0.00 - 4.00 ng/mL 4.94High   5.13High  CM 4.13High  CM 4.28High  CM 6.37High  CM 18.39High  CM 22.73High  CM  Comment: (NOTE)  While PSA levels of <=4.0 ng/ml are reported as reference range, some  men with levels below 4.0 ng/ml can have prostate cancer and many men  with PSA above 4.0 ng/ml do not have prostate cancer. Other tests  such as free PSA, age specific reference ranges, PSA velocity and PSA  doubling time may be helpful especially in men less than 66 years  old.  Performed at [facility], [place] [address], [place], [place]  [address]   Resulting Agency  [place] [facility] [place] [facility] [place] [facility] [place] [facility] [place] [facility] [place] [facility] [place] [facility]      Specimen Collected: 09/27/18 14:06  Last Resulted: 09/27/18 20:09     Lab Flowsheet    Order Details    View Encounter    Lab and Collection Details    Routing    Result History      CM=Additional comments        Result Notes for Comprehensive metabolic panel   Notes recorded by  Cammie Sickle, MD on 09/28/2018 at 8:37 AM EDT William Wright- Please inform pt of results- GB  Contains abnormal data Comprehensive metabolic panel  Order: [id number]   Status:  Final result  Visible to patient:  Yes (MyChart)  Next appt:  10/28/2018 at 10:00 AM in Radiology ([facility])  Dx:  Prostate cancer metastatic to multipl...   Ref Range & Units 3d ago (09/27/18) 21mo ago (08/29/18) 53mo ago (07/29/18) 105mo ago (07/01/18) 34mo ago (06/03/18) 7mo ago (05/06/18) 48mo ago (03/20/18)  Sodium 135 - 145 mmol/L 141  139  141  138  139  137  135   Potassium 3.5 - 5.1 mmol/L 3.9  4.1  4.2  4.0  4.2  4.2  4.4   Chloride 98 - 111 mmol/L 107  105  107  106  107  105  101   CO2 22 - 32 mmol/L 26  27  26  25  25  26  26    Glucose, Bld 70 - 99 mg/dL 124High   103High   101High   102High   105High   112High   114High    BUN 8 - 23 mg/dL 28High   21  20  23  19  20  22    Creatinine, Ser 0.61 - 1.24 mg/dL 1.00  0.89  0.95  0.84  0.91  0.84  0.85   Calcium 8.9 - 10.3 mg/dL 9.4  9.0  9.2  8.9  9.2  9.1  9.3   Total Protein 6.5 -  8.1 g/dL 7.0  7.0  7.1  6.5  6.8  6.4Low   6.3Low    Albumin 3.5 - 5.0 g/dL 4.3  4.1  4.1  3.8  3.9  3.8  3.9   AST 15 - 41 U/L 10Low   15  16  16  15  15   11Low    ALT 0 - 44 U/L 10  13  10  8  9  8  11    Alkaline Phosphatase 38 - 126 U/L 62  67  59  47  58  45  62   Total Bilirubin 0.3 - 1.2 mg/dL 0.6  0.8  0.5  0.5  0.5  0.8  0.7   GFR calc non Af Amer >60 mL/min >60  >60  >60  >60  >60  >60  >60   GFR calc Af Amer >60 mL/min >60  >60  >60  >60  >60 CM >60 CM >60 CM  Anion gap 5 - 15 8  7  CM 8 CM 7 CM 7 CM 6 CM 8 CM  Comment: Performed at [facility], [address] [place], [place] [address]  Resulting Agency  [facility] CLIN LAB [place] CL[facility] [place] [facility] [place] [facility] [place] [facility] [place] [facility] [place] [facility]      Specimen Collected: 09/27/18 14:06  Last Resulted: 09/27/18 14:30

## 2018-10-03 MED FILL — XTANDI 40 MG CAPSULE: 40 | 30 days supply | Qty: 120 | Fill #0

## 2018-10-15 ENCOUNTER — Telehealth: Payer: Self-pay

## 2018-10-15 NOTE — Telephone Encounter (Signed)
RN placed call to patients home, RN spoke with patient.  Patient agreeable with TELEHEALTH visit 10-17-18 at 1:00 PM.

## 2018-10-17 ENCOUNTER — Other Ambulatory Visit: Payer: Self-pay

## 2018-10-17 ENCOUNTER — Other Ambulatory Visit: Payer: Medicare Other

## 2018-10-17 DIAGNOSIS — Z515 Encounter for palliative care: Secondary | ICD-10-CM

## 2018-10-17 NOTE — Progress Notes (Signed)
COMMUNITY PALLIATIVE CARE SW NOTE  PATIENT NAME: William Wright DOB: [birth year] MRN: [id number]  PRIMARY CARE PROVIDER: System, Provider Not In  RESPONSIBLE PARTY:  Acct ID - Guarantor Home Phone Work Phone Relationship Acct Type  [id number] [name]416 [phone]  Self P/F     [address] [place], [place] [address]     PLAN OF CARE and INTERVENTIONS:             1. GOALS OF CARE/ ADVANCE CARE PLANNING:  Patient wishes to remain at home. Patient has Living will and HCPOA on file.   2. PATIENT/CAREGIVER EDUCATION/ COPING:  TELEHEALTH visit completed with Virgie, RN and patient. Patient is alert, oriented. Patient reports doing "well", independent with ADLs. Patient reports good appetite and explained that with the support of his wife, they are eating a plant-based diet. Patient noted that eliminating diary, meat and sugars, he has noticed a positive change in how he feels overall.  Patient said that he has also been sleeping well, only waking up to urinate.  3. PERSONAL EMERGENCY PLAN:  Patient/family would call 9-1-1 in case of emergency. Due to COVID-19 crisis, patient verbalized that he is remaining at home and washing hands frequently.  4. COMMUNITY RESOURCES COORDINATION/ HEALTH CARE NAVIGATION:  Patient provided detailed medical history to update SW. Patient continues to take oral chemo. Patient has a CAT scan scheduled for 4/13 and a follow-up appointment with oncology on 4/17. Patient familiar with and capable of accessing care team. Denies any concerns at this time. 5. FINANCIAL/LEGAL CONCERNS/INTERVENTIONS:  Denies. No concerns at this time.     SOCIAL HX:  Social History   Tobacco Use  . Smoking status: Never Smoker  . Smokeless tobacco: Never Used  Substance Use Topics  . Alcohol use: No    CODE STATUS:   Code Status: Not on file  ADVANCED DIRECTIVES: Y MOST FORM COMPLETE:  No HOSPICE EDUCATION PROVIDED: N/A  Due to the COVID-19 crisis, this visit was done via  telemedicine from my office and it was initiated and consent by this patient and or family.  Patient gave consent and competent to perform Zoom for TELEHEALTH.   I spent 60 minutes with this patient from 1:00PM - 2:00PM providing consultation and support.   Margaretmary Lombard, LCSW

## 2018-10-17 NOTE — Progress Notes (Signed)
PATIENT NAME: William Wright DOB: 10/18/1950 MRN: [id number]  PRIMARY CARE PROVIDER: System, Provider Not In  RESPONSIBLE PARTY:  Acct ID - Guarantor Home Phone Work Phone Relationship Acct Type  [id number] William [phone]  Self P/F     [address], [place], [place] [address]    PLAN OF CARE and INTERVENTIONS:               1.  GOALS OF CARE/ ADVANCE CARE PLANNING:  Patient wishes to remain at home with his family.               2.  PATIENT/CAREGIVER EDUCATION:  Education on s/s of infection, education on good handwashing.  Education on s/s of infection.                3.Marland Kitchen  DISEASE STATUS:Patient is a 68 year old with stage IV prostate cancer with mets to bone.  Patient remians at home with wife.     HISTORY OF PRESENT ILLNESS:    CODE STATUS:   Code Status: Not on file  ADVANCED DIRECTIVES: Y MOST FORM: No PPS: 60%   PHYSICAL EXAM:   VITALS: TELEHEALTH visit vital signs not taken  LUNGS: TELEHEALTH visit CARDIAC: TELEHEALTH VISIT EXTREMITIES: none edema SKIN: Skin color, texture, turgor normal. No rashes or lesions  NEURO: negative Alert and oriented x 4  Due to the COVID-19 crisis, this visit was done via telemedicine from my office and it was initiated and consent by this patient and or family.  Patient gave consent and competent to perform Zoom for TELEHEALTH.  SW [employer] met with patient, patient alert and oriented.  Patient pleasant during visit and provides detailed history to SW on history of prostate with mets to bone diagnosis.  Patient's cancer as found when Dr Vira Agar performed routine colonoscopy was performed.  Patient talks at length about dietary changes he and his wife made and how much better he feels and PSA levels have decreased.  Patient to have CAT SCAN 10-28-18 and has scheduled appointment with Dr [name] 11-01-18.  Patient continues to take Tylenol as needed for pain.  Patient remains reluctant to take narcotics.  RN has provided  education to patient that Gabapentin may help with nerve pain.  Patient has RX for Gabapentin but remains reluctant to take.  Patient denies having any shortness of breath or cough.  Patient's appetite is good and patient reports he continues to eat a plant based diet and does not eat sugars or meats.  Patient reports he does not eat dairy products.  Patient reports he has been sleeping well (4-5 hours continuous).  Patiet reports this is good for him as he usually wakes frequently to urinate.  Patient continues to take oral chemo.  Patient reports he and family have been staying home due to COV19 virus.  Patient denies having any concerns at the present time.  Patient encouraged to contact palliative care with questions or concerns.                     [name], RN

## 2018-10-28 ENCOUNTER — Ambulatory Visit
Admission: RE | Admit: 2018-10-28 | Discharge: 2018-10-28 | Disposition: A | Discharge status: Home / Self Care | Payer: Medicare Other | Source: Ambulatory Visit | Attending: Internal Medicine | Admitting: Internal Medicine

## 2018-10-28 ENCOUNTER — Other Ambulatory Visit: Payer: Self-pay

## 2018-10-28 ENCOUNTER — Ambulatory Visit: Admission: RE | Admit: 2018-10-28 | Payer: Medicare Other | Source: Ambulatory Visit

## 2018-10-28 DIAGNOSIS — C61 Malignant neoplasm of prostate: Secondary | ICD-10-CM

## 2018-10-28 DIAGNOSIS — R911 Solitary pulmonary nodule: Secondary | ICD-10-CM | POA: Diagnosis not present

## 2018-10-28 MED ORDER — IOHEXOL 300 MG/ML  SOLN
85.0000 mL | Freq: Once | INTRAMUSCULAR | Status: AC | PRN
  Administered 2018-10-28: 85 mL via INTRAVENOUS

## 2018-10-28 MED FILL — XTANDI 40 MG CAPSULE: 40 | 30 days supply | Qty: 120 | Fill #1

## 2018-11-01 ENCOUNTER — Inpatient Hospital Stay (HOSPITAL_BASED_OUTPATIENT_CLINIC_OR_DEPARTMENT_OTHER): Payer: Medicare Other | Admitting: Internal Medicine

## 2018-11-01 ENCOUNTER — Other Ambulatory Visit: Payer: Self-pay

## 2018-11-01 ENCOUNTER — Inpatient Hospital Stay: Payer: Medicare Other | Attending: Internal Medicine

## 2018-11-01 ENCOUNTER — Telehealth: Payer: Self-pay | Admitting: *Deleted

## 2018-11-01 ENCOUNTER — Inpatient Hospital Stay: Payer: Medicare Other

## 2018-11-01 VITALS — BP 113/73 | HR 57 | Temp 95.4°F | Resp 20 | Ht 72.0 in | Wt 163.8 lb

## 2018-11-01 DIAGNOSIS — Z806 Family history of leukemia: Secondary | ICD-10-CM

## 2018-11-01 DIAGNOSIS — Z923 Personal history of irradiation: Secondary | ICD-10-CM | POA: Diagnosis not present

## 2018-11-01 DIAGNOSIS — C7951 Secondary malignant neoplasm of bone: Secondary | ICD-10-CM | POA: Diagnosis not present

## 2018-11-01 DIAGNOSIS — M549 Dorsalgia, unspecified: Secondary | ICD-10-CM | POA: Diagnosis not present

## 2018-11-01 DIAGNOSIS — R911 Solitary pulmonary nodule: Secondary | ICD-10-CM | POA: Diagnosis not present

## 2018-11-01 DIAGNOSIS — C61 Malignant neoplasm of prostate: Secondary | ICD-10-CM | POA: Diagnosis not present

## 2018-11-01 DIAGNOSIS — Z79899 Other long term (current) drug therapy: Secondary | ICD-10-CM | POA: Diagnosis not present

## 2018-11-01 DIAGNOSIS — R5383 Other fatigue: Secondary | ICD-10-CM | POA: Insufficient documentation

## 2018-11-01 DIAGNOSIS — Z79818 Long term (current) use of other agents affecting estrogen receptors and estrogen levels: Secondary | ICD-10-CM

## 2018-11-01 DIAGNOSIS — M25562 Pain in left knee: Secondary | ICD-10-CM | POA: Insufficient documentation

## 2018-11-01 DIAGNOSIS — R5381 Other malaise: Secondary | ICD-10-CM | POA: Diagnosis not present

## 2018-11-01 DIAGNOSIS — Z801 Family history of malignant neoplasm of trachea, bronchus and lung: Secondary | ICD-10-CM | POA: Insufficient documentation

## 2018-11-01 LAB — COMPREHENSIVE METABOLIC PANEL
ALT: 9 U/L (ref 0–44)
AST: 12 U/L — ABNORMAL LOW (ref 15–41)
Albumin: 4.3 g/dL (ref 3.5–5.0)
Alkaline Phosphatase: 58 U/L (ref 38–126)
Anion gap: 6 (ref 5–15)
BUN: 24 mg/dL — ABNORMAL HIGH (ref 8–23)
CO2: 27 mmol/L (ref 22–32)
Calcium: 9.4 mg/dL (ref 8.9–10.3)
Chloride: 106 mmol/L (ref 98–111)
Creatinine, Ser: 1.08 mg/dL (ref 0.61–1.24)
GFR calc Af Amer: >60 mL/min (ref >60)
GFR calc non Af Amer: >60 mL/min (ref >60)
Glucose, Bld: 94 mg/dL (ref 70–99)
Potassium: 3.9 mmol/L (ref 3.5–5.1)
Sodium: 139 mmol/L (ref 135–145)
Total Bilirubin: 0.8 mg/dL (ref 0.3–1.2)
Total Protein: 7.1 g/dL (ref 6.5–8.1)

## 2018-11-01 LAB — CBC WITH DIFFERENTIAL/PLATELET
Abs Immature Granulocytes: 0.01 10*3/uL (ref 0.00–0.07)
Basophils Absolute: 0 10*3/uL (ref 0.0–0.1)
Basophils Relative: 1 %
Eosinophils Absolute: 0.5 10*3/uL (ref 0.0–0.5)
Eosinophils Relative: 11 %
HCT: 39 % (ref 39.0–52.0)
Hemoglobin: 12.9 g/dL — ABNORMAL LOW (ref 13.0–17.0)
Immature Granulocytes: 0 %
Lymphocytes Relative: 27 %
Lymphs Abs: 1.2 10*3/uL (ref 0.7–4.0)
MCH: 32.6 pg (ref 26.0–34.0)
MCHC: 33.1 g/dL (ref 30.0–36.0)
MCV: 98.5 fL (ref 80.0–100.0)
Monocytes Absolute: 0.5 10*3/uL (ref 0.1–1.0)
Monocytes Relative: 11 %
Neutro Abs: 2.1 10*3/uL (ref 1.7–7.7)
Neutrophils Relative %: 50 %
Platelets: 187 10*3/uL (ref 150–400)
RBC: 3.96 MIL/uL — ABNORMAL LOW (ref 4.22–5.81)
RDW: 13.2 % (ref 11.5–15.5)
WBC: 4.3 10*3/uL (ref 4.0–10.5)
nRBC: 0 % (ref 0.0–0.2)

## 2018-11-01 LAB — PSA: Prostatic Specific Antigen: 8.48 ng/mL — ABNORMAL HIGH (ref 0.00–4.00)

## 2018-11-01 MED ORDER — DENOSUMAB 120 MG/1.7ML ~~LOC~~ SOLN
120.0000 mg | Freq: Once | SUBCUTANEOUS | Status: AC
  Administered 2018-11-01: 120 mg via SUBCUTANEOUS
  Filled 2018-11-01: qty 1.7

## 2018-11-01 NOTE — Progress Notes (Signed)
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   # MARCH 2019- Taken off trial [ based on interm analysis]; continue X-tandi [given not needing steroids]  # July 2019- CT/bone scan stable; MRI lumbar spine- epidural extension/L3 spinal nerves involvement; on RT [finish sep 12th]  # vertebroplasty [[facility]ke] on sep 13th 2019   # RLL [~25m lung nodule] s/p Bx- Cryptococcus- ID/Dr.Fitzgerald- on diflucan   # Xgeva  # MOLECULAR TESTING/omniseq- BRCA-2 MUTATED; PDL-1- NEG; MSS; N-TRK-NEG  # GENETICS- BRCA-NEG; hetrozygous MUTHY; recommended family work up --------------------------------------------------    DIAGNOSIS: '[ ]'  Metastatic prostate cancer  STAGE: 4    ;GOALS: Palliative  CURRENT/MOST RECENT THERAPY-X-tandi+ Trelstar      Cancer of prostate ([place]   08/17/2012 Initial Diagnosis    Cancer of prostate, stage II    12/03/2013 Progression       10/13/2014 Progression        Prostate cancer metastatic to multiple sites ([facility]      INTERVAL HISTORY:  William HAMMAN89y.[id number].  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  Patient states his back pain is improved.  However complains of pain in his  left lower extremity above the knee.  No pain worsening with movement.  Denies any headaches.  Denies any nausea vomiting.    Review of Systems  Constitutional: Positive for malaise/fatigue. Negative for chills, diaphoresis and fever.  HENT: Negative for nosebleeds and sore throat.   Eyes: Negative for double vision.  Respiratory: Negative for cough, hemoptysis, sputum production, shortness of breath and wheezing.   Cardiovascular: Negative for chest pain, palpitations, orthopnea and leg swelling.  Gastrointestinal: Negative for abdominal pain, blood in stool, constipation, diarrhea, heartburn, melena, nausea and vomiting.  Genitourinary: Negative for dysuria, frequency and urgency.  Musculoskeletal: Positive for back pain and joint pain.  Skin: Negative.  Negative for itching and rash.  Neurological: Negative for dizziness, focal weakness, weakness and headaches.  Endo/Heme/Allergies: Does not bruise/bleed easily.  Psychiatric/Behavioral: Negative for depression. The patient does not have insomnia.       PAST MEDICAL HISTORY :  Past Medical History:  Diagnosis Date  . Cancer of prostate ([place] 07/2012   OraL chemo pill.   . Family history of leukemia     PAST SURGICAL HISTORY :   Past Surgical History:  Procedure Laterality Date  . EXTRACORPOREAL SHOCK WAVE LITHOTRIPSY Left 05/20/2015   Procedure: EXTRACORPOREAL SHOCK WAVE LITHOTRIPSY (ESWL);  Surgeon: MRoyston Cowper MD;  Location: [facility] ORS;  Service: Urology;  Laterality: Left;  . EXTRACORPOREAL SHOCK WAVE LITHOTRIPSY Right 12/21/2016   Procedure: EXTRACORPOREAL SHOCK WAVE LITHOTRIPSY (ESWL);  Surgeon: WRoyston Cowper MD;  Location: [facility] ORS;  Service: Urology;  Laterality: Right;  . IR RADIOLOGIST EVAL & MGMT  02/28/2018  FAMILY HISTORY :   Family History  Problem Relation Age of Onset  . Alzheimer's disease Father   . Lung cancer Maternal Grandfather 70  . Leukemia Paternal Grandmother 69  . COPD Paternal Grandfather    . Cancer Maternal Uncle        type unk, dx 80's    SOCIAL HISTORY:   Social History   Tobacco Use  . Smoking status: Never Smoker  . Smokeless tobacco: Never Used  Substance Use Topics  . Alcohol use: No  . Drug use: No    ALLERGIES:  is allergic to no known allergies.  MEDICATIONS:  Current Outpatient Medications  Medication Sig Dispense Refill  . gabapentin (NEURONTIN) 100 MG capsule Take 100 mg by mouth 3 (three) times daily.    Marland Kitchen HYDROcodone-acetaminophen (NORCO/VICODIN) 5-325 MG tablet Take 1 tablet by mouth every 6 (six) hours as needed for moderate pain.    Marland Kitchen ondansetron (ZOFRAN) 8 MG tablet Take 1 tablet (8 mg total) by mouth every 8 (eight) hours as needed for nausea or vomiting. 20 tablet 0  . oxybutynin (DITROPAN-XL) 5 MG 24 hr tablet Take 1 tablet (5 mg total) by mouth at bedtime. 90 tablet 3  . promethazine (PHENERGAN) 25 MG tablet Take 1 tablet (25 mg total) by mouth every 6 (six) hours as needed for nausea or vomiting. 30 tablet 0  . tamsulosin (FLOMAX) 0.4 MG CAPS capsule Take 1 capsule (0.4 mg total) by mouth daily. 30 capsule 11  . traMADol (ULTRAM) 50 MG tablet Take 1 tablet (50 mg total) by mouth every 8 (eight) hours as needed. 45 tablet 1  . Triptorelin Pamoate (TRELSTAR) 22.5 MG injection Inject 22.5 mg into the muscle every 6 (six) months.    . valACYclovir (VALTREX) 1000 MG tablet Take 1 tablet (1,000 mg total) by mouth 2 (two) times daily. 60 tablet 6  . XTANDI 40 MG capsule TAKE 4 CAPSULES (160 MG TOTAL) BY MOUTH DAILY. 120 capsule 2   No current facility-administered medications for this visit.     PHYSICAL EXAMINATION: ECOG PERFORMANCE STATUS: 1 - Symptomatic but completely ambulatory  BP 113/73   Pulse (!) 57   Temp (!) 95.4 F (35.2 C) (Tympanic)   Resp 20   Ht 6' (1.829 m)   Wt 163 lb 12.8 oz (74.3 kg)   BMI 22.22 kg/m   Filed Weights   11/01/18 0930  Weight: 163 lb 12.8 oz (74.3 kg)    Physical Exam  Constitutional: He is  oriented to person, place, and time and well-developed, well-nourished, and in no distress.  Alone.  Walking by himself.  HENT:  Head: Normocephalic and atraumatic.  Mouth/Throat: Oropharynx is clear and moist. No oropharyngeal exudate.  Eyes: Pupils are equal, round, and reactive to light.  Neck: Normal range of motion. Neck supple.  Cardiovascular: Normal rate and regular rhythm.  Pulmonary/Chest: No respiratory distress. He has no wheezes.  Abdominal: Soft. Bowel sounds are normal. He exhibits no distension and no mass. There is no abdominal tenderness. There is no rebound and no guarding.  Musculoskeletal: Normal range of motion.        General: No tenderness or edema.  Neurological: He is alert and oriented to person, place, and time.  Skin: Skin is warm.  Psychiatric: Affect normal.       LABORATORY DATA:  I have reviewed the data as listed    Component Value Date/Time   NA 139 11/01/2018 0904   NA 137 11/12/2014 1415  K 3.9 11/01/2018 0904   K 4.1 11/12/2014 1415   CL 106 11/01/2018 0904   CL 105 11/12/2014 1415   CO2 27 11/01/2018 0904   CO2 24 11/12/2014 1415   GLUCOSE 94 11/01/2018 0904   GLUCOSE 98 11/12/2014 1415   BUN 24 (H) 11/01/2018 0904   BUN 24 (H) 11/12/2014 1415   CREATININE 1.08 11/01/2018 0904   CREATININE 1.02 11/12/2014 1415   CALCIUM 9.4 11/01/2018 0904   CALCIUM 9.2 11/12/2014 1415   PROT 7.1 11/01/2018 0904   PROT 7.4 11/12/2014 1415   ALBUMIN 4.3 11/01/2018 0904   ALBUMIN 4.4 11/12/2014 1415   AST 12 (L) 11/01/2018 0904   AST 23 11/12/2014 1415   ALT 9 11/01/2018 0904   ALT 16 (L) 11/12/2014 1415   ALKPHOS 58 11/01/2018 0904   ALKPHOS 103 11/12/2014 1415   BILITOT 0.8 11/01/2018 0904   BILITOT 0.4 11/12/2014 1415   GFRNONAA >60 11/01/2018 0904   GFRNONAA >60 11/12/2014 1415   GFRAA >60 11/01/2018 0904   GFRAA >60 11/12/2014 1415    No results found for: SPEP, UPEP  Lab Results  Component Value Date   WBC 4.3 11/01/2018    NEUTROABS 2.1 11/01/2018   HGB 12.9 (L) 11/01/2018   HCT 39.0 11/01/2018   MCV 98.5 11/01/2018   PLT 187 11/01/2018      Chemistry      Component Value Date/Time   NA 139 11/01/2018 0904   NA 137 11/12/2014 1415   K 3.9 11/01/2018 0904   K 4.1 11/12/2014 1415   CL 106 11/01/2018 0904   CL 105 11/12/2014 1415   CO2 27 11/01/2018 0904   CO2 24 11/12/2014 1415   BUN 24 (H) 11/01/2018 0904   BUN 24 (H) 11/12/2014 1415   CREATININE 1.08 11/01/2018 0904   CREATININE 1.02 11/12/2014 1415      Component Value Date/Time   CALCIUM 9.4 11/01/2018 0904   CALCIUM 9.2 11/12/2014 1415   ALKPHOS 58 11/01/2018 0904   ALKPHOS 103 11/12/2014 1415   AST 12 (L) 11/01/2018 0904   AST 23 11/12/2014 1415   ALT 9 11/01/2018 0904   ALT 16 (L) 11/12/2014 1415   BILITOT 0.8 11/01/2018 0904   BILITOT 0.4 11/12/2014 1415       RADIOGRAPHIC STUDIES: I have personally reviewed the radiological images as listed and agreed with the findings in the report. No results found.   ASSESSMENT & PLAN:  Prostate cancer metastatic to multiple sites [facility]) # Castrate resistant prostate cancer-stage IV; on Trelstar q 42M [09/27/2018]; Currently on Xtandi- CT April 2020- STABLE- lung nodule; 12 kyphoplasty stable L2-3 epidural-stable status post radiation; March 2020-PSA 4/clinically stable between 4-5.  # Overall clinically  STABLE.  Continue Xtandi at this time.    #Bone mets/pain second malignancy--stable; but worsening Left above knee pain-discussed regarding bone scan/however hold off given the current epidemic; trial of conservative measures NSAIDs and knee brace./X-geva q 4 weeks.   #Right lower lung nodule-fungal infection status post Diflucan improved.  # DISPOSITION: # X-geva today.  # Follow up in 4 weeks-MD/ labs- cbc/cmp/PSA/x-geva-Dr.B   No orders of the defined types were placed in this encounter.  All questions were answered. The patient knows to call the clinic with any problems, questions  or concerns.      Cammie Sickle, MD 11/01/2018 11:01 AM

## 2018-11-01 NOTE — Telephone Encounter (Signed)
Patient contacted for results. Left detailed msg.

## 2018-11-01 NOTE — Assessment & Plan Note (Addendum)
#   Castrate resistant prostate cancer-stage IV; on Trelstar q 71M [09/27/2018]; Currently on Xtandi- CT April 2020- STABLE- lung nodule; 12 kyphoplasty stable L2-3 epidural-stable status post radiation; March 2020-PSA 4/clinically stable between 4-5.  # Overall clinically  STABLE.  Continue Xtandi at this time.    #Bone mets/pain second malignancy--stable; but worsening Left above knee pain-discussed regarding bone scan/however hold off given the current epidemic; trial of conservative measures NSAIDs and knee brace./X-geva q 4 weeks.   #Right lower lung nodule-fungal infection status post Diflucan improved.  # DISPOSITION: # X-geva today.  # Follow up in 4 weeks-MD/ labs- cbc/cmp/PSA/x-geva-Dr.B

## 2018-11-01 NOTE — Telephone Encounter (Signed)
-----   Message from Cammie Sickle, MD sent at 11/01/2018  3:29 PM EDT ----- Please inform patient of his PSA at 8.4.  No new recommendations follow-up as planned

## 2018-11-18 ENCOUNTER — Telehealth: Payer: Self-pay

## 2018-11-18 NOTE — Telephone Encounter (Signed)
Telephone call to patient to schedule TELEHEALTH VISIT.  Patient in agreement with visit on 11-20-18 at 10:00 AM.

## 2018-11-20 ENCOUNTER — Other Ambulatory Visit: Payer: Medicare Other

## 2018-11-20 ENCOUNTER — Other Ambulatory Visit: Payer: Self-pay

## 2018-11-20 DIAGNOSIS — Z515 Encounter for palliative care: Secondary | ICD-10-CM

## 2018-11-20 NOTE — Progress Notes (Signed)
PATIENT NAME: William Wright DOB: May 08, 1951 MRN: [id number]  PRIMARY CARE PROVIDER: System, Provider Not In  RESPONSIBLE PARTY:  Acct ID - Guarantor Home Phone Work Phone Relationship Acct Type  [id number] [name]714 [phone]  Self P/F     [address], [place], [place] [address]    PLAN OF CARE and INTERVENTIONS:               1.  GOALS OF CARE/ ADVANCE CARE PLANNING:  Remain at home with wife.                2.  PATIENT/CAREGIVER EDUCATION:  Education on pain management, education on s/s of infection, fever, chills, cough, shortness of breath, fatigue.                 3.  DISEASE STATUS: Patient is a 68 year old patient with diagnosis of prostate cancer.  Patient continues to remain at home with wife.  SW and RN met with patient for TELEHEALTH visit via Zoom.   Due to the COVID-19 crisis, this visit was done via telemedicine from my office and it was initiated and consent by this patient and or family. Patient reports having increased pain in left leg and hip.  Patient reports pain is worse in the mornings.  Patient currently rates pain at 5 on pain scale.  Patient remains reluctant to take narcotics and takes 400 mg of Ibuprofen in the AM and most of the time in the night.  Patient states is able to do what he wants and mows his own yard.  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  Patient reports he continues to eat a plant based diet.  Patient to see Dr [name] 11/29/18.  Patient reports he has been sleeping well. Patient denies having any falls.  Patient remains pleasant and upbeat.  Patient reports he has had no medication changes.  Patient encouraged to call with questions or concerns.          HISTORY OF PRESENT ILLNESS:    CODE STATUS: Unknown  ADVANCED DIRECTIVES: Unknown MOST FORM: No PPS: 60%   PHYSICAL EXAM:   VITALS:No vital signs taken B/P from last MD visit.  TELEHEALTH visit  LUNGS: denies cough or shortness of breath CARDIAC:   EXTREMITIES: no edema SKIN: Skin color, texture, turgor normal. No rashes or lesions  NEURO: Alert and oriented x 4       [name], RN

## 2018-11-20 NOTE — Progress Notes (Signed)
COMMUNITY PALLIATIVE CARE SW NOTE  PATIENT NAME: William Wright DOB: 12-29-1950 MRN: [id number]  PRIMARY CARE PROVIDER: System, Provider Not In  RESPONSIBLE PARTY:  Acct ID - Guarantor Home Phone Work Phone Relationship Acct Type  [id number] [name]  Self P/F     [address] [place],  [address]     PLAN OF CARE and INTERVENTIONS:             1. GOALS OF CARE/ ADVANCE CARE PLANNING:  Patient wishes to remain at home. Patient has Living will and HCPOA on file.  2. SOCIAL/EMOTIONAL/SPIRITUAL ASSESSMENT/ INTERVENTIONS:  SW and RN met with patient via TELEHEALTH. Patient reports doing "good" overall. Patient continues to do well with appetite and diet. Patient said he is sleeping well, getting 4-5 hour stretches. 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  3. PATIENT/CAREGIVER EDUCATION/ COPING:  Patient is coping well, maintains positive outlook.  4. PERSONAL EMERGENCY PLAN:  Patient/family would call 9-1-1 in case of emergency. Due to COVID-19 crisis, patient verbalized that he is remaining at home and washing hands frequently.  5. COMMUNITY RESOURCES COORDINATION/ HEALTH CARE NAVIGATION:  None. 6. FINANCIAL/LEGAL CONCERNS/INTERVENTIONS:  None.     SOCIAL HX:  Social History   Tobacco Use  . Smoking status: Never Smoker  . Smokeless tobacco: Never Used  Substance Use Topics  . Alcohol use: No    CODE STATUS:   Code Status: Not on file  ADVANCED DIRECTIVES: Y MOST FORM COMPLETE:  No. HOSPICE EDUCATION PROVIDED: None.  PPS: Patient is independent of ADLs.   Due to the COVID-19 crisis, this visit was done via Zoom from my office and it was initiated and consent by this patient and or family.This was a scheduled visit.   I spent 30 minutes with this patient from 10:00-10:30a providing  consultation and support.   Margaretmary Lombard, LCSW

## 2018-11-22 MED FILL — XTANDI 40 MG CAPSULE: 40 | 30 days supply | Qty: 120 | Fill #2

## 2018-11-29 ENCOUNTER — Inpatient Hospital Stay (HOSPITAL_BASED_OUTPATIENT_CLINIC_OR_DEPARTMENT_OTHER): Payer: Medicare Other | Admitting: Internal Medicine

## 2018-11-29 ENCOUNTER — Other Ambulatory Visit: Payer: Self-pay | Admitting: *Deleted

## 2018-11-29 ENCOUNTER — Other Ambulatory Visit: Payer: Self-pay

## 2018-11-29 ENCOUNTER — Inpatient Hospital Stay: Payer: Medicare Other

## 2018-11-29 ENCOUNTER — Inpatient Hospital Stay: Payer: Medicare Other | Attending: Internal Medicine

## 2018-11-29 VITALS — BP 128/80 | HR 60 | Temp 95.7°F | Resp 20 | Ht 72.0 in | Wt 161.0 lb

## 2018-11-29 DIAGNOSIS — M549 Dorsalgia, unspecified: Secondary | ICD-10-CM | POA: Insufficient documentation

## 2018-11-29 DIAGNOSIS — Z79818 Long term (current) use of other agents affecting estrogen receptors and estrogen levels: Secondary | ICD-10-CM

## 2018-11-29 DIAGNOSIS — R5383 Other fatigue: Secondary | ICD-10-CM | POA: Insufficient documentation

## 2018-11-29 DIAGNOSIS — C61 Malignant neoplasm of prostate: Secondary | ICD-10-CM

## 2018-11-29 DIAGNOSIS — Z806 Family history of leukemia: Secondary | ICD-10-CM | POA: Diagnosis not present

## 2018-11-29 DIAGNOSIS — R5381 Other malaise: Secondary | ICD-10-CM | POA: Insufficient documentation

## 2018-11-29 DIAGNOSIS — R911 Solitary pulmonary nodule: Secondary | ICD-10-CM | POA: Diagnosis not present

## 2018-11-29 DIAGNOSIS — M79652 Pain in left thigh: Secondary | ICD-10-CM | POA: Diagnosis not present

## 2018-11-29 DIAGNOSIS — Z79899 Other long term (current) drug therapy: Secondary | ICD-10-CM

## 2018-11-29 DIAGNOSIS — Z801 Family history of malignant neoplasm of trachea, bronchus and lung: Secondary | ICD-10-CM

## 2018-11-29 DIAGNOSIS — C7951 Secondary malignant neoplasm of bone: Secondary | ICD-10-CM | POA: Insufficient documentation

## 2018-11-29 DIAGNOSIS — M5442 Lumbago with sciatica, left side: Secondary | ICD-10-CM

## 2018-11-29 LAB — COMPREHENSIVE METABOLIC PANEL
ALT: 10 U/L (ref 0–44)
AST: 15 U/L (ref 15–41)
Albumin: 4.5 g/dL (ref 3.5–5.0)
Alkaline Phosphatase: 67 U/L (ref 38–126)
Anion gap: 8 (ref 5–15)
BUN: 24 mg/dL — ABNORMAL HIGH (ref 8–23)
CO2: 23 mmol/L (ref 22–32)
Calcium: 9.2 mg/dL (ref 8.9–10.3)
Chloride: 108 mmol/L (ref 98–111)
Creatinine, Ser: 1 mg/dL (ref 0.61–1.24)
GFR calc Af Amer: >60 mL/min (ref >60)
GFR calc non Af Amer: >60 mL/min (ref >60)
Glucose, Bld: 110 mg/dL — ABNORMAL HIGH (ref 70–99)
Potassium: 4.3 mmol/L (ref 3.5–5.1)
Sodium: 139 mmol/L (ref 135–145)
Total Bilirubin: 0.6 mg/dL (ref 0.3–1.2)
Total Protein: 7.2 g/dL (ref 6.5–8.1)

## 2018-11-29 LAB — CBC WITH DIFFERENTIAL/PLATELET
Abs Immature Granulocytes: 0.02 10*3/uL (ref 0.00–0.07)
Basophils Absolute: 0 10*3/uL (ref 0.0–0.1)
Basophils Relative: 1 %
Eosinophils Absolute: 0.4 10*3/uL (ref 0.0–0.5)
Eosinophils Relative: 8 %
HCT: 40 % (ref 39.0–52.0)
Hemoglobin: 13.4 g/dL (ref 13.0–17.0)
Immature Granulocytes: 1 %
Lymphocytes Relative: 27 %
Lymphs Abs: 1.2 10*3/uL (ref 0.7–4.0)
MCH: 32.8 pg (ref 26.0–34.0)
MCHC: 33.5 g/dL (ref 30.0–36.0)
MCV: 98 fL (ref 80.0–100.0)
Monocytes Absolute: 0.5 10*3/uL (ref 0.1–1.0)
Monocytes Relative: 12 %
Neutro Abs: 2.2 10*3/uL (ref 1.7–7.7)
Neutrophils Relative %: 51 %
Platelets: 185 10*3/uL (ref 150–400)
RBC: 4.08 MIL/uL — ABNORMAL LOW (ref 4.22–5.81)
RDW: 13.1 % (ref 11.5–15.5)
WBC: 4.3 10*3/uL (ref 4.0–10.5)
nRBC: 0 % (ref 0.0–0.2)

## 2018-11-29 MED ORDER — DENOSUMAB 120 MG/1.7ML ~~LOC~~ SOLN
120.0000 mg | Freq: Once | SUBCUTANEOUS | Status: AC
  Administered 2018-11-29: 120 mg via SUBCUTANEOUS
  Filled 2018-11-29: qty 1.7

## 2018-11-29 MED ORDER — GABAPENTIN 100 MG PO CAPS: 100.0000 mg | ORAL_CAPSULE | Freq: Three times a day (TID) | ORAL | 3 refills | Status: AC

## 2018-11-29 NOTE — Assessment & Plan Note (Addendum)
#   Castrate resistant prostate cancer-stage IV; on Trelstar q 51M [09/27/2018]; Currently on Xtandi- CT April 2020- STABLE- lung nodule; 12 kyphoplasty stable L2-3 epidural-stable status post radiation;clinically stable; but  April 2020- PSA rising- 8.4  # Overall clinically  STABLE.  Continue Xtandi at this time.    #Bone mets/pain second malignancy--worsening- /X-geva q 4 weeks.  Recommend MRI of the thoracic and lumbar spine ASAP.  Also discussed regarding bone scan if MRI is unrevealing.  # DISPOSITION: # X-geva today.  # MRI thoracic/lumbar spine asap # Follow up in 5 weeks-MD/ labs- cbc/cmp/PSA/x-geva-Dr.B

## 2018-11-29 NOTE — Progress Notes (Signed)
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   # MARCH 2019- Taken off trial [ based on interm analysis]; continue X-tandi [given not needing steroids]  # July 2019- CT/bone scan stable; MRI lumbar spine- epidural extension/L3 spinal nerves involvement; on RT [finish sep 12th]  # vertebroplasty [duke] on sep 13th 2019   # RLL [~2m lung nodule] s/p Bx- Cryptococcus- ID/Dr.Fitzgerald- on diflucan   # Xgeva  # MOLECULAR TESTING/omniseq- BRCA-2 MUTATED; PDL-1- NEG; MSS; N-TRK-NEG  # GENETICS- BRCA-NEG; hetrozygous MUTHY; recommended family work up --------------------------------------------------    DIAGNOSIS: [ ] Metastatic prostate cancer  STAGE: 4    ;GOALS: Palliative  CURRENT/MOST RECENT THERAPY-X-tandi+ Trelstar      [place]   08/17/2012 Initial Diagnosis    Cancer of prostate, stage II    12/03/2013 Progression       10/13/2014 Progression        Prostate cancer metastatic to multiple sites ([facility]      INTERVAL HISTORY:  William LASER89y.o.  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  Patient continues to have pain in his back; which is slightly improved however complains of worsening pain  radiating to his left thigh/knee.  No worsening pain with movement.  No loss of bladder or bowel control.  Denies any headaches.  Denies any nausea vomiting.    Review of Systems  Constitutional: Positive for malaise/fatigue. Negative for chills, diaphoresis and fever.  HENT: Negative for nosebleeds and sore throat.   Eyes: Negative for double vision.  Respiratory: Negative for cough, hemoptysis, sputum production, shortness of breath and wheezing.   Cardiovascular: Negative for chest pain, palpitations, orthopnea and leg swelling.  Gastrointestinal: Negative for abdominal pain, blood in stool, constipation, diarrhea, heartburn, melena, nausea and vomiting.  Genitourinary: Negative for dysuria, frequency and urgency.  Musculoskeletal: Positive for back pain and joint pain.  Skin: Negative.  Negative for itching and rash.  Neurological: Negative for dizziness, focal weakness, weakness and headaches.  Endo/Heme/Allergies: Does not bruise/bleed easily.  Psychiatric/Behavioral: Negative for depression. The patient does not have insomnia.       PAST MEDICAL HISTORY :  Past Medical History:  Diagnosis Date  . Cancer of prostate ([facility] 07/2012   OraL chemo pill.   . Family history of leukemia     PAST SURGICAL HISTORY :   Past Surgical History:  Procedure Laterality Date  . EXTRACORPOREAL SHOCK WAVE LITHOTRIPSY Left 05/20/2015   Procedure: EXTRACORPOREAL SHOCK WAVE LITHOTRIPSY (ESWL);  Surgeon: MRoyston Cowper MD;  Location: [facility] ORS;  Service: Urology;  Laterality: Left;  . EXTRACORPOREAL SHOCK WAVE LITHOTRIPSY Right 12/21/2016   Procedure: EXTRACORPOREAL SHOCK WAVE LITHOTRIPSY (ESWL);  Surgeon: WRoyston Cowper MD;  Location: [facility] ORS;  Service: Urology;  Laterality: Right;  . IR RADIOLOGIST EVAL & MGMT  02/28/2018    FAMILY HISTORY :   Family History  Problem Relation Age of Onset  . Alzheimer's disease Father   . Lung cancer Maternal Grandfather 59  . Leukemia Paternal  Grandmother 81  . COPD Paternal Grandfather   . Cancer Maternal Uncle        type unk, dx 80's    SOCIAL HISTORY:   Social History   Tobacco Use  . Smoking status: Never Smoker  . Smokeless tobacco: Never Used  Substance Use Topics  . Alcohol use: No  . Drug use: No    ALLERGIES:  is allergic to no known allergies.  MEDICATIONS:  Current Outpatient Medications  Medication Sig Dispense Refill  . gabapentin (NEURONTIN) 100 MG capsule Take 100 mg by mouth 3 (three) times daily.    Marland Kitchen oxybutynin (DITROPAN-XL) 5 MG 24 hr tablet Take 1 tablet (5 mg total) by mouth at bedtime. 90 tablet 3  . tamsulosin (FLOMAX) 0.4 MG CAPS capsule Take 1 capsule (0.4 mg total) by mouth daily. 30 capsule 11  . Triptorelin Pamoate (TRELSTAR) 22.5 MG injection Inject 22.5 mg into the muscle every 6 (six) months.    Gillermina Phy 40 MG capsule TAKE 4 CAPSULES (160 MG TOTAL) BY MOUTH DAILY. 120 capsule 2  . HYDROcodone-acetaminophen (NORCO/VICODIN) 5-325 MG tablet Take 1 tablet by mouth every 6 (six) hours as needed for moderate pain.    Marland Kitchen ondansetron (ZOFRAN) 8 MG tablet Take 1 tablet (8 mg total) by mouth every 8 (eight) hours as needed for nausea or vomiting. (Patient not taking: Reported on 11/29/2018) 20 tablet 0  . promethazine (PHENERGAN) 25 MG tablet Take 1 tablet (25 mg total) by mouth every 6 (six) hours as needed for nausea or vomiting. (Patient not taking: Reported on 11/29/2018) 30 tablet 0  . traMADol (ULTRAM) 50 MG tablet Take 1 tablet (50 mg total) by mouth every 8 (eight) hours as needed. (Patient not taking: Reported on 11/29/2018) 45 tablet 1  . valACYclovir (VALTREX) 1000 MG tablet Take 1 tablet (1,000 mg total) by mouth 2 (two) times daily. (Patient not taking: Reported on 11/29/2018) 60 tablet 6   No current facility-administered medications for this visit.    Facility-Administered Medications Ordered in Other Visits  Medication Dose Route Frequency Provider Last Rate Last Dose  . denosumab  (XGEVA) injection 120 mg  120 mg Subcutaneous Once Cammie Sickle, MD        PHYSICAL EXAMINATION: ECOG PERFORMANCE STATUS: 1 - Symptomatic but completely ambulatory  BP 128/80   Pulse 60   Temp (!) 95.7 F (35.4 C) (Tympanic)   Resp 20   Ht 6' (1.829 m)   Wt 161 lb (73 kg)   BMI 21.84 kg/m   Filed Weights   11/29/18 1035  Weight: 161 lb (73 kg)    Physical Exam  Constitutional: He is oriented to person, place, and time and well-developed, well-nourished, and in no distress.  Alone.  Walking by himself.  HENT:  Head: Normocephalic and atraumatic.  Mouth/Throat: Oropharynx is clear and moist. No oropharyngeal exudate.  Eyes: Pupils are equal, round, and reactive to light.  Neck: Normal range of motion. Neck supple.  Cardiovascular: Normal rate and regular rhythm.  Pulmonary/Chest: No respiratory distress. He has no wheezes.  Abdominal: Soft. Bowel sounds are normal. He exhibits no distension and no mass. There is no abdominal tenderness. There is no rebound and no guarding.  Musculoskeletal: Normal range of motion.  General: No tenderness or edema.  Neurological: He is alert and oriented to person, place, and time.  Skin: Skin is warm.  Psychiatric: Affect normal.       LABORATORY DATA:  I have reviewed the data as listed    Component Value Date/Time   NA 139 11/29/2018 1013   NA 137 11/12/2014 1415   K 4.3 11/29/2018 1013   K 4.1 11/12/2014 1415   CL 108 11/29/2018 1013   CL 105 11/12/2014 1415   CO2 23 11/29/2018 1013   CO2 24 11/12/2014 1415   GLUCOSE 110 (H) 11/29/2018 1013   GLUCOSE 98 11/12/2014 1415   BUN 24 (H) 11/29/2018 1013   BUN 24 (H) 11/12/2014 1415   CREATININE 1.00 11/29/2018 1013   CREATININE 1.02 11/12/2014 1415   CALCIUM 9.2 11/29/2018 1013   CALCIUM 9.2 11/12/2014 1415   PROT 7.2 11/29/2018 1013   PROT 7.4 11/12/2014 1415   ALBUMIN 4.5 11/29/2018 1013   ALBUMIN 4.4 11/12/2014 1415   AST 15 11/29/2018 1013   AST 23  11/12/2014 1415   ALT 10 11/29/2018 1013   ALT 16 (L) 11/12/2014 1415   ALKPHOS 67 11/29/2018 1013   ALKPHOS 103 11/12/2014 1415   BILITOT 0.6 11/29/2018 1013   BILITOT 0.4 11/12/2014 1415   GFRNONAA >60 11/29/2018 1013   GFRNONAA >60 11/12/2014 1415   GFRAA >60 11/29/2018 1013   GFRAA >60 11/12/2014 1415    No results found for: SPEP, UPEP  Lab Results  Component Value Date   WBC 4.3 11/29/2018   NEUTROABS 2.2 11/29/2018   HGB 13.4 11/29/2018   HCT 40.0 11/29/2018   MCV 98.0 11/29/2018   PLT 185 11/29/2018      Chemistry      Component Value Date/Time   NA 139 11/29/2018 1013   NA 137 11/12/2014 1415   K 4.3 11/29/2018 1013   K 4.1 11/12/2014 1415   CL 108 11/29/2018 1013   CL 105 11/12/2014 1415   CO2 23 11/29/2018 1013   CO2 24 11/12/2014 1415   BUN 24 (H) 11/29/2018 1013   BUN 24 (H) 11/12/2014 1415   CREATININE 1.00 11/29/2018 1013   CREATININE 1.02 11/12/2014 1415      Component Value Date/Time   CALCIUM 9.2 11/29/2018 1013   CALCIUM 9.2 11/12/2014 1415   ALKPHOS 67 11/29/2018 1013   ALKPHOS 103 11/12/2014 1415   AST 15 11/29/2018 1013   AST 23 11/12/2014 1415   ALT 10 11/29/2018 1013   ALT 16 (L) 11/12/2014 1415   BILITOT 0.6 11/29/2018 1013   BILITOT 0.4 11/12/2014 1415       RADIOGRAPHIC STUDIES: I have personally reviewed the radiological images as listed and agreed with the findings in the report. No results found.   ASSESSMENT & PLAN:  Prostate cancer metastatic to [facility] sites [facility]) # Castrate resistant prostate cancer-stage IV; on Trelstar q 44M [09/27/2018]; Currently on Xtandi- CT April 2020- STABLE- lung nodule; 12 kyphoplasty stable L2-3 epidural-stable status post radiation;clinically stable; but  April 2020- PSA rising- 8.4  # Overall clinically  STABLE.  Continue Xtandi at this time.    #Bone mets/pain second malignancy--worsening- /X-geva q 4 weeks.  Recommend MRI of the thoracic and lumbar spine ASAP.  Also discussed regarding  bone scan if MRI is unrevealing.  # DISPOSITION: # X-geva today.  # MRI thoracic/lumbar spine asap # Follow up in 5 weeks-MD/ labs- cbc/cmp/PSA/x-geva-Dr.B   Orders Placed This Encounter  Procedures  . MR  Thoracic Spine W Wo Contrast    Standing Status:   Future    Standing Expiration Date:   11/29/2019    Order Specific Question:   ** REASON FOR EXAM (FREE TEXT)    Answer:   bone metastasis to thoracic/lumbar spine    Order Specific Question:   GRA to provide read?    Answer:   Yes    Order Specific Question:   If indicated for the ordered procedure, I authorize the administration of contrast media per Radiology protocol    Answer:   Yes    Order Specific Question:   What is the patient's sedation requirement?    Answer:   No Sedation    Order Specific Question:   Use SRS Protocol?    Answer:   No    Order Specific Question:   Does the patient have a pacemaker or implanted devices?    Answer:   No    Order Specific Question:   Preferred imaging location?    Answer:   [facility] (table limit-350lbs)    Order Specific Question:   Radiology Contrast Protocol - do NOT remove file path    Answer:   _0 [email or web address]\[email or web address].PDF  . MR Lumbar Spine W Wo Contrast    Standing Status:   Future    Standing Expiration Date:   11/29/2019    Order Specific Question:   ** REASON FOR EXAM (FREE TEXT)    Answer:   bone mets/ thoracic/lumabr    Order Specific Question:   If indicated for the ordered procedure, I authorize the administration of contrast media per Radiology protocol    Answer:   Yes    Order Specific Question:   What is the patient's sedation requirement?    Answer:   No Sedation    Order Specific Question:   Does the patient have a pacemaker or implanted devices?    Answer:   No    Order Specific Question:   Use SRS Protocol?    Answer:   No    Order Specific Question:   Radiology Contrast Protocol - do NOT remove file path    Answer:    _1 [email or web address]\[email or web address]    Order Specific Question:   Preferred imaging location?    Answer:   [facility] (table limit-350lbs)   All questions were answered. The patient knows to call the clinic with any problems, questions or concerns.      Cammie Sickle, MD 11/29/2018 11:24 AM

## 2018-11-30 ENCOUNTER — Ambulatory Visit
Admission: RE | Admit: 2018-11-30 | Discharge: 2018-11-30 | Disposition: A | Discharge status: Home / Self Care | Payer: Medicare Other | Source: Ambulatory Visit | Attending: Internal Medicine | Admitting: Internal Medicine

## 2018-11-30 DIAGNOSIS — C7951 Secondary malignant neoplasm of bone: Secondary | ICD-10-CM | POA: Diagnosis not present

## 2018-11-30 DIAGNOSIS — C61 Malignant neoplasm of prostate: Secondary | ICD-10-CM

## 2018-11-30 LAB — PSA: Prostatic Specific Antigen: 13.23 ng/mL — ABNORMAL HIGH (ref 0.00–4.00)

## 2018-11-30 MED ORDER — GADOBUTROL 1 MMOL/ML IV SOLN
7.0000 mL | Freq: Once | INTRAVENOUS | Status: AC | PRN
  Administered 2018-11-30: 7 mL via INTRAVENOUS

## 2018-12-02 ENCOUNTER — Telehealth: Payer: Self-pay | Admitting: Internal Medicine

## 2018-12-02 DIAGNOSIS — C61 Malignant neoplasm of prostate: Secondary | ICD-10-CM

## 2018-12-02 MED ORDER — TRAMADOL HCL 50 MG PO TABS: 50.0000 mg | ORAL_TABLET | Freq: Three times a day (TID) | ORAL | 1 refills | Status: DC | PRN

## 2018-12-02 NOTE — Telephone Encounter (Signed)
I spoke to pt- re: results of MRI; recommend PET scan ASAP for further evaluation.  reocmmend change of treatment given progression of treatment [chemo/RT/Rubraca]. Discussed potential side effects of Rubraca. Pt still undecided.   Please schedule PET ASAP; follow up with me on DOXIMITY- 1 day after the PET.

## 2018-12-10 ENCOUNTER — Other Ambulatory Visit: Payer: Self-pay

## 2018-12-10 ENCOUNTER — Ambulatory Visit
Admission: RE | Admit: 2018-12-10 | Discharge: 2018-12-10 | Disposition: A | Discharge status: Home / Self Care | Payer: Medicare Other | Source: Ambulatory Visit | Attending: Internal Medicine | Admitting: Internal Medicine

## 2018-12-10 DIAGNOSIS — C61 Malignant neoplasm of prostate: Secondary | ICD-10-CM | POA: Insufficient documentation

## 2018-12-10 MED ORDER — AXUMIN (FLUCICLOVINE F 18) INJECTION
10.0000 | Freq: Once | INTRAVENOUS | Status: AC | PRN
  Administered 2018-12-10: 9.79 via INTRAVENOUS

## 2018-12-11 ENCOUNTER — Telehealth: Payer: Self-pay | Admitting: Internal Medicine

## 2018-12-11 NOTE — Telephone Encounter (Signed)
Spoke to patient regarding the results of prostate PET scan-needs a further systemic therapy/radiation.  Patient continues to be reluctant.  C- Please schedule appt on June 3rd/wed at 3pm/please confirm appt with pt; No labs. Thx

## 2018-12-16 ENCOUNTER — Telehealth: Payer: Self-pay

## 2018-12-16 ENCOUNTER — Other Ambulatory Visit: Payer: Self-pay | Admitting: Internal Medicine

## 2018-12-16 DIAGNOSIS — C61 Malignant neoplasm of prostate: Secondary | ICD-10-CM

## 2018-12-16 NOTE — Telephone Encounter (Signed)
Telephone call to patient to schedule TELEHEALTH visit with patient. Patient in agreement with TELEHEALTH visit on 12-19-18 at 10:00 AM.

## 2018-12-16 NOTE — Telephone Encounter (Signed)
PSA  Order: [id number]  Status:  Final result  Visible to patient:  Yes (MyChart)  Next appt:  12/18/2018 at 03:00 PM in Oncology Cammie Sickle, MD)  Dx:  Prostate cancer metastatic to multipl...   Ref Range & Units 2wk ago 7mo ago 38mo ago 23mo ago 67mo ago 60mo ago 79mo ago  Prostatic Specific Antigen 0.00 - 4.00 ng/mL 13.23High   8.48High  CM 4.94High  CM 5.13High  CM 4.13High  CM 4.28High  CM 6.37High  CM  Comment: (NOTE)  While PSA levels of <=4.0 ng/ml are reported as reference range, some  men with levels below 4.0 ng/ml can have prostate cancer and many men  with PSA above 4.0 ng/ml do not have prostate cancer. Other tests  such as free PSA, age specific reference ranges, PSA velocity and PSA  doubling time may be helpful especially in men less than 51 years  old.  Performed at [facility], [place] [address], [place], [place]  [address]   Resulting Agency  [place] [facility] [place] [facility] [place] [facility] [place] [facility] [place] [facility] [place] [facility] [place] [facility]      Specimen Collected: 11/29/18 10:13  Last Resulted: 11/30/18 00:24     Lab Flowsheet    Order Details    View Encounter    Lab and Collection Details    Routing    Result History      CM=Additional comments      Other Results from 11/29/2018   Contains abnormal data Comprehensive metabolic panel  Order: [id number]   Status:  Final result  Visible to patient:  Yes (MyChart)  Next appt:  12/18/2018 at 03:00 PM in Oncology Cammie Sickle, MD)  Dx:  Prostate cancer metastatic to multipl...   Ref Range & Units 2wk ago 44mo ago 53mo ago 13mo ago 82mo ago 75mo ago 105mo ago  Sodium 135 - 145 mmol/L 139  139  141  139  141  138  139   Potassium 3.5 - 5.1 mmol/L 4.3  3.9  3.9  4.1  4.2  4.0  4.2   Chloride 98 - 111 mmol/L 108  106  107  105  107  106  107   CO2 22 - 32 mmol/L 23  27  26  27  26  25  25    Glucose, Bld 70 - 99 mg/dL 110High   94  124High   103High   101High   102High   105High    BUN 8 - 23 mg/dL 24High    24High   28High   21  20  23  19    Creatinine, Ser 0.61 - 1.24 mg/dL 1.00  1.08  1.00  0.89  0.95  0.84  0.91   Calcium 8.9 - 10.3 mg/dL 9.2  9.4  9.4  9.0  9.2  8.9  9.2   Total Protein 6.5 - 8.1 g/dL 7.2  7.1  7.0  7.0  7.1  6.5  6.8   Albumin 3.5 - 5.0 g/dL 4.5  4.3  4.3  4.1  4.1  3.8  3.9   AST 15 - 41 U/L 15  12Low   10Low   15  16  16  15    ALT 0 - 44 U/L 10  9  10  13  10  8  9    Alkaline Phosphatase 38 - 126 U/L 67  58  62  67  59  47  58   Total Bilirubin 0.3 - 1.2 mg/dL 0.6  0.8  0.6  0.8  0.5  0.5  0.5   GFR calc non Af Amer >60 mL/min >60  >60  >60  >60  >60  >60  >60   GFR calc Af Amer >60 mL/min >60  >60  >60  >60  >60  >60  >60 CM  Anion gap 5 - 15 8  6  CM 8 CM 7 CM 8 CM 7 CM 7 CM  Comment: Performed at [facility], [address] [place], [place] [address]  Resulting Agency  [facility] [place] [facility] [place] [facility] [place] [facility] [place] [facility] [place] [facility] [place] [facility]      Specimen Collected: 11/29/18 10:13  Last Resulted: 11/29/18 10:34     Lab Flowsheet    Order Details    View Encounter    Lab and Collection Details    Routing    Result History      CM=Additional comments        Contains abnormal data CBC with Differential/Platelet  Order: [id number]   Status:  Final result  Visible to patient:  Yes (MyChart)  Next appt:  12/18/2018 at 03:00 PM in Oncology Cammie Sickle, MD)  Dx:  Prostate cancer metastatic to multipl...   Ref Range & Units 2wk ago 72mo ago 65mo ago 82mo ago 3mo ago 59mo ago 30mo ago  WBC 4.0 - 10.5 K/uL 4.3  4.3  4.8  4.0  3.7Low   4.6  5.2   RBC 4.22 - 5.81 MIL/uL 4.08Low   3.96Low   3.64Low   3.73Low   3.75Low   3.53Low   3.64Low    Hemoglobin 13.0 - 17.0 g/dL 13.4  12.9Low   12.1Low   12.3Low   12.3Low   11.4Low   12.1Low    HCT 39.0 - 52.0 % 40.0  39.0  36.2Low   37.6Low   37.7Low   35.9Low   36.5Low    MCV 80.0 - 100.0 fL 98.0  98.5  99.5  100.8High   100.5High   101.7High   100.3High    MCH 26.0 - 34.0 pg 32.8  32.6   33.2  33.0  32.8  32.3  33.2   MCHC 30.0 - 36.0 g/dL 33.5  33.1  33.4  32.7  32.6  31.8  33.2   RDW 11.5 - 15.5 % 13.1  13.2  13.2  13.8  13.2  13.3  13.4   Platelets 150 - 400 K/uL 185  187  192  196  200  186  214   nRBC 0.0 - 0.2 % 0.0  0.0  0.0  0.0  0.0  0.0  0.0   Neutrophils Relative % % 51  50  60  54  49  57  66   Neutro Abs 1.7 - 7.7 K/uL 2.2  2.1  2.8  2.2  1.8  2.7  3.4   Lymphocytes Relative % 27  27  22  26  29  19  16    Lymphs Abs 0.7 - 4.0 K/uL 1.2  1.2  1.0  1.1  1.1  0.9  0.8   Monocytes Relative % 12  11  10  12  13  13  11    Monocytes Absolute 0.1 - 1.0 K/uL 0.5  0.5  0.5  0.5  0.5  0.6  0.6   Eosinophils Relative % 8  11  8  7  8  10  7    Eosinophils Absolute 0.0 - 0.5 K/uL 0.4  0.5  0.4  0.3  0.3  0.5  0.4   Basophils Relative % 1  1  0  1  1  1   0   Basophils Absolute 0.0 - 0.1 K/uL 0.0  0.0  0.0  0.0  0.0  0.0  0.0   Immature Granulocytes % 1  0  0  0  0  0  0   Abs Immature Granulocytes 0.00 - 0.07 K/uL 0.02  0.01 CM 0.01 CM 0.01 CM 0.01 CM 0.02 CM 0.02 CM  Comment: Performed at [facility], [address] [place], [place] [address]  Resulting Agency  [facility] [place] [facility] [place] [facility] [place] [facility] [place] [facility] [place] [facility] [place] [facility]      Specimen Collected: 11/29/18 10:13  Last Resulted: 11/29/18 10:24

## 2018-12-18 ENCOUNTER — Other Ambulatory Visit: Payer: Self-pay

## 2018-12-18 ENCOUNTER — Inpatient Hospital Stay: Payer: Medicare Other | Attending: Internal Medicine | Admitting: Internal Medicine

## 2018-12-18 ENCOUNTER — Encounter: Payer: Self-pay | Admitting: Internal Medicine

## 2018-12-18 VITALS — BP 119/79 | HR 77 | Temp 97.9°F | Resp 18 | Ht 72.0 in | Wt 152.4 lb

## 2018-12-18 DIAGNOSIS — C7951 Secondary malignant neoplasm of bone: Secondary | ICD-10-CM

## 2018-12-18 DIAGNOSIS — R5381 Other malaise: Secondary | ICD-10-CM | POA: Insufficient documentation

## 2018-12-18 DIAGNOSIS — Z79899 Other long term (current) drug therapy: Secondary | ICD-10-CM | POA: Diagnosis not present

## 2018-12-18 DIAGNOSIS — Z806 Family history of leukemia: Secondary | ICD-10-CM | POA: Diagnosis not present

## 2018-12-18 DIAGNOSIS — C61 Malignant neoplasm of prostate: Secondary | ICD-10-CM | POA: Insufficient documentation

## 2018-12-18 DIAGNOSIS — G893 Neoplasm related pain (acute) (chronic): Secondary | ICD-10-CM | POA: Insufficient documentation

## 2018-12-18 DIAGNOSIS — R5383 Other fatigue: Secondary | ICD-10-CM | POA: Insufficient documentation

## 2018-12-18 DIAGNOSIS — M25552 Pain in left hip: Secondary | ICD-10-CM | POA: Diagnosis not present

## 2018-12-18 DIAGNOSIS — Z801 Family history of malignant neoplasm of trachea, bronchus and lung: Secondary | ICD-10-CM | POA: Diagnosis not present

## 2018-12-18 DIAGNOSIS — Z803 Family history of malignant neoplasm of breast: Secondary | ICD-10-CM | POA: Diagnosis not present

## 2018-12-18 DIAGNOSIS — Z79818 Long term (current) use of other agents affecting estrogen receptors and estrogen levels: Secondary | ICD-10-CM | POA: Diagnosis not present

## 2018-12-18 DIAGNOSIS — R911 Solitary pulmonary nodule: Secondary | ICD-10-CM | POA: Insufficient documentation

## 2018-12-18 NOTE — Assessment & Plan Note (Addendum)
#  Castrate resistant prostate cancer-stage IV; on Trelstar q 64M [09/27/2018]; Currently on Xtandi-May 28th PET- Progression; clinically worsening; PSA steadily rising at 13.   # Currently on Xtandi.  However as noted above clinical progression noted-with worsening pain.  #I discussed/reviewed at length the multiple treatment options including but not limited to-systemic chemotherapy with docetaxel/radium 223/braca inhibitor- as pt has somatic BRCA-2 mutation positive.  I discussed the pros and cons of each therapy.  Also discussed regarding use of external beam radiation for sites of worsening disease.   #After the lengthy discussion patient is still undecided.  I recommended him to talk to his family and come up with a decision sooner than later.   #Bone mets/pain second malignancy--worsening- /X-geva q 4 weeks.    # DISPOSITION: # keep appts as planned on June 12th- Dr.B  # I reviewed the blood work- with the patient in detail; also reviewed the imaging independently [as summarized above]; and with the patient in detail.

## 2018-12-18 NOTE — Progress Notes (Signed)
[place] OFFICE PROGRESS NOTE  Patient Care Team: Cammie Sickle, MD as PCP - General (Internal Medicine) Royston Cowper, MD (Urology)  Cancer Staging Cancer of prostate 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   # MARCH 2019- Taken off trial [ based on interm analysis]; continue X-tandi [given not needing steroids]  # July 2019- CT/bone scan stable; MRI lumbar spine- epidural extension/L3 spinal nerves involvement; on RT [finish sep 12th]  # vertebroplasty [[facility]ke] on sep 13th 2019.  # RLL [~12m lung nodule] s/p Bx- Cryptococcus- ID/Dr.Fitzgerald- on diflucan  # Xgeva  # MOLECULAR TESTING/omniseq- BRCA-2 MUTATED; PDL-1- NEG; MSS; N-TRK-NEG  # GENETICS- BRCA-NEG; hetrozygous MUTHY; recommended family work up --------------------------------------------------    DIAGNOSIS: '[ ]'$  Metastatic prostate cancer  STAGE: 4    ;GOALS: Palliative  CURRENT/MOST RECENT THERAPY-X-tandi+ Trelstar      Cancer of prostate ([facility]   08/17/2012 Initial Diagnosis    Cancer of prostate, stage II    12/03/2013 Progression       10/13/2014 Progression        Prostate cancer metastatic to multiple sites ([facility]      INTERVAL HISTORY:  William SLABACH89y.[id number].  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  Patient continues to notice worsening left hip pain  radiating to his thigh.  Denies any loss of bladder or bowel control.  Denies any weakness in his legs.  Still not compliant with his pain medication.  Denies any headaches.  Denies any nausea vomiting.    Review of Systems  Constitutional: Positive for malaise/fatigue. Negative for chills, diaphoresis and fever.  HENT: Negative for nosebleeds and sore throat.   Eyes: Negative for double vision.  Respiratory: Negative for cough, hemoptysis, sputum production, shortness of breath and wheezing.   Cardiovascular: Negative for chest pain, palpitations, orthopnea and leg swelling.  Gastrointestinal: Negative for abdominal pain, blood in stool, constipation, diarrhea, heartburn, melena, nausea and vomiting.  Genitourinary: Negative for dysuria, frequency and urgency.  Musculoskeletal: Positive for back pain and joint pain.  Skin: Negative.  Negative for itching and rash.  Neurological: Negative for dizziness, focal weakness, weakness and headaches.  Endo/Heme/Allergies: Does not bruise/bleed easily.  Psychiatric/Behavioral: Negative for depression. The patient does not have insomnia.       PAST MEDICAL HISTORY :  Past Medical History:  Diagnosis Date  . Cancer of prostate ([facility] 07/2012   OraL chemo pill.   . Family history of leukemia     PAST SURGICAL HISTORY :   Past Surgical History:  Procedure Laterality Date  . EXTRACORPOREAL SHOCK WAVE LITHOTRIPSY Left 05/20/2015   Procedure: EXTRACORPOREAL SHOCK WAVE LITHOTRIPSY (ESWL);  Surgeon: MRoyston Cowper MD;  Location: [facility] ORS;  Service: Urology;  Laterality: Left;  . EXTRACORPOREAL SHOCK WAVE LITHOTRIPSY Right 12/21/2016   Procedure: EXTRACORPOREAL SHOCK WAVE LITHOTRIPSY (ESWL);  Surgeon: WRoyston Cowper MD;  Location: [facility] ORS;  Service: Urology;  Laterality: Right;  . IR  RADIOLOGIST EVAL & MGMT  02/28/2018    FAMILY HISTORY :   Family History  Problem Relation Age of Onset  . Alzheimer's disease Father   . Lung cancer Maternal  Grandfather 17  . Leukemia Paternal Grandmother 52  . COPD Paternal Grandfather   . Cancer Maternal Uncle        type unk, dx 80's    SOCIAL HISTORY:   Social History   Tobacco Use  . Smoking status: Never Smoker  . Smokeless tobacco: Never Used  Substance Use Topics  . Alcohol use: No  . Drug use: No    ALLERGIES:  is allergic to no known allergies.  MEDICATIONS:  Current Outpatient Medications  Medication Sig Dispense Refill  . gabapentin (NEURONTIN) 100 MG capsule Take 1 capsule (100 mg total) by mouth 3 (three) times daily. 90 capsule 3  . HYDROcodone-acetaminophen (NORCO/VICODIN) 5-325 MG tablet Take 1 tablet by mouth every 6 (six) hours as needed for moderate pain.    Marland Kitchen oxybutynin (DITROPAN-XL) 5 MG 24 hr tablet Take 1 tablet (5 mg total) by mouth at bedtime. 90 tablet 3  . tamsulosin (FLOMAX) 0.4 MG CAPS capsule Take 1 capsule (0.4 mg total) by mouth daily. 30 capsule 11  . traMADol (ULTRAM) 50 MG tablet Take 1 tablet (50 mg total) by mouth every 8 (eight) hours as needed. 45 tablet 1  . Triptorelin Pamoate (TRELSTAR) 22.5 MG injection Inject 22.5 mg into the muscle every 6 (six) months.    Gillermina Phy 40 MG capsule TAKE 4 CAPSULES (160 MG TOTAL) BY MOUTH DAILY. 120 capsule 4  . ondansetron (ZOFRAN) 8 MG tablet Take 1 tablet (8 mg total) by mouth every 8 (eight) hours as needed for nausea or vomiting. (Patient not taking: Reported on 11/29/2018) 20 tablet 0  . promethazine (PHENERGAN) 25 MG tablet Take 1 tablet (25 mg total) by mouth every 6 (six) hours as needed for nausea or vomiting. (Patient not taking: Reported on 11/29/2018) 30 tablet 0  . valACYclovir (VALTREX) 1000 MG tablet Take 1 tablet (1,000 mg total) by mouth 2 (two) times daily. (Patient not taking: Reported on 11/29/2018) 60 tablet 6   No current facility-administered medications for this visit.     PHYSICAL EXAMINATION: ECOG PERFORMANCE STATUS: 1 - Symptomatic but completely ambulatory  BP 119/79   Pulse 77    Temp 97.9 F (36.6 C) (Tympanic)   Resp 18   Ht 6' (1.829 m)   Wt 152 lb 6.4 oz (69.1 kg)   BMI 20.67 kg/m   Filed Weights   12/18/18 1445  Weight: 152 lb 6.4 oz (69.1 kg)    Physical Exam  Constitutional: He is oriented to person, place, and time and well-developed, well-nourished, and in no distress.  Alone.  Walking by himself.  HENT:  Head: Normocephalic and atraumatic.  Mouth/Throat: Oropharynx is clear and moist. No oropharyngeal exudate.  Eyes: Pupils are equal, round, and reactive to light.  Neck: Normal range of motion. Neck supple.  Cardiovascular: Normal rate and regular rhythm.  Pulmonary/Chest: No respiratory distress. He has no wheezes.  Abdominal: Soft. Bowel sounds are normal. He exhibits no distension and no mass. There is no abdominal tenderness. There is no rebound and no guarding.  Musculoskeletal: Normal range of motion.        General: No tenderness or edema.  Neurological: He is alert and oriented to person, place, and time.  Skin: Skin is warm.  Psychiatric: Affect normal.  LABORATORY DATA:  I have reviewed the data as listed    Component Value Date/Time   NA 139 11/29/2018 1013   NA 137 11/12/2014 1415   K 4.3 11/29/2018 1013   K 4.1 11/12/2014 1415   CL 108 11/29/2018 1013   CL 105 11/12/2014 1415   CO2 23 11/29/2018 1013   CO2 24 11/12/2014 1415   GLUCOSE 110 (H) 11/29/2018 1013   GLUCOSE 98 11/12/2014 1415   BUN 24 (H) 11/29/2018 1013   BUN 24 (H) 11/12/2014 1415   CREATININE 1.00 11/29/2018 1013   CREATININE 1.02 11/12/2014 1415   CALCIUM 9.2 11/29/2018 1013   CALCIUM 9.2 11/12/2014 1415   PROT 7.2 11/29/2018 1013   PROT 7.4 11/12/2014 1415   ALBUMIN 4.5 11/29/2018 1013   ALBUMIN 4.4 11/12/2014 1415   AST 15 11/29/2018 1013   AST 23 11/12/2014 1415   ALT 10 11/29/2018 1013   ALT 16 (L) 11/12/2014 1415   ALKPHOS 67 11/29/2018 1013   ALKPHOS 103 11/12/2014 1415   BILITOT 0.6 11/29/2018 1013   BILITOT 0.4 11/12/2014  1415   GFRNONAA >60 11/29/2018 1013   GFRNONAA >60 11/12/2014 1415   GFRAA >60 11/29/2018 1013   GFRAA >60 11/12/2014 1415    No results found for: SPEP, UPEP  Lab Results  Component Value Date   WBC 4.3 11/29/2018   NEUTROABS 2.2 11/29/2018   HGB 13.4 11/29/2018   HCT 40.0 11/29/2018   MCV 98.0 11/29/2018   PLT 185 11/29/2018      Chemistry      Component Value Date/Time   NA 139 11/29/2018 1013   NA 137 11/12/2014 1415   K 4.3 11/29/2018 1013   K 4.1 11/12/2014 1415   CL 108 11/29/2018 1013   CL 105 11/12/2014 1415   CO2 23 11/29/2018 1013   CO2 24 11/12/2014 1415   BUN 24 (H) 11/29/2018 1013   BUN 24 (H) 11/12/2014 1415   CREATININE 1.00 11/29/2018 1013   CREATININE 1.02 11/12/2014 1415      Component Value Date/Time   CALCIUM 9.2 11/29/2018 1013   CALCIUM 9.2 11/12/2014 1415   ALKPHOS 67 11/29/2018 1013   ALKPHOS 103 11/12/2014 1415   AST 15 11/29/2018 1013   AST 23 11/12/2014 1415   ALT 10 11/29/2018 1013   ALT 16 (L) 11/12/2014 1415   BILITOT 0.6 11/29/2018 1013   BILITOT 0.4 11/12/2014 1415       RADIOGRAPHIC STUDIES: I have personally reviewed the radiological images as listed and agreed with the findings in the report. No results found.   ASSESSMENT & PLAN:  Prostate cancer metastatic to multiple sites [facility]) # Castrate resistant prostate cancer-stage IV; on Trelstar q 42M [09/27/2018]; Currently on Xtandi-May 28th PET- Progression; clinically worsening; PSA steadily rising at 13.   # Currently on Xtandi.  However as noted above clinical progression noted-with worsening pain.  #I discussed/reviewed at length the multiple treatment options including but not limited to-systemic chemotherapy with docetaxel/radium 223/braca inhibitor- as pt has somatic BRCA-2 mutation positive.  I discussed the pros and cons of each therapy.  Also discussed regarding use of external beam radiation for sites of worsening disease.   #After the lengthy discussion patient  is still undecided.  I recommended him to talk to his family and come up with a decision sooner than later.   #Bone mets/pain second malignancy--worsening- /X-geva q 4 weeks.    # DISPOSITION: # keep appts as planned on June 12th- Dr.B  #  I reviewed the blood work- with the patient in detail; also reviewed the imaging independently [as summarized above]; and with the patient in detail.     No orders of the defined types were placed in this encounter.  All questions were answered. The patient knows to call the clinic with any problems, questions or concerns.      Cammie Sickle, MD 12/25/2018 1:39 PM

## 2018-12-19 ENCOUNTER — Other Ambulatory Visit: Payer: Medicare Other

## 2018-12-19 DIAGNOSIS — Z515 Encounter for palliative care: Secondary | ICD-10-CM

## 2018-12-19 MED FILL — XTANDI 40 MG CAPSULE: 40 | 30 days supply | Qty: 120 | Fill #0

## 2018-12-19 NOTE — Progress Notes (Signed)
COMMUNITY PALLIATIVE CARE SW NOTE  PATIENT NAME: William Wright DOB: 10/08/1950 MRN: [id number]  PRIMARY CARE PROVIDER: Cammie Sickle, MD  RESPONSIBLE PARTY:  Acct ID - Guarantor Home Phone Work Phone Relationship Acct Type  [id number] [name]  Self P/F     [address] [place], [place] [address]     PLAN OF CARE and INTERVENTIONS:             1. GOALS OF CARE/ ADVANCE CARE PLANNING:  Patient wishes to remain at home and be able to do the things that he enjoys doing. Patient has Living will and HCPOA on file. 2. SOCIAL/EMOTIONAL/SPIRITUAL ASSESSMENT/ INTERVENTIONS:  SW and RN met with patient via TELEHEALTH. Patient reports doing "well". Patient reports that he had a PET scan done on 5/26, and it showed three new spots on his hip, and one new spot on his back. Patient has talked with oncologist about his options including a new chemo drug and radiation injections. Patient is unsure of what he wants to do. Patient reports increased pain in mornings and evenings. Patient takes tylenol and has tramadol to take PRN. Patient said that tramadol does help "some". Patient said that he is sleeping well, sleeping about 5-6 hours straight at night. 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 Patient has also been asked to do an interview to help with an [place], and his looking forward to this.  3. PATIENT/CAREGIVER EDUCATION/ COPING:  Patient is trying to maintain positive outlook. Following difficult conversation with oncologist, patient is struggling with making a decision for treatment.  4. PERSONAL EMERGENCY PLAN:  Patient/family would call 9-1-1 in case of emergency. Due to COVID-19, patient verbalized that he is limiting contacts and remaining at home, as able. 5. COMMUNITY RESOURCES COORDINATION/ HEALTH CARE NAVIGATION:  Follow-up  with oncologist on 6/12. Patient manages all of his care, no issues. 6. FINANCIAL/LEGAL CONCERNS/INTERVENTIONS:  None.     SOCIAL HX:  Social History   Tobacco Use  . Smoking status: Never Smoker  . Smokeless tobacco: Never Used  Substance Use Topics  . Alcohol use: No    CODE STATUS:   Code Status: Not on file  ADVANCED DIRECTIVES: Y MOST FORM COMPLETE:  No. HOSPICE EDUCATION PROVIDED: None.  PPS: Patient is independent of ADLs.   Due to the COVID-19 crisis, this visit was done via Zoom from my office and it was initiated and consent by this patient and or family.This was a scheduled visit.   I spent 30 minutes with this patient from 10:00-10:45a providing consultation and support.    Margaretmary Lombard, LCSW

## 2018-12-19 NOTE — Progress Notes (Signed)
PATIENT NAME: William Wright DOB: 1950-09-20 MRN: [id number]  PRIMARY CARE PROVIDER: Cammie Sickle, MD  RESPONSIBLE PARTY:  Acct ID - Guarantor Home Phone Work Phone Relationship Acct Type  [id number] [name]  Self P/F     [address], [place], [place] [address]    PLAN OF CARE and INTERVENTIONS:               1.  GOALS OF CARE/ ADVANCE CARE PLANNING:  Remain at home with wife and have quality of life.               2.  PATIENT/CAREGIVER EDUCATION: Education on pain meds for nerve pain, education on disease progression, education and support.               3.  DISEASE STATUS: Patient is a 83 year patient with prostate cancer.  Patient lives with [name] and remains independent with self care. Due to the COVID-19 crisis, this visit was done via telemedicine from my office and it was initiated and consent by this patient and or family. Patient smiling and pleasant during visit with SW and RN.  Patient reports he is trying to remain positive after receiving news from PET scan that was done on 12/10/18.  Patient reports MD informed him scan showed disease progression.  Patient has 3 news spots on hip and 1 new spot on back.  Patient reports MD infomred him of 3 options available to him.  Patient reports he wants quality of life and that no treatments are curative.  Patient reports new chemo treatment was offered as well as radiation injection or radiation with beam.  Patient is undecided which option he will choose.  Patient reports having increased pain in his back.  Patient describes pain as tingling and cracking.  Patient has been using Tylenol for pain as well as Tramadol prn.  Patient reports getting "some relief" with Tramadol.  RN provided education to patient on advantages of taking Gabapentin for nerve pain.  Patient seems more open to this than he has been in the past.  Patient to talk with MD about Gabapentin.  Patient reports his appetite remains good.  Patient  reports he has been sleeping well and is able to sleep 5-6 hours without waking up.  Patient denies having any shortness of breath or cough.  Patient denies having any nausea or vomiting.  Patient has no edema in his lower extremities.  Patient reports his last PSA was 13.  Patient talks about going to [facility] last week and playing music with William Wright.  Support provided to patient. Patient encouraged to call with questions or concerns.  Patient has appointment scheduled with MD 12/27/18.             HISTORY OF PRESENT ILLNESS:    CODE STATUS: Full Code  ADVANCED DIRECTIVES: Y MOST FORM: No PPS: 60%   PHYSICAL EXAM:   VITALS: Telehealth visit no vital signs available  LUNGS: denies cough/shortness of breath CARDIAC:  EXTREMITIES: none edema SKIN: Skin color, texture, turgor normal. No rashes or lesions  NEURO: Alert and oriented x 4       [name], RN

## 2018-12-24 ENCOUNTER — Ambulatory Visit (INDEPENDENT_AMBULATORY_CARE_PROVIDER_SITE_OTHER): Payer: Medicare Other | Admitting: Vascular Surgery

## 2018-12-27 ENCOUNTER — Inpatient Hospital Stay: Payer: Medicare Other

## 2018-12-27 ENCOUNTER — Other Ambulatory Visit: Payer: Self-pay

## 2018-12-27 ENCOUNTER — Encounter: Payer: Self-pay | Admitting: Internal Medicine

## 2018-12-27 ENCOUNTER — Inpatient Hospital Stay (HOSPITAL_BASED_OUTPATIENT_CLINIC_OR_DEPARTMENT_OTHER): Payer: Medicare Other | Admitting: Internal Medicine

## 2018-12-27 DIAGNOSIS — Z801 Family history of malignant neoplasm of trachea, bronchus and lung: Secondary | ICD-10-CM | POA: Diagnosis not present

## 2018-12-27 DIAGNOSIS — M25552 Pain in left hip: Secondary | ICD-10-CM

## 2018-12-27 DIAGNOSIS — C7951 Secondary malignant neoplasm of bone: Secondary | ICD-10-CM

## 2018-12-27 DIAGNOSIS — R5383 Other fatigue: Secondary | ICD-10-CM | POA: Diagnosis not present

## 2018-12-27 DIAGNOSIS — Z79818 Long term (current) use of other agents affecting estrogen receptors and estrogen levels: Secondary | ICD-10-CM

## 2018-12-27 DIAGNOSIS — C61 Malignant neoplasm of prostate: Secondary | ICD-10-CM | POA: Diagnosis not present

## 2018-12-27 DIAGNOSIS — Z803 Family history of malignant neoplasm of breast: Secondary | ICD-10-CM | POA: Diagnosis not present

## 2018-12-27 DIAGNOSIS — R5381 Other malaise: Secondary | ICD-10-CM | POA: Diagnosis not present

## 2018-12-27 DIAGNOSIS — R911 Solitary pulmonary nodule: Secondary | ICD-10-CM | POA: Diagnosis not present

## 2018-12-27 DIAGNOSIS — Z79899 Other long term (current) drug therapy: Secondary | ICD-10-CM | POA: Diagnosis not present

## 2018-12-27 DIAGNOSIS — G893 Neoplasm related pain (acute) (chronic): Secondary | ICD-10-CM | POA: Diagnosis not present

## 2018-12-27 LAB — COMPREHENSIVE METABOLIC PANEL
ALT: 50 U/L — ABNORMAL HIGH (ref 0–44)
AST: 36 U/L (ref 15–41)
Albumin: 4.2 g/dL (ref 3.5–5.0)
Alkaline Phosphatase: 85 U/L (ref 38–126)
Anion gap: 7 (ref 5–15)
BUN: 20 mg/dL (ref 8–23)
CO2: 28 mmol/L (ref 22–32)
Calcium: 9.4 mg/dL (ref 8.9–10.3)
Chloride: 105 mmol/L (ref 98–111)
Creatinine, Ser: 1.07 mg/dL (ref 0.61–1.24)
GFR calc Af Amer: >60 mL/min (ref >60)
GFR calc non Af Amer: >60 mL/min (ref >60)
Glucose, Bld: 110 mg/dL — ABNORMAL HIGH (ref 70–99)
Potassium: 4.1 mmol/L (ref 3.5–5.1)
Sodium: 140 mmol/L (ref 135–145)
Total Bilirubin: 0.6 mg/dL (ref 0.3–1.2)
Total Protein: 6.9 g/dL (ref 6.5–8.1)

## 2018-12-27 LAB — CBC WITH DIFFERENTIAL/PLATELET
Abs Immature Granulocytes: 0.02 10*3/uL (ref 0.00–0.07)
Basophils Absolute: 0 10*3/uL (ref 0.0–0.1)
Basophils Relative: 0 %
Eosinophils Absolute: 0.7 10*3/uL — ABNORMAL HIGH (ref 0.0–0.5)
Eosinophils Relative: 15 %
HCT: 39.7 % (ref 39.0–52.0)
Hemoglobin: 13.4 g/dL (ref 13.0–17.0)
Immature Granulocytes: 0 %
Lymphocytes Relative: 23 %
Lymphs Abs: 1.1 10*3/uL (ref 0.7–4.0)
MCH: 32.8 pg (ref 26.0–34.0)
MCHC: 33.8 g/dL (ref 30.0–36.0)
MCV: 97.1 fL (ref 80.0–100.0)
Monocytes Absolute: 0.6 10*3/uL (ref 0.1–1.0)
Monocytes Relative: 13 %
Neutro Abs: 2.3 10*3/uL (ref 1.7–7.7)
Neutrophils Relative %: 49 %
Platelets: 185 10*3/uL (ref 150–400)
RBC: 4.09 MIL/uL — ABNORMAL LOW (ref 4.22–5.81)
RDW: 13.1 % (ref 11.5–15.5)
WBC: 4.7 10*3/uL (ref 4.0–10.5)
nRBC: 0 % (ref 0.0–0.2)

## 2018-12-27 LAB — PSA: Prostatic Specific Antigen: 18.49 ng/mL — ABNORMAL HIGH (ref 0.00–4.00)

## 2018-12-27 MED ORDER — DENOSUMAB 120 MG/1.7ML ~~LOC~~ SOLN
120.0000 mg | Freq: Once | SUBCUTANEOUS | Status: AC
  Administered 2018-12-27: 120 mg via SUBCUTANEOUS
  Filled 2018-12-27: qty 1.7

## 2018-12-27 NOTE — Assessment & Plan Note (Addendum)
#  Castrate resistant prostate cancer-stage IV; on Trelstar q 20M [09/27/2018]; Currently on Xtandi-May 28th PET- Progression; clinically progressing/worsening.  PSA steadily rising at 13.   #Patient currently on Xtandi.  Clinically worsening.  #Again reviewed the multiple treatment options including systemic therapy/docetaxel/radium BRCA inhibitors.  Discussed pros and cons of each therapy.  Discussed without any treatment life expectancy is on the order of 6 to 12 months; and with treatments the median left expectancy is 12 to 24 months; provided patient proceeds with recommended treatments.  Patient has been extremely reluctant with any treatments so far.  #Patient finally agrees to [place] today.  We will make a referral to Dr. [name].  Discussed the procedure; and also potential side effects.   #Bone mets/pain second malignancy--worsening- /X-geva q 4 weeks.    # DISPOSITION: # referral to Dr.Crystal re: Xof[place] early next week.  # x-geva today. # follow up in 4 weeks- MD/labs- cbc/cmp/PSA/ x-geva-Dr.B

## 2018-12-27 NOTE — Progress Notes (Signed)
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   # MARCH 2019- Taken off trial [ based on interm analysis]; continue X-tandi [given not needing steroids]  # July 2019- CT/bone scan stable; MRI lumbar spine- epidural extension/L3 spinal nerves involvement; on RT [finish sep 12th]  # vertebroplasty [[facility]ke] on sep 13th 2019.  # RLL [~52m lung nodule] s/p Bx- Cryptococcus- ID/Dr.Fitzgerald- on diflucan  # Xgeva  # MOLECULAR TESTING/omniseq- BRCA-2 MUTATED; PDL-1- NEG; MSS; N-TRK-NEG  # GENETICS- BRCA-NEG; hetrozygous MUTHY; recommended family work up --------------------------------------------------    DIAGNOSIS: '[ ]'  Metastatic prostate cancer  STAGE: 4    ;GOALS: Palliative  CURRENT/MOST RECENT THERAPY-X-tandi+ Trelstar    [place]  08[place]/2012 Initial Diagnosis   Cancer of prostate, stage II   12/03/2013 Progression     10/13/2014 Progression     Prostate cancer metastatic to multiple sites ([facility]      INTERVAL HISTORY:  William HIPPLER89y.o.  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  Since last visit patient started Neurontin taken at nighttime.  He noticed that  Neurontin helping his left hip pain.  Radiation to left thigh is improved.  Denies any loss of bladder or bowel control.  No weakness in the legs.  Mild fatigue.  Appetite is fair.   Review of Systems  Constitutional: Positive for malaise/fatigue. Negative for chills, diaphoresis and fever.  HENT: Negative for nosebleeds and sore throat.   Eyes: Negative for double vision.  Respiratory: Negative for cough, hemoptysis, sputum production, shortness of breath and wheezing.   Cardiovascular: Negative for chest pain, palpitations, orthopnea and leg swelling.  Gastrointestinal: Negative for abdominal pain, blood in stool, constipation, diarrhea, heartburn, melena, nausea and vomiting.  Genitourinary: Negative for dysuria, frequency and urgency.  Musculoskeletal: Positive for back pain and joint pain.  Skin: Negative.  Negative for itching and rash.  Neurological: Negative for dizziness, focal weakness, weakness and headaches.  Endo/Heme/Allergies: Does not bruise/bleed easily.  Psychiatric/Behavioral: Negative for depression. The patient does not have insomnia.       PAST MEDICAL HISTORY :  Past Medical History:  Diagnosis Date  . [place]   OraL chemo pill.   . Family history of leukemia     PAST SURGICAL HISTORY :   Past Surgical History:  Procedure Laterality Date  . EXTRACORPOREAL SHOCK WAVE LITHOTRIPSY Left 05/20/2015   Procedure: EXTRACORPOREAL SHOCK WAVE LITHOTRIPSY (ESWL);  Surgeon: MRoyston Cowper MD;  Location: [facility] ORS;  Service: Urology;  Laterality: Left;  . EXTRACORPOREAL SHOCK WAVE LITHOTRIPSY Right 12/21/2016   Procedure: EXTRACORPOREAL SHOCK WAVE LITHOTRIPSY (ESWL);  Surgeon: WRoyston Cowper MD;  Location: [facility] ORS;  Service: Urology;  Laterality: Right;  . IR RADIOLOGIST EVAL & MGMT  02/28/2018    FAMILY HISTORY :  Family History  Problem Relation Age of Onset  . Alzheimer's disease Father   . Lung cancer Maternal Grandfather 24  .  Leukemia Paternal Grandmother 36  . COPD Paternal Grandfather   . Cancer Maternal Uncle        type unk, dx 80's    SOCIAL HISTORY:   Social History   Tobacco Use  . Smoking status: Never Smoker  . Smokeless tobacco: Never Used  Substance Use Topics  . Alcohol use: No  . Drug use: No    ALLERGIES:  is allergic to no known allergies.  MEDICATIONS:  Current Outpatient Medications  Medication Sig Dispense Refill  . gabapentin (NEURONTIN) 100 MG capsule Take 1 capsule (100 mg total) by mouth 3 (three) times daily. 90 capsule 3  . HYDROcodone-acetaminophen (NORCO/VICODIN) 5-325 MG tablet Take 1 tablet by mouth every 6 (six) hours as needed for moderate pain.    Marland Kitchen oxybutynin (DITROPAN-XL) 5 MG 24 hr tablet Take 1 tablet (5 mg total) by mouth at bedtime. 90 tablet 3  . tamsulosin (FLOMAX) 0.4 MG CAPS capsule Take 1 capsule (0.4 mg total) by mouth daily. 30 capsule 11  . traMADol (ULTRAM) 50 MG tablet Take 1 tablet (50 mg total) by mouth every 8 (eight) hours as needed. 45 tablet 1  . Triptorelin Pamoate (TRELSTAR) 22.5 MG injection Inject 22.5 mg into the muscle every 6 (six) months.    Gillermina Phy 40 MG capsule TAKE 4 CAPSULES (160 MG TOTAL) BY MOUTH DAILY. 120 capsule 4  . ondansetron (ZOFRAN) 8 MG tablet Take 1 tablet (8 mg total) by mouth every 8 (eight) hours as needed for nausea or vomiting. (Patient not taking: Reported on 11/29/2018) 20 tablet 0  . promethazine (PHENERGAN) 25 MG tablet Take 1 tablet (25 mg total) by mouth every 6 (six) hours as needed for nausea or vomiting. (Patient not taking: Reported on 11/29/2018) 30 tablet 0  . valACYclovir (VALTREX) 1000 MG tablet Take 1 tablet (1,000 mg total) by mouth 2 (two) times daily. (Patient not taking: Reported on 11/29/2018) 60 tablet 6   No current facility-administered medications for this visit.     PHYSICAL EXAMINATION: ECOG PERFORMANCE STATUS: 1 - Symptomatic but completely ambulatory  BP 130/78 (BP Location: Left Arm, Patient  Position: Sitting, Cuff Size: Normal)   Pulse (!) 57   Temp 97.6 F (36.4 C) (Tympanic)   Resp 20   Ht 6' (1.829 m)   Wt 160 lb (72.6 kg)   BMI 21.70 kg/m   Filed Weights   12/27/18 1037  Weight: 160 lb (72.6 kg)    Physical Exam  Constitutional: He is oriented to person, place, and time and well-developed, well-nourished, and in no distress.  Alone.  Walking by himself.  HENT:  Head: Normocephalic and atraumatic.  Mouth/Throat: Oropharynx is clear and moist. No oropharyngeal exudate.  Eyes: Pupils are equal, round, and reactive to light.  Neck: Normal range of motion. Neck supple.  Cardiovascular: Normal rate and regular rhythm.  Pulmonary/Chest: No respiratory distress. He has no wheezes.  Abdominal: Soft. Bowel sounds are normal. He exhibits no distension and no mass. There is no abdominal tenderness. There is no rebound and no guarding.  Musculoskeletal: Normal range of motion.        General: No tenderness or edema.  Neurological: He is alert and oriented to person, place, and time.  Skin: Skin is warm.  Psychiatric: Affect normal.       LABORATORY DATA:  I have reviewed the  data as listed    Component Value Date/Time   NA 140 12/27/2018 1018   NA 137 11/12/2014 1415   K 4.1 12/27/2018 1018   K 4.1 11/12/2014 1415   CL 105 12/27/2018 1018   CL 105 11/12/2014 1415   CO2 28 12/27/2018 1018   CO2 24 11/12/2014 1415   GLUCOSE 110 (H) 12/27/2018 1018   GLUCOSE 98 11/12/2014 1415   BUN 20 12/27/2018 1018   BUN 24 (H) 11/12/2014 1415   CREATININE 1.07 12/27/2018 1018   CREATININE 1.02 11/12/2014 1415   CALCIUM 9.4 12/27/2018 1018   CALCIUM 9.2 11/12/2014 1415   PROT 6.9 12/27/2018 1018   PROT 7.4 11/12/2014 1415   ALBUMIN 4.2 12/27/2018 1018   ALBUMIN 4.4 11/12/2014 1415   AST 36 12/27/2018 1018   AST 23 11/12/2014 1415   ALT 50 (H) 12/27/2018 1018   ALT 16 (L) 11/12/2014 1415   ALKPHOS 85 12/27/2018 1018   ALKPHOS 103 11/12/2014 1415   BILITOT 0.6  12/27/2018 1018   BILITOT 0.4 11/12/2014 1415   GFRNONAA >60 12/27/2018 1018   GFRNONAA >60 11/12/2014 1415   GFRAA >60 12/27/2018 1018   GFRAA >60 11/12/2014 1415    No results found for: SPEP, UPEP  Lab Results  Component Value Date   WBC 4.7 12/27/2018   NEUTROABS 2.3 12/27/2018   HGB 13.4 12/27/2018   HCT 39.7 12/27/2018   MCV 97.1 12/27/2018   PLT 185 12/27/2018      Chemistry      Component Value Date/Time   NA 140 12/27/2018 1018   NA 137 11/12/2014 1415   K 4.1 12/27/2018 1018   K 4.1 11/12/2014 1415   CL 105 12/27/2018 1018   CL 105 11/12/2014 1415   CO2 28 12/27/2018 1018   CO2 24 11/12/2014 1415   BUN 20 12/27/2018 1018   BUN 24 (H) 11/12/2014 1415   CREATININE 1.07 12/27/2018 1018   CREATININE 1.02 11/12/2014 1415      Component Value Date/Time   CALCIUM 9.4 12/27/2018 1018   CALCIUM 9.2 11/12/2014 1415   ALKPHOS 85 12/27/2018 1018   ALKPHOS 103 11/12/2014 1415   AST 36 12/27/2018 1018   AST 23 11/12/2014 1415   ALT 50 (H) 12/27/2018 1018   ALT 16 (L) 11/12/2014 1415   BILITOT 0.6 12/27/2018 1018   BILITOT 0.4 11/12/2014 1415       RADIOGRAPHIC STUDIES: I have personally reviewed the radiological images as listed and agreed with the findings in the report. No results found.   ASSESSMENT & PLAN:  Prostate cancer metastatic to multiple sites [facility]) # Castrate resistant prostate cancer-stage IV; on Trelstar q 36M [09/27/2018]; Currently on Xtandi-May 28th PET- Progression; clinically progressing/worsening.  PSA steadily rising at 13.   #Patient currently on Xtandi.  Clinically worsening.  #Again reviewed the multiple treatment options including systemic therapy/docetaxel/radium BRCA inhibitors.  Discussed pros and cons of each therapy.  Discussed without any treatment life expectancy is on the order of 6 to 12 months; and with treatments the median left expectancy is 12 to 24 months; provided patient proceeds with recommended treatments.  Patient has  been extremely reluctant with any treatments so far.  #Patient finally agrees to [place] today.  We will make a referral to Dr. [name].  Discussed the procedure; and also potential side effects.   #Bone mets/pain second malignancy--worsening- /X-geva q 4 weeks.    # DISPOSITION: # referral to Dr.Crystal re: Xofogo early next week.  #  x-geva today. # follow up in 4 weeks- MD/labs- cbc/cmp/PSA/ x-geva-Dr.B    Orders Placed This Encounter  Procedures  . CBC with Differential    Standing Status:   Future    Standing Expiration Date:   12/27/2019  . Comprehensive metabolic panel    Standing Status:   Future    Standing Expiration Date:   12/27/2019  . PSA    Standing Status:   Future    Standing Expiration Date:   12/27/2019   All questions were answered. The patient knows to call the clinic with any problems, questions or concerns.      William Sickle, MD 12/28/2018 6:18 AM

## 2019-01-02 ENCOUNTER — Ambulatory Visit
Admission: RE | Admit: 2019-01-02 | Discharge: 2019-01-02 | Disposition: A | Discharge status: Home / Self Care | Payer: Medicare Other | Source: Ambulatory Visit | Attending: Radiation Oncology | Admitting: Radiation Oncology

## 2019-01-02 ENCOUNTER — Other Ambulatory Visit: Payer: Self-pay

## 2019-01-02 ENCOUNTER — Encounter: Payer: Self-pay | Admitting: Radiation Oncology

## 2019-01-02 VITALS — BP 117/73 | HR 71 | Temp 98.1°F | Wt 163.8 lb

## 2019-01-02 DIAGNOSIS — C7951 Secondary malignant neoplasm of bone: Secondary | ICD-10-CM | POA: Diagnosis not present

## 2019-01-02 DIAGNOSIS — R972 Elevated prostate specific antigen [PSA]: Secondary | ICD-10-CM | POA: Diagnosis not present

## 2019-01-02 DIAGNOSIS — C61 Malignant neoplasm of prostate: Secondary | ICD-10-CM | POA: Insufficient documentation

## 2019-01-02 DIAGNOSIS — Z923 Personal history of irradiation: Secondary | ICD-10-CM | POA: Insufficient documentation

## 2019-01-02 DIAGNOSIS — Z192 Hormone resistant malignancy status: Secondary | ICD-10-CM | POA: Diagnosis not present

## 2019-01-02 NOTE — Progress Notes (Signed)
Radiation Oncology Follow up Note old patient new area progressive bone metastasis  Name: William Wright   Date:   01/02/2019 MRN:  [id number] DOB: 1951/01/30    This 68 y.o. male presents to the clinic today for progressive bone metastasis and narcotic dependent pain in patient with known hormone resistant stage IV prostate cancer.  REFERRING PROVIDER: Cammie Sickle, *  HPI: Patient is a.  69 year old male well-known to our department having been treated back in 2014 for Gleason 7 (4+3) adenocarcinoma stage IIb presenting with a PSA of 18.4.  He was lost to follow-up was seen again in May 2015 with stage IV disease and a PSA of 90 with extensive bone metastasis.  He was treated back in 2019 to T12-L5 for palliation.  He has been having increasing narcotic dependent pain.  Recent PET CT scan showed progressive disease with decreasing lesions in the region of the sacrum in the area which is causing him significant right sided hip pain.  He is referred for consideration of radium 223 treatment.  Except for the left hip pain he is complaining of no significant pain other areas.  He is ambulating without assistance.  COMPLICATIONS OF TREATMENT: none  FOLLOW UP COMPLIANCE: keeps appointments   PHYSICAL EXAM:  BP 117/73   Pulse 71   Temp 98.1 F (36.7 C)   Wt 163 lb 12.8 oz (74.3 kg)   BMI 22.22 kg/m  Deep palpation of the spine does not elicit pain range of motion of his left lower extremity does not elicit pain.  Well-developed well-nourished patient in NAD. HEENT reveals PERLA, EOMI, discs not visualized.  Oral cavity is clear. No oral mucosal lesions are identified. Neck is clear without evidence of cervical or supraclavicular adenopathy. Lungs are clear to A&P. Cardiac examination is essentially unremarkable with regular rate and rhythm without murmur rub or thrill. Abdomen is benign with no organomegaly or masses noted. Motor sensory and DTR levels are equal and symmetric in the  upper and lower extremities. Cranial nerves II through XII are grossly intact. Proprioception is intact. No peripheral adenopathy or edema is identified. No motor or sensory levels are noted. Crude visual fields are within normal range.  RADIOLOGY RESULTS: PET CT scan reviewed compatible with above-stated findings  PLAN: At this time I believe the patient is a good candidate for radium 223.  We are proceeding to make arrangements for his first infusion in about a month's time.  I like in the meantime to go ahead with SBRT to his left pelvic lesions by PET/CT criteria.  Would plan on delivering 3000 cGy in 5 fractions.  This is near previous area of treatment and I believe SBRT would be the best and most effective way of treating this close approximation of previously radiated fields.  Also this will give me the ability to spare bowel and unnecessary GI toxicity.  Patient comprehends my treatment plan well.  I have personally set up and ordered CT simulation for next week.  We also in the process of scheduling his radium-223 treatments.  I would like to take this opportunity to thank you for allowing me to participate in the care of your patient.Noreene Filbert, MD

## 2019-01-08 ENCOUNTER — Other Ambulatory Visit: Payer: Self-pay

## 2019-01-08 ENCOUNTER — Ambulatory Visit: Payer: Medicare Other

## 2019-01-09 ENCOUNTER — Ambulatory Visit
Admission: RE | Admit: 2019-01-09 | Discharge: 2019-01-09 | Disposition: A | Discharge status: Home / Self Care | Payer: Medicare Other | Source: Ambulatory Visit | Attending: Radiation Oncology | Admitting: Radiation Oncology

## 2019-01-09 ENCOUNTER — Other Ambulatory Visit: Payer: Self-pay

## 2019-01-09 DIAGNOSIS — C61 Malignant neoplasm of prostate: Secondary | ICD-10-CM | POA: Insufficient documentation

## 2019-01-09 DIAGNOSIS — C7931 Secondary malignant neoplasm of brain: Secondary | ICD-10-CM | POA: Insufficient documentation

## 2019-01-09 DIAGNOSIS — Z510 Encounter for antineoplastic radiation therapy: Secondary | ICD-10-CM | POA: Insufficient documentation

## 2019-01-09 DIAGNOSIS — C7951 Secondary malignant neoplasm of bone: Secondary | ICD-10-CM | POA: Diagnosis not present

## 2019-01-10 DIAGNOSIS — C61 Malignant neoplasm of prostate: Secondary | ICD-10-CM | POA: Diagnosis not present

## 2019-01-10 DIAGNOSIS — C7951 Secondary malignant neoplasm of bone: Secondary | ICD-10-CM | POA: Diagnosis not present

## 2019-01-10 DIAGNOSIS — C7931 Secondary malignant neoplasm of brain: Secondary | ICD-10-CM | POA: Diagnosis not present

## 2019-01-10 DIAGNOSIS — Z510 Encounter for antineoplastic radiation therapy: Secondary | ICD-10-CM | POA: Diagnosis not present

## 2019-01-13 ENCOUNTER — Telehealth: Payer: Self-pay

## 2019-01-13 NOTE — Telephone Encounter (Signed)
Telephone call to patient to schedule palliative care viist.  Patient in agreement with telehealth visit on 01/15/19 at 10:00.

## 2019-01-14 MED FILL — XTANDI 40 MG CAPSULE: 40 | 30 days supply | Qty: 120 | Fill #1

## 2019-01-15 ENCOUNTER — Other Ambulatory Visit: Payer: Self-pay

## 2019-01-15 ENCOUNTER — Other Ambulatory Visit: Payer: Self-pay | Admitting: Internal Medicine

## 2019-01-15 ENCOUNTER — Other Ambulatory Visit: Payer: Medicare Other

## 2019-01-15 DIAGNOSIS — Z515 Encounter for palliative care: Secondary | ICD-10-CM

## 2019-01-15 NOTE — Progress Notes (Signed)
COMMUNITY PALLIATIVE CARE SW NOTE  PATIENT NAME: William Wright DOB: 01-25-1951 MRN: [id number]  PRIMARY CARE PROVIDER: Cammie Sickle, MD  RESPONSIBLE PARTY:  Acct ID - Guarantor Home Phone Work Phone Relationship Acct Type  [id number] [name]  Self P/F     [address] [place], [place] [address]     PLAN OF CARE and INTERVENTIONS:             1. GOALS OF CARE/ ADVANCE CARE PLANNING:  Patient wishes to remain at home and continue to do things that he enjoys. Patient has living will and HCPOA on file. 2. SOCIAL/EMOTIONAL/SPIRITUAL ASSESSMENT/ INTERVENTIONS:  SW and RN met with patient via TELEHEALTH. Patient reports doing "good". Patient discussed his recent trip to [place] to see the [place] with his grandchildren. Patient said he has decided to do radiation. Patient is nervous about this but is hopeful. Patient said that his pain is managed overall. Denies any concerns. Patient is sleeping and eating well. 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 Patient is also looking forward to spending his birthday at the beach.  3. PATIENT/CAREGIVER EDUCATION/ COPING:  Patient continues to maintain positive outlook. Patient has anxiety about starting radiation treatment but is focused on remaining hopeful.  4. PERSONAL EMERGENCY PLAN:  Patient/family would call 9-1-1 in case of emergencies. Due to COVID-19, patient verbalized that he is social distancing.  5. COMMUNITY RESOURCES COORDINATION/ HEALTH CARE NAVIGATION:  Patient is scheduled to begin radiation on 7/13 - it will be twice a week. After radiation is completed, the plan would be to do monthly radiation injections. Patient continues to manage his care with support of family.  6. FINANCIAL/LEGAL CONCERNS/INTERVENTIONS:  None.     SOCIAL HX:  Social History   Tobacco Use  . Smoking status: Never Smoker  . Smokeless tobacco: Never Used  Substance Use  Topics  . Alcohol use: No    CODE STATUS:   Code Status: Not on file  ADVANCED DIRECTIVES: Y MOST FORM COMPLETE: No. HOSPICE EDUCATION PROVIDED: None.  PPS: Patient is independent of ADLs.  Due to the COVID-19 crisis, this visit was done viaZoomfrom my office and it was initiated and consent by this patient and or family.This was a scheduled visit.  I spent30 minutes with this patient [id number] education, consultation and support.   Margaretmary Lombard, LCSW

## 2019-01-15 NOTE — Progress Notes (Signed)
PATIENT NAME: William Wright DOB: 1950/07/27 MRN: [id number]  PRIMARY CARE PROVIDER: Cammie Sickle, MD  RESPONSIBLE PARTY:  Acct ID - Guarantor Home Phone Work Phone Relationship Acct Type  [id number] William [phone]  Self P/F     [address], [place], [place] [address]    PLAN OF CARE and INTERVENTIONS:               1.  GOALS OF CARE/ ADVANCE CARE PLANNING: Patient wishes to remain home with his family.  Patient is hoping he will tolerate radiation treatments without side effects.                2.  PATIENT/CAREGIVER EDUCATION: Education on s/s of infection, education to patient on pain meds and side effects.                3.  DISEASE STATUS: Patient is a 68 year old with prostate cancer with mets.  Patient lives in home with [name].   Due to the COVID-19 crisis, this visit was done via telemedicine from my office and it was initiated and consent by this patient and or family. SW and RN met with patient for telehealth visit.  Patient smiling and pleasant.  Patient updated SW and RN on his plan of treatment at cancer center due to progression of prostate cancer.  Patient to receive 5 rounds of radiation beginning 01-27-19.  Patient then to start monthly radiation injections.  Patient reports he feels anxious and states every time he received radiation in the past he "regretted it."  Patient having increased pain in his left leg and lower back. Patient reports he has begin taking Neurontin at bedtime and feels Neurontin is helping with pain.  Patient's PSA has risen from 13 to 73.  Patient also having increased fatigue.  Patient denies having any falls.  Patient's appetite is good.  Patient denies having any nausea or vomiting.  Patient denies having any cough or shortness of breath. Patient has no edema in his lower extremities.  Patient has been sleeping well.  Patient planning on going to the beach next week.  Patient talks about taking family to [place] to tour replica of  William Wright.  Patient appreciative of telehealth visit.  Patient denies having any needs at the present time.  Patient informed to call with questions or concerns.           HISTORY OF PRESENT ILLNESS:    CODE STATUS: Full Code  ADVANCED DIRECTIVES: Y MOST FORM: No  PPS: 60%   PHYSICAL EXAM:   VITALS: Telehealth visit / no vital signs taken   LUNGS: patient denies cough or shortness of breath CARDIAC: EXTREMITIES:  none edema SKIN: Patient denies having any open areas of skin breakdown    NEURO: Alert and oriented x 4       William Simmer, RN

## 2019-01-20 ENCOUNTER — Ambulatory Visit: Payer: Medicare Other

## 2019-01-22 ENCOUNTER — Ambulatory Visit: Payer: Medicare Other

## 2019-01-23 ENCOUNTER — Ambulatory Visit: Payer: Medicare Other

## 2019-01-24 ENCOUNTER — Ambulatory Visit: Payer: Medicare Other

## 2019-01-24 ENCOUNTER — Other Ambulatory Visit: Payer: Self-pay

## 2019-01-24 ENCOUNTER — Ambulatory Visit: Payer: Medicare Other | Admitting: Internal Medicine

## 2019-01-24 ENCOUNTER — Other Ambulatory Visit: Payer: Medicare Other

## 2019-01-25 ENCOUNTER — Emergency Department: Payer: Medicare Other

## 2019-01-25 ENCOUNTER — Inpatient Hospital Stay
Admission: EM | Admit: 2019-01-25 | Discharge: 2019-01-27 | DRG: 055 | Disposition: A | Discharge status: Home / Self Care | Payer: Medicare Other | Attending: Internal Medicine | Admitting: Internal Medicine

## 2019-01-25 ENCOUNTER — Other Ambulatory Visit: Payer: Self-pay

## 2019-01-25 DIAGNOSIS — M6281 Muscle weakness (generalized): Secondary | ICD-10-CM | POA: Diagnosis not present

## 2019-01-25 DIAGNOSIS — Z1159 Encounter for screening for other viral diseases: Secondary | ICD-10-CM

## 2019-01-25 DIAGNOSIS — Z20828 Contact with and (suspected) exposure to other viral communicable diseases: Secondary | ICD-10-CM | POA: Diagnosis not present

## 2019-01-25 DIAGNOSIS — Z820 Family history of epilepsy and other diseases of the nervous system: Secondary | ICD-10-CM

## 2019-01-25 DIAGNOSIS — C7931 Secondary malignant neoplasm of brain: Secondary | ICD-10-CM | POA: Diagnosis not present

## 2019-01-25 DIAGNOSIS — Z806 Family history of leukemia: Secondary | ICD-10-CM | POA: Diagnosis not present

## 2019-01-25 DIAGNOSIS — Z801 Family history of malignant neoplasm of trachea, bronchus and lung: Secondary | ICD-10-CM

## 2019-01-25 DIAGNOSIS — R531 Weakness: Secondary | ICD-10-CM | POA: Diagnosis not present

## 2019-01-25 DIAGNOSIS — I4510 Unspecified right bundle-branch block: Secondary | ICD-10-CM | POA: Diagnosis not present

## 2019-01-25 DIAGNOSIS — Z825 Family history of asthma and other chronic lower respiratory diseases: Secondary | ICD-10-CM

## 2019-01-25 DIAGNOSIS — C7951 Secondary malignant neoplasm of bone: Secondary | ICD-10-CM | POA: Diagnosis present

## 2019-01-25 DIAGNOSIS — J341 Cyst and mucocele of nose and nasal sinus: Secondary | ICD-10-CM | POA: Diagnosis not present

## 2019-01-25 DIAGNOSIS — C61 Malignant neoplasm of prostate: Secondary | ICD-10-CM | POA: Diagnosis present

## 2019-01-25 DIAGNOSIS — R220 Localized swelling, mass and lump, head: Secondary | ICD-10-CM | POA: Diagnosis not present

## 2019-01-25 DIAGNOSIS — G930 Cerebral cysts: Secondary | ICD-10-CM | POA: Diagnosis not present

## 2019-01-25 DIAGNOSIS — M545 Low back pain: Secondary | ICD-10-CM | POA: Diagnosis not present

## 2019-01-25 DIAGNOSIS — R4182 Altered mental status, unspecified: Secondary | ICD-10-CM | POA: Diagnosis not present

## 2019-01-25 LAB — COMPREHENSIVE METABOLIC PANEL
ALT: 11 U/L (ref 0–44)
AST: 16 U/L (ref 15–41)
Albumin: 4.2 g/dL (ref 3.5–5.0)
Alkaline Phosphatase: 58 U/L (ref 38–126)
Anion gap: 9 (ref 5–15)
BUN: 27 mg/dL — ABNORMAL HIGH (ref 8–23)
CO2: 25 mmol/L (ref 22–32)
Calcium: 9.2 mg/dL (ref 8.9–10.3)
Chloride: 107 mmol/L (ref 98–111)
Creatinine, Ser: 0.86 mg/dL (ref 0.61–1.24)
GFR calc Af Amer: >60 mL/min (ref >60)
GFR calc non Af Amer: >60 mL/min (ref >60)
Glucose, Bld: 112 mg/dL — ABNORMAL HIGH (ref 70–99)
Potassium: 4 mmol/L (ref 3.5–5.1)
Sodium: 141 mmol/L (ref 135–145)
Total Bilirubin: 0.5 mg/dL (ref 0.3–1.2)
Total Protein: 6.9 g/dL (ref 6.5–8.1)

## 2019-01-25 LAB — DIFFERENTIAL
Abs Immature Granulocytes: 0.02 10*3/uL (ref 0.00–0.07)
Basophils Absolute: 0 10*3/uL (ref 0.0–0.1)
Basophils Relative: 0 %
Eosinophils Absolute: 0.3 10*3/uL (ref 0.0–0.5)
Eosinophils Relative: 7 %
Immature Granulocytes: 0 %
Lymphocytes Relative: 24 %
Lymphs Abs: 1.2 10*3/uL (ref 0.7–4.0)
Monocytes Absolute: 0.5 10*3/uL (ref 0.1–1.0)
Monocytes Relative: 10 %
Neutro Abs: 2.9 10*3/uL (ref 1.7–7.7)
Neutrophils Relative %: 59 %

## 2019-01-25 LAB — CBC
HCT: 38.8 % — ABNORMAL LOW (ref 39.0–52.0)
Hemoglobin: 12.6 g/dL — ABNORMAL LOW (ref 13.0–17.0)
MCH: 32.6 pg (ref 26.0–34.0)
MCHC: 32.5 g/dL (ref 30.0–36.0)
MCV: 100.5 fL — ABNORMAL HIGH (ref 80.0–100.0)
Platelets: 185 10*3/uL (ref 150–400)
RBC: 3.86 MIL/uL — ABNORMAL LOW (ref 4.22–5.81)
RDW: 13.3 % (ref 11.5–15.5)
WBC: 5 10*3/uL (ref 4.0–10.5)
nRBC: 0 % (ref 0.0–0.2)

## 2019-01-25 LAB — PROTIME-INR
INR: 1 (ref 0.8–1.2)
Prothrombin Time: 12.8 seconds (ref 11.4–15.2)

## 2019-01-25 LAB — APTT: aPTT: 27 seconds (ref 24–36)

## 2019-01-25 MED ORDER — SODIUM CHLORIDE 0.9% FLUSH: 3.0000 mL | Freq: Once | INTRAVENOUS | Status: DC

## 2019-01-25 MED ORDER — DEXAMETHASONE SODIUM PHOSPHATE 10 MG/ML IJ SOLN
10.0000 mg | Freq: Once | INTRAMUSCULAR | Status: AC
  Administered 2019-01-25: 10 mg via INTRAVENOUS
  Filled 2019-01-25: qty 1

## 2019-01-25 NOTE — ED Triage Notes (Signed)
Reports symptoms were noticed past Monday worse today.  Wife reported noticed problems with memory and difficulty with daily adls (getting dressed).  Patient reports feeling weak on left side.

## 2019-01-25 NOTE — ED Provider Notes (Signed)
[facility] Emergency Department Provider Note  Time seen: 10:18 PM  I have reviewed the triage vital signs and the nursing notes.   HISTORY  Chief Complaint Left arm weakness  HPI William Wright is a 68 y.o. male with a past medical history of prostate cancer on oral chemotherapy presents to the emergency department for left arm weakness.  According to the patient over the past 3 weeks he has been experiencing weakness of the left upper extremity.  Patient denies any significant worsening of the weakness but states his wife made him come in to get evaluated, because she was concerned that he could be having a stroke.  Patient denies any weakness or numbness of any other extremity.  Denies any slurred speech.  States he has noticed some slowing in his ability to think, but thought that was due to the oral chemotherapy.   Past Medical History:  Diagnosis Date  . Cancer of prostate ([place]) 07/2012   OraL chemo pill.   . Family history of leukemia     Patient Active Problem List   Diagnosis Date Noted  . Goals of care, counseling/discussion 03/20/2018  . Swelling of limb 12/21/2017  . Genetic testing 11/30/2017  . Family history of leukemia   . Thoracic aortic aneurysm without rupture ([place]) 12/22/2016  . Prostate cancer metastatic to multiple sites ([place]) 01/19/2016  . Cancer of prostate ([place]) 11/18/2014    Past Surgical History:  Procedure Laterality Date  . EXTRACORPOREAL SHOCK WAVE LITHOTRIPSY Left 05/20/2015   Procedure: EXTRACORPOREAL SHOCK WAVE LITHOTRIPSY (ESWL);  Surgeon: Royston Cowper, MD;  Location: [facility] ORS;  Service: Urology;  Laterality: Left;  . EXTRACORPOREAL SHOCK WAVE LITHOTRIPSY Right 12/21/2016   Procedure: EXTRACORPOREAL SHOCK WAVE LITHOTRIPSY (ESWL);  Surgeon: Royston Cowper, MD;  Location: [facility] ORS;  Service: Urology;  Laterality: Right;  . IR RADIOLOGIST EVAL & MGMT  02/28/2018    Prior to Admission medications   Medication Sig  Start Date End Date Taking? Authorizing Provider  gabapentin (NEURONTIN) 100 MG capsule Take 1 capsule (100 mg total) by mouth 3 (three) times daily. 11/29/18   Cammie Sickle, MD  HYDROcodone-acetaminophen (NORCO/VICODIN) 5-325 MG tablet Take 1 tablet by mouth every 6 (six) hours as needed for moderate pain.    [provider]  ondansetron (ZOFRAN) 8 MG tablet Take 1 tablet (8 mg total) by mouth every 8 (eight) hours as needed for nausea or vomiting. Patient not taking: Reported on 11/29/2018 04/12/18   Cammie Sickle, MD  oxybutynin (DITROPAN-XL) 5 MG 24 hr tablet Take 1 tablet (5 mg total) by mouth at bedtime. 01/11/18   Cammie Sickle, MD  promethazine (PHENERGAN) 25 MG tablet Take 1 tablet (25 mg total) by mouth every 6 (six) hours as needed for nausea or vomiting. Patient not taking: Reported on 11/29/2018 03/26/18   Noreene Filbert, MD  tamsulosin (FLOMAX) 0.4 MG CAPS capsule TAKE 1 CAPSULE BY MOUTH DAILY 01/15/19   Cammie Sickle, MD  traMADol (ULTRAM) 50 MG tablet Take 1 tablet (50 mg total) by mouth every 8 (eight) hours as needed. 12/02/18   Cammie Sickle, MD  Triptorelin Pamoate (TRELSTAR) 22.5 MG injection Inject 22.5 mg into the muscle every 6 (six) months.    [provider]  valACYclovir (VALTREX) 1000 MG tablet Take 1 tablet (1,000 mg total) by mouth 2 (two) times daily. Patient not taking: Reported on 11/29/2018 08/02/16   Cammie Sickle, MD  XTANDI 40 MG capsule  TAKE 4 CAPSULES (160 MG TOTAL) BY MOUTH DAILY. 12/16/18   Cammie Sickle, MD    Allergies  Allergen Reactions  . No Known Allergies     Family History  Problem Relation Age of Onset  . Alzheimer's disease Father   . Lung cancer Maternal Grandfather 34  . Leukemia Paternal Grandmother 32  . COPD Paternal Grandfather   . Cancer Maternal Uncle        type unk, dx 24's    Social History Social History   Tobacco Use  . Smoking status: Never Smoker  .  Smokeless tobacco: Never Used  Substance Use Topics  . Alcohol use: No  . Drug use: No    Review of Systems Constitutional: Negative for fever. Cardiovascular: Negative for chest pain. Respiratory: Negative for shortness of breath. for cough. Gastrointestinal: Negative for abdominal pain, vomiting Genitourinary: Negative for urinary compaints Musculoskeletal: Negative for musculoskeletal complaints Skin: Negative for skin complaints  Neurological: Negative for headache.  Left arm weakness.   All other ROS negative  ____________________________________________   PHYSICAL EXAM:  VITAL SIGNS: ED Triage Vitals  Enc Vitals Group     BP 01/25/19 2021 (!) 146/84     Pulse Rate 01/25/19 2021 75     Resp 01/25/19 2021 18     Temp 01/25/19 2021 99.3 F (37.4 C)     Temp Source 01/25/19 2021 Oral     SpO2 01/25/19 2021 100 %     Weight 01/25/19 2020 160 lb (72.6 kg)     Height 01/25/19 2020 6' (1.829 m)     Head Circumference --      Peak Flow --      Pain Score 01/25/19 2020 4     Pain Loc --      Pain Edu? --      Excl. in [place]? --    Constitutional: Alert and oriented. Well appearing and in no distress. Eyes: Normal exam ENT      Head: Normocephalic and atraumatic.      Mouth/Throat: Mucous membranes are moist. Cardiovascular: Normal rate, regular rhythm. Respiratory: Normal respiratory effort without tachypnea nor retractions. Breath sounds are clear Gastrointestinal: Soft and nontender. No distention.  Musculoskeletal: Nontender with normal range of motion in all extremities. Neurologic:  Normal speech and language.  Patient has 4/5 strength in left upper extremity compared to 5/5 in all other extremities.  Positive for left upper extremity pronator drift. Skin:  Skin is warm, dry and intact.  Psychiatric: Mood and affect are normal.   ____________________________________________    EKG  EKG viewed and interpreted by myself shows a normal sinus rhythm at 71 bpm with  a narrow QRS, normal axis, normal intervals, no concerning ST changes.  ____________________________________________    RADIOLOGY  CT scan of the brain shows 2 cystic lesions in the right frontal lobe with surrounding edema.  ____________________________________________   INITIAL IMPRESSION / ASSESSMENT AND PLAN / ED COURSE  Pertinent labs & imaging results that were available during my care of the patient were reviewed by me and considered in my medical decision making (see chart for details).   Patient presents to the emergency department for 3 weeks of left upper extremity weakness.  Patient has a history of prostate cancer currently on oral chemotherapy.  Differential at this time would include metastatic disease, CVA, peripheral neuropathy.  We will check labs, CT scan and continue to closely monitor. CT unfortunately shows 2 large cystic lesions in the frontal lobe with  surrounding edema concerning for possible metastatic spread.  We will obtain an MRI to further evaluate.  I discussed patient with Dr. Grayland Ormond, pending no concerning MRI results we will likely discharge on steroids and have the patient follow-up on Monday with Dr. Owens Shark Monday for arrangement of radiation oncology evaluation.  Lab work is largely [place].  MRI pending.  Patient care signed out to oncoming physician.  LANGFORD CARIAS was evaluated in Emergency Department on 01/25/2019 for the symptoms described in the history of present illness. He was evaluated in the context of the global COVID-19 pandemic, which necessitated consideration that the patient might be at risk for infection with the SARS-CoV-2 virus that causes COVID-19. Institutional protocols and algorithms that pertain to the evaluation of patients at risk for COVID-19 are in a state of rapid change based on information released by regulatory bodies including the CDC and federal and state organizations. These policies and algorithms were followed  during the patient's care in the ED.  ____________________________________________   FINAL CLINICAL IMPRESSION(S) / ED DIAGNOSES  Brain metastases    Harvest Dark, MD 01/25/19 2228

## 2019-01-25 NOTE — ED Notes (Signed)
Wife arrives with pt, who is ambulatory with steady gait, saying the thinks he's had a stroke and she'd like him evaluated; pt assisted to wheelchair; she says some symptoms started about 4 weeks ago-some memory issues and weakness with his left hand; this past Monday she noticed him having difficulty dressing himself and he had some left sided facial drooping; that seemed to improve in the past few days; she said last night her daughter thought she noticed the left sided facial drooping again but none visualized at this time at stat registration; pt talking in complete coherent sentences; says he feels fine other than some weakness in the left hand;

## 2019-01-26 ENCOUNTER — Emergency Department: Payer: Medicare Other

## 2019-01-26 DIAGNOSIS — C7951 Secondary malignant neoplasm of bone: Secondary | ICD-10-CM | POA: Diagnosis present

## 2019-01-26 DIAGNOSIS — J341 Cyst and mucocele of nose and nasal sinus: Secondary | ICD-10-CM | POA: Diagnosis not present

## 2019-01-26 DIAGNOSIS — C61 Malignant neoplasm of prostate: Secondary | ICD-10-CM | POA: Diagnosis present

## 2019-01-26 DIAGNOSIS — R531 Weakness: Secondary | ICD-10-CM | POA: Diagnosis not present

## 2019-01-26 DIAGNOSIS — R220 Localized swelling, mass and lump, head: Secondary | ICD-10-CM | POA: Diagnosis not present

## 2019-01-26 DIAGNOSIS — Z801 Family history of malignant neoplasm of trachea, bronchus and lung: Secondary | ICD-10-CM | POA: Diagnosis not present

## 2019-01-26 DIAGNOSIS — G930 Cerebral cysts: Secondary | ICD-10-CM | POA: Diagnosis not present

## 2019-01-26 DIAGNOSIS — Z825 Family history of asthma and other chronic lower respiratory diseases: Secondary | ICD-10-CM | POA: Diagnosis not present

## 2019-01-26 DIAGNOSIS — R410 Disorientation, unspecified: Secondary | ICD-10-CM | POA: Diagnosis not present

## 2019-01-26 DIAGNOSIS — R569 Unspecified convulsions: Secondary | ICD-10-CM | POA: Diagnosis not present

## 2019-01-26 DIAGNOSIS — C7931 Secondary malignant neoplasm of brain: Secondary | ICD-10-CM | POA: Diagnosis present

## 2019-01-26 DIAGNOSIS — Z820 Family history of epilepsy and other diseases of the nervous system: Secondary | ICD-10-CM | POA: Diagnosis not present

## 2019-01-26 DIAGNOSIS — Z1159 Encounter for screening for other viral diseases: Secondary | ICD-10-CM | POA: Diagnosis not present

## 2019-01-26 DIAGNOSIS — Z806 Family history of leukemia: Secondary | ICD-10-CM | POA: Diagnosis not present

## 2019-01-26 LAB — TSH: TSH: 2.514 u[IU]/mL (ref 0.350–4.500)

## 2019-01-26 LAB — GLUCOSE, CAPILLARY
Glucose-Capillary: 128 mg/dL — ABNORMAL HIGH (ref 70–99)
Glucose-Capillary: 124 mg/dL — ABNORMAL HIGH (ref 70–99)

## 2019-01-26 LAB — SARS CORONAVIRUS 2 BY RT PCR (HOSPITAL ORDER, PERFORMED IN ~~LOC~~ HOSPITAL LAB): SARS Coronavirus 2: NEGATIVE

## 2019-01-26 MED ORDER — DEXAMETHASONE SODIUM PHOSPHATE 10 MG/ML IJ SOLN
10.0000 mg | Freq: Once | INTRAMUSCULAR | Status: AC
  Administered 2019-01-26: 10 mg via INTRAVENOUS

## 2019-01-26 MED ORDER — ACETAMINOPHEN 325 MG PO TABS: 650.0000 mg | ORAL_TABLET | Freq: Four times a day (QID) | ORAL | Status: DC | PRN

## 2019-01-26 MED ORDER — ONDANSETRON HCL 4 MG PO TABS: 4.0000 mg | ORAL_TABLET | Freq: Four times a day (QID) | ORAL | Status: DC | PRN

## 2019-01-26 MED ORDER — GADOBUTROL 1 MMOL/ML IV SOLN
7.0000 mL | Freq: Once | INTRAVENOUS | Status: AC | PRN
  Administered 2019-01-26: 7 mL via INTRAVENOUS

## 2019-01-26 MED ORDER — INSULIN ASPART 100 UNIT/ML ~~LOC~~ SOLN: 0.0000 [IU] | Freq: Every day | SUBCUTANEOUS | Status: DC

## 2019-01-26 MED ORDER — ONDANSETRON HCL 4 MG/2ML IJ SOLN: 4.0000 mg | Freq: Four times a day (QID) | INTRAMUSCULAR | Status: DC | PRN

## 2019-01-26 MED ORDER — INSULIN ASPART 100 UNIT/ML ~~LOC~~ SOLN
0.0000 [IU] | Freq: Three times a day (TID) | SUBCUTANEOUS | Status: DC
  Administered 2019-01-26 – 2019-01-27 (×2): 1 [IU] via SUBCUTANEOUS
  Filled 2019-01-26 (×2): qty 1

## 2019-01-26 MED ORDER — DEXAMETHASONE SODIUM PHOSPHATE 10 MG/ML IJ SOLN
INTRAMUSCULAR | Status: AC
  Filled 2019-01-26: qty 1

## 2019-01-26 MED ORDER — ACETAMINOPHEN 650 MG RE SUPP: 650.0000 mg | Freq: Four times a day (QID) | RECTAL | Status: DC | PRN

## 2019-01-26 MED ORDER — DOCUSATE SODIUM 100 MG PO CAPS
100.0000 mg | ORAL_CAPSULE | Freq: Two times a day (BID) | ORAL | Status: DC
  Administered 2019-01-26 – 2019-01-27 (×3): 100 mg via ORAL
  Filled 2019-01-26 (×3): qty 1

## 2019-01-26 MED ORDER — ALUM & MAG HYDROXIDE-SIMETH 200-200-20 MG/5ML PO SUSP
30.0000 mL | Freq: Once | ORAL | Status: AC
  Administered 2019-01-26: 30 mL via ORAL
  Filled 2019-01-26: qty 30

## 2019-01-26 MED ORDER — DEXAMETHASONE SODIUM PHOSPHATE 4 MG/ML IJ SOLN
4.0000 mg | Freq: Four times a day (QID) | INTRAMUSCULAR | Status: DC
  Administered 2019-01-26 – 2019-01-27 (×3): 4 mg via INTRAVENOUS
  Filled 2019-01-26 (×3): qty 1

## 2019-01-26 MED ORDER — DEXAMETHASONE SODIUM PHOSPHATE 4 MG/ML IJ SOLN
4.0000 mg | Freq: Four times a day (QID) | INTRAMUSCULAR | Status: DC
  Filled 2019-01-26: qty 1

## 2019-01-26 NOTE — ED Notes (Signed)
Dr Diamond at bedside. 

## 2019-01-26 NOTE — ED Notes (Signed)
Patient up to bathroom

## 2019-01-26 NOTE — ED Notes (Signed)
Per Colletta Maryland, charge RN- the floor is not taking the pt until they discharge another pt

## 2019-01-26 NOTE — Plan of Care (Signed)
Pt admitted today from the ED. VSS. A&Ox4.  Slightly forgetful. Pt reported mild back pain, declined an intervention.

## 2019-01-26 NOTE — ED Notes (Signed)
Pt given meal tray and given update on admission

## 2019-01-26 NOTE — Progress Notes (Signed)
The patient is alert, awake and oriented x3 now. He has no complaints.  Vital signs, imaging and labs reviewed. Physical examinations done.  Continue current treatment.

## 2019-01-26 NOTE — ED Notes (Signed)
Pt given cup of water 

## 2019-01-26 NOTE — H&P (Signed)
William Wright is an 68 y.o. male.    Chief Complaint: Left-sided weakness HPI: The patient with past medical history of prostate cancer with metastases to the spine presents to the emergency department complaining of left-sided weakness.  The patient's wife states that he was also intermittently confused.  CT of his head revealed cystic lesions within the right frontal lobe with associated mass-effect.  He was given Decadron IV in the emergency department prior to the hospitalist service being called for admission.  Past Medical History:  Diagnosis Date  . Cancer of prostate ([place]) 07/2012   OraL chemo pill.   . Family history of leukemia     Past Surgical History:  Procedure Laterality Date  . EXTRACORPOREAL SHOCK WAVE LITHOTRIPSY Left 05/20/2015   Procedure: EXTRACORPOREAL SHOCK WAVE LITHOTRIPSY (ESWL);  Surgeon: Royston Cowper, MD;  Location: [facility] ORS;  Service: Urology;  Laterality: Left;  . EXTRACORPOREAL SHOCK WAVE LITHOTRIPSY Right 12/21/2016   Procedure: EXTRACORPOREAL SHOCK WAVE LITHOTRIPSY (ESWL);  Surgeon: Royston Cowper, MD;  Location: [facility] ORS;  Service: Urology;  Laterality: Right;  . IR RADIOLOGIST EVAL & MGMT  02/28/2018    Family History  Problem Relation Age of Onset  . Alzheimer's disease Father   . Lung cancer Maternal Grandfather 21  . Leukemia Paternal Grandmother 92  . COPD Paternal Grandfather   . Cancer Maternal Uncle        type unk, dx 61's   Social History:  reports that he has never smoked. He has never used smokeless tobacco. He reports that he does not drink alcohol or use drugs.  Allergies:  Allergies  Allergen Reactions  . No Known Allergies     (Not in a hospital admission)   Results for orders placed or performed during the hospital encounter of 01/25/19 (from the past 48 hour(s))  Protime-INR     Status: None   Collection Time: 01/25/19  8:33 PM  Result Value Ref Range   Prothrombin Time 12.8 11.4 - 15.2 seconds   INR 1.0 0.8 - 1.2     Comment: (NOTE) INR goal varies based on device and disease states. Performed at [facility], [address] [place], [place] [address]   APTT     Status: None   Collection Time: 01/25/19  8:33 PM  Result Value Ref Range   aPTT 27 24 - 36 seconds    Comment: Performed at [facility], [address] [place], [place] [address]  CBC     Status: Abnormal   Collection Time: 01/25/19  8:33 PM  Result Value Ref Range   WBC 5.0 4.0 - 10.5 K/uL   RBC 3.86 (L) 4.22 - 5.81 MIL/uL   Hemoglobin 12.6 (L) 13.0 - 17.0 g/dL   HCT 38.8 (L) 39.0 - 52.0 %   MCV 100.5 (H) 80.0 - 100.0 fL   MCH 32.6 26.0 - 34.0 pg   MCHC 32.5 30.0 - 36.0 g/dL   RDW 13.3 11.5 - 15.5 %   Platelets 185 150 - 400 K/uL   nRBC 0.0 0.0 - 0.2 %    Comment: Performed at [facility], [address] [place], [place] [address]  Differential     Status: None   Collection Time: 01/25/19  8:33 PM  Result Value Ref Range   Neutrophils Relative % 59 %   Neutro Abs 2.9 1.7 - 7.7 K/uL   Lymphocytes Relative 24 %   Lymphs Abs 1.2 0.7 - 4.0 K/uL  Monocytes Relative 10 %   Monocytes Absolute 0.5 0.1 - 1.0 K/uL   Eosinophils Relative 7 %   Eosinophils Absolute 0.3 0.0 - 0.5 K/uL   Basophils Relative 0 %   Basophils Absolute 0.0 0.0 - 0.1 K/uL   Immature Granulocytes 0 %   Abs Immature Granulocytes 0.02 0.00 - 0.07 K/uL    Comment: Performed at [facility], [address] [place], [place] [address]  Comprehensive metabolic panel     Status: Abnormal   Collection Time: 01/25/19  8:33 PM  Result Value Ref Range   Sodium 141 135 - 145 mmol/L   Potassium 4.0 3.5 - 5.1 mmol/L   Chloride 107 98 - 111 mmol/L   CO2 25 22 - 32 mmol/L   Glucose, Bld 112 (H) 70 - 99 mg/dL   BUN 27 (H) 8 - 23 mg/dL   Creatinine, Ser 0.86 0.61 - 1.24 mg/dL   Calcium 9.2 8.9 - 10.3 mg/dL   Total Protein 6.9 6.5 - 8.1 g/dL   Albumin 4.2 3.5 - 5.0 g/dL   AST 16 15 - 41 U/L   ALT 11 0 - 44 U/L   Alkaline  Phosphatase 58 38 - 126 U/L   Total Bilirubin 0.5 0.3 - 1.2 mg/dL   GFR calc non Af Amer >60 >60 mL/min   GFR calc Af Amer >60 >60 mL/min   Anion gap 9 5 - 15    Comment: Performed at [facility], [address], [place], [place] [address]   Dg Lumbar Spine 2-3 Views  Result Date: 01/25/2019 CLINICAL DATA:  Low back pain.  Known bony metastatic disease. EXAM: LUMBAR SPINE - 2-3 VIEW COMPARISON:  CT scan October 28, 2018 FINDINGS: The patient is status post vertebroplasties at T12 and L3. Dense sclerosis is seen in multiple vertebral bodies including T10, T12, L2, L3. More subtle sclerotic metastases are seen in the sacrum. No acute fractures or malalignment identified. 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 Vertebroplasties at T12 and L3. No acute fracture or traumatic malalignment identified. Electronically Signed   By: Dorise Bullion III M.D   On: 01/25/2019 21:31   Ct Head Wo Contrast  Result Date: 01/25/2019 CLINICAL DATA:  Altered mental status EXAM: CT HEAD WITHOUT CONTRAST TECHNIQUE: Contiguous axial images were obtained from the base of the skull through the vertex without intravenous contrast. COMPARISON:  None. FINDINGS: Brain: There are 2 predominantly cystic lesions of the right frontal lobe, the larger of which measures 5.3 x 4.2 x 4.5 cm. There is mild edema surrounding both lesions. There is mild leftward bulging of the falx cerebri without frank herniation of cingulate gyrus. No contralateral lesions. There is mass effect on the right lateral ventricle without midline shift. No intracranial hemorrhage. Vascular: No abnormal hyperdensity of the major intracranial arteries or dural venous sinuses. No intracranial atherosclerosis. Skull: The visualized skull base, calvarium and extracranial soft tissues are normal. Sinuses/Orbits: No fluid levels or advanced mucosal thickening of the visualized paranasal sinuses. No mastoid or middle ear effusion. The orbits are  normal. IMPRESSION: 1. Two predominantly cystic, large lesions of the right frontal lobe with mild surrounding edema. Cystic metastases are the primary consideration. Wall intracranial metastasis from prostate cancer is rare, cystic prostate cancer metastases have been reported. MRI of the brain with and without contrast is recommended for further assessment, as alternative considerations include primary brain neoplasm or cerebral abscess. 2. Mild mass effect with leftward bulging of falx cerebri without frank  herniation. Electronically Signed   By: Ulyses Jarred M.D.   On: 01/25/2019 21:38   Mr Jeri Cos And Wo Contrast  Result Date: 01/26/2019 CLINICAL DATA:  Initial evaluation for acute difficulty with memory and activities of daily living, left-sided weakness. EXAM: MRI HEAD WITHOUT AND WITH CONTRAST TECHNIQUE: Multiplanar, multiecho pulse sequences of the brain and surrounding structures were obtained without and with intravenous contrast. CONTRAST:  7 cc of Gadavist. COMPARISON:  Prior CT from 01/25/2019. FINDINGS: Brain: Cerebral volume within normal limits for age. No significant cerebral white matter changes. No abnormal foci of restricted diffusion to suggest acute or subacute ischemia. No areas of chronic cortical infarction. No susceptibility artifact to suggest acute or chronic intracranial hemorrhage. No extra-axial fluid collection. Pituitary gland suprasellar region normal. Large well-circumscribed unilocular cystic mass positioned along the gray-white matter differentiation at the anterior right frontal lobe measures 5.1 x 4.7 x 4.6 cm (AP by transverse by craniocaudad). Second cystic mass positioned posteriorly along the gray-white matter differentiation of the right frontoparietal region measures 3.8 x 3.7 x 4.0 cm (AP by transverse by craniocaudad). Associated localized vasogenic edema with regional mass effect. Right lateral ventricle partially effaced, with associated 7 mm of right-to-left  midline shift. No hydrocephalus or ventricular trapping. Basilar cisterns remain patent. There is a third cystic mass positioned at the anterior right frontal pole measuring 1 cm (series 18, image 116). Punctate 5 mm focus of nodular enhancement present at the left middle cerebellar peduncle (series 18, image 55). Additional possible punctate focus of abnormal enhancement within the contralateral right cerebellum (series 18, image 55). Overall, findings are most consistent with intracranial metastatic disease. Vascular: Major intracranial vascular flow voids are maintained. Skull and upper cervical spine: Craniocervical junction within normal limits. Heterogeneous marrow signal intensity within the visualized upper cervical spine favored to be degenerative. No focal marrow replacing lesion. No scalp soft tissue abnormality. Sinuses/Orbits: Globes and orbital soft tissues within normal limits. Small left maxillary sinus retention cyst noted. Paranasal sinuses are otherwise clear. No mastoid effusion. Inner ear structures grossly normal. Other: None. IMPRESSION: 1. Three total cystic masses positioned within the right cerebral hemisphere, with additional punctate 5 mm focus of nodular enhancement at the left middle cerebellar peduncle, consistent with intracranial metastatic disease. Associated regional mass effect and edema about the larger lesions with up to 7 mm of right-to-left midline shift. No hydrocephalus or ventricular trapping. 2. Question additional punctate focus of abnormal enhancement at the right cerebellum as above, which could reflect an additional small lesion. Attention at follow-up recommended. Electronically Signed   By: Jeannine Boga M.D.   On: 01/26/2019 01:54    Review of Systems  Constitutional: Negative for chills and fever.  HENT: Negative for sore throat and tinnitus.   Eyes: Negative for blurred vision and redness.  Respiratory: Negative for cough and shortness of breath.    Cardiovascular: Negative for chest pain, palpitations, orthopnea and PND.  Gastrointestinal: Negative for abdominal pain, diarrhea, nausea and vomiting.  Genitourinary: Negative for dysuria, frequency and urgency.  Musculoskeletal: Negative for joint pain and myalgias.  Skin: Negative for rash.       No lesions  Neurological: Positive for weakness. Negative for speech change and focal weakness.  Endo/Heme/Allergies: Does not bruise/bleed easily.       No temperature intolerance  Psychiatric/Behavioral: Negative for depression and suicidal ideas.    Blood pressure 140/73, pulse 61, temperature 99.3 F (37.4 C), temperature source Oral, resp. rate 16, height 6' (1.829 m), weight  72.6 kg, SpO2 99 %. Physical Exam  Vitals reviewed. Constitutional: He is oriented to person, place, and time. He appears well-developed and well-nourished. No distress.  HENT:  Head: Normocephalic and atraumatic.  Mouth/Throat: Oropharynx is clear and moist.  Eyes: Pupils are equal, round, and reactive to light. Conjunctivae and EOM are normal. No scleral icterus.  Neck: Normal range of motion. Neck supple. No JVD present. No tracheal deviation present. No thyromegaly present.  Cardiovascular: Normal rate, regular rhythm and normal heart sounds. Exam reveals no gallop and no friction rub.  No murmur heard. Respiratory: Effort normal and breath sounds normal. No respiratory distress.  GI: Soft. Bowel sounds are normal. He exhibits no distension. There is no abdominal tenderness.  Genitourinary:    Genitourinary Comments: Deferred   Musculoskeletal: Normal range of motion.        General: No edema.  Lymphadenopathy:    He has no cervical adenopathy.  Neurological: He is alert and oriented to person, place, and time. No cranial nerve deficit.  Skin: Skin is warm and dry. No rash noted. No erythema.  Psychiatric: He has a normal mood and affect. His behavior is normal. Judgment and thought content normal.      Assessment/Plan This is a 68 year old male admitted for brain metastases. 1.  Prostate cancer metastatic to multiple sites: Continue Decadron every 6 hours.  Mental status is improved.  I have held anticoagulation due to potential bleeding risk of brain lesions.  Consult oncology.  Plan is for radiation to the brain. 2.  Confusion: Resolved. 3.  DVT prophylaxis: SCDs 4.  GI prophylaxis: None The patient is a full code.  Time spent on admission orders and patient care approximately 45 minutes  Harrie Foreman, MD 01/26/2019, 3:17 AM

## 2019-01-26 NOTE — ED Notes (Signed)
Pt saying goodbye to wife and then this RN will transport pt upstairs.

## 2019-01-26 NOTE — ED Notes (Signed)
Pt ambulatory to toilet to urinate. Wife remains at bedside. Given clean gown.

## 2019-01-26 NOTE — ED Provider Notes (Signed)
  Physical Exam  BP 140/73   Pulse 61   Temp 99.3 F (37.4 C) (Oral)   Resp 16   Ht 1.829 m (6')   Wt 72.6 kg   SpO2 99%   BMI 21.70 kg/m   Physical Exam  ED Course/Procedures     Procedures  MDM  I assumed care of the patient from Dr. Kerman Passey 11:00 PM with recommendation to follow-up on MRI.  Patient's MRI revealed 3 cystic lesions consistent with metastases with mass-effect and midline shift that was 7 mm right to left shift.  Patient was given Decadron by Dr. Kerman Passey.  Patient discussed with Dr. Grayland Ormond oncologist on-call who agreed with plan for hospital admission for continued IV steroids and radiation oncology consultation.       Gregor Hams, MD 01/26/19 [id number]

## 2019-01-26 NOTE — ED Notes (Signed)
ED TO INPATIENT HANDOFF REPORT  ED Nurse Name and Phone #: Karlis Cregg [phone]  S Name/Age/Gender William Wright 68 y.o. male Room/Bed: ED05A/ED05A  Code Status   Code Status: Not on file  Home/SNF/Other Home Patient oriented to: self, place, time and situation Is this baseline? Yes   Triage Complete: Triage complete  Chief Complaint Possible TIA  Triage Note Reports symptoms were noticed past Monday worse today.  Wife reported noticed problems with memory and difficulty with daily adls (getting dressed).  Patient reports feeling weak on left side.   Allergies Allergies  Allergen Reactions  . No Known Allergies     Level of Care/Admitting Diagnosis ED Disposition    ED Disposition Condition [facility]: [place] [[id number]]  Level of Care: Med-Surg [16]  Covid Evaluation: Confirmed COVID Negative  Diagnosis: Metastatic cancer to brain [facility]) [[id number]]  Admitting Physician: Harrie Foreman [[id number]]  Attending Physician: Harrie Foreman [[id number]]  Estimated length of stay: past midnight tomorrow  Certification:: I certify this patient will need inpatient services for at least 2 midnights  PT Class (Do Not Modify): Inpatient [101]  PT Acc Code (Do Not Modify): Private [1]       B Medical/Surgery History Past Medical History:  Diagnosis Date  . Cancer of prostate ([place]) 07/2012   OraL chemo pill.   . Family history of leukemia    Past Surgical History:  Procedure Laterality Date  . EXTRACORPOREAL SHOCK WAVE LITHOTRIPSY Left 05/20/2015   Procedure: EXTRACORPOREAL SHOCK WAVE LITHOTRIPSY (ESWL);  Surgeon: Royston Cowper, MD;  Location: [facility] ORS;  Service: Urology;  Laterality: Left;  . EXTRACORPOREAL SHOCK WAVE LITHOTRIPSY Right 12/21/2016   Procedure: EXTRACORPOREAL SHOCK WAVE LITHOTRIPSY (ESWL);  Surgeon: Royston Cowper, MD;  Location: [facility] ORS;  Service: Urology;  Laterality: Right;  . IR RADIOLOGIST EVAL & MGMT   02/28/2018     A IV Location/Drains/Wounds Patient Lines/Drains/Airways Status   Active Line/Drains/Airways    Name:   Placement date:   Placement time:   Site:   Days:   Peripheral IV 01/25/19 Right Antecubital   01/25/19    2348    Antecubital   1          Intake/Output Last 24 hours No intake or output data in the 24 hours ending 01/26/19 3785  Labs/Imaging Results for orders placed or performed during the hospital encounter of 01/25/19 (from the past 48 hour(s))  Protime-INR     Status: None   Collection Time: 01/25/19  8:33 PM  Result Value Ref Range   Prothrombin Time 12.8 11.4 - 15.2 seconds   INR 1.0 0.8 - 1.2    Comment: (NOTE) INR goal varies based on device and disease states. Performed at [facility] [address], [address] [place], [place] [address]   APTT     Status: None   Collection Time: 01/25/19  8:33 PM  Result Value Ref Range   aPTT 27 24 - 36 seconds    Comment: Performed at [facility], [address] [place], [place] [address]  CBC     Status: Abnormal   Collection Time: 01/25/19  8:33 PM  Result Value Ref Range   WBC 5.0 4.0 - 10.5 K/uL   RBC 3.86 (L) 4.22 - 5.81 MIL/uL   Hemoglobin 12.6 (L) 13.0 - 17.0 g/dL   HCT 38.8 (L) 39.0 - 52.0 %   MCV 100.5 (H) 80.0 - 100.0 fL   MCH  32.6 26.0 - 34.0 pg   MCHC 32.5 30.0 - 36.0 g/dL   RDW 13.3 11.5 - 15.5 %   Platelets 185 150 - 400 K/uL   nRBC 0.0 0.0 - 0.2 %    Comment: Performed at [facility], [address] [place], [place] [address]  Differential     Status: None   Collection Time: 01/25/19  8:33 PM  Result Value Ref Range   Neutrophils Relative % 59 %   Neutro Abs 2.9 1.7 - 7.7 K/uL   Lymphocytes Relative 24 %   Lymphs Abs 1.2 0.7 - 4.0 K/uL   Monocytes Relative 10 %   Monocytes Absolute 0.5 0.1 - 1.0 K/uL   Eosinophils Relative 7 %   Eosinophils Absolute 0.3 0.0 - 0.5 K/uL   Basophils Relative 0 %   Basophils Absolute 0.0 0.0 - 0.1 K/uL   Immature  Granulocytes 0 %   Abs Immature Granulocytes 0.02 0.00 - 0.07 K/uL    Comment: Performed at [facility], [address] [place], [place] [address]  Comprehensive metabolic panel     Status: Abnormal   Collection Time: 01/25/19  8:33 PM  Result Value Ref Range   Sodium 141 135 - 145 mmol/L   Potassium 4.0 3.5 - 5.1 mmol/L   Chloride 107 98 - 111 mmol/L   CO2 25 22 - 32 mmol/L   Glucose, Bld 112 (H) 70 - 99 mg/dL   BUN 27 (H) 8 - 23 mg/dL   Creatinine, Ser 0.86 0.61 - 1.24 mg/dL   Calcium 9.2 8.9 - 10.3 mg/dL   Total Protein 6.9 6.5 - 8.1 g/dL   Albumin 4.2 3.5 - 5.0 g/dL   AST 16 15 - 41 U/L   ALT 11 0 - 44 U/L   Alkaline Phosphatase 58 38 - 126 U/L   Total Bilirubin 0.5 0.3 - 1.2 mg/dL   GFR calc non Af Amer >60 >60 mL/min   GFR calc Af Amer >60 >60 mL/min   Anion gap 9 5 - 15    Comment: Performed at [facility] [address], [place], [place] [address]  SARS Coronavirus 2 (CEPHEID - Performed in [place] hospital lab), Hosp Order     Status: None   Collection Time: 01/26/19  6:28 AM   Specimen: Nasopharyngeal Swab  Result Value Ref Range   SARS Coronavirus 2 NEGATIVE NEGATIVE    Comment: (NOTE) If result is NEGATIVE SARS-CoV-2 target nucleic acids are NOT DETECTED. The SARS-CoV-2 RNA is generally detectable in upper and lower  respiratory specimens during the acute phase of infection. The lowest  concentration of SARS-CoV-2 viral copies this assay can detect is 250  copies / mL. A negative result does not preclude SARS-CoV-2 infection  and should not be used as the sole basis for treatment or other  patient management decisions.  A negative result may occur with  improper specimen collection / handling, submission of specimen other  than nasopharyngeal swab, presence of viral mutation(s) within the  areas targeted by this assay, and inadequate number of viral copies  (<250 copies / mL). A negative result must be combined with clinical   observations, patient history, and epidemiological information. If result is POSITIVE SARS-CoV-2 target nucleic acids are DETECTED. The SARS-CoV-2 RNA is generally detectable in upper and lower  respiratory specimens dur ing the acute phase of infection.  Positive  results are indicative of active infection with SARS-CoV-2.  Clinical  correlation with patient history  and other diagnostic information is  necessary to determine patient infection status.  Positive results do  not rule out bacterial infection or co-infection with other viruses. If result is PRESUMPTIVE POSTIVE SARS-CoV-2 nucleic acids MAY BE PRESENT.   A presumptive positive result was obtained on the submitted specimen  and confirmed on repeat testing.  While 2019 novel coronavirus  (SARS-CoV-2) nucleic acids may be present in the submitted sample  additional confirmatory testing may be necessary for epidemiological  and / or clinical management purposes  to differentiate between  SARS-CoV-2 and other Sarbecovirus currently known to infect humans.  If clinically indicated additional testing with an alternate test  methodology [id number]) is advised. The SARS-CoV-2 RNA is generally  detectable in upper and lower respiratory sp ecimens during the acute  phase of infection. The expected result is Negative. Fact Sheet for Patients:  StrictlyIdeas.no Fact Sheet for Healthcare Providers: BankingDealers.co.za This test is not yet approved or cleared by the [place] FDA and has been authorized for detection and/or diagnosis of SARS-CoV-2 by FDA under an Emergency Use Authorization (EUA).  This EUA will remain in effect (meaning this test can be used) for the duration of the COVID-19 declaration under Section 564(b)(1) of the Act, 21 U.S.C. section 360bbb-3(b)(1), unless the authorization is terminated or revoked sooner. Performed at [facility], [address], [place], [place] [address]    Dg Lumbar Spine 2-3 Views  Result Date: 01/25/2019 CLINICAL DATA:  Low back pain.  Known bony metastatic disease. EXAM: LUMBAR SPINE - 2-3 VIEW COMPARISON:  CT scan October 28, 2018 FINDINGS: The patient is status post vertebroplasties at T12 and L3. Dense sclerosis is seen in multiple vertebral bodies including T10, T12, L2, L3. More subtle sclerotic metastases are seen in the sacrum. No acute fractures or malalignment identified. 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 Vertebroplasties at T12 and L3. No acute fracture or traumatic malalignment identified. Electronically Signed   By: Dorise Bullion III M.D   On: 01/25/2019 21:31   Ct Head Wo Contrast  Result Date: 01/25/2019 CLINICAL DATA:  Altered mental status EXAM: CT HEAD WITHOUT CONTRAST TECHNIQUE: Contiguous axial images were obtained from the base of the skull through the vertex without intravenous contrast. COMPARISON:  None. FINDINGS: Brain: There are 2 predominantly cystic lesions of the right frontal lobe, the larger of which measures 5.3 x 4.2 x 4.5 cm. There is mild edema surrounding both lesions. There is mild leftward bulging of the falx cerebri without frank herniation of cingulate gyrus. No contralateral lesions. There is mass effect on the right lateral ventricle without midline shift. No intracranial hemorrhage. Vascular: No abnormal hyperdensity of the major intracranial arteries or dural venous sinuses. No intracranial atherosclerosis. Skull: The visualized skull base, calvarium and extracranial soft tissues are normal. Sinuses/Orbits: No fluid levels or advanced mucosal thickening of the visualized paranasal sinuses. No mastoid or middle ear effusion. The orbits are normal. IMPRESSION: 1. Two predominantly cystic, large lesions of the right frontal lobe with mild surrounding edema. Cystic metastases are the primary consideration. Wall intracranial metastasis from prostate cancer is rare,  cystic prostate cancer metastases have been reported. MRI of the brain with and without contrast is recommended for further assessment, as alternative considerations include primary brain neoplasm or cerebral abscess. 2. Mild mass effect with leftward bulging of falx cerebri without frank herniation. Electronically Signed   By: Ulyses Jarred M.D.   On: 01/25/2019 21:38   Mr Jeri Cos And Wo Contrast  Result Date: 01/26/2019 CLINICAL DATA:  Initial evaluation for acute difficulty with memory and activities of daily living, left-sided weakness. EXAM: MRI HEAD WITHOUT AND WITH CONTRAST TECHNIQUE: Multiplanar, multiecho pulse sequences of the brain and surrounding structures were obtained without and with intravenous contrast. CONTRAST:  7 cc of Gadavist. COMPARISON:  Prior CT from 01/25/2019. FINDINGS: Brain: Cerebral volume within normal limits for age. No significant cerebral white matter changes. No abnormal foci of restricted diffusion to suggest acute or subacute ischemia. No areas of chronic cortical infarction. No susceptibility artifact to suggest acute or chronic intracranial hemorrhage. No extra-axial fluid collection. Pituitary gland suprasellar region normal. Large well-circumscribed unilocular cystic mass positioned along the gray-white matter differentiation at the anterior right frontal lobe measures 5.1 x 4.7 x 4.6 cm (AP by transverse by craniocaudad). Second cystic mass positioned posteriorly along the gray-white matter differentiation of the right frontoparietal region measures 3.8 x 3.7 x 4.0 cm (AP by transverse by craniocaudad). Associated localized vasogenic edema with regional mass effect. Right lateral ventricle partially effaced, with associated 7 mm of right-to-left midline shift. No hydrocephalus or ventricular trapping. Basilar cisterns remain patent. There is a third cystic mass positioned at the anterior right frontal pole measuring 1 cm (series 18, image 116). Punctate 5 mm focus of  nodular enhancement present at the left middle cerebellar peduncle (series 18, image 55). Additional possible punctate focus of abnormal enhancement within the contralateral right cerebellum (series 18, image 55). Overall, findings are most consistent with intracranial metastatic disease. Vascular: Major intracranial vascular flow voids are maintained. Skull and upper cervical spine: Craniocervical junction within normal limits. Heterogeneous marrow signal intensity within the visualized upper cervical spine favored to be degenerative. No focal marrow replacing lesion. No scalp soft tissue abnormality. Sinuses/Orbits: Globes and orbital soft tissues within normal limits. Small left maxillary sinus retention cyst noted. Paranasal sinuses are otherwise clear. No mastoid effusion. Inner ear structures grossly normal. Other: None. IMPRESSION: 1. Three total cystic masses positioned within the right cerebral hemisphere, with additional punctate 5 mm focus of nodular enhancement at the left middle cerebellar peduncle, consistent with intracranial metastatic disease. Associated regional mass effect and edema about the larger lesions with up to 7 mm of right-to-left midline shift. No hydrocephalus or ventricular trapping. 2. Question additional punctate focus of abnormal enhancement at the right cerebellum as above, which could reflect an additional small lesion. Attention at follow-up recommended. Electronically Signed   By: Jeannine Boga M.D.   On: 01/26/2019 01:54    Pending Labs Unresulted Labs (From admission, onward)    Start     Ordered   Signed and Held  TSH  Add-on,   R     Signed and Held          Vitals/Pain Today's Vitals   01/26/19 0600 01/26/19 0630 01/26/19 0700 01/26/19 0825  BP: (!) 145/67 (!) 141/72 127/70   Pulse: 64 (!) 57 (!) 54   Resp:      Temp:      TempSrc:      SpO2: 96% 97% 96%   Weight:      Height:      PainSc:    0-No pain    Isolation Precautions No active  isolations  Medications Medications  sodium chloride flush (NS) 0.9 % injection 3 mL (3 mLs Intravenous Not Given 01/26/19 0100)  dexamethasone (DECADRON) injection 10 mg (10 mg Intravenous Given 01/25/19 2350)  gadobutrol (GADAVIST) 1 MMOL/ML injection 7 mL (7 mLs Intravenous Contrast Given  01/26/19 0101)  alum & mag hydroxide-simeth (MAALOX/MYLANTA) 200-200-20 MG/5ML suspension 30 mL (30 mLs Oral Given 01/26/19 0339)  dexamethasone (DECADRON) injection 10 mg (10 mg Intravenous Given 01/26/19 2563)    Mobility walks Low fall risk   Focused Assessments    R Recommendations: See Admitting Provider Note  Report given to:   Additional Notes:  Pt has wife at bedside- please allow her to come with him to floor

## 2019-01-26 NOTE — ED Notes (Signed)
Report given to Malka, RN 

## 2019-01-26 NOTE — ED Notes (Signed)
ED Provider at bedside. 

## 2019-01-26 NOTE — ED Notes (Signed)
Attempted to call report- will call back in 15 minutes

## 2019-01-26 NOTE — ED Notes (Signed)
Patient returned from MRI.

## 2019-01-26 NOTE — ED Notes (Signed)
Pt up to use bathroom 

## 2019-01-26 NOTE — ED Notes (Signed)
Attempted to call report and was told they are not taking this pt and that they called the supervisor

## 2019-01-26 NOTE — Progress Notes (Signed)
Advanced Care Plan.  Purpose of Encounter: CODE STATUS. Parties in Attendance: The patient and me. Patient's Decisional Capacity: Yes. Medical Story: William Wright is an 68 y.o. 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  He is being admitted for confusion secondary to prostate cancer metastasis to brain.  I discussed the patient about his current condition, poor prognosis and CODE STATUS.  The patient stated that he has been fighting with prostate cancer for 7 years.  He wants to be resuscitated and intubated if he has cardiopulmonary arrest. Plan:  Code Status: Full code. Time spent discussing advance care planning: 18 minutes.

## 2019-01-26 NOTE — ED Notes (Signed)
Patient transported to MRI 

## 2019-01-27 ENCOUNTER — Ambulatory Visit: Payer: Medicare Other | Attending: Radiation Oncology | Admitting: Radiation Oncology

## 2019-01-27 ENCOUNTER — Ambulatory Visit: Payer: Medicare Other

## 2019-01-27 ENCOUNTER — Telehealth: Payer: Self-pay

## 2019-01-27 ENCOUNTER — Inpatient Hospital Stay: Payer: Medicare Other | Admitting: Internal Medicine

## 2019-01-27 ENCOUNTER — Inpatient Hospital Stay: Payer: Medicare Other

## 2019-01-27 ENCOUNTER — Ambulatory Visit
Admission: RE | Admit: 2019-01-27 | Discharge: 2019-01-27 | Disposition: A | Discharge status: Home / Self Care | Payer: Medicare Other | Source: Ambulatory Visit | Attending: Radiation Oncology | Admitting: Radiation Oncology

## 2019-01-27 DIAGNOSIS — C61 Malignant neoplasm of prostate: Secondary | ICD-10-CM

## 2019-01-27 DIAGNOSIS — C7951 Secondary malignant neoplasm of bone: Secondary | ICD-10-CM

## 2019-01-27 DIAGNOSIS — C7931 Secondary malignant neoplasm of brain: Secondary | ICD-10-CM | POA: Insufficient documentation

## 2019-01-27 DIAGNOSIS — Z510 Encounter for antineoplastic radiation therapy: Secondary | ICD-10-CM | POA: Insufficient documentation

## 2019-01-27 LAB — GLUCOSE, CAPILLARY
Glucose-Capillary: 123 mg/dL — ABNORMAL HIGH (ref 70–99)
Glucose-Capillary: 111 mg/dL — ABNORMAL HIGH (ref 70–99)

## 2019-01-27 MED ORDER — DEXAMETHASONE 4 MG PO TABS: 4.0000 mg | ORAL_TABLET | Freq: Three times a day (TID) | ORAL | 0 refills | Status: DC

## 2019-01-27 MED ORDER — DEXAMETHASONE 4 MG PO TABS
4.0000 mg | ORAL_TABLET | Freq: Three times a day (TID) | ORAL | Status: DC
  Administered 2019-01-27: 4 mg via ORAL
  Filled 2019-01-27: qty 1

## 2019-01-27 NOTE — Progress Notes (Signed)
Discharge instructions given and went over with patient at bedside. Prescription given. All questions answered. Patient to discharge home when transportation available. Madlyn Frankel, RN

## 2019-01-27 NOTE — Progress Notes (Signed)
Please note, patient is currently followed by outpatient Palliative at home. CMRN Doran Clay made aware. Flo Shanks BSN, RN, [facility] [phone]

## 2019-01-27 NOTE — Progress Notes (Signed)
Radiation Oncology Follow up Note  Name: William Wright   Date:   01/25/2019 MRN:  [id number] DOB: 1951-06-23    This 68 y.o. male presents to the hospital today for brain metastasis and patient with known stage IV hormone resistant prostate cancer  REFERRING PROVIDER: No ref. provider found  HPI: Patient is a 68 year old male previously treated back in 2014 for a Gleason 7 (4+3) adenocarcinoma stage IIb presenting with a PSA of 18.4.  He has been treated for bone metastasis for stage IV disease back in 2019 to T12 L5.  Most recently we have decided to treat his sacrum for increasing pain and he is also being considered for radium 223 treatments..  He was recently admitted from the emergency room having some subtle left-sided weakness.  MRI of the brain showed 2 prominent cystic lesions in the right frontal and parietal lobes with at least 5 metastatic lesions of the brain.  He has been seen by neurosurgery and they discussed the possibility of resecting the 2 large cystic masses since they do have a mass-effect.  Again patient is currently on treatment for sacral metastasis.  He is overall alert and oriented.  COMPLICATIONS OF TREATMENT: none  FOLLOW UP COMPLIANCE: keeps appointments   PHYSICAL EXAM:  BP 126/68 (BP Location: Right Arm)   Pulse (!) 48   Temp 97.7 F (36.5 C) (Oral)   Resp 18   Ht 6' (1.829 m)   Wt 160 lb 0.9 oz (72.6 kg)   SpO2 98%   BMI 21.71 kg/m  Neurologic examination is intact.  Well-developed well-nourished patient in NAD. HEENT reveals PERLA, EOMI, discs not visualized.  Oral cavity is clear. No oral mucosal lesions are identified. Neck is clear without evidence of cervical or supraclavicular adenopathy. Lungs are clear to A&P. Cardiac examination is essentially unremarkable with regular rate and rhythm without murmur rub or thrill. Abdomen is benign with no organomegaly or masses noted. Motor sensory and DTR levels are equal and symmetric in the upper and lower  extremities. Cranial nerves II through XII are grossly intact. Proprioception is intact. No peripheral adenopathy or edema is identified. No motor or sensory levels are noted. Crude visual fields are within normal range.  RADIOLOGY RESULTS: MRI scan of brain reviewed and compatible with above-stated findings  PLAN: At this time we will await the patient's family's decision required re-guarding surgical intervention of the 2 large masses.  I would still opt for whole brain radiation therapy 3000 cGy in 12 fractions at 250 cGy per fraction.  Risks and benefits of the treatment occluding hair loss fatigue possible cognitive decline all were discussed in detail with the patient.  I have tentatively set up and ordered CT simulation for later this week.  Should there family decide on surgical intervention upfront will delay radiation therapy till after that is performed.  I would like to take this opportunity to thank you for allowing me to participate in the care of your patient.Noreene Filbert, MD

## 2019-01-27 NOTE — Assessment & Plan Note (Addendum)
#  68 year old male patient with a history of prostate cancer castrate resistant status post multiple lines of therapy currently on Gillermina Phy is currently admitted hospital for left upper extremity weakness/facial droop-MRI brain suggestive of metastatic disease  #Multiple mets to the brain-however at least 3 cystic masses noted.  Currently on dexamethasone.  Await surgical evaluation with Dr. Lacinda Axon.  If for any reason surgery is not recommended-whole brain radiation would be reasonable option.  Patient has been evaluated by Dr. [name].   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  Will discontinue Xtandi given the risk of seizures with brain metastases.  Discussed with patient continues to decline chemotherapy.  Patient awaiting SBRT/EBRT to left hip.   #Lower back pain radiating to his left hip-patient taking pain medication sparingly.  Currently stable.  #Disposition: Patient/wife interested in having the patient home at the earliest.  Discussed with the wife that we will await evaluation with neurosurgery; and plan discharge accordingly.   Thank you Dr.Chen for allowing me to participate in the care of your pleasant patient. Please do not hesitate to contact me with questions or concerns in the interim.  Discussed with Dr. Bridgett Larsson.  Also discussed with Dr. [name]; and Dr. Lacinda Axon.   Addendum: Discussed Dr. Radene Gunning include surgery-followed by radiation vs. radiation alone.  Patient still undecided.  Patient will be discharged home; will make a follow-up this week to discuss patient's choice of treatment.

## 2019-01-27 NOTE — Consult Note (Signed)
[facility] CONSULT NOTE  Patient Care Team: Cammie Sickle, MD as PCP - General (Internal Medicine) Royston Cowper, MD (Urology)  CHIEF COMPLAINTS/PURPOSE OF CONSULTATION:  Prostate cancer/brain metastases  HISTORY OF PRESENTING ILLNESS:  William Wright 68 y.o.  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   Patient states that he was at the beach over the weekend.  His wife noted patient was "acting different"-patient had difficulty with his daily chores.  Noted to have weakness of his left upper extremity/and also left facial droop.  This concerned the patient's wife-who drove the patient directly to the emergency room.  Patient in the emergency room had MRI that showed 2 large cystic lesions in the right frontal area; and 3 more small lesions noted-that correspond to patient's current symptoms.  Patient is currently on dexamethasone.  Patient states he is left upper extremity weakness is slightly improved not resolved.  No seizures.  No nausea no vomiting.  No headaches.  Continues to have chronic back pain left hip pain.  Awaiting to start radiation this week to his hips.   Review of Systems  Constitutional: Negative for chills, diaphoresis, fever, malaise/fatigue and weight loss.  HENT: Negative for nosebleeds and sore throat.   Eyes: Negative for double vision.  Respiratory: Negative for cough, hemoptysis, sputum production, shortness of breath and wheezing.   Cardiovascular: Negative for chest pain, palpitations, orthopnea and leg swelling.  Gastrointestinal: Negative for abdominal pain, blood in stool, constipation, diarrhea, heartburn, melena, nausea and vomiting.  Genitourinary: Negative for dysuria, frequency and urgency.  Musculoskeletal: Positive for back pain and joint pain.  Skin: Negative.  Negative for itching and rash.  Neurological: Positive for  dizziness, focal weakness and weakness. Negative for tingling and headaches.  Endo/Heme/Allergies: Does not bruise/bleed easily.  Psychiatric/Behavioral: Negative for depression. The patient is not nervous/anxious and does not have insomnia.      MEDICAL HISTORY:  Past Medical History:  Diagnosis Date  . Cancer of prostate ([place]) 07/2012   OraL chemo pill.   . Family history of leukemia     SURGICAL HISTORY: Past Surgical History:  Procedure Laterality Date  . EXTRACORPOREAL SHOCK WAVE LITHOTRIPSY Left 05/20/2015   Procedure: EXTRACORPOREAL SHOCK WAVE LITHOTRIPSY (ESWL);  Surgeon: Royston Cowper, MD;  Location: [facility] ORS;  Service: Urology;  Laterality: Left;  . EXTRACORPOREAL SHOCK WAVE LITHOTRIPSY Right 12/21/2016   Procedure: EXTRACORPOREAL SHOCK WAVE LITHOTRIPSY (ESWL);  Surgeon: Royston Cowper, MD;  Location: [facility] ORS;  Service: Urology;  Laterality: Right;  . IR RADIOLOGIST EVAL & MGMT  02/28/2018    SOCIAL HISTORY: Social History   Socioeconomic History  . Marital status: Married    Spouse name: Not on file  . Number of children: Not on file  . Years of education: Not on file  . Highest education level: Not on file  Occupational History  . Not on file  Social Needs  . Financial resource strain: Not on file  . Food insecurity    Worry: Not on file    Inability: Not on file  . Transportation needs    Medical: Not on file    Non-medical: Not on file  Tobacco Use  . Smoking status: Never Smoker  . Smokeless tobacco: Never Used  Substance and Sexual Activity  . Alcohol use: No  . Drug use: No  . Sexual activity: Not on file  Lifestyle  . Physical  activity    Days per week: Not on file    Minutes per session: Not on file  . Stress: Not on file  Relationships  . Social Herbalist on phone: Not on file    Gets together: Not on file    Attends religious service: Not on file    Active member of club or organization: Not on file    Attends meetings of  clubs or organizations: Not on file    Relationship status: Not on file  . Intimate partner violence    Fear of current or ex partner: Not on file    Emotionally abused: Not on file    Physically abused: Not on file    Forced sexual activity: Not on file  Other Topics Concern  . Not on file  Social History Narrative  . Not on file    FAMILY HISTORY: Family History  Problem Relation Age of Onset  . Alzheimer's disease Father   . Lung cancer Maternal Grandfather 51  . Leukemia Paternal Grandmother 20  . COPD Paternal Grandfather   . Cancer Maternal Uncle        type unk, dx 80's    ALLERGIES:  is allergic to no known allergies.  MEDICATIONS:  Current Facility-Administered Medications  Medication Dose Route Frequency Provider Last Rate Last Dose  . acetaminophen (TYLENOL) tablet 650 mg  650 mg Oral Q6H PRN Harrie Foreman, MD       Or  . acetaminophen (TYLENOL) suppository 650 mg  650 mg Rectal Q6H PRN Harrie Foreman, MD      . dexamethasone (DECADRON) injection 4 mg  4 mg Intravenous Q6H Demetrios Loll, MD   4 mg at 01/27/19 0606  . docusate sodium (COLACE) capsule 100 mg  100 mg Oral BID Harrie Foreman, MD   100 mg at 01/27/19 0830  . insulin aspart (novoLOG) injection 0-5 Units  0-5 Units Subcutaneous QHS Harrie Foreman, MD      . insulin aspart (novoLOG) injection 0-9 Units  0-9 Units Subcutaneous TID WC Harrie Foreman, MD   1 Units at 01/27/19 0830  . ondansetron (ZOFRAN) tablet 4 mg  4 mg Oral Q6H PRN Harrie Foreman, MD       Or  . ondansetron [facility]) injection 4 mg  4 mg Intravenous Q6H PRN Harrie Foreman, MD      . sodium chloride flush (NS) 0.9 % injection 3 mL  3 mL Intravenous Once Harrie Foreman, MD          .  PHYSICAL EXAMINATION:  Vitals:   01/26/19 2158 01/27/19 0528  BP: 128/64 126/68  Pulse: (!) 58 (!) 48  Resp:    Temp: 98.4 F (36.9 C) 97.7 F (36.5 C)  SpO2: 94% 98%   Filed Weights   01/25/19 2020 01/27/19 0500   Weight: 160 lb (72.6 kg) 160 lb 0.9 oz (72.6 kg)    Physical Exam  Constitutional: He is oriented to person, place, and time and well-developed, well-nourished, and in no distress.  HENT:  Head: Normocephalic and atraumatic.  Mouth/Throat: Oropharynx is clear and moist. No oropharyngeal exudate.  Eyes: Pupils are equal, round, and reactive to light.  Neck: Normal range of motion. Neck supple.  Cardiovascular: Normal rate and regular rhythm.  Pulmonary/Chest: No respiratory distress. He has no wheezes.  Abdominal: Soft. Bowel sounds are normal. He exhibits no distension and no mass. There is no abdominal tenderness. There is no  rebound and no guarding.  Musculoskeletal: Normal range of motion.        General: No tenderness or edema.  Neurological: He is alert and oriented to person, place, and time.  Left upper extremity weakness/left upper extremity drift.  Skin: Skin is warm.  Psychiatric: Affect normal.     LABORATORY DATA:  I have reviewed the data as listed Lab Results  Component Value Date   WBC 5.0 01/25/2019   HGB 12.6 (L) 01/25/2019   HCT 38.8 (L) 01/25/2019   MCV 100.5 (H) 01/25/2019   PLT 185 01/25/2019   Recent Labs    11/29/18 1013 12/27/18 1018 01/25/19 2033  NA 139 140 141  K 4.3 4.1 4.0  CL 108 105 107  CO2 23 28 25   GLUCOSE 110* 110* 112*  BUN 24* 20 27*  CREATININE 1.00 1.07 0.86  CALCIUM 9.2 9.4 9.2  GFRNONAA >60 >60 >60  GFRAA >60 >60 >60  PROT 7.2 6.9 6.9  ALBUMIN 4.5 4.2 4.2  AST 15 36 16  ALT 10 50* 11  ALKPHOS 67 85 58  BILITOT 0.6 0.6 0.5    RADIOGRAPHIC STUDIES: I have personally reviewed the radiological images as listed and agreed with the findings in the report. Dg Lumbar Spine 2-3 Views  Result Date: 01/25/2019 CLINICAL DATA:  Low back pain.  Known bony metastatic disease. EXAM: LUMBAR SPINE - 2-3 VIEW COMPARISON:  CT scan October 28, 2018 FINDINGS: The patient is status post vertebroplasties at T12 and L3. Dense sclerosis is  seen in multiple vertebral bodies including T10, T12, L2, L3. More subtle sclerotic metastases are seen in the sacrum. No acute fractures or malalignment identified. 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 Vertebroplasties at T12 and L3. No acute fracture or traumatic malalignment identified. Electronically Signed   By: Dorise Bullion III M.D   On: 01/25/2019 21:31   Ct Head Wo Contrast  Result Date: 01/25/2019 CLINICAL DATA:  Altered mental status EXAM: CT HEAD WITHOUT CONTRAST TECHNIQUE: Contiguous axial images were obtained from the base of the skull through the vertex without intravenous contrast. COMPARISON:  None. FINDINGS: Brain: There are 2 predominantly cystic lesions of the right frontal lobe, the larger of which measures 5.3 x 4.2 x 4.5 cm. There is mild edema surrounding both lesions. There is mild leftward bulging of the falx cerebri without frank herniation of cingulate gyrus. No contralateral lesions. There is mass effect on the right lateral ventricle without midline shift. No intracranial hemorrhage. Vascular: No abnormal hyperdensity of the major intracranial arteries or dural venous sinuses. No intracranial atherosclerosis. Skull: The visualized skull base, calvarium and extracranial soft tissues are normal. Sinuses/Orbits: No fluid levels or advanced mucosal thickening of the visualized paranasal sinuses. No mastoid or middle ear effusion. The orbits are normal. IMPRESSION: 1. Two predominantly cystic, large lesions of the right frontal lobe with mild surrounding edema. Cystic metastases are the primary consideration. Wall intracranial metastasis from prostate cancer is rare, cystic prostate cancer metastases have been reported. MRI of the brain with and without contrast is recommended for further assessment, as alternative considerations include primary brain neoplasm or cerebral abscess. 2. Mild mass effect with leftward bulging of falx cerebri without frank herniation.  Electronically Signed   By: Ulyses Jarred M.D.   On: 01/25/2019 21:38   Mr Jeri Cos And Wo Contrast  Result Date: 01/26/2019 CLINICAL DATA:  Initial evaluation for acute difficulty with memory and activities of daily living, left-sided weakness. EXAM: MRI HEAD WITHOUT  AND WITH CONTRAST TECHNIQUE: Multiplanar, multiecho pulse sequences of the brain and surrounding structures were obtained without and with intravenous contrast. CONTRAST:  7 cc of Gadavist. COMPARISON:  Prior CT from 01/25/2019. FINDINGS: Brain: Cerebral volume within normal limits for age. No significant cerebral white matter changes. No abnormal foci of restricted diffusion to suggest acute or subacute ischemia. No areas of chronic cortical infarction. No susceptibility artifact to suggest acute or chronic intracranial hemorrhage. No extra-axial fluid collection. Pituitary gland suprasellar region normal. Large well-circumscribed unilocular cystic mass positioned along the gray-white matter differentiation at the anterior right frontal lobe measures 5.1 x 4.7 x 4.6 cm (AP by transverse by craniocaudad). Second cystic mass positioned posteriorly along the gray-white matter differentiation of the right frontoparietal region measures 3.8 x 3.7 x 4.0 cm (AP by transverse by craniocaudad). Associated localized vasogenic edema with regional mass effect. Right lateral ventricle partially effaced, with associated 7 mm of right-to-left midline shift. No hydrocephalus or ventricular trapping. Basilar cisterns remain patent. There is a third cystic mass positioned at the anterior right frontal pole measuring 1 cm (series 18, image 116). Punctate 5 mm focus of nodular enhancement present at the left middle cerebellar peduncle (series 18, image 55). Additional possible punctate focus of abnormal enhancement within the contralateral right cerebellum (series 18, image 55). Overall, findings are most consistent with intracranial metastatic disease. Vascular:  Major intracranial vascular flow voids are maintained. Skull and upper cervical spine: Craniocervical junction within normal limits. Heterogeneous marrow signal intensity within the visualized upper cervical spine favored to be degenerative. No focal marrow replacing lesion. No scalp soft tissue abnormality. Sinuses/Orbits: Globes and orbital soft tissues within normal limits. Small left maxillary sinus retention cyst noted. Paranasal sinuses are otherwise clear. No mastoid effusion. Inner ear structures grossly normal. Other: None. IMPRESSION: 1. Three total cystic masses positioned within the right cerebral hemisphere, with additional punctate 5 mm focus of nodular enhancement at the left middle cerebellar peduncle, consistent with intracranial metastatic disease. Associated regional mass effect and edema about the larger lesions with up to 7 mm of right-to-left midline shift. No hydrocephalus or ventricular trapping. 2. Question additional punctate focus of abnormal enhancement at the right cerebellum as above, which could reflect an additional small lesion. Attention at follow-up recommended. Electronically Signed   By: Jeannine Boga M.D.   On: 01/26/2019 01:54    Metastatic cancer to brain [facility]) #68 year old male patient with a history of prostate cancer castrate resistant status post multiple lines of therapy currently on Gillermina Phy is currently admitted hospital for left upper extremity weakness/facial droop-MRI brain suggestive of metastatic disease  #Multiple mets to the brain-however at least 3 cystic masses noted.  Currently on dexamethasone.  Await surgical evaluation with Dr. Lacinda Axon.  If for any reason surgery is not recommended-whole brain radiation would be reasonable option.  Patient has been evaluated by Dr. [name].   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  Will discontinue Xtandi given the risk of seizures with  brain metastases.  Discussed with patient continues to decline chemotherapy.  Patient awaiting SBRT/EBRT to left hip.   #Lower back pain radiating to his left hip-patient taking pain medication sparingly.  Currently stable.  #Disposition: Patient/wife interested in having the patient home at the earliest.  Discussed with the wife that we will await evaluation with neurosurgery; and plan discharge accordingly.   Thank you Dr.Chen for allowing me to participate in the care of your pleasant patient. Please  do not hesitate to contact me with questions or concerns in the interim.  Discussed with Dr. Bridgett Larsson.  Also discussed with Dr. [name]; and Dr. Lacinda Axon.   Addendum: Discussed Dr. Radene Gunning include surgery-followed by radiation vs. radiation alone.  Patient still undecided.  Patient will be discharged home; will make a follow-up this week to discuss patient's choice of treatment.   All questions were answered. The patient knows to call the clinic with any problems, questions or concerns.    Cammie Sickle, MD 01/27/2019 1:06 PM

## 2019-01-27 NOTE — Telephone Encounter (Signed)
Telephone call from patient's wife [name].  [name] reports patient was admitted to the hospital on Saturday night.  Patient was on vacation last week at the beach.  [name] reports she observed changes in patient and was concerned patient may be having mini strokes.  [name] reports MRI and CT scan shows patient has mets to the brain.  Patient admitted and will receive steroids and radiation.    RN called hospital and spoke with patient.  Patient states he is receiving IV steroids.  Patient hopeful that he will be discharged home today.  Patient states the plan is for him to receive radiation to shrink brain mets.  Patient denies having any needs RN can assist with.  Patient appreciative of call.

## 2019-01-27 NOTE — Consult Note (Signed)
Neurosurgery-New Consultation Evaluation 01/27/2019 [name] [id number]  Identifying Statement: William Wright is a 68 y.o. male from [place] [address] with brain metastases  Physician Requesting Consultation: Dr. [name], [facility]  History of Present Illness: William Wright is here for evaluation of some left-sided weakness in the setting of a history of prostate cancer.  He states that his wife started noticing him dropping more things with his left hand.  He does feel like his left hand and arm are slightly weaker.  He does not endorse any left leg weakness.  He has not noticed any change in sensation on his left side.  He does not endorse any shaking movements or seizure-like activity.  He states he has had no change in walking.  He is right-handed.  As part of the work-up for these symptoms, he did undergo an MRI of the brain which revealed multiple lesions, most prominently to large lesions on the right side.  He has known to have metastases in the spine also.  He has never had any radiation performed to his brain or skull.  He has been on multiple lines of treatment in the past.  Past Medical History:  Past Medical History:  Diagnosis Date  . Cancer of prostate ([place]) 07/2012   OraL chemo pill.   . Family history of leukemia     Social History: Social History   Socioeconomic History  . Marital status: Married    Spouse name: Not on file  . Number of children: Not on file  . Years of education: Not on file  . Highest education level: Not on file  Occupational History  . Not on file  Social Needs  . Financial resource strain: Not on file  . Food insecurity    Worry: Not on file    Inability: Not on file  . Transportation needs    Medical: Not on file    Non-medical: Not on file  Tobacco Use  . Smoking status: Never Smoker  . Smokeless tobacco: Never Used  Substance and Sexual Activity  . Alcohol use: No  . Drug use: No  . Sexual activity: Not  on file  Lifestyle  . Physical activity    Days per week: Not on file    Minutes per session: Not on file  . Stress: Not on file  Relationships  . Social Herbalist on phone: Not on file    Gets together: Not on file    Attends religious service: Not on file    Active member of club or organization: Not on file    Attends meetings of clubs or organizations: Not on file    Relationship status: Not on file  . Intimate partner violence    Fear of current or ex partner: Not on file    Emotionally abused: Not on file    Physically abused: Not on file    Forced sexual activity: Not on file  Other Topics Concern  . Not on file  Social History Narrative  . Not on file   Family History: Family History  Problem Relation Age of Onset  . Alzheimer's disease Father   . Lung cancer Maternal Grandfather 2  . Leukemia Paternal Grandmother 38  . COPD Paternal Grandfather   . Cancer Maternal Uncle        type unk, dx 80's    Review of Systems:  Review of Systems - General ROS: Negative Psychological ROS: Negative Ophthalmic ROS: Negative ENT  ROS: Negative Hematological and Lymphatic ROS: Negative  Endocrine ROS: Negative Respiratory ROS: Negative Cardiovascular ROS: Negative Gastrointestinal ROS: Negative Genito-Urinary ROS: Negative Musculoskeletal ROS: Negative Neurological ROS: Negative for speech changes or numbness, positive for weakness Dermatological ROS: Negative  Physical Exam: BP 126/68 (BP Location: Right Arm)   Pulse (!) 48   Temp 97.7 F (36.5 C) (Oral)   Resp 18   Ht 6' (1.829 m)   Wt 72.6 kg   SpO2 98%   BMI 21.71 kg/m  Body mass index is 21.71 kg/m. Body surface area is 1.92 meters squared. General appearance: Alert, cooperative, in no acute distress Head: Normocephalic, atraumatic Eyes: Normal, EOM intact Oropharynx: Moist without lesions Ext: No edema in LE bilaterally, warm extremities  Neurologic exam:  Mental status: alertness:  alert, orientation: person, place, time, affect: normal Speech: fluent and clear, naming and repetition are intact Cranial nerves:  II: Visual fields are full by confrontation except for some slight decrease noted on the left lower quadrant, no ptosis III/IV/VI: extra-ocular motions intact bilaterally V/VII:no evidence of facial droop or weakness  VIII: hearing normal XI: trapezius strength symmetric,  sternocleidomastoid strength symmetric XII: tongue strength symmetric  Motor: He has 4+ out of 5 grip on the left and 4+ out of 5 in biceps, 5 out of 5 otherwise throughout the upper and lower extremities, mild left pronator drift Sensory: intact to light touch in all extremities Gait: Not tested  Laboratory: Results for orders placed or performed during the hospital encounter of 01/25/19  SARS Coronavirus 2 (CEPHEID - Performed in [place] hospital lab), [facility] Order   Specimen: Nasopharyngeal Swab  Result Value Ref Range   SARS Coronavirus 2 NEGATIVE NEGATIVE  Protime-INR  Result Value Ref Range   Prothrombin Time 12.8 11.4 - 15.2 seconds   INR 1.0 0.8 - 1.2  APTT  Result Value Ref Range   aPTT 27 24 - 36 seconds  CBC  Result Value Ref Range   WBC 5.0 4.0 - 10.5 K/uL   RBC 3.86 (L) 4.22 - 5.81 MIL/uL   Hemoglobin 12.6 (L) 13.0 - 17.0 g/dL   HCT 38.8 (L) 39.0 - 52.0 %   MCV 100.5 (H) 80.0 - 100.0 fL   MCH 32.6 26.0 - 34.0 pg   MCHC 32.5 30.0 - 36.0 g/dL   RDW 13.3 11.5 - 15.5 %   Platelets 185 150 - 400 K/uL   nRBC 0.0 0.0 - 0.2 %  Differential  Result Value Ref Range   Neutrophils Relative % 59 %   Neutro Abs 2.9 1.7 - 7.7 K/uL   Lymphocytes Relative 24 %   Lymphs Abs 1.2 0.7 - 4.0 K/uL   Monocytes Relative 10 %   Monocytes Absolute 0.5 0.1 - 1.0 K/uL   Eosinophils Relative 7 %   Eosinophils Absolute 0.3 0.0 - 0.5 K/uL   Basophils Relative 0 %   Basophils Absolute 0.0 0.0 - 0.1 K/uL   Immature Granulocytes 0 %   Abs Immature Granulocytes 0.02 0.00 - 0.07 K/uL   Comprehensive metabolic panel  Result Value Ref Range   Sodium 141 135 - 145 mmol/L   Potassium 4.0 3.5 - 5.1 mmol/L   Chloride 107 98 - 111 mmol/L   CO2 25 22 - 32 mmol/L   Glucose, Bld 112 (H) 70 - 99 mg/dL   BUN 27 (H) 8 - 23 mg/dL   Creatinine, Ser 0.86 0.61 - 1.24 mg/dL   Calcium 9.2 8.9 - 10.3 mg/dL  Total Protein 6.9 6.5 - 8.1 g/dL   Albumin 4.2 3.5 - 5.0 g/dL   AST 16 15 - 41 U/L   ALT 11 0 - 44 U/L   Alkaline Phosphatase 58 38 - 126 U/L   Total Bilirubin 0.5 0.3 - 1.2 mg/dL   GFR calc non Af Amer >60 >60 mL/min   GFR calc Af Amer >60 >60 mL/min   Anion gap 9 5 - 15  TSH  Result Value Ref Range   TSH 2.514 0.350 - 4.500 uIU/mL  Glucose, capillary  Result Value Ref Range   Glucose-Capillary 128 (H) 70 - 99 mg/dL  Glucose, capillary  Result Value Ref Range   Glucose-Capillary 124 (H) 70 - 99 mg/dL  Glucose, capillary  Result Value Ref Range   Glucose-Capillary 123 (H) 70 - 99 mg/dL  Glucose, capillary  Result Value Ref Range   Glucose-Capillary 111 (H) 70 - 99 mg/dL   I personally reviewed labs  Imaging: Results for orders placed during the hospital encounter of 01/25/19  MR Brain W and Wo Contrast   Narrative CLINICAL DATA:  Initial evaluation for acute difficulty with memory and activities of daily living, left-sided weakness.  EXAM: MRI HEAD WITHOUT AND WITH CONTRAST  TECHNIQUE: Multiplanar, multiecho pulse sequences of the brain and surrounding structures were obtained without and with intravenous contrast.  CONTRAST:  7 cc of Gadavist.  COMPARISON:  Prior CT from 01/25/2019.  FINDINGS: Brain: Cerebral volume within normal limits for age. No significant cerebral white matter changes. No abnormal foci of restricted diffusion to suggest acute or subacute ischemia. No areas of chronic cortical infarction. No susceptibility artifact to suggest acute or chronic intracranial hemorrhage. No extra-axial fluid collection. Pituitary gland suprasellar  region normal.  Large well-circumscribed unilocular cystic mass positioned along the gray-white matter differentiation at the anterior right frontal lobe measures 5.1 x 4.7 x 4.6 cm (AP by transverse by craniocaudad). Second cystic mass positioned posteriorly along the gray-white matter differentiation of the right frontoparietal region measures 3.8 x 3.7 x 4.0 cm (AP by transverse by craniocaudad). Associated localized vasogenic edema with regional mass effect. Right lateral ventricle partially effaced, with associated 7 mm of right-to-left midline shift. No hydrocephalus or ventricular trapping. Basilar cisterns remain patent. There is a third cystic mass positioned at the anterior right frontal pole measuring 1 cm (series 18, image 116). Punctate 5 mm focus of nodular enhancement present at the left middle cerebellar peduncle (series 18, image 55). Additional possible punctate focus of abnormal enhancement within the contralateral right cerebellum (series 18, image 55). Overall, findings are most consistent with intracranial metastatic disease.  Vascular: Major intracranial vascular flow voids are maintained.  Skull and upper cervical spine: Craniocervical junction within normal limits. Heterogeneous marrow signal intensity within the visualized upper cervical spine favored to be degenerative. No focal marrow replacing lesion. No scalp soft tissue abnormality.  Sinuses/Orbits: Globes and orbital soft tissues within normal limits. Small left maxillary sinus retention cyst noted. Paranasal sinuses are otherwise clear. No mastoid effusion. Inner ear structures grossly normal.  Other: None.  IMPRESSION: 1. Three total cystic masses positioned within the right cerebral hemisphere, with additional punctate 5 mm focus of nodular enhancement at the left middle cerebellar peduncle, consistent with intracranial metastatic disease. Associated regional mass effect and edema about the  larger lesions with up to 7 mm of right-to-left midline shift. No hydrocephalus or ventricular trapping. 2. Question additional punctate focus of abnormal enhancement at the right cerebellum as above, which could  reflect an additional small lesion. Attention at follow-up recommended.   Electronically Signed   By: Jeannine Boga M.D.   On: 01/26/2019 01:54    I personally reviewed radiology studies personally   Impression/Plan:  Mr. Stetson is here for evaluation of some weakness noted subtly on the left side.  I did discuss with him that the MRI does show at least 5 lesions within the brain, with 2 prominent ones in the right frontal and parietal lobes.  These measure greater than 4 cm and are causing mass-effect with some edema.  I do suspect both of these could be affecting his strength given the physical exam findings.  I did discuss that our options at this time do consist of proceeding with radiation versus consideration of resection of the 2 larger lesions to reduce the mass-effect and allow for hopeful strength improvement before radiation.  We did discuss the surgery does carry some risk including bleeding, infection, seizure, stroke and recurrence.  We discussed that he would need radiation even after resection to prevent this recurrence.   1.  Diagnosis: Multiple cerebral metastases, 2 large right frontal and right parietal lesions.  2.  Plan -I I have discussed with oncology our options going forward with surgery before radiation versus continuation of radiation alone.  They will talk with the patient regarding the future chemotherapy and radiation options along with this.  They will let us know if the patient would like to proceed with surgery.

## 2019-01-27 NOTE — Discharge Summary (Addendum)
[facility] [place] NAME: Adarryl Goldammer    MR#:  [id number]  DATE OF BIRTH:  1951/07/11  DATE OF ADMISSION:  01/25/2019   ADMITTING PHYSICIAN: Harrie Foreman, MD  DATE OF DISCHARGE:  01/27/2019  PRIMARY CARE PHYSICIAN: Cammie Sickle, MD   ADMISSION DIAGNOSIS:  Brain metastases ([place]) [C79.31] DISCHARGE DIAGNOSIS:  Active Problems:   Metastatic cancer to brain [facility])  SECONDARY DIAGNOSIS:   Past Medical History:  Diagnosis Date  . Cancer of prostate ([place]) 07/2012   OraL chemo pill.   . Family history of leukemia    HOSPITAL COURSE:  This is a 68 year old male admitted for brain metastases. 1.  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  Mental status is improved.  He got radiation today.  He has no complaints.  Per Dr. [name], dexamethasone 4 mg 3 times daily for 2 weeks, he can be discharged today and follow-up him as outpatient. 2.  Confusion: Resolved. Discussed with Dr. [name]. DISCHARGE CONDITIONS:  Stable, discharged to home today. CONSULTS OBTAINED:  Treatment Team:  Deetta Perla, MD Noreene Filbert, MD DRUG ALLERGIES:   Allergies  Allergen Reactions  . No Known Allergies    DISCHARGE MEDICATIONS:   Allergies as of 01/27/2019      Reactions   No Known Allergies       Medication List    STOP taking these medications   ondansetron 8 MG tablet Commonly known as: Zofran   promethazine 25 MG tablet Commonly known as: PHENERGAN   Xtandi 40 MG capsule Generic drug: enzalutamide     TAKE these medications   dexamethasone 4 MG tablet Commonly known as: DECADRON Take 1 tablet (4 mg total) by mouth 3 (three) times daily for 14 days.   gabapentin 100 MG capsule Commonly known as: NEURONTIN Take 1 capsule (100 mg total) by mouth 3 (three) times daily.   oxybutynin 5 MG 24 hr tablet Commonly known as: DITROPAN-XL  Take 1 tablet (5 mg total) by mouth at bedtime.   tamsulosin 0.4 MG Caps capsule Commonly known as: FLOMAX TAKE 1 CAPSULE BY MOUTH DAILY What changed: when to take this   Triptorelin Pamoate 22.5 MG injection Commonly known as: TRELSTAR Inject 22.5 mg into the muscle every 6 (six) months.   valACYclovir 1000 MG tablet Commonly known as: VALTREX Take 1 tablet (1,000 mg total) by mouth 2 (two) times daily. What changed:   when to take this  reasons to take this        DISCHARGE INSTRUCTIONS:  See AVS.  If you experience worsening of your admission symptoms, develop shortness of breath, life threatening emergency, suicidal or homicidal thoughts you must seek medical attention immediately by calling 911 or calling your MD immediately  if symptoms less severe.  You Must read complete instructions/literature along with all the possible adverse reactions/side effects for all the Medicines you take and that have been prescribed to you. Take any new Medicines after you have completely understood and accpet all the possible adverse reactions/side effects.   Please note  You were cared for by a hospitalist during your hospital stay. If you have any questions about your discharge medications or the care you received while you were in the hospital after you are discharged, you can call the unit and asked to speak with the hospitalist on call if the hospitalist that took care of you  is not available. Once you are discharged, your primary care physician will handle any further medical issues. Please note that NO REFILLS for any discharge medications will be authorized once you are discharged, as it is imperative that you return to your primary care physician (or establish a relationship with a primary care physician if you do not have one) for your aftercare needs so that they can reassess your need for medications and monitor your lab values.    On the day of Discharge:  VITAL SIGNS:  Blood  pressure 126/68, pulse (!) 48, temperature 97.7 F (36.5 C), temperature source Oral, resp. rate 18, height 6' (1.829 m), weight 72.6 kg, SpO2 98 %. PHYSICAL EXAMINATION:  GENERAL:  68 y.o.-year-old patient lying in the bed with no acute distress.  EYES: Pupils equal, round, reactive to light and accommodation. No scleral icterus. Extraocular muscles intact.  HEENT: Head atraumatic, normocephalic. Oropharynx and nasopharynx clear.  NECK:  Supple, no jugular venous distention. No thyroid enlargement, no tenderness.  LUNGS: Normal breath sounds bilaterally, no wheezing, rales,rhonchi or crepitation. No use of accessory muscles of respiration.  CARDIOVASCULAR: S1, S2 normal. No murmurs, rubs, or gallops.  ABDOMEN: Soft, non-tender, non-distended. Bowel sounds present. No organomegaly or mass.  EXTREMITIES: No pedal edema, cyanosis, or clubbing.  NEUROLOGIC: Cranial nerves II through XII are intact. Muscle strength 5/5 in all extremities. Sensation intact. Gait not checked.  PSYCHIATRIC: The patient is alert and oriented x 3.  SKIN: No obvious rash, lesion, or ulcer.  DATA REVIEW:   CBC Recent Labs  Lab 01/25/19 2033  WBC 5.0  HGB 12.6*  HCT 38.8*  PLT 185    Chemistries  Recent Labs  Lab 01/25/19 2033  NA 141  K 4.0  CL 107  CO2 25  GLUCOSE 112*  BUN 27*  CREATININE 0.86  CALCIUM 9.2  AST 16  ALT 11  ALKPHOS 67  BILITOT 0.5     Microbiology Results  Results for orders placed or performed during the hospital encounter of 01/25/19  SARS Coronavirus 2 (CEPHEID - Performed in [place] hospital lab), Hosp Order     Status: None   Collection Time: 01/26/19  6:28 AM   Specimen: Nasopharyngeal Swab  Result Value Ref Range Status   SARS Coronavirus 2 NEGATIVE NEGATIVE Final    Comment: (NOTE) If result is NEGATIVE SARS-CoV-2 target nucleic acids are NOT DETECTED. The SARS-CoV-2 RNA is generally detectable in upper and lower  respiratory specimens during the acute phase  of infection. The lowest  concentration of SARS-CoV-2 viral copies this assay can detect is 250  copies / mL. A negative result does not preclude SARS-CoV-2 infection  and should not be used as the sole basis for treatment or other  patient management decisions.  A negative result may occur with  improper specimen collection / handling, submission of specimen other  than nasopharyngeal swab, presence of viral mutation(s) within the  areas targeted by this assay, and inadequate number of viral copies  (<250 copies / mL). A negative result must be combined with clinical  observations, patient history, and epidemiological information. If result is POSITIVE SARS-CoV-2 target nucleic acids are DETECTED. The SARS-CoV-2 RNA is generally detectable in upper and lower  respiratory specimens dur ing the acute phase of infection.  Positive  results are indicative of active infection with SARS-CoV-2.  Clinical  correlation with patient history and other diagnostic information is  necessary to determine patient infection status.  Positive results do  not  rule out bacterial infection or co-infection with other viruses. If result is PRESUMPTIVE POSTIVE SARS-CoV-2 nucleic acids MAY BE PRESENT.   A presumptive positive result was obtained on the submitted specimen  and confirmed on repeat testing.  While 2019 novel coronavirus  (SARS-CoV-2) nucleic acids may be present in the submitted sample  additional confirmatory testing may be necessary for epidemiological  and / or clinical management purposes  to differentiate between  SARS-CoV-2 and other Sarbecovirus currently known to infect humans.  If clinically indicated additional testing with an alternate test  methodology [phone]) is advised. The SARS-CoV-2 RNA is generally  detectable in upper and lower respiratory sp ecimens during the acute  phase of infection. The expected result is Negative. Fact Sheet for Patients:   StrictlyIdeas.no Fact Sheet for Healthcare Providers: BankingDealers.co.za This test is not yet approved or cleared by the [place] FDA and has been authorized for detection and/or diagnosis of SARS-CoV-2 by FDA under an Emergency Use Authorization (EUA).  This EUA will remain in effect (meaning this test can be used) for the duration of the COVID-19 declaration under Section 564(b)(1) of the Act, 21 U.S.C. section 360bbb-3(b)(1), unless the authorization is terminated or revoked sooner. Performed at [facility], [address], [place], [place] [address]     RADIOLOGY:  No results found.   Management plans discussed with the patient, family and they are in agreement.  CODE STATUS: Full Code   TOTAL TIME TAKING CARE OF THIS PATIENT: 32 minutes.    Demetrios Loll M.D on 01/27/2019 at 1:33 PM  Between 7am to 6pm - Pager - [phone]  After 6pm go to [email or web address] - Proofreader  [facility]  Office  [phone]  CC: Primary care physician; Cammie Sickle, MD   Note: This dictation was prepared with Dragon dictation along with smaller phrase technology. Any transcriptional errors that result from this process are unintentional.

## 2019-01-29 ENCOUNTER — Ambulatory Visit
Admission: RE | Admit: 2019-01-29 | Discharge: 2019-01-29 | Disposition: A | Discharge status: Home / Self Care | Payer: Medicare Other | Source: Ambulatory Visit | Attending: Radiation Oncology | Admitting: Radiation Oncology

## 2019-01-29 ENCOUNTER — Other Ambulatory Visit: Payer: Self-pay

## 2019-01-29 ENCOUNTER — Ambulatory Visit: Payer: Medicare Other

## 2019-01-29 ENCOUNTER — Telehealth: Payer: Self-pay

## 2019-01-29 DIAGNOSIS — C7951 Secondary malignant neoplasm of bone: Secondary | ICD-10-CM | POA: Diagnosis not present

## 2019-01-29 DIAGNOSIS — Z510 Encounter for antineoplastic radiation therapy: Secondary | ICD-10-CM | POA: Diagnosis not present

## 2019-01-29 DIAGNOSIS — C61 Malignant neoplasm of prostate: Secondary | ICD-10-CM | POA: Diagnosis not present

## 2019-01-29 DIAGNOSIS — C7931 Secondary malignant neoplasm of brain: Secondary | ICD-10-CM | POA: Diagnosis not present

## 2019-01-29 NOTE — Telephone Encounter (Signed)
SW called to schedule Palliative Care visit. SW left VM with contact information.

## 2019-01-30 ENCOUNTER — Other Ambulatory Visit: Payer: Self-pay | Admitting: *Deleted

## 2019-01-30 ENCOUNTER — Telehealth: Payer: Self-pay

## 2019-01-30 DIAGNOSIS — C7931 Secondary malignant neoplasm of brain: Secondary | ICD-10-CM

## 2019-01-30 NOTE — Telephone Encounter (Signed)
SW received phone call from patient to schedule appointment for Palliative Care home visit. Patient in agreement for visit on 02-03-19 at 2:00 PM.

## 2019-01-31 ENCOUNTER — Other Ambulatory Visit: Payer: Self-pay

## 2019-01-31 ENCOUNTER — Emergency Department: Payer: Medicare Other

## 2019-01-31 ENCOUNTER — Encounter: Payer: Self-pay | Admitting: Emergency Medicine

## 2019-01-31 ENCOUNTER — Emergency Department
Admission: EM | Admit: 2019-01-31 | Discharge: 2019-01-31 | Disposition: A | Discharge status: Home / Self Care | Payer: Medicare Other | Attending: Emergency Medicine | Admitting: Emergency Medicine

## 2019-01-31 DIAGNOSIS — R4020 Unspecified coma: Secondary | ICD-10-CM | POA: Diagnosis not present

## 2019-01-31 DIAGNOSIS — R410 Disorientation, unspecified: Secondary | ICD-10-CM | POA: Diagnosis not present

## 2019-01-31 DIAGNOSIS — R569 Unspecified convulsions: Secondary | ICD-10-CM | POA: Diagnosis not present

## 2019-01-31 DIAGNOSIS — R404 Transient alteration of awareness: Secondary | ICD-10-CM | POA: Diagnosis not present

## 2019-01-31 DIAGNOSIS — Z79899 Other long term (current) drug therapy: Secondary | ICD-10-CM | POA: Insufficient documentation

## 2019-01-31 LAB — CBC WITH DIFFERENTIAL/PLATELET
Abs Immature Granulocytes: 0.11 10*3/uL — ABNORMAL HIGH (ref 0.00–0.07)
Basophils Absolute: 0 10*3/uL (ref 0.0–0.1)
Basophils Relative: 1 %
Eosinophils Absolute: 0 10*3/uL (ref 0.0–0.5)
Eosinophils Relative: 1 %
HCT: 42.1 % (ref 39.0–52.0)
Hemoglobin: 14.1 g/dL (ref 13.0–17.0)
Immature Granulocytes: 2 %
Lymphocytes Relative: 24 %
Lymphs Abs: 1.4 10*3/uL (ref 0.7–4.0)
MCH: 32.4 pg (ref 26.0–34.0)
MCHC: 33.5 g/dL (ref 30.0–36.0)
MCV: 96.8 fL (ref 80.0–100.0)
Monocytes Absolute: 0.7 10*3/uL (ref 0.1–1.0)
Monocytes Relative: 11 %
Neutro Abs: 3.7 10*3/uL (ref 1.7–7.7)
Neutrophils Relative %: 61 %
Platelets: 237 10*3/uL (ref 150–400)
RBC: 4.35 MIL/uL (ref 4.22–5.81)
RDW: 13 % (ref 11.5–15.5)
WBC: 6 10*3/uL (ref 4.0–10.5)
nRBC: 0 % (ref 0.0–0.2)

## 2019-01-31 LAB — COMPREHENSIVE METABOLIC PANEL
ALT: 11 U/L (ref 0–44)
AST: 18 U/L (ref 15–41)
Albumin: 4.8 g/dL (ref 3.5–5.0)
Alkaline Phosphatase: 71 U/L (ref 38–126)
Anion gap: 16 — ABNORMAL HIGH (ref 5–15)
BUN: 26 mg/dL — ABNORMAL HIGH (ref 8–23)
CO2: 19 mmol/L — ABNORMAL LOW (ref 22–32)
Calcium: 9.6 mg/dL (ref 8.9–10.3)
Chloride: 102 mmol/L (ref 98–111)
Creatinine, Ser: 0.94 mg/dL (ref 0.61–1.24)
GFR calc Af Amer: >60 mL/min (ref >60)
GFR calc non Af Amer: >60 mL/min (ref >60)
Glucose, Bld: 99 mg/dL (ref 70–99)
Potassium: 4.2 mmol/L (ref 3.5–5.1)
Sodium: 137 mmol/L (ref 135–145)
Total Bilirubin: 0.8 mg/dL (ref 0.3–1.2)
Total Protein: 8.2 g/dL — ABNORMAL HIGH (ref 6.5–8.1)

## 2019-01-31 MED ORDER — DEXAMETHASONE 4 MG PO TABS
4.0000 mg | ORAL_TABLET | Freq: Once | ORAL | Status: AC
  Administered 2019-01-31: 4 mg via ORAL
  Filled 2019-01-31: qty 1

## 2019-01-31 MED ORDER — LEVETIRACETAM IN NACL 1000 MG/100ML IV SOLN
1000.0000 mg | Freq: Once | INTRAVENOUS | Status: AC
  Administered 2019-01-31: 17:00:00 1000 mg via INTRAVENOUS
  Filled 2019-01-31: qty 100

## 2019-01-31 MED ORDER — LEVETIRACETAM 500 MG PO TABS: 500.0000 mg | ORAL_TABLET | Freq: Two times a day (BID) | ORAL | 3 refills | Status: AC

## 2019-01-31 NOTE — ED Triage Notes (Signed)
Pt here for seizure that was 1-2 minutes per family. First seizure. Dx with brain tumors over weekend. Pt currently being tx with chemo pill for prostate and going to start RT for brain.  Tumors also on spine.

## 2019-01-31 NOTE — ED Notes (Signed)
Updated pts [name]

## 2019-01-31 NOTE — ED Provider Notes (Signed)
[facility] Emergency Department Provider Note   ____________________________________________   First MD Initiated Contact with Patient 01/31/19 1325     (approximate)  I have reviewed the triage vital signs and the nursing notes.   HISTORY  Chief Complaint Seizures    HPI William Wright is a 68 y.o. male who was diagnosed with brain metastasis admitted this month on the 11th.  He was recently discharged.  He has had treatment for the metastasis with radiotherapy.  Today he had a seizure.  He has not had seizures before.  Was postictal for some time and is now waking up.  He has some residual left-sided weakness which apparently was present earlier from the brain mets.  Patient denies any pain.  He denies any new weakness or numbness.         Past Medical History:  Diagnosis Date  . Cancer of prostate ([place]) 07/2012   OraL chemo pill.   . Family history of leukemia     Patient Active Problem List   Diagnosis Date Noted  . Metastatic cancer to brain ([place]) 01/26/2019  . Goals of care, counseling/discussion 03/20/2018  . Swelling of limb 12/21/2017  . Genetic testing 11/30/2017  . Family history of leukemia   . Thoracic aortic aneurysm without rupture ([place]) 12/22/2016  . 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 Cancer of prostate ([place]) 11/18/2014    Past Surgical History:  Procedure Laterality Date  . EXTRACORPOREAL SHOCK WAVE LITHOTRIPSY Left 05/20/2015   Procedure: EXTRACORPOREAL SHOCK WAVE LITHOTRIPSY (ESWL);  Surgeon: Royston Cowper, MD;  Location: [facility] ORS;  Service: Urology;  Laterality: Left;  . EXTRACORPOREAL SHOCK WAVE LITHOTRIPSY Right 12/21/2016   Procedure: EXTRACORPOREAL SHOCK WAVE LITHOTRIPSY (ESWL);  Surgeon: Royston Cowper, MD;  Location: [facility] ORS;  Service: Urology;  Laterality: Right;  . IR RADIOLOGIST EVAL & MGMT  02/28/2018    Prior to Admission medications   Medication Sig Start Date End Date  Taking? Authorizing Provider  dexamethasone (DECADRON) 4 MG tablet Take 1 tablet (4 mg total) by mouth 3 (three) times daily for 14 days. 01/27/19 02/10/19  Demetrios Loll, MD  gabapentin (NEURONTIN) 100 MG capsule Take 1 capsule (100 mg total) by mouth 3 (three) times daily. 11/29/18   Cammie Sickle, MD  levETIRAcetam (KEPPRA) 500 MG tablet Take 1 tablet (500 mg total) by mouth 2 (two) times daily. 01/31/19   Nena Polio, MD  oxybutynin (DITROPAN-XL) 5 MG 24 hr tablet Take 1 tablet (5 mg total) by mouth at bedtime. 01/11/18   Cammie Sickle, MD  tamsulosin (FLOMAX) 0.4 MG CAPS capsule TAKE 1 CAPSULE BY MOUTH DAILY Patient taking differently: Take 0.4 mg by mouth at bedtime.  01/15/19   Cammie Sickle, MD  Triptorelin Pamoate (TRELSTAR) 22.5 MG injection Inject 22.5 mg into the muscle every 6 (six) months.    [provider]  valACYclovir (VALTREX) 1000 MG tablet Take 1 tablet (1,000 mg total) by mouth 2 (two) times daily. Patient taking differently: Take 1,000 mg by mouth 2 (two) times daily as needed (fever blister).  08/02/16   Cammie Sickle, MD    Allergies No known allergies  Family History  Problem Relation Age of Onset  . Alzheimer's disease Father   . Lung cancer Maternal Grandfather 1  . Leukemia Paternal Grandmother 32  . COPD Paternal Grandfather   . Cancer Maternal Uncle        type unk, dx 80's  Social History Social History   Tobacco Use  . Smoking status: Never Smoker  . Smokeless tobacco: Never Used  Substance Use Topics  . Alcohol use: No  . Drug use: No    Review of Systems  Constitutional: No fever/chills Eyes: No visual changes. ENT: No sore throat. Cardiovascular: Denies chest pain. Respiratory: Denies shortness of breath. Gastrointestinal: No abdominal pain.  No nausea, no vomiting.  No diarrhea.  No constipation. Genitourinary: Negative for dysuria. Musculoskeletal: Negative for back pain. Skin: Negative for rash.  Neurological: Negative for headaches, focal weakness   ____________________________________________   PHYSICAL EXAM:  VITAL SIGNS: ED Triage Vitals  Enc Vitals Group     BP 01/31/19 1319 140/81     Pulse Rate 01/31/19 1319 79     Resp 01/31/19 1319 12     Temp 01/31/19 1319 98.3 F (36.8 C)     Temp Source 01/31/19 1319 Oral     SpO2 01/31/19 1319 100 %     Weight 01/31/19 1317 160 lb 0.9 oz (72.6 kg)     Height 01/31/19 1317 6' (1.829 m)     Head Circumference --      Peak Flow --      Pain Score 01/31/19 1316 0     Pain Loc --      Pain Edu? --      Excl. in [place]? --     Constitutional: Alert and oriented. Well appearing and in no acute distress. Eyes: Conjunctivae are normal. Head: Atraumatic. Nose: No congestion/rhinnorhea. Mouth/Throat: Mucous membranes are moist.  Oropharynx non-erythematous. Neck: No stridor.   Cardiovascular: Normal rate, regular rhythm. Grossly normal heart sounds.  Good peripheral circulation. Respiratory: Normal respiratory effort.  No retractions. Lungs CTAB. Gastrointestinal: Soft and nontender. No distention. No abdominal bruits. No CVA tenderness. Musculoskeletal: No lower extremity tenderness nor edema.  No joint effusions. Neurologic:  Normal speech and language.  Patient with some left arm weakness and slight inattention.  He has to be reminded to squeeze with his left hand.  Finger-to-nose is normal with the left hand though.  Left leg also seems to be slightly weak.  Patient reports this is old. Skin:  Skin is warm, dry and intact. No rash noted.   ____________________________________________   LABS (all labs ordered are listed, but only abnormal results are displayed)  Labs Reviewed  COMPREHENSIVE METABOLIC PANEL - Abnormal; Notable for the following components:      Result Value   CO2 19 (*)    BUN 26 (*)    Total Protein 8.2 (*)    Anion gap 16 (*)    All other components within normal limits  CBC WITH DIFFERENTIAL/PLATELET  - Abnormal; Notable for the following components:   Abs Immature Granulocytes 0.11 (*)    All other components within normal limits   ____________________________________________  EKG EKG read interpreted by me shows normal sinus rhythm rate of 78 left axis decreased R wave progression no acute ST-T changes ____________________________________________  RADIOLOGY  ED MD interpretation:   Official radiology report(s): Ct Head Wo Contrast  Result Date: 01/31/2019 CLINICAL DATA:  Seizure. Diagnosed with brain tumors over the weekend. History of prostate cancer on chemotherapy. EXAM: CT HEAD WITHOUT CONTRAST TECHNIQUE: Contiguous axial images were obtained from the base of the skull through the vertex without intravenous contrast. COMPARISON:  MRI 01/26/2019 and CT 01/25/2019 FINDINGS: Brain: Examination again demonstrates 2 known cystic masses over the right frontal and right parietal region with associated vasogenic edema. There  is mild local mass effect and minimal midline shift to the left of 3 mm as these findings are unchanged. No new masses. No evidence of acute hemorrhage or acute infarction. Prominent perivascular space inferior to the left lentiform nucleus. Vascular: No hyperdense vessel or unexpected calcification. Skull: Normal. Negative for fracture or focal lesion. Sinuses/Orbits: No acute finding. Other: None. IMPRESSION: Stable findings demonstrating to known cystic masses over the right frontal and parietal regions with local mass effect and minimal midline shift to the left. Electronically Signed   By: Marin Olp M.D.   On: 01/31/2019 14:11    ____________________________________________   PROCEDURES  Procedure(s) performed (including Critical Care):  Procedures   ____________________________________________   INITIAL IMPRESSION / ASSESSMENT AND PLAN / ED COURSE SAFWAN TOMEI was evaluated in Emergency Department on 01/31/2019 for the symptoms described in the  history of present illness. He was evaluated in the context of the global COVID-19 pandemic, which necessitated consideration that the patient might be at risk for infection with the SARS-CoV-2 virus that causes COVID-19. Institutional protocols and algorithms that pertain to the evaluation of patients at risk for COVID-19 are in a state of rapid change based on information released by regulatory bodies including the CDC and federal and state organizations. These policies and algorithms were followed during the patient's care in the ED.     Discussed patient with Dr. Doy Mince.  She advises Keppra 500 twice daily with the 1 g IV load.  Follow-up outpatient.  Patient is stable he looks well at this point.  We will plan on letting him go.        ____________________________________________   FINAL CLINICAL IMPRESSION(S) / ED DIAGNOSES  Final diagnoses:  Seizure [facility])     ED Discharge Orders         Ordered    levETIRAcetam (KEPPRA) 500 MG tablet  2 times daily     01/31/19 1609           Note:  This document was prepared using Dragon voice recognition software and may include unintentional dictation errors.    Nena Polio, MD 01/31/19 [phone]

## 2019-01-31 NOTE — Discharge Instructions (Addendum)
The seizure you had today is likely due to the brain metastasis.  Please follow-up with your doctors.  Please also follow-up with neurology.  You can call Dr. Manuella Ghazi or Dr. Melrose Nakayama.  They work together and are both very good.  I talked to the neurologist on call today.  She wants you to start taking Keppra 1 pill twice a day.  This is a seizure medicine.  We will give you a dose IV here and then take the first dose this evening.

## 2019-01-31 NOTE — ED Notes (Signed)
Assisted pt to urinate.

## 2019-01-31 NOTE — ED Notes (Signed)
Patient transported to CT 

## 2019-01-31 NOTE — ED Notes (Signed)
Waiting on meds from pharmacy 

## 2019-01-31 NOTE — ED Notes (Signed)
Pt urinated in bed. Shorts removed and placed in bag, cleaned so dry. Urinal remains at bedside.

## 2019-01-31 NOTE — ED Notes (Signed)
Urinal at bedside.  

## 2019-02-03 ENCOUNTER — Ambulatory Visit
Admission: RE | Admit: 2019-02-03 | Discharge: 2019-02-03 | Disposition: A | Discharge status: Home / Self Care | Payer: Medicare Other | Source: Ambulatory Visit | Attending: Radiation Oncology | Admitting: Radiation Oncology

## 2019-02-03 ENCOUNTER — Other Ambulatory Visit: Payer: Medicare Other

## 2019-02-03 ENCOUNTER — Ambulatory Visit: Payer: Medicare Other

## 2019-02-03 ENCOUNTER — Inpatient Hospital Stay: Payer: Medicare Other | Attending: Internal Medicine | Admitting: Internal Medicine

## 2019-02-03 ENCOUNTER — Other Ambulatory Visit: Payer: Self-pay

## 2019-02-03 DIAGNOSIS — R630 Anorexia: Secondary | ICD-10-CM | POA: Diagnosis not present

## 2019-02-03 DIAGNOSIS — C7951 Secondary malignant neoplasm of bone: Secondary | ICD-10-CM | POA: Diagnosis not present

## 2019-02-03 DIAGNOSIS — Z79818 Long term (current) use of other agents affecting estrogen receptors and estrogen levels: Secondary | ICD-10-CM | POA: Insufficient documentation

## 2019-02-03 DIAGNOSIS — R5383 Other fatigue: Secondary | ICD-10-CM | POA: Insufficient documentation

## 2019-02-03 DIAGNOSIS — C61 Malignant neoplasm of prostate: Secondary | ICD-10-CM | POA: Diagnosis not present

## 2019-02-03 DIAGNOSIS — Z79899 Other long term (current) drug therapy: Secondary | ICD-10-CM | POA: Insufficient documentation

## 2019-02-03 DIAGNOSIS — C7931 Secondary malignant neoplasm of brain: Secondary | ICD-10-CM

## 2019-02-03 DIAGNOSIS — Z510 Encounter for antineoplastic radiation therapy: Secondary | ICD-10-CM | POA: Diagnosis not present

## 2019-02-03 DIAGNOSIS — R5381 Other malaise: Secondary | ICD-10-CM | POA: Insufficient documentation

## 2019-02-03 DIAGNOSIS — G4089 Other seizures: Secondary | ICD-10-CM | POA: Diagnosis not present

## 2019-02-03 DIAGNOSIS — Z515 Encounter for palliative care: Secondary | ICD-10-CM

## 2019-02-03 DIAGNOSIS — R911 Solitary pulmonary nodule: Secondary | ICD-10-CM | POA: Diagnosis not present

## 2019-02-03 NOTE — Progress Notes (Signed)
Patient here for hospital f/u with discharge on 01/27/19.  He went back to ER 01/31/19 with seizure activity.  Left sided weakness has not improved.

## 2019-02-03 NOTE — Progress Notes (Signed)
COMMUNITY PALLIATIVE CARE SW NOTE  PATIENT NAME: William Wright DOB: [birth year] MRN: [id number]  PRIMARY CARE PROVIDER: Cammie Sickle, MD  RESPONSIBLE PARTY:  Acct ID - Guarantor Home Phone Work Phone Relationship Acct Type  [id number] [name]  Self P/F     [address], [place], [place] [address]     PLAN OF CARE and INTERVENTIONS:             1. GOALS OF CARE/ ADVANCE CARE PLANNING:  Patient wants to remain at home. Patient has Living Will and HCPOA, SW to discuss more advance care planing at next visit. 2. SOCIAL/EMOTIONAL/SPIRITUAL ASSESSMENT/ INTERVENTIONS:  SW and RN met with patient and patient's wife, [name]. Patient reports feeling tired, but overall "fine". Patient denies pain and acknowledged this as a positive. Patient had to go to the ER this past weekend following a seizure. Patient now takes seizure medication, and has had no more seizures. Patient is now weaker on his left side. Patient and [name] deny any falls but patient said he has felt dizzy and unsteady. Patient reports that he feels more tired and naps on and off throughout the day. Patient did not seem as talkative as previous palliative visits. 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   3. PATIENT/CAREGIVER EDUCATION/ COPING:  Patient seemed to be in a good mood, smiling and attentive to staff. When discussing patient's prognosis and treatment plan, patient wants to continue radiation but is unsure of chemotherapy because he does not want to "feel bad". Patient and [name] plan to continue researching options. Patient and [name] seem to be processing their feelings around patient's new diagnosis of metastatic cancer to the brain, and the physical and cognitive changes that have occurred. SW to continue to provide emotional support. 4. PERSONAL EMERGENCY PLAN:  Family would call 9-1-1 for  emergencies. Due to COVID-19, patient is limiting contacts and only going out for appointments. 5. COMMUNITY RESOURCES COORDINATION/ HEALTH CARE NAVIGATION:  Patient is scheduled to see neurologist next week. Patient will see oncologist and Altha Harm, NP on 8/3. RN contacted Altha Harm, NP during visit to request more discussions of treatment options with patient and to ensure that cancer center staff called [name] on speaker during patient visits since she is still not allowed to go in. [name] is now assisting with managing and scheduling patient's appointments. Patient will be going to the cancer center daily for radiation. 6. FINANCIAL/LEGAL CONCERNS/INTERVENTIONS:  None at this time.     SOCIAL HX:  Social History   Tobacco Use  . Smoking status: Never Smoker  . Smokeless tobacco: Never Used  Substance Use Topics  . Alcohol use: No    CODE STATUS:   Code Status: Prior  ADVANCED DIRECTIVES: N MOST FORM COMPLETE:  No. HOSPICE EDUCATION PROVIDED: None.  PPS: Patient is independent of most ADLs but does need assistance with dressing. Patient has started to use a walking stick to help with balance.  I spent60 minutes with this patient from2:00-3:00pproviding education, consultation and support.   Margaretmary Lombard, LCSW

## 2019-02-03 NOTE — Progress Notes (Signed)
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   # MARCH 2019- Taken off trial [ based on interm analysis]; continue X-tandi [given not needing steroids]  # July 2019- CT/bone scan stable; MRI lumbar spine- epidural extension/L3 spinal nerves involvement; on RT [finish sep 12th];  #May 28 PET scan-progressive disease; patient continues Xtandi/reluctant with other options.  #July 10th-  2020-brain metastases; [facility] neurosurgery evaluation; stop Xtandi.  S July 17 seizure; July 20-th whole brain radiation  # vertebroplasty [[facility]] on sep 13th 2019.  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/p Bx- Cryptococcus- ID/Dr.Fitzgerald- on diflucan  # Xgeva  # MOLECULAR TESTING/omniseq- BRCA-2 MUTATED; PDL-1- NEG; MSS; N-TRK-NEG  # GENETICS- BRCA-NEG; hetrozygous MUTHY; recommended family work up --------------------------------------------------    DIAGNOSIS: [ ] Metastatic prostate cancer  STAGE: 4    ;GOALS: Palliative  CURRENT/MOST RECENT THERAPY-X-tandi+ Trelstar    [facility]  08/17/2012 Initial Diagnosis   Cancer of prostate, stage II   12/03/2013 Progression     10/13/2014 Progression     Prostate cancer metastatic to multiple sites ([facility]      INTERVAL HISTORY:  [name]89y.o.  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   Patient was recently admitted to hospital for left upper extremity weakness/facial droop; imaging including a CT scan and MRI showed 3 lesions in the brain largest about 4 to 5 cm in size cystic nature consistent with metastasis.  Patient was evaluated by radiation awaiting to start whole brain radiation.  Patient was instructed to stop taking Xtandi.  [facility] declined any [facility] options.  However over the weekend patient was again admitted to hospital for episode of seizure.  He cannot tell me any further details as he has not.  Patient was again admitted to the hospital/started on steroids/Keppra.  He is awaiting to see radiation this afternoon.  States his pain in the back/hips is stable.  Not any worse.  No falls.  Poor appetite.   Review of Systems  Constitutional: Positive for malaise/fatigue. Negative for chills, diaphoresis and fever.  HENT: Negative for nosebleeds and sore throat.   Eyes: Negative for double vision.  Respiratory: Negative for cough, hemoptysis, sputum production, shortness of breath and wheezing.   Cardiovascular: Negative for chest pain, palpitations, orthopnea and leg swelling.  Gastrointestinal: Negative for abdominal pain, blood in stool, constipation, diarrhea, heartburn, melena, nausea and vomiting.  Genitourinary: Negative for dysuria, frequency and urgency.  Musculoskeletal: Positive for back pain and joint pain.  Skin: Negative.  Negative for itching and rash.  Neurological: Negative for dizziness, focal weakness, weakness and headaches.  Endo/Heme/Allergies: Does not bruise/bleed easily.  Psychiatric/Behavioral: Negative for depression. The patient does not have insomnia.  PAST MEDICAL HISTORY :  Past Medical History:   Diagnosis Date  . Cancer of prostate ([place]) 07/2012   OraL chemo pill.   . Family history of leukemia     PAST SURGICAL HISTORY :   Past Surgical History:  Procedure Laterality Date  . EXTRACORPOREAL SHOCK WAVE LITHOTRIPSY Left 05/20/2015   Procedure: EXTRACORPOREAL SHOCK WAVE LITHOTRIPSY (ESWL);  Surgeon: William Cowper, MD;  Location: [facility] ORS;  Service: Urology;  Laterality: Left;  . EXTRACORPOREAL SHOCK WAVE LITHOTRIPSY Right 12/21/2016   Procedure: EXTRACORPOREAL SHOCK WAVE LITHOTRIPSY (ESWL);  Surgeon: William Cowper, MD;  Location: [facility] ORS;  Service: Urology;  Laterality: Right;  . IR RADIOLOGIST EVAL & MGMT  02/28/2018    FAMILY HISTORY :   Family History  Problem Relation Age of Onset  . Alzheimer's disease Father   . Lung cancer Maternal Grandfather 31  . Leukemia Paternal Grandmother 35  . COPD Paternal Grandfather   . Cancer Maternal Uncle        type unk, dx 80's    SOCIAL HISTORY:   Social History   Tobacco Use  . Smoking status: Never Smoker  . Smokeless tobacco: Never Used  Substance Use Topics  . Alcohol use: No  . Drug use: No    ALLERGIES:  is allergic to no known allergies.  MEDICATIONS:  Current Outpatient Medications  Medication Sig Dispense Refill  . dexamethasone (DECADRON) 4 MG tablet Take 1 tablet (4 mg total) by mouth 3 (three) times daily for 14 days. 42 tablet 0  . gabapentin (NEURONTIN) 100 MG capsule Take 1 capsule (100 mg total) by mouth 3 (three) times daily. 90 capsule 3  . levETIRAcetam (KEPPRA) 500 MG tablet Take 1 tablet (500 mg total) by mouth 2 (two) times daily. 60 tablet 3  . oxybutynin (DITROPAN-XL) 5 MG 24 hr tablet Take 1 tablet (5 mg total) by mouth at bedtime. 90 tablet 3  . tamsulosin (FLOMAX) 0.4 MG CAPS capsule TAKE 1 CAPSULE BY MOUTH DAILY (Patient taking differently: Take 0.4 mg by mouth at bedtime. ) 90 capsule 3  . Triptorelin Pamoate (TRELSTAR) 22.5 MG injection Inject 22.5 mg into the muscle every 6 (six) months.     . valACYclovir (VALTREX) 1000 MG tablet Take 1 tablet (1,000 mg total) by mouth 2 (two) times daily. (Patient taking differently: Take 1,000 mg by mouth 2 (two) times daily as needed (fever blister). ) 60 tablet 6   No current facility-administered medications for this visit.     PHYSICAL EXAMINATION: ECOG PERFORMANCE STATUS: 1 - Symptomatic but completely ambulatory  BP 130/80   Pulse (!) 47   Temp 97.8 F (36.6 C)   Resp 16   Wt 159 lb 3.2 oz (72.2 kg)   BMI 21.59 kg/m   Filed Weights   02/03/19 0958  Weight: 159 lb 3.2 oz (72.2 kg)    Physical Exam  Constitutional: He is oriented to person, place, and time and well-developed, well-nourished, and in no distress.  Alone.  Walking by himself.  HENT:  Head: Normocephalic and atraumatic.  Mouth/Throat: Oropharynx is clear and moist. No oropharyngeal exudate.  Eyes: Pupils are equal, round, and reactive to light.  Neck: Normal range of motion. Neck supple.  Cardiovascular: Normal rate and regular rhythm.  Pulmonary/Chest: No respiratory distress. He has no wheezes.  Abdominal: Soft. Bowel sounds are normal. He exhibits no distension and no mass. There is no abdominal tenderness. There is no rebound and no guarding.  Musculoskeletal: Normal range  of motion.        General: No tenderness or edema.  Neurological: He is alert and oriented to person, place, and time.  Skin: Skin is warm.  Psychiatric: Affect normal.       LABORATORY DATA:  I have reviewed the data as listed    Component Value Date/Time   NA 137 01/31/2019 1335   NA 137 11/12/2014 1415   K 4.2 01/31/2019 1335   K 4.1 11/12/2014 1415   CL 102 01/31/2019 1335   CL 105 11/12/2014 1415   CO2 19 (L) 01/31/2019 1335   CO2 24 11/12/2014 1415   GLUCOSE 99 01/31/2019 1335   GLUCOSE 98 11/12/2014 1415   BUN 26 (H) 01/31/2019 1335   BUN 24 (H) 11/12/2014 1415   CREATININE 0.94 01/31/2019 1335   CREATININE 1.02 11/12/2014 1415   CALCIUM 9.6 01/31/2019 1335    CALCIUM 9.2 11/12/2014 1415   PROT 8.2 (H) 01/31/2019 1335   PROT 7.4 11/12/2014 1415   ALBUMIN 4.8 01/31/2019 1335   ALBUMIN 4.4 11/12/2014 1415   AST 18 01/31/2019 1335   AST 23 11/12/2014 1415   ALT 11 01/31/2019 1335   ALT 16 (L) 11/12/2014 1415   ALKPHOS 71 01/31/2019 1335   ALKPHOS 103 11/12/2014 1415   BILITOT 0.8 01/31/2019 1335   BILITOT 0.4 11/12/2014 1415   GFRNONAA >60 01/31/2019 1335   GFRNONAA >60 11/12/2014 1415   GFRAA >60 01/31/2019 1335   GFRAA >60 11/12/2014 1415    No results found for: SPEP, UPEP  Lab Results  Component Value Date   WBC 6.0 01/31/2019   NEUTROABS 3.7 01/31/2019   HGB 14.1 01/31/2019   HCT 42.1 01/31/2019   MCV 96.8 01/31/2019   PLT 237 01/31/2019      Chemistry      Component Value Date/Time   NA 137 01/31/2019 1335   NA 137 11/12/2014 1415   K 4.2 01/31/2019 1335   K 4.1 11/12/2014 1415   CL 102 01/31/2019 1335   CL 105 11/12/2014 1415   CO2 19 (L) 01/31/2019 1335   CO2 24 11/12/2014 1415   BUN 26 (H) 01/31/2019 1335   BUN 24 (H) 11/12/2014 1415   CREATININE 0.94 01/31/2019 1335   CREATININE 1.02 11/12/2014 1415      Component Value Date/Time   CALCIUM 9.6 01/31/2019 1335   CALCIUM 9.2 11/12/2014 1415   ALKPHOS 71 01/31/2019 1335   ALKPHOS 103 11/12/2014 1415   AST 18 01/31/2019 1335   AST 23 11/12/2014 1415   ALT 11 01/31/2019 1335   ALT 16 (L) 11/12/2014 1415   BILITOT 0.8 01/31/2019 1335   BILITOT 0.4 11/12/2014 1415       RADIOGRAPHIC STUDIES: I have personally reviewed the radiological images as listed and agreed with the findings in the report. No results found.   ASSESSMENT & PLAN:  Prostate cancer metastatic to multiple sites [facility]) # Castrate resistant prostate cancer-stage IV; on Trelstar q 46M [09/27/2018]; most recently on Xtandi currently discontinued.  May 28 PET- Progression; clinically progressing/worsening; with new brain metastasis [see below]  #Again reviewed the treatment options the  patient including but not limited to-chemotherapy/BRCA inhibitor.  Patient not a candidate for Sipuleucel-T.  [See discussion below] patient still noncommittal about his  # Brain metastasis/causing seizure.  Currently on Dex/ keppra. Spoke to Dr.Crystal.   # Bone mets/pain second malignancy stable.  Continue Xgeva.;   #Goals of care: Discussed treatments are palliative; patient awaiting cardiac evaluation at home  today.  # DISPOSITION: # follow up in 2 weeks/labs-cbc/cmp- Dr.B  #Addendum: Spoke to patient's [name]-overall poor prognosis/especially with his reluctance with the treatment options.  Again reviewed the above options with the patient's wife.  She is interested in BRCA inhibitor- Rubraca.  Discussed the potential side effects including but not limited to-diarrhea/anemia etc.  Reminded her to let us know as soon as possible with her interested in proceeding with the treatment given the logistics involved.  #Also inquired about CODE STATUS/as per the wife it was not discussed during the palliative care meeting today.  I discussed the concept of CODE STATUS with the wife.  She will bring this up with her husband.  We will have patient evaluated by palliative care at next visit.  # 40 minutes face-to-face with the patient discussing the above plan of care; more than 50% of time spent on prognosis/ natural history; counseling and coordination.     Orders Placed This Encounter  Procedures  . CBC with Differential    Standing Status:   Future    Standing Expiration Date:   02/03/2020  . Comprehensive metabolic panel    Standing Status:   Future    Standing Expiration Date:   02/03/2020   All questions were answered. The patient knows to call the clinic with any problems, questions or concerns.      William Sickle, MD 02/04/2019 1:50 PM

## 2019-02-03 NOTE — Progress Notes (Signed)
PATIENT NAME: William Wright DOB: 1950-09-04 MRN: [id number]  PRIMARY CARE PROVIDER: Cammie Sickle, MD  RESPONSIBLE PARTY:  Acct ID - Guarantor Home Phone Work Phone Relationship Acct Type  [id number] William [phone]  Self P/F     [address] [place], [place] [address]    PLAN OF CARE and INTERVENTIONS:               1.  GOALS OF CARE/ ADVANCE CARE PLANNING:  Remain at home with wife with symptoms managed. Patient struggling with making a decision regarding care/ receiving chemo.               2.  PATIENT/CAREGIVER EDUCATION:  Education on seizure precautions, support and education to patient and wife on disease progression.               3.  DISEASE STATUS: SW and RN made joint visit to home.  Patient saw Dr. [name] and Dr [name] today.  Patient's disease is progressing.  Patient went to the ER via EMS on Saturday.  Patient has brain mets. Patient started on Dexamethasone and Keppra for seizures.  [name] reports the only meds patient is taking is Dexamethasone, Keppra and Colace.  Patient's pain has improved and patient is not complaining of left leg pain.  Wife feels brain mets may be altering patient's sensation of pain. Patient having increasing weakness on left side and wife having to assist patient with dressing and putting on shoes. Palliative Care NP William Wright called RN prior to visit and informed RN he will be seeing patient at the [facility]. RN called William during visit to discuss more options for treatment.  William to also request Dr [name] call wife during visit as patient is having difficulty with memory.  William Wright states he will plan on seeing patient 02/16/19 and will call wife during patient's visit.  Patient is sleeping more.  Patient and wife report patient's appetite is good.  Patient and wife planin gon going to the beach this weekend.  RN didn ot take vitals at visit today as patient states he had vital signs checked 2 times at the cancer center.   Support provided to patient and wife and both were encouraged to call with questions or concerns.                HISTORY OF PRESENT ILLNESS:    CODE STATUS: Full Code  ADVANCED DIRECTIVES: Y MOST FORM: No PPS: 50%   PHYSICAL EXAM:   VITALS: Vital signs not taken at visit, patient stated he had vital signs taken 2 times at the cancer center  LUNGS: patient denies cough or shortness of breath CARDIAC: regular rate and rhythm  EXTREMITIES: none edema SKIN: Skin color, texture, turgor normal. No rashes or lesions  NEURO: positive for gait problems, headaches and seizures       William Simmer, RN

## 2019-02-03 NOTE — Assessment & Plan Note (Addendum)
#  Castrate resistant prostate cancer-stage IV; on Trelstar q 79M [09/27/2018]; most recently on Xtandi currently discontinued.  May 28 PET- Progression; clinically progressing/worsening; with new brain metastasis [see below]  #Again reviewed the treatment options the patient including but not limited to-chemotherapy/BRCA inhibitor.  Patient not a candidate for Sipuleucel-T.  [See discussion below] patient still noncommittal about his  # Brain metastasis/causing seizure.  Currently on Dex/ keppra. Spoke to Dr.Crystal.   # Bone mets/pain second malignancy stable.  Continue Xgeva.;   #Goals of care: Discussed treatments are palliative; patient awaiting cardiac evaluation at home today.  # DISPOSITION: # follow up in 2 weeks/labs-cbc/cmp- Dr.B  #Addendum: Spoke to patient's wife Sherry-overall poor prognosis/especially with his reluctance with the treatment options.  Again reviewed the above options with the patient's wife.  She is interested in BRCA inhibitor- Rubraca.  Discussed the potential side effects including but not limited to-diarrhea/anemia etc.  Reminded her to let us know as soon as possible with her interested in proceeding with the treatment given the logistics involved.  #Also inquired about CODE STATUS/as per the wife it was not discussed during the palliative care meeting today.  I discussed the concept of CODE STATUS with the wife.  She will bring this up with her husband.  We will have patient evaluated by palliative care at next visit.  # 40 minutes face-to-face with the patient discussing the above plan of care; more than 50% of time spent on prognosis/ natural history; counseling and coordination.

## 2019-02-04 ENCOUNTER — Ambulatory Visit: Payer: Medicare Other

## 2019-02-04 ENCOUNTER — Other Ambulatory Visit: Payer: Self-pay

## 2019-02-04 ENCOUNTER — Telehealth: Payer: Self-pay | Admitting: *Deleted

## 2019-02-04 ENCOUNTER — Ambulatory Visit
Admission: RE | Admit: 2019-02-04 | Discharge: 2019-02-04 | Disposition: A | Discharge status: Home / Self Care | Payer: Medicare Other | Source: Ambulatory Visit | Attending: Radiation Oncology | Admitting: Radiation Oncology

## 2019-02-04 DIAGNOSIS — Z510 Encounter for antineoplastic radiation therapy: Secondary | ICD-10-CM | POA: Diagnosis not present

## 2019-02-04 DIAGNOSIS — C61 Malignant neoplasm of prostate: Secondary | ICD-10-CM | POA: Diagnosis not present

## 2019-02-04 DIAGNOSIS — C7931 Secondary malignant neoplasm of brain: Secondary | ICD-10-CM | POA: Diagnosis not present

## 2019-02-04 DIAGNOSIS — C7951 Secondary malignant neoplasm of bone: Secondary | ICD-10-CM | POA: Diagnosis not present

## 2019-02-04 NOTE — Telephone Encounter (Signed)
Wife called and states that William Wright has decided to try the [place] discussed yesterday

## 2019-02-05 ENCOUNTER — Other Ambulatory Visit: Payer: Self-pay | Admitting: Internal Medicine

## 2019-02-05 ENCOUNTER — Telehealth: Payer: Self-pay | Admitting: Pharmacist

## 2019-02-05 ENCOUNTER — Ambulatory Visit: Payer: Medicare Other

## 2019-02-05 ENCOUNTER — Other Ambulatory Visit: Payer: Self-pay

## 2019-02-05 ENCOUNTER — Encounter: Payer: Self-pay | Admitting: Internal Medicine

## 2019-02-05 ENCOUNTER — Ambulatory Visit
Admission: RE | Admit: 2019-02-05 | Discharge: 2019-02-05 | Disposition: A | Discharge status: Home / Self Care | Payer: Medicare Other | Source: Ambulatory Visit | Attending: Radiation Oncology | Admitting: Radiation Oncology

## 2019-02-05 DIAGNOSIS — R569 Unspecified convulsions: Secondary | ICD-10-CM | POA: Diagnosis not present

## 2019-02-05 DIAGNOSIS — C61 Malignant neoplasm of prostate: Secondary | ICD-10-CM | POA: Diagnosis not present

## 2019-02-05 DIAGNOSIS — C7951 Secondary malignant neoplasm of bone: Secondary | ICD-10-CM | POA: Diagnosis not present

## 2019-02-05 DIAGNOSIS — C7931 Secondary malignant neoplasm of brain: Secondary | ICD-10-CM | POA: Diagnosis not present

## 2019-02-05 DIAGNOSIS — R9089 Other abnormal findings on diagnostic imaging of central nervous system: Secondary | ICD-10-CM | POA: Diagnosis not present

## 2019-02-05 DIAGNOSIS — Z510 Encounter for antineoplastic radiation therapy: Secondary | ICD-10-CM | POA: Diagnosis not present

## 2019-02-05 MED ORDER — RUCAPARIB CAMSYLATE 300 MG PO TABS: 600.0000 mg | ORAL_TABLET | Freq: Two times a day (BID) | ORAL | 3 refills | Status: DC

## 2019-02-05 NOTE — Progress Notes (Signed)
Printed script for rubraca.

## 2019-02-05 NOTE — Telephone Encounter (Signed)
Oral Oncology Pharmacist Encounter  Received new prescription for Rubraca (rucaparib) for the treatment of metastatic castrate resistant prostate, BRCA somatic positive, planned duration until disease progression or unacceptable drug toxicity.  CBC from 01/31/2019 assessed, no relevant lab abnormalities. Prescription dose and frequency assessed.   Current medication list in Epic reviewed, no DDIs with rucaparib identified.  Prescription has been e-scribed to the [facility] for benefits analysis and approval.  Oral Oncology Clinic will continue to follow for insurance authorization, copayment issues, initial counseling and start date.  Darl Pikes, PharmD, BCPS, [facility] Hematology/Oncology Clinical Pharmacist [facility] Oral [place] [facility] [phone]  02/05/2019 4:33 PM

## 2019-02-05 NOTE — Telephone Encounter (Signed)
md will send script for rubraca

## 2019-02-06 ENCOUNTER — Other Ambulatory Visit: Payer: Self-pay

## 2019-02-06 ENCOUNTER — Telehealth: Payer: Self-pay | Admitting: Pharmacist

## 2019-02-06 ENCOUNTER — Ambulatory Visit
Admission: RE | Admit: 2019-02-06 | Discharge: 2019-02-06 | Disposition: A | Discharge status: Home / Self Care | Payer: Medicare Other | Source: Ambulatory Visit | Attending: Radiation Oncology | Admitting: Radiation Oncology

## 2019-02-06 DIAGNOSIS — Z510 Encounter for antineoplastic radiation therapy: Secondary | ICD-10-CM | POA: Diagnosis not present

## 2019-02-06 DIAGNOSIS — C61 Malignant neoplasm of prostate: Secondary | ICD-10-CM | POA: Diagnosis not present

## 2019-02-06 DIAGNOSIS — C7951 Secondary malignant neoplasm of bone: Secondary | ICD-10-CM | POA: Diagnosis not present

## 2019-02-06 DIAGNOSIS — C7931 Secondary malignant neoplasm of brain: Secondary | ICD-10-CM | POA: Diagnosis not present

## 2019-02-06 MED ORDER — RUCAPARIB CAMSYLATE 300 MG PO TABS: 600.0000 mg | ORAL_TABLET | Freq: Two times a day (BID) | ORAL | 3 refills | Status: AC

## 2019-02-06 NOTE — Telephone Encounter (Signed)
Oral Oncology Pharmacist Encounter   Received notification from [employer] that prior authorization for Rubraca is required.   PA submitted on CMM Key [id number]  Status is pending   Oral Oncology Clinic will continue to follow.   Darl Pikes, PharmD, BCPS, [facility] Hematology/Oncology Clinical Pharmacist [facility] Oral [place] [facility] [phone]  02/06/2019 4:38 PM

## 2019-02-07 ENCOUNTER — Telehealth: Payer: Self-pay

## 2019-02-07 ENCOUNTER — Other Ambulatory Visit: Payer: Self-pay

## 2019-02-07 ENCOUNTER — Ambulatory Visit
Admission: RE | Admit: 2019-02-07 | Discharge: 2019-02-07 | Disposition: A | Discharge status: Home / Self Care | Payer: Medicare Other | Source: Ambulatory Visit | Attending: Radiation Oncology | Admitting: Radiation Oncology

## 2019-02-07 DIAGNOSIS — C7951 Secondary malignant neoplasm of bone: Secondary | ICD-10-CM | POA: Diagnosis not present

## 2019-02-07 DIAGNOSIS — C61 Malignant neoplasm of prostate: Secondary | ICD-10-CM | POA: Diagnosis not present

## 2019-02-07 DIAGNOSIS — C7931 Secondary malignant neoplasm of brain: Secondary | ICD-10-CM | POA: Diagnosis not present

## 2019-02-07 DIAGNOSIS — Z510 Encounter for antineoplastic radiation therapy: Secondary | ICD-10-CM | POA: Diagnosis not present

## 2019-02-07 NOTE — Telephone Encounter (Signed)
Oral Oncology Pharmacist Encounter   Prior Authorization for William Wright has been approved.   PA# [id number] Effective dates: 11/09/2018 through 02/07/2020   Oral Oncology Clinic will continue to follow.   Darl Pikes, PharmD, BCPS. [facility] Hematology/Oncology Clinical Pharmacist [facility]/AP Oral Chemotherapy [facility] [phone]  02/07/2019 12:46 PM

## 2019-02-07 NOTE — Telephone Encounter (Signed)
Oral Chemotherapy Pharmacist Encounter   Spoke with Dr. [name] and the plan is to go ahead and send Mr. Keena his Garnetta Buddy from [place]. He will hold on starting the Rubraca until he finishes his radiation. I called about spoke with Mrs. Starlin to review the plan with her. She stated her understanding and knows to hold on starting the Rubraca until given the go ahead by Dr. [name].   I set-up them up with medication shipment from [place]. Expected delivery 02/11/2019.  Darl Pikes, PharmD, BCPS, [facility] Hematology/Oncology Clinical Pharmacist [facility] Oral [place] [facility] [phone]  02/07/2019 12:54 PM

## 2019-02-07 NOTE — Telephone Encounter (Signed)
Telephone call to patient's cell phone.  Patient did not answer phone.  RN left message requesting call back to offer palliative care visit next week.

## 2019-02-10 ENCOUNTER — Telehealth: Payer: Self-pay

## 2019-02-10 ENCOUNTER — Ambulatory Visit
Admission: RE | Admit: 2019-02-10 | Discharge: 2019-02-10 | Disposition: A | Discharge status: Home / Self Care | Payer: Medicare Other | Source: Ambulatory Visit | Attending: Radiation Oncology | Admitting: Radiation Oncology

## 2019-02-10 ENCOUNTER — Other Ambulatory Visit: Payer: Self-pay

## 2019-02-10 ENCOUNTER — Other Ambulatory Visit: Payer: Self-pay | Admitting: *Deleted

## 2019-02-10 DIAGNOSIS — C7931 Secondary malignant neoplasm of brain: Secondary | ICD-10-CM | POA: Diagnosis not present

## 2019-02-10 DIAGNOSIS — Z510 Encounter for antineoplastic radiation therapy: Secondary | ICD-10-CM | POA: Diagnosis not present

## 2019-02-10 DIAGNOSIS — C61 Malignant neoplasm of prostate: Secondary | ICD-10-CM | POA: Diagnosis not present

## 2019-02-10 DIAGNOSIS — C7951 Secondary malignant neoplasm of bone: Secondary | ICD-10-CM | POA: Diagnosis not present

## 2019-02-10 MED ORDER — DEXAMETHASONE 4 MG PO TABS: 4.0000 mg | ORAL_TABLET | Freq: Two times a day (BID) | ORAL | 0 refills | Status: DC

## 2019-02-10 MED FILL — RUBRACA 300 MG TAB: 300 | 30 days supply | Qty: 120 | Fill #0

## 2019-02-10 NOTE — Telephone Encounter (Signed)
Telephone call from patient's [name].  RN called and left message on Friday requesting call back to offer visit.  William Wright reports home visit would be OK any afternoon this week other than Wednesday.  RN driving and informed Teressa Lower would call back to schedule visit time.  4:00 PM RN called patient's home but did not get answer.  RN left message for wife to call to schedule time for home visit this week.

## 2019-02-11 ENCOUNTER — Other Ambulatory Visit: Payer: Self-pay

## 2019-02-11 ENCOUNTER — Ambulatory Visit
Admission: RE | Admit: 2019-02-11 | Discharge: 2019-02-11 | Disposition: A | Discharge status: Home / Self Care | Payer: Medicare Other | Source: Ambulatory Visit | Attending: Radiation Oncology | Admitting: Radiation Oncology

## 2019-02-11 ENCOUNTER — Telehealth: Payer: Self-pay

## 2019-02-11 DIAGNOSIS — C7951 Secondary malignant neoplasm of bone: Secondary | ICD-10-CM | POA: Diagnosis not present

## 2019-02-11 DIAGNOSIS — C61 Malignant neoplasm of prostate: Secondary | ICD-10-CM | POA: Diagnosis not present

## 2019-02-11 DIAGNOSIS — Z510 Encounter for antineoplastic radiation therapy: Secondary | ICD-10-CM | POA: Diagnosis not present

## 2019-02-11 DIAGNOSIS — C7931 Secondary malignant neoplasm of brain: Secondary | ICD-10-CM | POA: Diagnosis not present

## 2019-02-11 NOTE — Telephone Encounter (Signed)
RN called patient's home but did not get a answer.  RN left message requesting call back to offer palliative care nursing visit this week.  Patient currently receiving radiation.

## 2019-02-11 NOTE — Telephone Encounter (Signed)
Telephone call from patient's wife [name].  [name] reports patient has been going to the cancer center daily for radiation treatments.  [name] states patient is exhausted and rests when he gets home after receiving treatments.  Sherry request palliative care visit next week.  [name] informed to call with questions or concerns.

## 2019-02-12 ENCOUNTER — Other Ambulatory Visit: Payer: Self-pay

## 2019-02-12 ENCOUNTER — Ambulatory Visit
Admission: RE | Admit: 2019-02-12 | Discharge: 2019-02-12 | Disposition: A | Discharge status: Home / Self Care | Payer: Medicare Other | Source: Ambulatory Visit | Attending: Radiation Oncology | Admitting: Radiation Oncology

## 2019-02-12 DIAGNOSIS — C7931 Secondary malignant neoplasm of brain: Secondary | ICD-10-CM | POA: Diagnosis not present

## 2019-02-12 DIAGNOSIS — C7951 Secondary malignant neoplasm of bone: Secondary | ICD-10-CM | POA: Diagnosis not present

## 2019-02-12 DIAGNOSIS — Z510 Encounter for antineoplastic radiation therapy: Secondary | ICD-10-CM | POA: Diagnosis not present

## 2019-02-12 DIAGNOSIS — C61 Malignant neoplasm of prostate: Secondary | ICD-10-CM | POA: Diagnosis not present

## 2019-02-13 ENCOUNTER — Other Ambulatory Visit: Payer: Self-pay

## 2019-02-13 ENCOUNTER — Inpatient Hospital Stay: Payer: Medicare Other

## 2019-02-13 ENCOUNTER — Ambulatory Visit
Admission: RE | Admit: 2019-02-13 | Discharge: 2019-02-13 | Disposition: A | Discharge status: Home / Self Care | Payer: Medicare Other | Source: Ambulatory Visit | Attending: Radiation Oncology | Admitting: Radiation Oncology

## 2019-02-13 DIAGNOSIS — Z79818 Long term (current) use of other agents affecting estrogen receptors and estrogen levels: Secondary | ICD-10-CM | POA: Diagnosis not present

## 2019-02-13 DIAGNOSIS — C7931 Secondary malignant neoplasm of brain: Secondary | ICD-10-CM | POA: Diagnosis not present

## 2019-02-13 DIAGNOSIS — R911 Solitary pulmonary nodule: Secondary | ICD-10-CM | POA: Diagnosis not present

## 2019-02-13 DIAGNOSIS — Z510 Encounter for antineoplastic radiation therapy: Secondary | ICD-10-CM | POA: Diagnosis not present

## 2019-02-13 DIAGNOSIS — R630 Anorexia: Secondary | ICD-10-CM | POA: Diagnosis not present

## 2019-02-13 DIAGNOSIS — C7951 Secondary malignant neoplasm of bone: Secondary | ICD-10-CM | POA: Diagnosis not present

## 2019-02-13 DIAGNOSIS — C61 Malignant neoplasm of prostate: Secondary | ICD-10-CM | POA: Diagnosis not present

## 2019-02-13 LAB — CBC WITH DIFFERENTIAL/PLATELET
Abs Immature Granulocytes: 0.03 10*3/uL (ref 0.00–0.07)
Basophils Absolute: 0 10*3/uL (ref 0.0–0.1)
Basophils Relative: 0 %
Eosinophils Absolute: 0.1 10*3/uL (ref 0.0–0.5)
Eosinophils Relative: 1 %
HCT: 39.4 % (ref 39.0–52.0)
Hemoglobin: 13.4 g/dL (ref 13.0–17.0)
Immature Granulocytes: 1 %
Lymphocytes Relative: 12 %
Lymphs Abs: 0.7 10*3/uL (ref 0.7–4.0)
MCH: 33 pg (ref 26.0–34.0)
MCHC: 34 g/dL (ref 30.0–36.0)
MCV: 97 fL (ref 80.0–100.0)
Monocytes Absolute: 0.7 10*3/uL (ref 0.1–1.0)
Monocytes Relative: 12 %
Neutro Abs: 4.2 10*3/uL (ref 1.7–7.7)
Neutrophils Relative %: 74 %
Platelets: 152 10*3/uL (ref 150–400)
RBC: 4.06 MIL/uL — ABNORMAL LOW (ref 4.22–5.81)
RDW: 13.1 % (ref 11.5–15.5)
WBC: 5.7 10*3/uL (ref 4.0–10.5)
nRBC: 0 % (ref 0.0–0.2)

## 2019-02-13 LAB — COMPREHENSIVE METABOLIC PANEL
ALT: 13 U/L (ref 0–44)
AST: 12 U/L — ABNORMAL LOW (ref 15–41)
Albumin: 3.7 g/dL (ref 3.5–5.0)
Alkaline Phosphatase: 55 U/L (ref 38–126)
Anion gap: 9 (ref 5–15)
BUN: 28 mg/dL — ABNORMAL HIGH (ref 8–23)
CO2: 24 mmol/L (ref 22–32)
Calcium: 9 mg/dL (ref 8.9–10.3)
Chloride: 106 mmol/L (ref 98–111)
Creatinine, Ser: 1 mg/dL (ref 0.61–1.24)
GFR calc Af Amer: >60 mL/min (ref >60)
GFR calc non Af Amer: >60 mL/min (ref >60)
Glucose, Bld: 111 mg/dL — ABNORMAL HIGH (ref 70–99)
Potassium: 4 mmol/L (ref 3.5–5.1)
Sodium: 139 mmol/L (ref 135–145)
Total Bilirubin: 0.7 mg/dL (ref 0.3–1.2)
Total Protein: 6.5 g/dL (ref 6.5–8.1)

## 2019-02-13 LAB — PSA: Prostatic Specific Antigen: 43.92 ng/mL — ABNORMAL HIGH (ref 0.00–4.00)

## 2019-02-14 ENCOUNTER — Ambulatory Visit
Admission: RE | Admit: 2019-02-14 | Discharge: 2019-02-14 | Disposition: A | Discharge status: Home / Self Care | Payer: Medicare Other | Source: Ambulatory Visit | Attending: Radiation Oncology | Admitting: Radiation Oncology

## 2019-02-14 ENCOUNTER — Other Ambulatory Visit: Payer: Self-pay

## 2019-02-14 ENCOUNTER — Telehealth: Payer: Self-pay

## 2019-02-14 DIAGNOSIS — C7931 Secondary malignant neoplasm of brain: Secondary | ICD-10-CM | POA: Diagnosis not present

## 2019-02-14 DIAGNOSIS — Z510 Encounter for antineoplastic radiation therapy: Secondary | ICD-10-CM | POA: Diagnosis not present

## 2019-02-14 DIAGNOSIS — C61 Malignant neoplasm of prostate: Secondary | ICD-10-CM | POA: Diagnosis not present

## 2019-02-14 DIAGNOSIS — C7951 Secondary malignant neoplasm of bone: Secondary | ICD-10-CM | POA: Diagnosis not present

## 2019-02-14 NOTE — Telephone Encounter (Signed)
Telephone call to patient's wife [name].  [name] reports patient will finish radiation treatments on Thursday.  Sherry in agreement with RN making home visit on 02-21-19 at 1:30 PM.  Wife informed to call with questions or concerns.

## 2019-02-17 ENCOUNTER — Inpatient Hospital Stay: Payer: Medicare Other | Attending: Internal Medicine | Admitting: Internal Medicine

## 2019-02-17 ENCOUNTER — Ambulatory Visit
Admission: RE | Admit: 2019-02-17 | Discharge: 2019-02-17 | Disposition: A | Discharge status: Home / Self Care | Payer: Medicare Other | Source: Ambulatory Visit | Attending: Radiation Oncology | Admitting: Radiation Oncology

## 2019-02-17 ENCOUNTER — Inpatient Hospital Stay: Payer: Medicare Other

## 2019-02-17 ENCOUNTER — Other Ambulatory Visit: Payer: Self-pay

## 2019-02-17 DIAGNOSIS — C61 Malignant neoplasm of prostate: Secondary | ICD-10-CM | POA: Insufficient documentation

## 2019-02-17 DIAGNOSIS — R5383 Other fatigue: Secondary | ICD-10-CM | POA: Insufficient documentation

## 2019-02-17 DIAGNOSIS — R630 Anorexia: Secondary | ICD-10-CM | POA: Insufficient documentation

## 2019-02-17 DIAGNOSIS — C7931 Secondary malignant neoplasm of brain: Secondary | ICD-10-CM | POA: Insufficient documentation

## 2019-02-17 DIAGNOSIS — Z515 Encounter for palliative care: Secondary | ICD-10-CM | POA: Diagnosis not present

## 2019-02-17 DIAGNOSIS — Z806 Family history of leukemia: Secondary | ICD-10-CM | POA: Insufficient documentation

## 2019-02-17 DIAGNOSIS — C7951 Secondary malignant neoplasm of bone: Secondary | ICD-10-CM | POA: Diagnosis not present

## 2019-02-17 DIAGNOSIS — Z510 Encounter for antineoplastic radiation therapy: Secondary | ICD-10-CM | POA: Insufficient documentation

## 2019-02-17 DIAGNOSIS — R5381 Other malaise: Secondary | ICD-10-CM | POA: Insufficient documentation

## 2019-02-17 DIAGNOSIS — Z79899 Other long term (current) drug therapy: Secondary | ICD-10-CM | POA: Insufficient documentation

## 2019-02-17 DIAGNOSIS — K5900 Constipation, unspecified: Secondary | ICD-10-CM | POA: Insufficient documentation

## 2019-02-17 DIAGNOSIS — R12 Heartburn: Secondary | ICD-10-CM | POA: Diagnosis not present

## 2019-02-17 MED ORDER — OXYBUTYNIN CHLORIDE ER 5 MG PO TB24: 5.0000 mg | ORAL_TABLET | Freq: Every day | ORAL | 3 refills | Status: AC

## 2019-02-17 NOTE — Assessment & Plan Note (Addendum)
#   Castrate resistant prostate cancer-stage IV; on Trelstar q 7M [09/27/2018]; May 28 PET- Progression; clinically progressing/worsening with new brain metastasis [see below]; PSA- 43; rising  # awaiting to start to [place] after finishing WBRT [08/06- finish]  # Brain metastasis/causing seizure- Stable;  Currently on Dex 4 mg BID/ keppra. Will stop in 1 week [total of 2 weeks. ]  # Bone mets/pain second malignancy; stable.  Continue Xgeva next visit.  #Goals of care: Discussed treatments are palliative; discussed regarding CODE STATUS.  Patient wants to be full code.  We will have him see Josh at next visit.  # DISPOSITION: # follow up in 2 weeks/labs/ X-geva-cbc/cmp # Josh in 2 weeks- Dr.B

## 2019-02-17 NOTE — Progress Notes (Signed)
Patient has started XRT with last tx this Thursday.  He has received Rubraca and is waiting until has finished XRT to start.  Refill for Oxybutynin submitted for MD sign today.

## 2019-02-17 NOTE — Patient Instructions (Signed)
#  Start taking RUBRACA the day after finishing radiation

## 2019-02-17 NOTE — Progress Notes (Signed)
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   # MARCH 2019- Taken off trial [ based on interm analysis]; continue X-tandi [given not needing steroids]  # July 2019- CT/bone scan stable; MRI lumbar spine- epidural extension/L3 spinal nerves involvement; on RT [finish sep 12th];  #May 28 PET scan-progressive disease; patient continues Xtandi/reluctant with other options.  #July 10th-  2020-brain metastases; [facility] neurosurgery evaluation; stop Xtandi.  S July 17 seizure; July 20-th whole brain radiation  # [facility]bro[facility] [[facility]] on sep 13th 2019.  # RLL [~47m lung nodule] s/p Bx- Cryptococcus- ID/Dr.Fitzgerald- on diflucan  # Xgeva  # MOLECULAR TESTING/omniseq- BRCA-2 MUTATED; PDL-1- NEG; MSS; N-TRK-NEG  # GENETICS- BRCA-NEG; hetrozygous MUTHY; recommended family work up --------------------------------------------------    DIAGNOSIS: _0  Metastatic prostate cancer  STAGE: 4    ;GOALS: Palliative  CURRENT/MOST RECENT THERAPY-X-tandi+ Trelstar    [place]  08/17/2012 Initial Diagnosis   Cancer of prostate, stage II   12/03/2013 Progression     10/13/2014 Progression     Prostate cancer metastatic to multiple sites ([facility]      INTERVAL HISTORY:  William [id number].o.  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  Patient is currently finishing up whole brain radiation on August 6.  Patient is currently on dexamethasone 4 mg twice a day 1 more week to go.  He denies any headaches.  Denies any seizures.  Patient finally agreed to start the systemic therapy with Rubraca.  This has been approved.  Patient has medication available at home.  Not started.  No nausea no vomiting.  Appetite is fair.  His back pain is improved since radiation.   Review of Systems  Constitutional: Positive for malaise/fatigue. Negative for chills, diaphoresis and fever.  HENT: Negative for nosebleeds and sore throat.   Eyes: Negative for double vision.  Respiratory: Negative for cough, hemoptysis, sputum production, shortness of breath and wheezing.   Cardiovascular: Negative for chest pain, palpitations, orthopnea and leg swelling.  Gastrointestinal: Negative for abdominal pain, blood in stool, constipation, diarrhea, heartburn, melena, nausea and vomiting.  Genitourinary: Negative for dysuria, frequency and urgency.  Musculoskeletal: Positive for back pain and joint pain.  Skin: Negative.  Negative for itching and rash.  Neurological: Negative for dizziness, focal weakness, weakness and headaches.  Endo/Heme/Allergies: Does not bruise/bleed easily.  Psychiatric/Behavioral: Negative for depression. The patient does not have insomnia.       PAST MEDICAL HISTORY :  Past Medical History:  Diagnosis Date  . Cancer of prostate ([place] 07/2012   OraL chemo pill.   . Family history of leukemia     PAST SURGICAL HISTORY :   Past Surgical History:  Procedure Laterality Date  . EXTRACORPOREAL SHOCK WAVE LITHOTRIPSY Left 05/20/2015   Procedure:  EXTRACORPOREAL SHOCK WAVE LITHOTRIPSY (ESWL);  Surgeon: Royston Cowper, MD;  Location: [facility] ORS;  Service:  Urology;  Laterality: Left;  . EXTRACORPOREAL SHOCK WAVE LITHOTRIPSY Right 12/21/2016   Procedure: EXTRACORPOREAL SHOCK WAVE LITHOTRIPSY (ESWL);  Surgeon: Royston Cowper, MD;  Location: [facility] ORS;  Service: Urology;  Laterality: Right;  . IR RADIOLOGIST EVAL & MGMT  02/28/2018    FAMILY HISTORY :   Family History  Problem Relation Age of Onset  . Alzheimer's disease Father   . Lung cancer Maternal Grandfather 59  . Leukemia Paternal Grandmother 49  . COPD Paternal Grandfather   . Cancer Maternal Uncle        type unk, dx 80's    SOCIAL HISTORY:   Social History   Tobacco Use  . Smoking status: Never Smoker  . Smokeless tobacco: Never Used  Substance Use Topics  . Alcohol use: No  . Drug use: No    ALLERGIES:  is allergic to no known allergies.  MEDICATIONS:  Current Outpatient Medications  Medication Sig Dispense Refill  . dexamethasone (DECADRON) 4 MG tablet Take 1 tablet (4 mg total) by mouth 2 (two) times daily with a meal. 30 tablet 0  . gabapentin (NEURONTIN) 100 MG capsule Take 1 capsule (100 mg total) by mouth 3 (three) times daily. 90 capsule 3  . levETIRAcetam (KEPPRA) 500 MG tablet Take 1 tablet (500 mg total) by mouth 2 (two) times daily. 60 tablet 3  . oxybutynin (DITROPAN-XL) 5 MG 24 hr tablet Take 1 tablet (5 mg total) by mouth at bedtime. 90 tablet 3  . tamsulosin (FLOMAX) 0.4 MG CAPS capsule TAKE 1 CAPSULE BY MOUTH DAILY (Patient taking differently: Take 0.4 mg by mouth at bedtime. ) 90 capsule 3  . Triptorelin Pamoate (TRELSTAR) 22.5 MG injection Inject 22.5 mg into the muscle every 6 (six) months.    . valACYclovir (VALTREX) 1000 MG tablet Take 1 tablet (1,000 mg total) by mouth 2 (two) times daily. (Patient taking differently: Take 1,000 mg by mouth 2 (two) times daily as needed (fever blister). ) 60 tablet 6  . rucaparib camsylate (RUBRACA) 300 MG tablet Take 2 tablets (600 mg total) by mouth 2 (two) times daily. (Patient not taking: Reported on 02/17/2019) 120  tablet 3   No current facility-administered medications for this visit.     PHYSICAL EXAMINATION: ECOG PERFORMANCE STATUS: 1 - Symptomatic but completely ambulatory  BP 117/71   Pulse (!) 49   Temp (!) 96.7 F (35.9 C)   Resp 16   Wt 159 lb 6.4 oz (72.3 kg)   BMI 21.62 kg/m   Filed Weights   02/17/19 1013  Weight: 159 lb 6.4 oz (72.3 kg)    Physical Exam  Constitutional: He is oriented to person, place, and time and well-developed, well-nourished, and in no distress.  Alone.  Walking by himself.  HENT:  Head: Normocephalic and atraumatic.  Mouth/Throat: Oropharynx is clear and moist. No oropharyngeal exudate.  Eyes: Pupils are equal, round, and reactive to light.  Neck: Normal range of motion. Neck supple.  Cardiovascular: Normal rate and regular rhythm.  Pulmonary/Chest: No respiratory distress. He has no wheezes.  Abdominal: Soft. Bowel sounds are normal. He exhibits no distension and no mass. There is no abdominal tenderness. There is no rebound and no guarding.  Musculoskeletal: Normal range of motion.        General: No tenderness or edema.  Neurological: He is alert and oriented to person, place, and time.  Skin:  Skin is warm.  Psychiatric: Affect normal.       LABORATORY DATA:  I have reviewed the data as listed    Component Value Date/Time   NA 139 02/13/2019 1122   NA 137 11/12/2014 1415   K 4.0 02/13/2019 1122   K 4.1 11/12/2014 1415   CL 106 02/13/2019 1122   CL 105 11/12/2014 1415   CO2 24 02/13/2019 1122   CO2 24 11/12/2014 1415   GLUCOSE 111 (H) 02/13/2019 1122   GLUCOSE 98 11/12/2014 1415   BUN 28 (H) 02/13/2019 1122   BUN 24 (H) 11/12/2014 1415   CREATININE 1.00 02/13/2019 1122   CREATININE 1.02 11/12/2014 1415   CALCIUM 9.0 02/13/2019 1122   CALCIUM 9.2 11/12/2014 1415   PROT 6.5 02/13/2019 1122   PROT 7.4 11/12/2014 1415   ALBUMIN 3.7 02/13/2019 1122   ALBUMIN 4.4 11/12/2014 1415   AST 12 (L) 02/13/2019 1122   AST 23 11/12/2014  1415   ALT 13 02/13/2019 1122   ALT 16 (L) 11/12/2014 1415   ALKPHOS 55 02/13/2019 1122   ALKPHOS 103 11/12/2014 1415   BILITOT 0.7 02/13/2019 1122   BILITOT 0.4 11/12/2014 1415   GFRNONAA >60 02/13/2019 1122   GFRNONAA >60 11/12/2014 1415   GFRAA >60 02/13/2019 1122   GFRAA >60 11/12/2014 1415    No results found for: SPEP, UPEP  Lab Results  Component Value Date   WBC 5.7 02/13/2019   NEUTROABS 4.2 02/13/2019   HGB 13.4 02/13/2019   HCT 39.4 02/13/2019   MCV 97.0 02/13/2019   PLT 152 02/13/2019      Chemistry      Component Value Date/Time   NA 139 02/13/2019 1122   NA 137 11/12/2014 1415   K 4.0 02/13/2019 1122   K 4.1 11/12/2014 1415   CL 106 02/13/2019 1122   CL 105 11/12/2014 1415   CO2 24 02/13/2019 1122   CO2 24 11/12/2014 1415   BUN 28 (H) 02/13/2019 1122   BUN 24 (H) 11/12/2014 1415   CREATININE 1.00 02/13/2019 1122   CREATININE 1.02 11/12/2014 1415      Component Value Date/Time   CALCIUM 9.0 02/13/2019 1122   CALCIUM 9.2 11/12/2014 1415   ALKPHOS 55 02/13/2019 1122   ALKPHOS 103 11/12/2014 1415   AST 12 (L) 02/13/2019 1122   AST 23 11/12/2014 1415   ALT 13 02/13/2019 1122   ALT 16 (L) 11/12/2014 1415   BILITOT 0.7 02/13/2019 1122   BILITOT 0.4 11/12/2014 1415       RADIOGRAPHIC STUDIES: I have personally reviewed the radiological images as listed and agreed with the findings in the report. No results found.   ASSESSMENT & PLAN:  Prostate cancer metastatic to multiple sites [facility]) # Castrate resistant prostate cancer-stage IV; on Trelstar q 40M [09/27/2018]; May 28 PET- Progression; clinically progressing/worsening with new brain metastasis [see below]; PSA- 43; rising  # awaiting to start to [place] after finishing WBRT [08/06- finish]  # Brain metastasis/causing seizure- Stable;  Currently on Dex 4 mg BID/ keppra. Will stop in 1 week [total of 2 weeks. ]  # Bone mets/pain second malignancy; stable.  Continue Xgeva next visit.  #Goals of  care: Discussed treatments are palliative; discussed regarding CODE STATUS.  Patient wants to be full code.  We will have him see Josh at next visit.  # DISPOSITION: # follow up in 2 weeks/labs/ X-geva-cbc/cmp # Josh in 2 weeks- Dr.B       Orders Placed  This Encounter  Procedures  . CBC with Differential    Standing Status:   Future    Standing Expiration Date:   02/17/2020  . Comprehensive metabolic panel    Standing Status:   Future    Standing Expiration Date:   02/17/2020   All questions were answered. The patient knows to call the clinic with any problems, questions or concerns.      Cammie Sickle, MD 02/17/2019 1:30 PM

## 2019-02-18 ENCOUNTER — Ambulatory Visit
Admission: RE | Admit: 2019-02-18 | Discharge: 2019-02-18 | Disposition: A | Discharge status: Home / Self Care | Payer: Medicare Other | Source: Ambulatory Visit | Attending: Radiation Oncology | Admitting: Radiation Oncology

## 2019-02-18 ENCOUNTER — Other Ambulatory Visit: Payer: Self-pay

## 2019-02-18 DIAGNOSIS — Z510 Encounter for antineoplastic radiation therapy: Secondary | ICD-10-CM | POA: Diagnosis not present

## 2019-02-18 DIAGNOSIS — C7931 Secondary malignant neoplasm of brain: Secondary | ICD-10-CM | POA: Diagnosis not present

## 2019-02-18 DIAGNOSIS — C7951 Secondary malignant neoplasm of bone: Secondary | ICD-10-CM | POA: Diagnosis not present

## 2019-02-18 DIAGNOSIS — C61 Malignant neoplasm of prostate: Secondary | ICD-10-CM | POA: Diagnosis not present

## 2019-02-19 ENCOUNTER — Ambulatory Visit: Payer: Medicare Other

## 2019-02-19 ENCOUNTER — Other Ambulatory Visit: Payer: Self-pay

## 2019-02-19 DIAGNOSIS — C7951 Secondary malignant neoplasm of bone: Secondary | ICD-10-CM | POA: Diagnosis not present

## 2019-02-19 DIAGNOSIS — C61 Malignant neoplasm of prostate: Secondary | ICD-10-CM | POA: Diagnosis not present

## 2019-02-19 DIAGNOSIS — C7931 Secondary malignant neoplasm of brain: Secondary | ICD-10-CM | POA: Diagnosis not present

## 2019-02-19 DIAGNOSIS — Z510 Encounter for antineoplastic radiation therapy: Secondary | ICD-10-CM | POA: Diagnosis not present

## 2019-02-20 ENCOUNTER — Ambulatory Visit: Payer: Medicare Other

## 2019-02-21 ENCOUNTER — Other Ambulatory Visit: Payer: Medicare Other

## 2019-02-21 ENCOUNTER — Other Ambulatory Visit: Payer: Self-pay

## 2019-02-21 DIAGNOSIS — Z515 Encounter for palliative care: Secondary | ICD-10-CM

## 2019-02-21 NOTE — Progress Notes (Signed)
PATIENT NAME: FLORENCE ANTONELLI DOB: 1950/08/18 MRN: [id number]  PRIMARY CARE PROVIDER: Cammie Sickle, MD  RESPONSIBLE PARTY:  Acct ID - Guarantor Home Phone Work Phone Relationship Acct Type  [id number] [name]  Self P/F     [address], [place], [place] [address]    PLAN OF CARE and INTERVENTIONS:               1.  GOALS OF CARE/ ADVANCE CARE PLANNING:  Remain at home with family.               2.  PATIENT/CAREGIVER EDUCATION:  Education on fall precautions, education on s/s of infection.               3.  DISEASE STATUS: RN made home visit with patient and wife.  Patient and wife planing on going to the beach this weekend.  Patient finished radiation treatments on Wednesday.  Patient denies having any pain at the present time.  Patient reports he has not had any pain since receiving radiation.  Wife states she feels patient may not be feeling pain due to patient having brain mets.  Patient has not suffered any seizures and has been taking is Keppra as directed.  Patient to see Dr [name] on September 9th and will see Dr [name] 03-03-19.  Patient is using a cane as patient is unsteady on his feet.  Patient has not suffered any falls.  Patient reports his appetite is good. Education to patient Dexamethasone he is taking increases appetite.  Patient denies having any cough or shortness of breath.  Patient has not been sleeping well at night due to having dreams and having to urinate.  Education to wife to try giving patient Melatonin or 1 tablet of Benadryl at night to aide with sleep. RN reviewed patient's meds with wife.  Patient and wife remain in agreement with palliative care services.  Patient continues to have left sided weakness.         HISTORY OF PRESENT ILLNESS:    CODE STATUS: Full Code  ADVANCED DIRECTIVES: Y MOST FORM: No PPS: 50%   PHYSICAL EXAM:   VITALS: See vital signs  LUNGS: Breathsounds clear/ denies cough and shortness of breath CARDIAC:  Cor RRR  EXTREMITIES: Trace edema SKIN: Skin color, texture, turgor normal. No rashes or lesions  NEURO: positive for coordination problems and gait problems       [name], RN

## 2019-02-28 ENCOUNTER — Other Ambulatory Visit: Payer: Self-pay

## 2019-02-28 DIAGNOSIS — C7931 Secondary malignant neoplasm of brain: Secondary | ICD-10-CM | POA: Diagnosis not present

## 2019-02-28 DIAGNOSIS — R569 Unspecified convulsions: Secondary | ICD-10-CM | POA: Diagnosis not present

## 2019-03-03 ENCOUNTER — Other Ambulatory Visit: Payer: Self-pay | Admitting: *Deleted

## 2019-03-03 ENCOUNTER — Encounter: Payer: Self-pay | Admitting: Internal Medicine

## 2019-03-03 ENCOUNTER — Inpatient Hospital Stay: Payer: Medicare Other

## 2019-03-03 ENCOUNTER — Other Ambulatory Visit: Payer: Self-pay

## 2019-03-03 ENCOUNTER — Other Ambulatory Visit: Payer: Self-pay | Admitting: Internal Medicine

## 2019-03-03 ENCOUNTER — Inpatient Hospital Stay (HOSPITAL_BASED_OUTPATIENT_CLINIC_OR_DEPARTMENT_OTHER): Payer: Medicare Other | Admitting: Hospice and Palliative Medicine

## 2019-03-03 ENCOUNTER — Inpatient Hospital Stay (HOSPITAL_BASED_OUTPATIENT_CLINIC_OR_DEPARTMENT_OTHER): Payer: Medicare Other | Admitting: Internal Medicine

## 2019-03-03 DIAGNOSIS — Z7189 Other specified counseling: Secondary | ICD-10-CM | POA: Diagnosis not present

## 2019-03-03 DIAGNOSIS — C61 Malignant neoplasm of prostate: Secondary | ICD-10-CM

## 2019-03-03 DIAGNOSIS — R5383 Other fatigue: Secondary | ICD-10-CM | POA: Diagnosis not present

## 2019-03-03 DIAGNOSIS — Z515 Encounter for palliative care: Secondary | ICD-10-CM

## 2019-03-03 DIAGNOSIS — R5381 Other malaise: Secondary | ICD-10-CM | POA: Diagnosis not present

## 2019-03-03 DIAGNOSIS — R531 Weakness: Secondary | ICD-10-CM | POA: Diagnosis not present

## 2019-03-03 DIAGNOSIS — C7951 Secondary malignant neoplasm of bone: Secondary | ICD-10-CM | POA: Diagnosis not present

## 2019-03-03 DIAGNOSIS — C7931 Secondary malignant neoplasm of brain: Secondary | ICD-10-CM

## 2019-03-03 LAB — CBC WITH DIFFERENTIAL/PLATELET
Abs Immature Granulocytes: 0.02 10*3/uL (ref 0.00–0.07)
Basophils Absolute: 0 10*3/uL (ref 0.0–0.1)
Basophils Relative: 0 %
Eosinophils Absolute: 0.2 10*3/uL (ref 0.0–0.5)
Eosinophils Relative: 4 %
HCT: 37.3 % — ABNORMAL LOW (ref 39.0–52.0)
Hemoglobin: 12.5 g/dL — ABNORMAL LOW (ref 13.0–17.0)
Immature Granulocytes: 1 %
Lymphocytes Relative: 13 %
Lymphs Abs: 0.5 10*3/uL — ABNORMAL LOW (ref 0.7–4.0)
MCH: 33.2 pg (ref 26.0–34.0)
MCHC: 33.5 g/dL (ref 30.0–36.0)
MCV: 98.9 fL (ref 80.0–100.0)
Monocytes Absolute: 0.5 10*3/uL (ref 0.1–1.0)
Monocytes Relative: 12 %
Neutro Abs: 2.9 10*3/uL (ref 1.7–7.7)
Neutrophils Relative %: 70 %
Platelets: 135 10*3/uL — ABNORMAL LOW (ref 150–400)
RBC: 3.77 MIL/uL — ABNORMAL LOW (ref 4.22–5.81)
RDW: 12.8 % (ref 11.5–15.5)
WBC: 4 10*3/uL (ref 4.0–10.5)
nRBC: 0 % (ref 0.0–0.2)

## 2019-03-03 LAB — COMPREHENSIVE METABOLIC PANEL
ALT: 17 U/L (ref 0–44)
AST: 16 U/L (ref 15–41)
Albumin: 3.6 g/dL (ref 3.5–5.0)
Alkaline Phosphatase: 66 U/L (ref 38–126)
Anion gap: 9 (ref 5–15)
BUN: 28 mg/dL — ABNORMAL HIGH (ref 8–23)
CO2: 25 mmol/L (ref 22–32)
Calcium: 9 mg/dL (ref 8.9–10.3)
Chloride: 106 mmol/L (ref 98–111)
Creatinine, Ser: 0.9 mg/dL (ref 0.61–1.24)
GFR calc Af Amer: >60 mL/min (ref >60)
GFR calc non Af Amer: >60 mL/min (ref >60)
Glucose, Bld: 102 mg/dL — ABNORMAL HIGH (ref 70–99)
Potassium: 3.9 mmol/L (ref 3.5–5.1)
Sodium: 140 mmol/L (ref 135–145)
Total Bilirubin: 0.6 mg/dL (ref 0.3–1.2)
Total Protein: 6.5 g/dL (ref 6.5–8.1)

## 2019-03-03 MED ORDER — ONDANSETRON HCL 8 MG PO TABS: ORAL_TABLET | ORAL | 3 refills | Status: AC

## 2019-03-03 MED ORDER — DEXAMETHASONE 4 MG PO TABS: 4.0000 mg | ORAL_TABLET | Freq: Two times a day (BID) | ORAL | 0 refills | Status: AC

## 2019-03-03 MED ORDER — DENOSUMAB 120 MG/1.7ML ~~LOC~~ SOLN
120.0000 mg | Freq: Once | SUBCUTANEOUS | Status: AC
  Administered 2019-03-03: 120 mg via SUBCUTANEOUS
  Filled 2019-03-03: qty 1.7

## 2019-03-03 NOTE — Progress Notes (Signed)
[place]  Telephone:([phone] [phone]   Name: William Wright Date: 03/03/2019 MRN: [id number]  DOB: 27-May-1951  Patient Care Team: Cammie Sickle, MD as PCP - General (Internal Medicine) Royston Cowper, MD (Urology)    REASON FOR CONSULTATION: Palliative Care consult requested for this 68 y.o. 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  Patient has had disease progression on previous lines of treatment.  He is being rotated to [place] with palliative intent.  Patient was referred to palliative care to help address goals and manage ongoing symptoms.   SOCIAL HISTORY:     reports that he has never smoked. He has never used smokeless tobacco. He reports that he does not drink alcohol or use drugs.   Patient is married and lives at home with his wife of 40+ years.  His son also lives in the house.  Patient has a daughter who lives nearby.  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  He has a Oceanographer in counseling from [employer].  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  ADVANCE DIRECTIVES:  Wife is his financial and healthcare power of attorney  CODE STATUS:   PAST MEDICAL HISTORY: Past Medical History:  Diagnosis Date   Cancer of prostate ([place]) 07/2012   OraL chemo pill.    Family history of leukemia     PAST SURGICAL HISTORY:  Past Surgical History:  Procedure Laterality Date   EXTRACORPOREAL SHOCK WAVE LITHOTRIPSY Left 05/20/2015   Procedure: EXTRACORPOREAL SHOCK WAVE LITHOTRIPSY (ESWL);  Surgeon: Royston Cowper, MD;  Location: [facility] ORS;  Service: Urology;  Laterality: Left;   EXTRACORPOREAL SHOCK WAVE LITHOTRIPSY  Right 12/21/2016   Procedure: EXTRACORPOREAL SHOCK WAVE LITHOTRIPSY (ESWL);  Surgeon: Royston Cowper, MD;  Location: [facility] ORS;  Service: Urology;  Laterality: Right;   IR RADIOLOGIST EVAL & MGMT  02/28/2018    HEMATOLOGY/ONCOLOGY HISTORY:  Oncology History Overview Note  # FEB 2014- Prostate cancer Stage II [T2N0]; [place]-18; [Dr.Wolfe]  # May 2015-STAGE IV;  PSA 90 on ADT; CT/Bone scan-extensive mets;  casodex+ Trelstar  # June 2016- Castration resistant prostate cancer/[facility]- ZYTIGA plus minus XTANDI; June 28th-CT-C/A/P- Stable Retrocrural/RP/Pelvic LN;Bone scan- Stable sclerotic lesions.   # MARCH 2019- Taken off trial [ based on interm analysis]; continue X-tandi [given not needing steroids]  # July 2019- CT/bone scan stable; MRI lumbar spine- epidural extension/L3 spinal nerves involvement; on RT [finish sep 12th];  #May 28 PET scan-progressive disease; patient continues Xtandi/reluctant with other options.  #July 10th-  2020-brain metastases; [facility] neurosurgery evaluation; stop Xtandi.  S July 17 seizure; July 20-th whole brain radiation  # [facility] on [facility] 13th 2019.  # RLL [~71m lung nodule] s/p Bx- Cryptococcus- ID/Dr.Fitzgerald- on diflucan  # Xgeva  # MOLECULAR TESTING/omniseq- BRCA-2 MUTATED; PDL-1- NEG; MSS; N-TRK-NEG  # GENETICS- BRCA-NEG; hetrozygous MUTHY; recommended family work up --------------------------------------------------    DIAGNOSIS: _0  Metastatic prostate cancer  STAGE: 4    ;GOALS: Palliative  CURRENT/MOST RECENT THERAPY-X-tandi+ Trelstar    [place]cer of prostate ([place]  08/17/2012 Initial Diagnosis   Cancer of prostate, stage II   12/03/2013 Progression     10/13/2014 Progression  Prostate cancer metastatic to multiple sites [facility])    ALLERGIES:  is allergic to no known allergies.  MEDICATIONS:  Current Outpatient Medications  Medication Sig Dispense Refill   dexamethasone (DECADRON) 4 MG tablet Take 1 tablet (4 mg  total) by mouth 2 (two) times daily with a meal. 30 tablet 0   gabapentin (NEURONTIN) 100 MG capsule Take 1 capsule (100 mg total) by mouth 3 (three) times daily. 90 capsule 3   levETIRAcetam (KEPPRA) 500 MG tablet Take 1 tablet (500 mg total) by mouth 2 (two) times daily. 60 tablet 3   oxybutynin (DITROPAN-XL) 5 MG 24 hr tablet Take 1 tablet (5 mg total) by mouth at bedtime. 90 tablet 3   rucaparib camsylate (RUBRACA) 300 MG tablet Take 2 tablets (600 mg total) by mouth 2 (two) times daily. (Patient not taking: Reported on 02/17/2019) 120 tablet 3   tamsulosin (FLOMAX) 0.4 MG CAPS capsule TAKE 1 CAPSULE BY MOUTH DAILY (Patient taking differently: Take 0.4 mg by mouth at bedtime. ) 90 capsule 3   Triptorelin Pamoate (TRELSTAR) 22.5 MG injection Inject 22.5 mg into the muscle every 6 (six) months.     valACYclovir (VALTREX) 1000 MG tablet Take 1 tablet (1,000 mg total) by mouth 2 (two) times daily. (Patient taking differently: Take 1,000 mg by mouth 2 (two) times daily as needed (fever blister). ) 60 tablet 6   No current facility-administered medications for this visit.     VITAL SIGNS: There were no vitals taken for this visit. There were no vitals filed for this visit.  Estimated body mass index is 22.11 kg/m as calculated from the following:   Height as of 01/31/19: 6' (1.829 m).   Weight as of an earlier encounter on 03/03/19: 163 lb (73.9 kg).  LABS: CBC:    Component Value Date/Time   WBC 4.0 03/03/2019 0903   HGB 12.5 (L) 03/03/2019 0903   HGB 13.0 11/12/2014 1415   HCT 37.3 (L) 03/03/2019 0903   HCT 37.7 (L) 11/12/2014 1415   PLT 135 (L) 03/03/2019 0903   PLT 224 11/12/2014 1415   MCV 98.9 03/03/2019 0903   MCV 91 11/12/2014 1415   NEUTROABS 2.9 03/03/2019 0903   NEUTROABS 3.5 11/12/2014 1415   LYMPHSABS 0.5 (L) 03/03/2019 0903   LYMPHSABS 1.3 11/12/2014 1415   MONOABS 0.5 03/03/2019 0903   MONOABS 0.5 11/12/2014 1415   EOSABS 0.2 03/03/2019 0903   EOSABS 0.3  11/12/2014 1415   BASOSABS 0.0 03/03/2019 0903   BASOSABS 0.1 11/12/2014 1415   Comprehensive Metabolic Panel:    Component Value Date/Time   NA 139 02/13/2019 1122   NA 137 11/12/2014 1415   K 4.0 02/13/2019 1122   K 4.1 11/12/2014 1415   CL 106 02/13/2019 1122   CL 105 11/12/2014 1415   CO2 24 02/13/2019 1122   CO2 24 11/12/2014 1415   BUN 28 (H) 02/13/2019 1122   BUN 24 (H) 11/12/2014 1415   CREATININE 1.00 02/13/2019 1122   CREATININE 1.02 11/12/2014 1415   GLUCOSE 111 (H) 02/13/2019 1122   GLUCOSE 98 11/12/2014 1415   CALCIUM 9.0 02/13/2019 1122   CALCIUM 9.2 11/12/2014 1415   AST 12 (L) 02/13/2019 1122   AST 23 11/12/2014 1415   ALT 13 02/13/2019 1122   ALT 16 (L) 11/12/2014 1415   ALKPHOS 55 02/13/2019 1122   ALKPHOS 103 11/12/2014 1415   BILITOT 0.7 02/13/2019 1122   BILITOT 0.4 11/12/2014 1415   PROT 6.5 02/13/2019 1122  PROT 7.4 11/12/2014 1415   ALBUMIN 3.7 02/13/2019 1122   ALBUMIN 4.4 11/12/2014 1415    RADIOGRAPHIC STUDIES: No results found.  PERFORMANCE STATUS (ECOG) : 3 - Symptomatic, >50% confined to bed  Review of Systems Unless otherwise noted, a complete review of systems is negative.  Physical Exam General: NAD, frail appearing, thin, in wheelchair Pulmonary: Unlabored Extremities: no edema, no joint deformities Skin: no rashes Neurological: Weakness but otherwise nonfocal  IMPRESSION: I met with patient today to discuss goals.  Introduced palliative care services and attempted establish therapeutic rapport.  Patient has agreed to start oral chemotherapy but has historically been reluctant to accept systemic chemotherapy.  He says that his primary goal is to maintain his quality of life for as long as possible.  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  Symptomatically, patient does have occasional pain.  He has had progressive weakness over the last  several weeks likely due to his brain metastases.  Patient says he is ambulating with use of a cane and assistance from his wife.  His wife also helps with activities of daily living such as bathing and dressing.  He seems to be a high fall risk.  Patient would benefit from home health OT/PT to evaluate weakness.  He would also benefit from a walker.  We will also refer him to our clinic OT.  Patient does not have a living will but has named his wife to be his financial/healthcare power of attorney.  Today we reviewed a MOST Form, which patient took home to discuss with his wife.  Patient has previously stated that he would want an attempt at resuscitation.  However, patient seemed unsure that he would want aggressive measures when we explored the nature of resuscitation in more detail and how it would be unlikely that patient would return to a quality of life that he found acceptable.  I tried calling his wife today during the visit but was unable to reach her.  Case and plan discussed with Dr. [name]  PLAN: -Continue current scope of treatment -MOST Form reviewed -Home health OT/PT -Referral to clinic OT -DME: Walker (patient wants to try an old one that he has at home and if that does not work we will write an order) -RTC in 2 to 3 weeks   Patient expressed understanding and was in agreement with this plan. He also understands that He can call the clinic at any time with any questions, concerns, or complaints.     Time Total: 30 minutes  Visit consisted of counseling and education dealing with the complex and emotionally intense issues of symptom management and palliative care in the setting of serious and potentially life-threatening illness.Greater than 50%  of this time was spent counseling and coordinating care related to the above assessment and plan.  Signed by: Altha Harm, PhD, NP-C [phone] (Work Cell)

## 2019-03-03 NOTE — Telephone Encounter (Signed)
Wife called and said that Dr B said he was to continue this medicine this morning when he was in office. Pharmacy needs prescription sent

## 2019-03-03 NOTE — Progress Notes (Signed)
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   # MARCH 2019- Taken off trial [ based on interm analysis]; continue X-tandi [given not needing steroids]  # July 2019- CT/bone scan stable; MRI lumbar spine- epidural extension/L3 spinal nerves involvement; on RT [finish sep 12th];  #May 28 PET scan-progressive disease; patient continues Xtandi/reluctant with other options.  #July 10th-  2020-brain metastases; [facility] neurosurgery evaluation; stop Xtandi.  S July 17 seizure; July 20-th whole brain radiation  # vertebroplasty [[facility]] on sep 13th 2019.  # RLL [~52m lung nodule] s/p Bx- Cryptococcus- ID/Dr.Fitzgerald- on diflucan  # Xgeva  # MOLECULAR TESTING/omniseq- BRCA-2 MUTATED; PDL-1- NEG; MSS; N-TRK-NEG  # GENETICS- BRCA-NEG; hetrozygous MUTHY; recommended family work up --------------------------------------------------    DIAGNOSIS: '[ ]'  Metastatic prostate cancer  STAGE: 4    ;GOALS: Palliative  CURRENT/MOST RECENT THERAPY-X-tandi+ Trelstar    Cancer of prostate ([facility]  08/17/2012 Initial Diagnosis   Cancer of prostate, stage II   12/03/2013 Progression     10/13/2014 Progression     Prostate cancer metastatic to multiple sites ([facility]      INTERVAL HISTORY:  William KERRIGAN89y.[id number].  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  Patient currently finished whole brain radiation on August 6th.  He has not started rubraca yet.  Patient is currently taking dexamethasone 1 mg once a day./Tapering.  However as per the wife-patient noted to have worsening weakness of his left upper and lower extremities.  He seems to be dragging.  No falls.  He states his pain is stable.  As per the wife patient has having problems with constipation.  He has been using Dulcolax without any major relief.  Poor appetite.  Also complains of difficulty with heartburn  Review of Systems  Constitutional: Positive for malaise/fatigue. Negative for chills, diaphoresis and fever.  HENT: Negative for nosebleeds and sore throat.   Eyes: Negative for double vision.  Respiratory: Negative for cough, hemoptysis, sputum production, shortness of breath and wheezing.   Cardiovascular: Negative for chest pain, palpitations, orthopnea and leg swelling.  Gastrointestinal: Negative for abdominal pain, blood in stool, constipation, diarrhea, heartburn, melena, nausea and vomiting.  Genitourinary: Negative for dysuria, frequency and urgency.  Musculoskeletal: Positive for back pain and joint pain.  Skin: Negative.  Negative for itching and rash.  Neurological: Negative for dizziness, focal weakness, weakness and headaches.  Endo/Heme/Allergies: Does not bruise/bleed easily.  Psychiatric/Behavioral: Negative for depression. The patient does not have insomnia.       PAST MEDICAL HISTORY :  Past Medical History:  Diagnosis Date  . [place]   OraL chemo pill.   . Family history of leukemia     PAST SURGICAL HISTORY :   Past Surgical History:  Procedure Laterality  Date  . EXTRACORPOREAL SHOCK WAVE LITHOTRIPSY Left 05/20/2015   Procedure: EXTRACORPOREAL SHOCK  WAVE LITHOTRIPSY (ESWL);  Surgeon: Royston Cowper, MD;  Location: [facility] ORS;  Service: Urology;  Laterality: Left;  . EXTRACORPOREAL SHOCK WAVE LITHOTRIPSY Right 12/21/2016   Procedure: EXTRACORPOREAL SHOCK WAVE LITHOTRIPSY (ESWL);  Surgeon: Royston Cowper, MD;  Location: [facility] ORS;  Service: Urology;  Laterality: Right;  . IR RADIOLOGIST EVAL & MGMT  02/28/2018    FAMILY HISTORY :   Family History  Problem Relation Age of Onset  . Alzheimer's disease Father   . Lung cancer Maternal Grandfather 19  . Leukemia Paternal Grandmother 23  . COPD Paternal Grandfather   . Cancer Maternal Uncle        type unk, dx 80's    SOCIAL HISTORY:   Social History   Tobacco Use  . Smoking status: Never Smoker  . Smokeless tobacco: Never Used  Substance Use Topics  . Alcohol use: No  . Drug use: No    ALLERGIES:  is allergic to no known allergies.  MEDICATIONS:  Current Outpatient Medications  Medication Sig Dispense Refill  . dexamethasone (DECADRON) 4 MG tablet Take 1 tablet (4 mg total) by mouth 2 (two) times daily with a meal. 30 tablet 0  . gabapentin (NEURONTIN) 100 MG capsule Take 1 capsule (100 mg total) by mouth 3 (three) times daily. 90 capsule 3  . levETIRAcetam (KEPPRA) 500 MG tablet Take 1 tablet (500 mg total) by mouth 2 (two) times daily. 60 tablet 3  . oxybutynin (DITROPAN-XL) 5 MG 24 hr tablet Take 1 tablet (5 mg total) by mouth at bedtime. 90 tablet 3  . tamsulosin (FLOMAX) 0.4 MG CAPS capsule TAKE 1 CAPSULE BY MOUTH DAILY (Patient taking differently: Take 0.4 mg by mouth at bedtime. ) 90 capsule 3  . Triptorelin Pamoate (TRELSTAR) 22.5 MG injection Inject 22.5 mg into the muscle every 6 (six) months.    . valACYclovir (VALTREX) 1000 MG tablet Take 1 tablet (1,000 mg total) by mouth 2 (two) times daily. (Patient taking differently: Take 1,000 mg by mouth 2 (two) times daily as needed (fever blister). ) 60 tablet 6  . ondansetron (ZOFRAN) 8 MG tablet One pill 30 mins prior taking  rubraca to prevent nausea/ vomitting. 60 tablet 3  . rucaparib camsylate (RUBRACA) 300 MG tablet Take 2 tablets (600 mg total) by mouth 2 (two) times daily. (Patient not taking: Reported on 02/17/2019) 120 tablet 3   No current facility-administered medications for this visit.     PHYSICAL EXAMINATION: ECOG PERFORMANCE STATUS: 1 - Symptomatic but completely ambulatory  BP 111/62   Pulse (!) 56   Temp (!) 96.8 F (36 C)   Resp 16   Wt 163 lb (73.9 kg)   BMI 22.11 kg/m   Filed Weights   03/03/19 0925  Weight: 163 lb (73.9 kg)    Physical Exam  Constitutional: He is oriented to person, place, and time and well-developed, well-nourished, and in no distress.  Alone.  Walking by himself.  HENT:  Head: Normocephalic and atraumatic.  Mouth/Throat: Oropharynx is clear and moist. No oropharyngeal exudate.  Eyes: Pupils are equal, round, and reactive to light.  Neck: Normal range of motion. Neck supple.  Cardiovascular: Normal rate and regular rhythm.  Pulmonary/Chest: No respiratory distress. He has no wheezes.  Abdominal: Soft. Bowel sounds are normal. He exhibits no distension and no mass. There is no abdominal tenderness. There is no rebound and no guarding.  Musculoskeletal: Normal  range of motion.        General: No tenderness or edema.  Neurological: He is alert and oriented to person, place, and time.  4 out of 5 weakness noted in the left upper extremity/left lower extremity.  Patient in wheelchair.  Gait not tested  Skin: Skin is warm.  Psychiatric: Affect normal.       LABORATORY DATA:  I have reviewed the data as listed    Component Value Date/Time   NA 140 03/03/2019 0903   NA 137 11/12/2014 1415   K 3.9 03/03/2019 0903   K 4.1 11/12/2014 1415   CL 106 03/03/2019 0903   CL 105 11/12/2014 1415   CO2 25 03/03/2019 0903   CO2 24 11/12/2014 1415   GLUCOSE 102 (H) 03/03/2019 0903   GLUCOSE 98 11/12/2014 1415   BUN 28 (H) 03/03/2019 0903   BUN 24 (H) 11/12/2014  1415   CREATININE 0.90 03/03/2019 0903   CREATININE 1.02 11/12/2014 1415   CALCIUM 9.0 03/03/2019 0903   CALCIUM 9.2 11/12/2014 1415   PROT 6.5 03/03/2019 0903   PROT 7.4 11/12/2014 1415   ALBUMIN 3.6 03/03/2019 0903   ALBUMIN 4.4 11/12/2014 1415   AST 16 03/03/2019 0903   AST 23 11/12/2014 1415   ALT 17 03/03/2019 0903   ALT 16 (L) 11/12/2014 1415   ALKPHOS 66 03/03/2019 0903   ALKPHOS 103 11/12/2014 1415   BILITOT 0.6 03/03/2019 0903   BILITOT 0.4 11/12/2014 1415   GFRNONAA >60 03/03/2019 0903   GFRNONAA >60 11/12/2014 1415   GFRAA >60 03/03/2019 0903   GFRAA >60 11/12/2014 1415    No results found for: SPEP, UPEP  Lab Results  Component Value Date   WBC 4.0 03/03/2019   NEUTROABS 2.9 03/03/2019   HGB 12.5 (L) 03/03/2019   HCT 37.3 (L) 03/03/2019   MCV 98.9 03/03/2019   PLT 135 (L) 03/03/2019      Chemistry      Component Value Date/Time   NA 140 03/03/2019 0903   NA 137 11/12/2014 1415   K 3.9 03/03/2019 0903   K 4.1 11/12/2014 1415   CL 106 03/03/2019 0903   CL 105 11/12/2014 1415   CO2 25 03/03/2019 0903   CO2 24 11/12/2014 1415   BUN 28 (H) 03/03/2019 0903   BUN 24 (H) 11/12/2014 1415   CREATININE 0.90 03/03/2019 0903   CREATININE 1.02 11/12/2014 1415      Component Value Date/Time   CALCIUM 9.0 03/03/2019 0903   CALCIUM 9.2 11/12/2014 1415   ALKPHOS 66 03/03/2019 0903   ALKPHOS 103 11/12/2014 1415   AST 16 03/03/2019 0903   AST 23 11/12/2014 1415   ALT 17 03/03/2019 0903   ALT 16 (L) 11/12/2014 1415   BILITOT 0.6 03/03/2019 0903   BILITOT 0.4 11/12/2014 1415       RADIOGRAPHIC STUDIES: I have personally reviewed the radiological images as listed and agreed with the findings in the report. No results found.   ASSESSMENT & PLAN:  Prostate cancer metastatic to multiple sites [facility]) # Castrate resistant prostate cancer-stage IV; on Trelstar q 49M [09/27/2018]; May 28 PET- Progression; clinically progressing/worsening with new brain metastasis  [see below]; PSA- 43; rising s/p WBRT [finished 08/06]  # recommend starting Rubraca today.  Again reviewed the emetic potential with Rubraca recommend Zofran prior to taking the medication.  # Brain metastasis/causing weakness- wrose; increase  Currently on Dex 2 mg q day. Recommend increasing back to 4 mg BID. Continue keppra.   #  Constipation- recommend miralax BID; continue laxatives.  # heart burn- Steroids- dex; prilosec BID; 1 hour prior meals.   # Bone mets/pain second malignancy; stable  Continue Xgeva next visit.  #Goals of care:  # DISPOSITION: # X-geva today # follow up in 2 weeks-MD/bmp/psa-  Dr.B      Orders Placed This Encounter  Procedures  . Basic metabolic panel    Standing Status:   Future    Standing Expiration Date:   03/02/2020  . PSA    Standing Status:   Future    Standing Expiration Date:   03/02/2020   All questions were answered. The patient knows to call the clinic with any problems, questions or concerns.      Cammie Sickle, MD 03/03/2019 10:54 AM

## 2019-03-03 NOTE — Assessment & Plan Note (Addendum)
#   Castrate resistant prostate cancer-stage IV; on Trelstar q 6M [09/27/2018]; May 28 PET- Progression; clinically progressing/worsening with new brain metastasis [see below]; PSA- 43; rising s/p WBRT [finished 08/06]  # recommend starting Rubraca today.  Again reviewed the emetic potential with Rubraca recommend Zofran prior to taking the medication.  # Brain metastasis/causing weakness- wrose; increase  Currently on Dex 2 mg q day. Recommend increasing back to 4 mg BID. Continue keppra.   # Constipation- recommend miralax BID; continue laxatives.  # heart burn- Steroids- dex; prilosec BID; 1 hour prior meals.   # Bone mets/pain second malignancy; stable  Continue Xgeva next visit.  #Goals of care:  # DISPOSITION: # X-geva today # follow up in 2 weeks-MD/bmp/psa-  Dr.B

## 2019-03-03 NOTE — Progress Notes (Signed)
The left side weakness seems to be getting worse.  He is having a lot of acid reflux after eating and especially at night.  Not sleeping well at night and would like to discuss with MD.

## 2019-03-10 ENCOUNTER — Telehealth: Payer: Self-pay

## 2019-03-10 NOTE — Telephone Encounter (Signed)
Telephone call to patient's home, spoke with patient's wife [name].  Sherry in agreement with RN and SW making home visit on Thursday 03-13-19 at 11:00.

## 2019-03-13 ENCOUNTER — Other Ambulatory Visit: Payer: Medicare Other

## 2019-03-13 ENCOUNTER — Other Ambulatory Visit: Payer: Self-pay

## 2019-03-13 DIAGNOSIS — Z515 Encounter for palliative care: Secondary | ICD-10-CM

## 2019-03-13 NOTE — Progress Notes (Signed)
COMMUNITY PALLIATIVE CARE SW NOTE  PATIENT NAME: William Wright DOB: May 31, 1951 MRN: [id number]  PRIMARY CARE PROVIDER: Cammie Sickle, MD  RESPONSIBLE PARTY:  Acct ID - Guarantor Home Phone Work Phone Relationship Acct Type  [id number] [name]  Self P/F     [address], [place], [place] [address]     PLAN OF CARE and INTERVENTIONS:             1. GOALS OF CARE/ ADVANCE CARE PLANNING:  Patient wants to remain at home and maintain quality of life. HCPOA is [employer]. Team discussed code status. Patient wants to remain a FULL CODE. MOST form discussed and reviewed, to be completed by Altha Harm, NP on Monday (8/31).  2. SOCIAL/EMOTIONAL/SPIRITUAL ASSESSMENT/ INTERVENTIONS:  SW and RN met with patient and patient's wife, William Wright. Patient reports doing "okay". Patient noted good appetite. Patient has trouble staying asleep due to frequent urination. Patient feels less fatigued now versus last palliative care visit. Patient is more off balance, and struggles to walk. RN to request home health PT. Patient and William Wright discussed the importance of maintaining quality of life. Patient is not taking chemo pill that was prescribed due to concerns with how it would make him feel. Patient enjoys sitting outside in the sun, and spending time with family. Team validated patient's feelings, used active and reflective listening, and discussed care goals. Patient appreciative of palliative care team support. 3. PATIENT/CAREGIVER EDUCATION/ COPING:  Discussed patient's changes. Discussed DME that could help with patient care at home. Sherry to discuss with PT. Patient is positive regarding his prognosis. Patient likes to joke and laugh during visits. Patient has strong family support. William Wright noted that she has "bad" days but she is committed to supporting patient and his goals. 4. PERSONAL EMERGENCY PLAN:  Family would call 9-1-1 for emergencies. Due to COVID-19, patient is limiting contacts  and only going out for appointments. 5. COMMUNITY RESOURCES COORDINATION/ HEALTH CARE NAVIGATION:  William Wright helps coordinate all patient's care. Patient is scheduled for labs and to see Dr. [name] and Altha Harm, NP on Monday. 6. FINANCIAL/LEGAL CONCERNS/INTERVENTIONS:  None.     SOCIAL HX:  Social History   Tobacco Use  . Smoking status: Never Smoker  . Smokeless tobacco: Never Used  Substance Use Topics  . Alcohol use: No    CODE STATUS:   Code Status: Prior (FULL CODE) ADVANCED DIRECTIVES: Y MOST FORM COMPLETE:  In progress. HOSPICE EDUCATION PROVIDED: Yes.  PPS: Patient is now dependent of most ADLs. Patient has started to use a cane to help with balance.   I spent60 minutes with this patient from11:00a-12:00pprovidingeducation,consultationandsupport.  Margaretmary Lombard, LCSW

## 2019-03-13 NOTE — Progress Notes (Signed)
PATIENT NAME: William Wright DOB: [birth year] MRN: [id number]  PRIMARY CARE PROVIDER: Cammie Sickle, MD  RESPONSIBLE PARTY:  Acct ID - Guarantor Home Phone Work Phone Relationship Acct Type  [id number] William Limerick782 [phone]  Self P/F     [address], [place], [place] [address]    PLAN OF CARE and INTERVENTIONS:               1.  GOALS OF CARE/ ADVANCE CARE PLANNING:  Remain at home with wife. Patient has made decision not to take chemo.                  2.  PATIENT/CAREGIVER EDUCATION: Education on fall precautions, education on benefit of a PT referral, support               3.  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  SW and RN made home visit today. Patients sitting in his chair, [name] in home with patient. Patient continues to have left-sided weakness and is unsteady on his feet. Wife reports she is having a difficult time getting patient to sit up in bed, get patient out of bed and get patient positioned in bed. Patient and wife have a waterbed so bed assist rail would not help. SW and RN talk with wife about a trapeze bar may be beneficial. Patient suffered a fall over the weekend but did not sustain any injuries. Wife states her grandson assisted her with getting patient up from floor. Patient is scared he will fall again. SW and RN talk with patient wife about benefits of a PT referral. Nurse contacted palliative care NP William Wright to request palliative PT referral for patient as patient and wife are both in agreement. Wife states patient has made decision he does not want to take oral chemo. Patient is using his cane rather than walking stick and wife must assist patient when ambulating. Patient has appointment to see doctor William Wright on Monday. Wife reports patient did complain of some left thigh muscle pain. Patient feels this is due to him struggling to get out of bed. Wife feels patient is not as fatigued as he  was and feels like side effects of radiation has improved. Wife reports patients appetite is good. Patient has been waking frequently at night due to having to urinate. Patient has not suffered any seizures and remains on anti-seizure medication. Patients current weight is 163 pounds. Patient also continues to take oxybutynin and Flomax as well as steroid for brain mets. Patient wife have completed financial and [employer] and social worker assists patient with filling out MOST form. Patient wants limited treatment but does not want to be intubated. Patient and wife state they are not ready for hospice referral as patient would be eligible since he is not receiving treatment. Support provided to patient and wife and both were encouraged to contact palliative care team with questions or concerns.    HISTORY OF PRESENT ILLNESS:    CODE STATUS: Limited Code (Patient filling put MOST Form)  ADVANCED DIRECTIVES: Y MOST FORM: (In progress) PPS: 40%   PHYSICAL EXAM:   VITALS: See vital signs  LUNGS: Clear breath sounds, no cough or shortness of breath CARDIAC: Cor RRR  EXTREMITIES: Trace edema SKIN: Skin color, texture, turgor normal. No rashes or lesions  NEURO: positive for gait problems and memory problems       William Simmer,  RN 

## 2019-03-16 DIAGNOSIS — Z192 Hormone resistant malignancy status: Secondary | ICD-10-CM | POA: Diagnosis not present

## 2019-03-16 DIAGNOSIS — C61 Malignant neoplasm of prostate: Secondary | ICD-10-CM | POA: Diagnosis not present

## 2019-03-16 DIAGNOSIS — Z9181 History of falling: Secondary | ICD-10-CM | POA: Diagnosis not present

## 2019-03-16 DIAGNOSIS — C7949 Secondary malignant neoplasm of other parts of nervous system: Secondary | ICD-10-CM | POA: Diagnosis not present

## 2019-03-16 DIAGNOSIS — R531 Weakness: Secondary | ICD-10-CM | POA: Diagnosis not present

## 2019-03-16 DIAGNOSIS — C7931 Secondary malignant neoplasm of brain: Secondary | ICD-10-CM | POA: Diagnosis not present

## 2019-03-16 DIAGNOSIS — Z9221 Personal history of antineoplastic chemotherapy: Secondary | ICD-10-CM | POA: Diagnosis not present

## 2019-03-16 DIAGNOSIS — C7951 Secondary malignant neoplasm of bone: Secondary | ICD-10-CM | POA: Diagnosis not present

## 2019-03-16 DIAGNOSIS — Z806 Family history of leukemia: Secondary | ICD-10-CM | POA: Diagnosis not present

## 2019-03-16 DIAGNOSIS — Z923 Personal history of irradiation: Secondary | ICD-10-CM | POA: Diagnosis not present

## 2019-03-16 DIAGNOSIS — R2681 Unsteadiness on feet: Secondary | ICD-10-CM | POA: Diagnosis not present

## 2019-03-17 ENCOUNTER — Other Ambulatory Visit: Payer: Self-pay

## 2019-03-17 ENCOUNTER — Inpatient Hospital Stay: Payer: Medicare Other

## 2019-03-17 ENCOUNTER — Inpatient Hospital Stay (HOSPITAL_BASED_OUTPATIENT_CLINIC_OR_DEPARTMENT_OTHER): Payer: Medicare Other | Admitting: Hospice and Palliative Medicine

## 2019-03-17 ENCOUNTER — Telehealth: Payer: Self-pay | Admitting: *Deleted

## 2019-03-17 ENCOUNTER — Inpatient Hospital Stay (HOSPITAL_BASED_OUTPATIENT_CLINIC_OR_DEPARTMENT_OTHER): Payer: Medicare Other | Admitting: Internal Medicine

## 2019-03-17 DIAGNOSIS — Z515 Encounter for palliative care: Secondary | ICD-10-CM | POA: Diagnosis not present

## 2019-03-17 DIAGNOSIS — C61 Malignant neoplasm of prostate: Secondary | ICD-10-CM

## 2019-03-17 DIAGNOSIS — C7951 Secondary malignant neoplasm of bone: Secondary | ICD-10-CM | POA: Diagnosis not present

## 2019-03-17 DIAGNOSIS — R5381 Other malaise: Secondary | ICD-10-CM | POA: Diagnosis not present

## 2019-03-17 DIAGNOSIS — R5383 Other fatigue: Secondary | ICD-10-CM | POA: Diagnosis not present

## 2019-03-17 DIAGNOSIS — C7931 Secondary malignant neoplasm of brain: Secondary | ICD-10-CM | POA: Diagnosis not present

## 2019-03-17 LAB — CBC WITH DIFFERENTIAL/PLATELET
Abs Immature Granulocytes: 0.07 10*3/uL (ref 0.00–0.07)
Basophils Absolute: 0 10*3/uL (ref 0.0–0.1)
Basophils Relative: 0 %
Eosinophils Absolute: 0 10*3/uL (ref 0.0–0.5)
Eosinophils Relative: 0 %
HCT: 39 % (ref 39.0–52.0)
Hemoglobin: 13 g/dL (ref 13.0–17.0)
Immature Granulocytes: 1 %
Lymphocytes Relative: 8 %
Lymphs Abs: 0.5 10*3/uL — ABNORMAL LOW (ref 0.7–4.0)
MCH: 32.3 pg (ref 26.0–34.0)
MCHC: 33.3 g/dL (ref 30.0–36.0)
MCV: 97 fL (ref 80.0–100.0)
Monocytes Absolute: 0.6 10*3/uL (ref 0.1–1.0)
Monocytes Relative: 9 %
Neutro Abs: 5.4 10*3/uL (ref 1.7–7.7)
Neutrophils Relative %: 82 %
Platelets: 167 10*3/uL (ref 150–400)
RBC: 4.02 MIL/uL — ABNORMAL LOW (ref 4.22–5.81)
RDW: 12.9 % (ref 11.5–15.5)
WBC: 6.6 10*3/uL (ref 4.0–10.5)
nRBC: 0 % (ref 0.0–0.2)

## 2019-03-17 LAB — BASIC METABOLIC PANEL
Anion gap: 8 (ref 5–15)
BUN: 30 mg/dL — ABNORMAL HIGH (ref 8–23)
CO2: 27 mmol/L (ref 22–32)
Calcium: 9.1 mg/dL (ref 8.9–10.3)
Chloride: 103 mmol/L (ref 98–111)
Creatinine, Ser: 0.92 mg/dL (ref 0.61–1.24)
GFR calc Af Amer: >60 mL/min (ref >60)
GFR calc non Af Amer: >60 mL/min (ref >60)
Glucose, Bld: 98 mg/dL (ref 70–99)
Potassium: 4 mmol/L (ref 3.5–5.1)
Sodium: 138 mmol/L (ref 135–145)

## 2019-03-17 NOTE — Assessment & Plan Note (Addendum)
#   Castrate resistant prostate cancer-stage IV; on Trelstar q 2M [09/27/2018]; May 28 PET- Progression; clinically progressing/worsening with new brain metastasis [see below]; PSA- 43; rising s/p WBRT [finished 08/06]  # DECLINES starting Rubraca today-is concerned about his quality of life/side effects of Rubraca.  Multiple discussions regarding most certainly progressive decline in the quality of life of therapy; and there is a 40 to 50% chance of improvement/response on treatment.  Patient continues to be concerned about the potential side effects; declines further therapy.  He agrees to bring the pill bottles to visit next visit.  # Brain metastasis/causing weakness- STABLE; start tapering the steroids. Continue keppra. #Recommend dexamethasone 4 mg once a day for 1 week; half a pill [2 mg] once a day for 1 week; and then 2 mg every other day for 1 week and stop  # Constipation- STABLE> recommend miralax BID; continue laxatives.  # Bone mets/pain second malignancy; STABLE; Continue Xgeva next visit.  # Goals of care: Patient is interested in hospice.  However understands that this would curtail his access to physical therapy/home health.  Wants to continue current treatment plan.  We will hold off hospice referral at this time.  Will discuss again at next visit.  Discussed with Josh.  # DISPOSITION: # possible X-geva  # follow up in 2 weeks-MD/bmp/psa-  Dr.B

## 2019-03-17 NOTE — Telephone Encounter (Signed)
William Wright with [facility] called asking for order approval for Physical therapy twice a week for 4 weeks and for an updated order for heart rate parameters. Patient runs 50 -52 as a norm and their parameters are to call doctor for HR <60. She also sent an order over for a front wheeled walker for physician to sign.

## 2019-03-17 NOTE — Patient Instructions (Signed)
#  Recommend dexamethasone 4 mg once a day for 1 week; half a pill [2 mg] once a day for 1 week; and then 2 mg every other day for 1 week and stop

## 2019-03-17 NOTE — Telephone Encounter (Signed)
Called and spoke with [name], RN with [facility]. Orders given for PT and to change parameters for reporting of HR to <50 or if patient is symptomatic.

## 2019-03-17 NOTE — Progress Notes (Signed)
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   # MARCH 2019- Taken off trial [ based on interm analysis]; continue X-tandi [given not needing steroids]  # July 2019- CT/bone scan stable; MRI lumbar spine- epidural extension/L3 spinal nerves involvement; on RT [finish sep 12th];  #May 28 PET scan-progressive disease; patient continues Xtandi/reluctant with other options.  #July 10th-  2020-brain metastases; [facility] neurosurgery evaluation; stop Xtandi.  S July 17 seizure; July 20-th whole brain radiation  # [facility]bro[facility] [[facility]] on sep 13th 2019.  # RLL [~91m lung nodule] s/p Bx- Cryptococcus- ID/Dr.Fitzgerald- on diflucan  # Xgeva  # MOLECULAR TESTING/omniseq- BRCA-2 MUTATED; PDL-1- NEG; MSS; N-TRK-NEG  # GENETICS- BRCA-NEG; hetrozygous MUTHY; recommended family work up --------------------------------------------------    DIAGNOSIS: _0  Metastatic prostate cancer  STAGE: 4    ;GOALS: Palliative  CURRENT/MOST RECENT THERAPY-X-tandi+ Trelstar    [place]  08/17/2012 Initial Diagnosis   Cancer of prostate, stage II   12/03/2013 Progression     10/13/2014 Progression     Prostate cancer metastatic to multiple sites ([facility]      INTERVAL HISTORY:  William DOBBINS89y.o.  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   Patient has not started [place].  Is concerned about starting Rubraca given the potential side effects.  Is currently concerned about the quality of life.  Patient is currently taking dexamethasone 4 mg twice a day.  He did not notice any worsening of his left lower extremity/upper extremity weakness.  His appetite is good.  No weight loss.  He is currently working with physical therapy.  Review of Systems  Constitutional: Positive for malaise/fatigue. Negative for chills, diaphoresis and fever.  HENT: Negative for nosebleeds and sore throat.   Eyes: Negative for double vision.  Respiratory: Negative for cough, hemoptysis, sputum production, shortness of breath and wheezing.   Cardiovascular: Negative for chest pain, palpitations, orthopnea and leg swelling.  Gastrointestinal: Negative for abdominal pain, blood in stool, constipation, diarrhea, heartburn, melena, nausea and vomiting.  Genitourinary: Negative for dysuria, frequency and urgency.  Musculoskeletal: Positive for back pain and joint pain.  Skin: Negative.  Negative for itching and rash.  Neurological: Positive for focal weakness. Negative for dizziness, weakness and headaches.  Endo/Heme/Allergies: Does not bruise/bleed easily.  Psychiatric/Behavioral: Negative for depression. The patient does not have insomnia.       PAST MEDICAL HISTORY :  Past Medical History:  Diagnosis Date  . Cancer of prostate ([place] 07/[place]   OraL chemo pill.   . Family history of leukemia   . Unstable gait     PAST SURGICAL HISTORY :   Past Surgical History:  Procedure Laterality Date  . EXTRACORPOREAL SHOCK WAVE LITHOTRIPSY Left  05/20/2015   Procedure: EXTRACORPOREAL SHOCK WAVE LITHOTRIPSY (ESWL);  Surgeon:  Royston Cowper, MD;  Location: [facility] ORS;  Service: Urology;  Laterality: Left;  . EXTRACORPOREAL SHOCK WAVE LITHOTRIPSY Right 12/21/2016   Procedure: EXTRACORPOREAL SHOCK WAVE LITHOTRIPSY (ESWL);  Surgeon: Royston Cowper, MD;  Location: [facility] ORS;  Service: Urology;  Laterality: Right;  . IR RADIOLOGIST EVAL & MGMT  02/28/2018    FAMILY HISTORY :   Family History  Problem Relation Age of Onset  . Alzheimer's disease Father   . Lung cancer Maternal Grandfather 46  . Leukemia Paternal Grandmother 51  . COPD Paternal Grandfather   . Cancer Maternal Uncle        type unk, dx 80's    SOCIAL HISTORY:   Social History   Tobacco Use  . Smoking status: Never Smoker  . Smokeless tobacco: Never Used  Substance Use Topics  . Alcohol use: No  . Drug use: No    ALLERGIES:  is allergic to no known allergies.  MEDICATIONS:  Current Outpatient Medications  Medication Sig Dispense Refill  . dexamethasone (DECADRON) 4 MG tablet Take 1 tablet (4 mg total) by mouth 2 (two) times daily. 60 tablet 0  . gabapentin (NEURONTIN) 100 MG capsule Take 1 capsule (100 mg total) by mouth 3 (three) times daily. 90 capsule 3  . levETIRAcetam (KEPPRA) 500 MG tablet Take 1 tablet (500 mg total) by mouth 2 (two) times daily. 60 tablet 3  . ondansetron (ZOFRAN) 8 MG tablet One pill 30 mins prior taking rubraca to prevent nausea/ vomitting. 60 tablet 3  . oxybutynin (DITROPAN-XL) 5 MG 24 hr tablet Take 1 tablet (5 mg total) by mouth at bedtime. 90 tablet 3  . tamsulosin (FLOMAX) 0.4 MG CAPS capsule TAKE 1 CAPSULE BY MOUTH DAILY (Patient taking differently: Take 0.4 mg by mouth at bedtime. ) 90 capsule 3  . Triptorelin Pamoate (TRELSTAR) 22.5 MG injection Inject 22.5 mg into the muscle every 6 (six) months.    . valACYclovir (VALTREX) 1000 MG tablet Take 1 tablet (1,000 mg total) by mouth 2 (two) times daily. (Patient taking differently: Take 1,000 mg by mouth 2 (two) times daily as needed (fever blister). ) 60 tablet  6  . rucaparib camsylate (RUBRACA) 300 MG tablet Take 2 tablets (600 mg total) by mouth 2 (two) times daily. (Patient not taking: Reported on 02/17/2019) 120 tablet 3   No current facility-administered medications for this visit.     PHYSICAL EXAMINATION: ECOG PERFORMANCE STATUS: 1 - Symptomatic but completely ambulatory  BP 116/70   Pulse (!) 47   Temp (!) 97.2 F (36.2 C) (Tympanic)   Resp 20   Ht 6' (1.829 m)   Wt 162 lb (73.5 kg)   BMI 21.97 kg/m   Filed Weights   03/17/19 1049  Weight: 162 lb (73.5 kg)    Physical Exam  Constitutional: He is oriented to person, place, and time and well-developed, well-nourished, and in no distress.  Alone.  Walking by himself.  HENT:  Head: Normocephalic and atraumatic.  Mouth/Throat: Oropharynx is clear and moist. No oropharyngeal exudate.  Eyes: Pupils are equal, round, and reactive to light.  Neck: Normal range of motion. Neck supple.  Cardiovascular: Normal rate and regular rhythm.  Pulmonary/Chest: No respiratory distress. He has no wheezes.  Abdominal: Soft. Bowel sounds are normal. He exhibits no distension and no mass. There is no abdominal tenderness. There is no rebound and no guarding.  Musculoskeletal: Normal range of motion.  General: No tenderness or edema.  Neurological: He is alert and oriented to person, place, and time.  4 out of 5 weakness noted in the left upper extremity/left lower extremity.  Patient in wheelchair.  Gait not tested  Skin: Skin is warm.  Psychiatric: Affect normal.       LABORATORY DATA:  I have reviewed the data as listed    Component Value Date/Time   NA 138 03/17/2019 1004   NA 137 11/12/2014 1415   K 4.0 03/17/2019 1004   K 4.1 11/12/2014 1415   CL 103 03/17/2019 1004   CL 105 11/12/2014 1415   CO2 27 03/17/2019 1004   CO2 24 11/12/2014 1415   GLUCOSE 98 03/17/2019 1004   GLUCOSE 98 11/12/2014 1415   BUN 30 (H) 03/17/2019 1004   BUN 24 (H) 11/12/2014 1415   CREATININE  0.92 03/17/2019 1004   CREATININE 1.02 11/12/2014 1415   CALCIUM 9.1 03/17/2019 1004   CALCIUM 9.2 11/12/2014 1415   PROT 6.5 03/03/2019 0903   PROT 7.4 11/12/2014 1415   ALBUMIN 3.6 03/03/2019 0903   ALBUMIN 4.4 11/12/2014 1415   AST 16 03/03/2019 0903   AST 23 11/12/2014 1415   ALT 17 03/03/2019 0903   ALT 16 (L) 11/12/2014 1415   ALKPHOS 66 03/03/2019 0903   ALKPHOS 103 11/12/2014 1415   BILITOT 0.6 03/03/2019 0903   BILITOT 0.4 11/12/2014 1415   GFRNONAA >60 03/17/2019 1004   GFRNONAA >60 11/12/2014 1415   GFRAA >60 03/17/2019 1004   GFRAA >60 11/12/2014 1415    No results found for: SPEP, UPEP  Lab Results  Component Value Date   WBC 6.6 03/17/2019   NEUTROABS 5.4 03/17/2019   HGB 13.0 03/17/2019   HCT 39.0 03/17/2019   MCV 97.0 03/17/2019   PLT 167 03/17/2019      Chemistry      Component Value Date/Time   NA 138 03/17/2019 1004   NA 137 11/12/2014 1415   K 4.0 03/17/2019 1004   K 4.1 11/12/2014 1415   CL 103 03/17/2019 1004   CL 105 11/12/2014 1415   CO2 27 03/17/2019 1004   CO2 24 11/12/2014 1415   BUN 30 (H) 03/17/2019 1004   BUN 24 (H) 11/12/2014 1415   CREATININE 0.92 03/17/2019 1004   CREATININE 1.02 11/12/2014 1415      Component Value Date/Time   CALCIUM 9.1 03/17/2019 1004   CALCIUM 9.2 11/12/2014 1415   ALKPHOS 66 03/03/2019 0903   ALKPHOS 103 11/12/2014 1415   AST 16 03/03/2019 0903   AST 23 11/12/2014 1415   ALT 17 03/03/2019 0903   ALT 16 (L) 11/12/2014 1415   BILITOT 0.6 03/03/2019 0903   BILITOT 0.4 11/12/2014 1415       RADIOGRAPHIC STUDIES: I have personally reviewed the radiological images as listed and agreed with the findings in the report. No results found.   ASSESSMENT & PLAN:  Prostate cancer metastatic to multiple sites [facility]) # Castrate resistant prostate cancer-stage IV; on Trelstar q 60M [09/27/2018]; May 28 PET- Progression; clinically progressing/worsening with new brain metastasis [see below]; PSA- 43; rising s/p  WBRT [finished 08/06]  # DECLINES starting Rubraca today-is concerned about his quality of life/side effects of Rubraca.  Multiple discussions regarding most certainly progressive decline in the quality of life of therapy; and there is a 40 to 50% chance of improvement/response on treatment.  Patient continues to be concerned about the potential side effects; declines further therapy.  He agrees  to bring the pill bottles to visit next visit.  # Brain metastasis/causing weakness- STABLE; start tapering the steroids. Continue keppra. #Recommend dexamethasone 4 mg once a day for 1 week; half a pill [2 mg] once a day for 1 week; and then 2 mg every other day for 1 week and stop  # Constipation- STABLE> recommend miralax BID; continue laxatives.  # Bone mets/pain second malignancy; STABLE; Continue Xgeva next visit.  # Goals of care: Patient is interested in hospice.  However understands that this would curtail his access to physical therapy/home health.  Wants to continue current treatment plan.  We will hold off hospice referral at this time.  Will discuss again at next visit.  Discussed with Josh.  # DISPOSITION: # possible X-geva  # follow up in 2 weeks-MD/bmp/psa-  Dr.B    No orders of the defined types were placed in this encounter.  All questions were answered. The patient knows to call the clinic with any problems, questions or concerns.      Cammie Sickle, MD 03/17/2019 11:38 AM

## 2019-03-17 NOTE — Progress Notes (Signed)
[place]  [phone] [phone]   Name: William Wright Date: 03/17/2019 MRN: [id number]  DOB: 06/12/1951  Patient Care Team: Cammie Sickle, MD as PCP - General (Internal Medicine) Royston Cowper, MD (Urology)    REASON FOR CONSULTATION: Palliative Care consult requested for this 68 y.o. 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  Patient has had disease progression on previous lines of treatment.  He is being rotated to [place] with palliative intent.  Patient was referred to palliative care to help address goals and manage ongoing symptoms.   SOCIAL HISTORY:     reports that he has never smoked. He has never used smokeless tobacco. He reports that he does not drink alcohol or use drugs.   Patient is married and lives at home with his wife of 40+ years.  His son also lives in the house.  Patient has a daughter who lives nearby.  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  He has a Oceanographer in counseling from [employer].  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  ADVANCE DIRECTIVES:  Wife is his financial and healthcare power of attorney  CODE STATUS:   PAST MEDICAL HISTORY: Past Medical History:  Diagnosis Date  . Cancer of prostate ([place]) 07/2012   OraL chemo pill.   . Family history of leukemia   . Unstable gait     PAST SURGICAL HISTORY:  Past Surgical History:  Procedure Laterality Date  . EXTRACORPOREAL SHOCK WAVE LITHOTRIPSY Left 05/20/2015   Procedure: EXTRACORPOREAL SHOCK WAVE LITHOTRIPSY (ESWL);  Surgeon: Royston Cowper, MD;  Location: [facility] ORS;  Service: Urology;  Laterality: Left;  . EXTRACORPOREAL SHOCK  WAVE LITHOTRIPSY Right 12/21/2016   Procedure: EXTRACORPOREAL SHOCK WAVE LITHOTRIPSY (ESWL);  Surgeon: Royston Cowper, MD;  Location: [facility] ORS;  Service: Urology;  Laterality: Right;  . IR RADIOLOGIST EVAL & MGMT  02/28/2018    HEMATOLOGY/ONCOLOGY HISTORY:  Oncology History Overview Note  # FEB 2014- Prostate cancer Stage II [T2N0]; PA-18; [Dr.Wolfe]  # May 2015-STAGE IV;  PSA 90 on ADT; CT/Bone scan-extensive mets;  casodex+ Trelstar  # June 2016- Castration resistant prostate cancer/[facility]- ZYTIGA plus minus XTANDI; June 28th-CT-C/A/P- Stable Retrocrural/RP/Pelvic LN;Bone scan- Stable sclerotic lesions.   # MARCH 2019- Taken off trial [ based on interm analysis]; continue X-tandi [given not needing steroids]  # July 2019- CT/bone scan stable; MRI lumbar spine- epidural extension/L3 spinal nerves involvement; on RT [finish sep 12th];  #May 28 PET scan-progressive disease; patient continues Xtandi/reluctant with other options.  #July 10th-  2020-brain metastases; [facility] neurosurgery evaluation; stop Xtandi.  S July 17 seizure; July 20-th whole brain radiation  # [facility] on [facility] 13th 2019.  # RLL [~44m lung nodule] s/p Bx- Cryptococcus- ID/Dr.Fitzgerald- on diflucan  # Xgeva  # MOLECULAR TESTING/omniseq- BRCA-2 MUTATED; PDL-1- NEG; MSS; N-TRK-NEG  # GENETICS- BRCA-NEG; hetrozygous MUTHY; recommended family work up --------------------------------------------------    DIAGNOSIS: _0  Metastatic prostate cancer  STAGE: 4    ;GOALS: Palliative  CURRENT/MOST RECENT THERAPY-X-tandi+ Trelstar    [place]  08/17/2012 Initial Diagnosis   Cancer of prostate, stage II   12/03/2013 Progression     10/13/2014  Progression     Prostate cancer metastatic to multiple sites [facility])    ALLERGIES:  is allergic to no known allergies.  MEDICATIONS:  Current Outpatient Medications  Medication Sig Dispense Refill  . dexamethasone (DECADRON) 4 MG tablet  Take 1 tablet (4 mg total) by mouth 2 (two) times daily. 60 tablet 0  . gabapentin (NEURONTIN) 100 MG capsule Take 1 capsule (100 mg total) by mouth 3 (three) times daily. 90 capsule 3  . levETIRAcetam (KEPPRA) 500 MG tablet Take 1 tablet (500 mg total) by mouth 2 (two) times daily. 60 tablet 3  . ondansetron (ZOFRAN) 8 MG tablet One pill 30 mins prior taking rubraca to prevent nausea/ vomitting. 60 tablet 3  . oxybutynin (DITROPAN-XL) 5 MG 24 hr tablet Take 1 tablet (5 mg total) by mouth at bedtime. 90 tablet 3  . rucaparib camsylate (RUBRACA) 300 MG tablet Take 2 tablets (600 mg total) by mouth 2 (two) times daily. (Patient not taking: Reported on 02/17/2019) 120 tablet 3  . tamsulosin (FLOMAX) 0.4 MG CAPS capsule TAKE 1 CAPSULE BY MOUTH DAILY (Patient taking differently: Take 0.4 mg by mouth at bedtime. ) 90 capsule 3  . Triptorelin Pamoate (TRELSTAR) 22.5 MG injection Inject 22.5 mg into the muscle every 6 (six) months.    . valACYclovir (VALTREX) 1000 MG tablet Take 1 tablet (1,000 mg total) by mouth 2 (two) times daily. (Patient taking differently: Take 1,000 mg by mouth 2 (two) times daily as needed (fever blister). ) 60 tablet 6   No current facility-administered medications for this visit.     VITAL SIGNS: There were no vitals taken for this visit. There were no vitals filed for this visit.  Estimated body mass index is 21.97 kg/m as calculated from the following:   Height as of an earlier encounter on 03/17/19: 6' (1.829 m).   Weight as of an earlier encounter on 03/17/19: 162 lb (73.5 kg).  LABS: CBC:    Component Value Date/Time   WBC 6.6 03/17/2019 1004   HGB 13.0 03/17/2019 1004   HGB 13.0 11/12/2014 1415   HCT 39.0 03/17/2019 1004   HCT 37.7 (L) 11/12/2014 1415   PLT 167 03/17/2019 1004   PLT 224 11/12/2014 1415   MCV 97.0 03/17/2019 1004   MCV 91 11/12/2014 1415   NEUTROABS 5.4 03/17/2019 1004   NEUTROABS 3.5 11/12/2014 1415   LYMPHSABS 0.5 (L) 03/17/2019 1004    LYMPHSABS 1.3 11/12/2014 1415   MONOABS 0.6 03/17/2019 1004   MONOABS 0.5 11/12/2014 1415   EOSABS 0.0 03/17/2019 1004   EOSABS 0.3 11/12/2014 1415   BASOSABS 0.0 03/17/2019 1004   BASOSABS 0.1 11/12/2014 1415   Comprehensive Metabolic Panel:    Component Value Date/Time   NA 138 03/17/2019 1004   NA 137 11/12/2014 1415   K 4.0 03/17/2019 1004   K 4.1 11/12/2014 1415   CL 103 03/17/2019 1004   CL 105 11/12/2014 1415   CO2 27 03/17/2019 1004   CO2 24 11/12/2014 1415   BUN 30 (H) 03/17/2019 1004   BUN 24 (H) 11/12/2014 1415   CREATININE 0.92 03/17/2019 1004   CREATININE 1.02 11/12/2014 1415   GLUCOSE 98 03/17/2019 1004   GLUCOSE 98 11/12/2014 1415   CALCIUM 9.1 03/17/2019 1004   CALCIUM 9.2 11/12/2014 1415   AST 16 03/03/2019 0903   AST 23 11/12/2014 1415   ALT 17 03/03/2019 0903   ALT 16 (L) 11/12/2014 1415   ALKPHOS 66 03/03/2019 0903   ALKPHOS  103 11/12/2014 1415   BILITOT 0.6 03/03/2019 0903   BILITOT 0.4 11/12/2014 1415   PROT 6.5 03/03/2019 0903   PROT 7.4 11/12/2014 1415   ALBUMIN 3.6 03/03/2019 0903   ALBUMIN 4.4 11/12/2014 1415    RADIOGRAPHIC STUDIES: No results found.  PERFORMANCE STATUS (ECOG) : 3 - Symptomatic, >50% confined to bed  Review of Systems Unless otherwise noted, a complete review of systems is negative.  Physical Exam General: NAD, frail appearing, thin, in wheelchair Pulmonary: Unlabored Extremities: no edema, no joint deformities Skin: no rashes Neurological: Weakness but otherwise nonfocal  IMPRESSION: Met with patient for routine follow-up.  Patient also saw Dr. [name] today.  Patient has decided to forego future chemotherapy.  He asked about hospice services, which we discussed in detail.  Patient wanted to move forward with a hospice referral but did not realize that he would have to forego physical therapy/home health.  He would like to continue home health for the next 2 weeks and then discuss hospice again during follow-up  visit.  Patient also recognizes the [place] would likely be an exclusion to hospice care.  Patient says that he completed the MOST Form and will bring it for [place] to discuss in 2 weeks.  PLAN: -Continue current scope of treatment -MOST Form to be completed during next visit -Future consideration of hospice after home health -RTC in 2 weeks   Patient expressed understanding and was in agreement with this plan. He also understands that He can call the clinic at any time with any questions, concerns, or complaints.     Time Total: 30 minutes  Visit consisted of counseling and education dealing with the complex and emotionally intense issues of symptom management and palliative care in the setting of serious and potentially life-threatening illness.Greater than 50%  of this time was spent counseling and coordinating care related to the above assessment and plan.  Signed by: Altha Harm, PhD, NP-C [phone] (Work Cell)

## 2019-03-17 NOTE — Telephone Encounter (Signed)
[name] - can you give orders for the patient on behalf of Dr. Jacinto Reap

## 2019-03-18 LAB — PSA: Prostatic Specific Antigen: 148.47 ng/mL — ABNORMAL HIGH (ref 0.00–4.00)

## 2019-03-20 ENCOUNTER — Telehealth: Payer: Self-pay

## 2019-03-20 ENCOUNTER — Telehealth: Payer: Self-pay | Admitting: Internal Medicine

## 2019-03-20 NOTE — Telephone Encounter (Signed)
Telephone call to patients home, spoke with patients wife [name].  [name] in agreement with palliative care team making home visit 03-27-19 at 10:00 AM.

## 2019-03-20 NOTE — Telephone Encounter (Signed)
I tried to reach patient yesterday to discuss his PSA levels going up at 148; unfortunately that is expected of the worsening disease.  Patient not interested in any active treatments. GB

## 2019-03-26 ENCOUNTER — Encounter: Payer: Self-pay | Admitting: Radiation Oncology

## 2019-03-26 ENCOUNTER — Ambulatory Visit
Admission: RE | Admit: 2019-03-26 | Discharge: 2019-03-26 | Disposition: A | Discharge status: Home / Self Care | Payer: Medicare Other | Source: Ambulatory Visit | Attending: Radiation Oncology | Admitting: Radiation Oncology

## 2019-03-26 ENCOUNTER — Other Ambulatory Visit: Payer: Self-pay

## 2019-03-26 VITALS — BP 120/67 | HR 62 | Temp 96.9°F | Resp 16

## 2019-03-26 DIAGNOSIS — Z192 Hormone resistant malignancy status: Secondary | ICD-10-CM | POA: Insufficient documentation

## 2019-03-26 DIAGNOSIS — C7931 Secondary malignant neoplasm of brain: Secondary | ICD-10-CM

## 2019-03-26 DIAGNOSIS — C61 Malignant neoplasm of prostate: Secondary | ICD-10-CM | POA: Diagnosis not present

## 2019-03-26 DIAGNOSIS — Z923 Personal history of irradiation: Secondary | ICD-10-CM | POA: Insufficient documentation

## 2019-03-26 DIAGNOSIS — C7951 Secondary malignant neoplasm of bone: Secondary | ICD-10-CM | POA: Insufficient documentation

## 2019-03-26 NOTE — Progress Notes (Signed)
Radiation Oncology Follow up Note  Name: William Wright   Date:   03/26/2019 MRN:  [id number] DOB: 19-Nov-1950    This 68 y.o. male presents to the clinic today for 1 month follow-up status post whole brain radiation therapy for metastatic disease as well as.  Palliative radiation therapy to his pelvis for stage IV castrate resistant prostate cancer  REFERRING PROVIDER: Cammie Sickle, *  HPI: Patient is a 68 year old male now at 1 month having completed whole brain radiation therapy for multiple metastatic lesions as well as palliative radiation therapy to his pelvis for bone involvement of castrate resistant prostate cancer seen today in routine follow-up he is doing fairly well he states pain is under control he is really having no significant pain.  Also no change in neurologic status specifically denies headaches change in visual fields or any focal neurologic deficits..  His PSA continues to rise last was 147.  He is declined any active treatment by medical oncology.  COMPLICATIONS OF TREATMENT: none  FOLLOW UP COMPLIANCE: keeps appointments   PHYSICAL EXAM:  BP 120/67 (BP Location: Left Arm, Patient Position: Sitting)   Pulse 62   Temp (!) 96.9 F (36.1 C) (Tympanic)   Resp 16  Well-developed fatigued male in NAD.  Well-developed well-nourished patient in NAD. HEENT reveals PERLA, EOMI, discs not visualized.  Oral cavity is clear. No oral mucosal lesions are identified. Neck is clear without evidence of cervical or supraclavicular adenopathy. Lungs are clear to A&P. Cardiac examination is essentially unremarkable with regular rate and rhythm without murmur rub or thrill. Abdomen is benign with no organomegaly or masses noted. Motor sensory and DTR levels are equal and symmetric in the upper and lower extremities. Cranial nerves II through XII are grossly intact. Proprioception is intact. No peripheral adenopathy or edema is identified. No motor or sensory levels are noted. Crude  visual fields are within normal range.  RADIOLOGY RESULTS: No current films to review  PLAN: Present time we had discussed in the past [place].  With his brain metastasis and progression of disease at this time patient is declining any further therapy.  I will turn follow-up care over to medical oncology.  Would be happy to reevaluate the time should any further palliative treatment be indicated.  I would like to take this opportunity to thank you for allowing me to participate in the care of your patient.Noreene Filbert, MD

## 2019-03-27 ENCOUNTER — Telehealth: Payer: Self-pay | Admitting: *Deleted

## 2019-03-27 ENCOUNTER — Other Ambulatory Visit: Payer: Self-pay | Admitting: *Deleted

## 2019-03-27 ENCOUNTER — Other Ambulatory Visit: Payer: Medicare Other

## 2019-03-27 DIAGNOSIS — Z515 Encounter for palliative care: Secondary | ICD-10-CM

## 2019-03-27 DIAGNOSIS — C61 Malignant neoplasm of prostate: Secondary | ICD-10-CM

## 2019-03-27 DIAGNOSIS — R269 Unspecified abnormalities of gait and mobility: Secondary | ICD-10-CM

## 2019-03-27 DIAGNOSIS — R296 Repeated falls: Secondary | ICD-10-CM

## 2019-03-27 DIAGNOSIS — C7951 Secondary malignant neoplasm of bone: Secondary | ICD-10-CM

## 2019-03-27 NOTE — Progress Notes (Signed)
PATIENT NAME: William Wright DOB: 27-Apr-1951 MRN: [id number]  PRIMARY CARE PROVIDER: Cammie Sickle, MD  RESPONSIBLE PARTY:  Acct ID - Guarantor Home Phone Work Phone Relationship Acct Type  [id number] William [phone]  Self P/F     [address], [place], [place] [address]    PLAN OF CARE and INTERVENTIONS:               1.  GOALS OF CARE/ ADVANCE CARE PLANNING:  Patient wants to remain at home with wife.               2.  PATIENT/CAREGIVER EDUCATION:  Education on fall precautions, education on s/s of infection, support                       5.  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  Patient is declining, currently receiving [facility].    HISTORY OF PRESENT ILLNESS:   SW and RN made home visit today. Patient sitting in his recliner chair. Patients [name] at home and present for visit. Patient saw William Wright radiation oncologist yesterday for follow-up. Patient states he made no changes in medications other than dexamethasone will be decreased and be tapered off. Patient reports having increased discomfort in his abdomen. Patients stomach is distended with decreased bowel sounds. Wife reports patients bowels have been moving daily. Patients appetite has changed and patient states he has no taste for food. Wife reports patient no longer enjoys eating. Patient to see doctor William Wright on Monday. Patient is receiving PT through [facility]. Wife States OT referral was made however OT has not made home visit. Wife reports patient suffered a fall on Tuesday when he got up to go to the bathroom. Wife states she and son had a difficult time getting patient up. Patient also was incontinent of urine after the fall. Patient continues to wake frequently during the night to urinate. Patients breathsounds are clear and patient denies having any cough or shortness of breath. Patient denies having any  nausea or vomiting. Patient "catnaps" during the day per wife. Patient has not had any more pain in left leg after receiving radiation. Patient remains weak on left side due to brain mets. Patient has not suffered any seizures.  Patient to take MOST from to MD's office on Monday to have form signed. Support provided to patient and wife and both were encouraged to contact palliative care team with questions or concerns.   CODE STATUS: Full Code  ADVANCED DIRECTIVES: Y MOST FORM: In Progress/ needs to be signed by MD PPS: 40%   PHYSICAL EXAM:   VITALS: See Vital signs  LUNGS: clear to auscultation  CARDIAC: Cor RRR  EXTREMITIES: Trace edema SKIN: Skin color, texture, turgor normal. No rashes or lesions  NEURO: positive for gait problems, memory problems and weakness       [name], RN

## 2019-03-27 NOTE — Telephone Encounter (Signed)
[facility] Patient called reporting that patient fell 9/8 with no apparent injuries

## 2019-03-27 NOTE — Telephone Encounter (Signed)
Ok thanks for the update. I did fax an order today for a walker.

## 2019-03-27 NOTE — Progress Notes (Signed)
COMMUNITY PALLIATIVE CARE SW NOTE  PATIENT NAME: William Wright DOB: 1950/12/08 MRN: [id number]  PRIMARY CARE PROVIDER: Cammie Sickle, MD  RESPONSIBLE PARTY:  Acct ID - Guarantor Home Phone Work Phone Relationship Acct Type  [id number] William [phone]  Self P/F     [address] [place], [place] [address]     PLAN OF CARE and INTERVENTIONS:             1. GOALS OF CARE/ ADVANCE CARE PLANNING:  Patient wants to remain at home and continue to maintain quality of life. HCPOA is wife, William Wright. Patient wants to remain a FULL CODE. MOST form to be completed by William Harm, NP on Monday (9/14).  2. SOCIAL/EMOTIONAL/SPIRITUAL ASSESSMENT/ INTERVENTIONS:  SW and RN met with patientand patient's wife, William Wright. Patient reports doing "so-so". Patient notes stomach pain. Patient said food does not have the same taste, William Wright encourages him to eat. Patient is sleeping "okay" but wakes to urinate frequently. Patient is now sleeping upstairs so he does not have to go up and down the stairs. Patient enjoys watching shows on television, and spending time with wife. 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 3. PATIENT/CAREGIVER EDUCATION/ COPING:  Patient has flat affect. Patient tries to remain positive about the situation. Patient has strong family support. Team validated patient's feelings, used active and reflective listening, and discussed care. Patient appreciative of palliative care team support. 4. PERSONAL EMERGENCY PLAN:  Family would call 9-1-1 for emergencies.  5. COMMUNITY RESOURCES COORDINATION/ HEALTH CARE NAVIGATION:  William Wright helps coordinate al patient's care. Patient has follow-up with Dr. Rogue Wright and William Harm, NP on Monday. [facility] PT is visiting patient in the home, and it is going well per William Wright and patient. Waiting on OT to schedule visit. 6. FINANCIAL/LEGAL CONCERNS/INTERVENTIONS:  None.     SOCIAL HX:   Social History   Tobacco Use  . Smoking status: Never Smoker  . Smokeless tobacco: Never Used  Substance Use Topics  . Alcohol use: No    CODE STATUS:   Code Status: Prior  ADVANCED DIRECTIVES: Y MOST FORM COMPLETE:  Yes -- to be signed by William Harm, NP. HOSPICE EDUCATION PROVIDED: None.  PPS: Patient is now dependent of most ADLs. Patient has started to use a cane to help with balance, and is considering a walker.  I spent45 minutes with this patient from10:00a-10:45aprovidingeducation,consultationandsupport.  William Lombard, LCSW

## 2019-03-28 ENCOUNTER — Other Ambulatory Visit: Payer: Self-pay

## 2019-03-28 ENCOUNTER — Telehealth: Payer: Self-pay | Admitting: *Deleted

## 2019-03-28 ENCOUNTER — Encounter: Payer: Self-pay | Admitting: Internal Medicine

## 2019-03-28 NOTE — Progress Notes (Signed)
Patient unable to speak on the phone wife provided information for screening.

## 2019-03-28 NOTE — Telephone Encounter (Signed)
refaxed to [facility] patient's DME for the rolling walker with the patient's icd-10 codes included.

## 2019-03-31 ENCOUNTER — Telehealth: Payer: Self-pay | Admitting: *Deleted

## 2019-03-31 ENCOUNTER — Inpatient Hospital Stay (HOSPITAL_BASED_OUTPATIENT_CLINIC_OR_DEPARTMENT_OTHER): Payer: Medicare Other | Admitting: Hospice and Palliative Medicine

## 2019-03-31 ENCOUNTER — Other Ambulatory Visit: Payer: Self-pay

## 2019-03-31 ENCOUNTER — Inpatient Hospital Stay: Payer: Medicare Other | Attending: Internal Medicine

## 2019-03-31 ENCOUNTER — Inpatient Hospital Stay (HOSPITAL_BASED_OUTPATIENT_CLINIC_OR_DEPARTMENT_OTHER): Payer: Medicare Other | Admitting: Internal Medicine

## 2019-03-31 ENCOUNTER — Telehealth: Payer: Self-pay | Admitting: Internal Medicine

## 2019-03-31 DIAGNOSIS — R531 Weakness: Secondary | ICD-10-CM | POA: Insufficient documentation

## 2019-03-31 DIAGNOSIS — Z9221 Personal history of antineoplastic chemotherapy: Secondary | ICD-10-CM | POA: Insufficient documentation

## 2019-03-31 DIAGNOSIS — C7951 Secondary malignant neoplasm of bone: Secondary | ICD-10-CM | POA: Insufficient documentation

## 2019-03-31 DIAGNOSIS — R140 Abdominal distension (gaseous): Secondary | ICD-10-CM | POA: Insufficient documentation

## 2019-03-31 DIAGNOSIS — K5900 Constipation, unspecified: Secondary | ICD-10-CM | POA: Insufficient documentation

## 2019-03-31 DIAGNOSIS — Z515 Encounter for palliative care: Secondary | ICD-10-CM | POA: Insufficient documentation

## 2019-03-31 DIAGNOSIS — C61 Malignant neoplasm of prostate: Secondary | ICD-10-CM | POA: Insufficient documentation

## 2019-03-31 DIAGNOSIS — Z7189 Other specified counseling: Secondary | ICD-10-CM | POA: Diagnosis not present

## 2019-03-31 DIAGNOSIS — R110 Nausea: Secondary | ICD-10-CM | POA: Insufficient documentation

## 2019-03-31 DIAGNOSIS — Z66 Do not resuscitate: Secondary | ICD-10-CM | POA: Insufficient documentation

## 2019-03-31 DIAGNOSIS — Z806 Family history of leukemia: Secondary | ICD-10-CM | POA: Insufficient documentation

## 2019-03-31 DIAGNOSIS — R296 Repeated falls: Secondary | ICD-10-CM | POA: Diagnosis not present

## 2019-03-31 DIAGNOSIS — R630 Anorexia: Secondary | ICD-10-CM | POA: Diagnosis not present

## 2019-03-31 DIAGNOSIS — R2689 Other abnormalities of gait and mobility: Secondary | ICD-10-CM | POA: Insufficient documentation

## 2019-03-31 DIAGNOSIS — Z79899 Other long term (current) drug therapy: Secondary | ICD-10-CM | POA: Insufficient documentation

## 2019-03-31 DIAGNOSIS — Z923 Personal history of irradiation: Secondary | ICD-10-CM | POA: Diagnosis not present

## 2019-03-31 DIAGNOSIS — R634 Abnormal weight loss: Secondary | ICD-10-CM | POA: Insufficient documentation

## 2019-03-31 DIAGNOSIS — R5381 Other malaise: Secondary | ICD-10-CM | POA: Insufficient documentation

## 2019-03-31 DIAGNOSIS — C7931 Secondary malignant neoplasm of brain: Secondary | ICD-10-CM | POA: Insufficient documentation

## 2019-03-31 DIAGNOSIS — K219 Gastro-esophageal reflux disease without esophagitis: Secondary | ICD-10-CM | POA: Insufficient documentation

## 2019-03-31 DIAGNOSIS — R5383 Other fatigue: Secondary | ICD-10-CM | POA: Diagnosis not present

## 2019-03-31 DIAGNOSIS — R911 Solitary pulmonary nodule: Secondary | ICD-10-CM | POA: Insufficient documentation

## 2019-03-31 LAB — COMPREHENSIVE METABOLIC PANEL
ALT: 335 U/L — ABNORMAL HIGH (ref 0–44)
AST: 236 U/L — ABNORMAL HIGH (ref 15–41)
Albumin: 3.5 g/dL (ref 3.5–5.0)
Alkaline Phosphatase: 231 U/L — ABNORMAL HIGH (ref 38–126)
Anion gap: 11 (ref 5–15)
BUN: 24 mg/dL — ABNORMAL HIGH (ref 8–23)
CO2: 25 mmol/L (ref 22–32)
Calcium: 8.8 mg/dL — ABNORMAL LOW (ref 8.9–10.3)
Chloride: 99 mmol/L (ref 98–111)
Creatinine, Ser: 0.84 mg/dL (ref 0.61–1.24)
GFR calc Af Amer: >60 mL/min (ref >60)
GFR calc non Af Amer: >60 mL/min (ref >60)
Glucose, Bld: 164 mg/dL — ABNORMAL HIGH (ref 70–99)
Potassium: 3.7 mmol/L (ref 3.5–5.1)
Sodium: 135 mmol/L (ref 135–145)
Total Bilirubin: 1.6 mg/dL — ABNORMAL HIGH (ref 0.3–1.2)
Total Protein: 6.9 g/dL (ref 6.5–8.1)

## 2019-03-31 LAB — CBC WITH DIFFERENTIAL/PLATELET
Abs Immature Granulocytes: 0.05 10*3/uL (ref 0.00–0.07)
Basophils Absolute: 0 10*3/uL (ref 0.0–0.1)
Basophils Relative: 0 %
Eosinophils Absolute: 0 10*3/uL (ref 0.0–0.5)
Eosinophils Relative: 0 %
HCT: 38.2 % — ABNORMAL LOW (ref 39.0–52.0)
Hemoglobin: 12.9 g/dL — ABNORMAL LOW (ref 13.0–17.0)
Immature Granulocytes: 1 %
Lymphocytes Relative: 7 %
Lymphs Abs: 0.4 10*3/uL — ABNORMAL LOW (ref 0.7–4.0)
MCH: 32 pg (ref 26.0–34.0)
MCHC: 33.8 g/dL (ref 30.0–36.0)
MCV: 94.8 fL (ref 80.0–100.0)
Monocytes Absolute: 0.6 10*3/uL (ref 0.1–1.0)
Monocytes Relative: 10 %
Neutro Abs: 5 10*3/uL (ref 1.7–7.7)
Neutrophils Relative %: 82 %
Platelets: 186 10*3/uL (ref 150–400)
RBC: 4.03 MIL/uL — ABNORMAL LOW (ref 4.22–5.81)
RDW: 13.2 % (ref 11.5–15.5)
WBC: 6.1 10*3/uL (ref 4.0–10.5)
nRBC: 0 % (ref 0.0–0.2)

## 2019-03-31 LAB — PSA: Prostatic Specific Antigen: 364 ng/mL — ABNORMAL HIGH (ref 0.00–4.00)

## 2019-03-31 NOTE — Patient Instructions (Addendum)
#   half a pill every other day for 1 week and stop  # Take nexium twice a day; 30- 6 mins prior to meals.   # recommend Miralax every day/ dulcolax suppository for constipation

## 2019-03-31 NOTE — Assessment & Plan Note (Addendum)
#   Castrate resistant prostate cancer-stage IV; on Trelstar q 75M [09/27/2018]; May 28 PET- Progression; clinically progressing/worsening with brain metastasis/elevated LFTs [see below]; PSA- 148; rising s/p WBRT [finished 08/06]';  DECLINES any further treatments.  #Recommend hospice-given his declining performance status/symptoms/rising LFTs [see below]-see below  # Abdominal distention/Elevated LFTs-reviewed the medication with the patient's wife; no offending medications noted.  I suspect clinically worrisome for progressive disease in the liver.  Discussed regarding imaging; unfortunately this would not change the management management//hospice referral at this time.  #Reflux likely secondary to gastritis from steroids; recommend taper off 2 mg every other day for 1 week and stop.  Recommend Nexium twice a day  # Multiple falls-  Brain metastasis/causing weakness-Taper off steroids; hospice  # Constipation-worsened recommend MiraLAX Dulcolax suppository.  # Bone mets/pain second malignancy; stable.  Discontinue Xgeva given the clinical decline.  # Goals of care: Patient/wife is interested in hospice; will make referral.  Discussed with Josh.  # DISPOSITION: # HOLD x-geva # hospice referal ASAP # follow up as needed- Dr.B

## 2019-03-31 NOTE — Telephone Encounter (Signed)
Brooke please have Dr. B sign orders and fax to [facility]

## 2019-03-31 NOTE — Telephone Encounter (Signed)
I called and spoke to patient's wife [name] regarding patient's grim prognosis.  Given the elevated LFTs I suspect liver involvement with metastatic cancer.  Recommend calling home visiting palliative care nurse regarding change in patient's status/hospice referral.  Josh please make sure patient gets a visit from the hospice ASAP

## 2019-03-31 NOTE — Progress Notes (Signed)
[facility]  [phone] [phone]   Name: William Wright Date: 03/31/2019 MRN: [id number]  DOB: 1950/12/29  Patient Care Team: Cammie Sickle, MD as PCP - General (Internal Medicine) Royston Cowper, MD (Urology)    REASON FOR CONSULTATION: Palliative Care consult requested for this 68 y.o. 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  Patient has had disease progression on previous lines of treatment.  He is being rotated to [place] with palliative intent.  Patient was referred to palliative care to help address goals and manage ongoing symptoms.   SOCIAL HISTORY:     reports that he has never smoked. He has never used smokeless tobacco. He reports that he does not drink alcohol or use drugs.   Patient is married and lives at home with his wife of 40+ years.  His son also lives in the house.  Patient has a daughter who lives nearby.  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  He has a Oceanographer in counseling from [employer].  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  ADVANCE DIRECTIVES:  Wife is his financial and healthcare power of attorney  CODE STATUS: DNR (MOST form completed on 03/31/19)  PAST MEDICAL HISTORY: Past Medical History:  Diagnosis Date   Cancer of prostate ([place]) 07/2012   OraL chemo pill.    Family history of leukemia    Unstable gait     PAST SURGICAL HISTORY:  Past Surgical History:  Procedure Laterality Date   EXTRACORPOREAL SHOCK WAVE LITHOTRIPSY Left 05/20/2015   Procedure: EXTRACORPOREAL SHOCK WAVE LITHOTRIPSY (ESWL);  Surgeon: Royston Cowper, MD;  Location: [facility] ORS;  Service: Urology;   Laterality: Left;   EXTRACORPOREAL SHOCK WAVE LITHOTRIPSY Right 12/21/2016   Procedure: EXTRACORPOREAL SHOCK WAVE LITHOTRIPSY (ESWL);  Surgeon: Royston Cowper, MD;  Location: [facility] ORS;  Service: Urology;  Laterality: Right;   IR RADIOLOGIST EVAL & MGMT  02/28/2018    HEMATOLOGY/ONCOLOGY HISTORY:  Oncology History Overview Note  # FEB 2014- Prostate cancer Stage II [T2N0]; PA-18; [Dr.Wolfe]  # May 2015-STAGE IV;  PSA 90 on ADT; CT/Bone scan-extensive mets;  casodex+ Trelstar  # June 2016- Castration resistant prostate cancer/[facility] protocol- ZYTIGA plus minus XTANDI; June 28th-CT-C/A/P- Stable Retrocrural/RP/Pelvic LN;Bone scan- Stable sclerotic lesions.   # MARCH 2019- Taken off trial [ based on interm analysis]; continue X-tandi [given not needing steroids]  # July 2019- CT/bone scan stable; MRI lumbar spine- epidural extension/L3 spinal nerves involvement; on RT [finish sep 12th];  #May 28 PET scan-progressive disease; patient continues Xtandi/reluctant with other options.  #July 10th-  2020-brain metastases; [facility] neurosurgery evaluation; stop Xtandi.  S July 17 seizure; July 20-th whole brain radiation  # vertebroplasty [[facility]] on sep 13th 2019.  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/Dr.Fitzgerald- on diflucan  # Xgeva  # MOLECULAR TESTING/omniseq- BRCA-2 MUTATED; PDL-1- NEG; MSS; N-TRK-NEG  # GENETICS- BRCA-NEG; hetrozygous MUTHY; recommended family work up --------------------------------------------------    DIAGNOSIS: _0  Metastatic prostate cancer  STAGE: 4    ;GOALS: Palliative  CURRENT/MOST RECENT THERAPY-X-tandi+ Trelstar    [place]cer of prostate ([place]  08/17/2012 Initial Diagnosis   Cancer of prostate, stage II   12/03/2013 Progression  10/13/2014 Progression     Prostate cancer metastatic to multiple sites [facility])    ALLERGIES:  is allergic to no known allergies.  MEDICATIONS:  Current Outpatient Medications  Medication Sig Dispense Refill     dexamethasone (DECADRON) 4 MG tablet Take 1 tablet (4 mg total) by mouth 2 (two) times daily. 60 tablet 0   gabapentin (NEURONTIN) 100 MG capsule Take 1 capsule (100 mg total) by mouth 3 (three) times daily. 90 capsule 3   levETIRAcetam (KEPPRA) 500 MG tablet Take 1 tablet (500 mg total) by mouth 2 (two) times daily. (Patient not taking: Reported on 03/28/2019) 60 tablet 3   ondansetron (ZOFRAN) 8 MG tablet One pill 30 mins prior taking rubraca to prevent nausea/ vomitting. 60 tablet 3   oxybutynin (DITROPAN-XL) 5 MG 24 hr tablet Take 1 tablet (5 mg total) by mouth at bedtime. 90 tablet 3   rucaparib camsylate (RUBRACA) 300 MG tablet Take 2 tablets (600 mg total) by mouth 2 (two) times daily. 120 tablet 3   tamsulosin (FLOMAX) 0.4 MG CAPS capsule TAKE 1 CAPSULE BY MOUTH DAILY (Patient taking differently: Take 0.4 mg by mouth at bedtime. ) 90 capsule 3   Triptorelin Pamoate (TRELSTAR) 22.5 MG injection Inject 22.5 mg into the muscle every 6 (six) months.     valACYclovir (VALTREX) 1000 MG tablet Take 1 tablet (1,000 mg total) by mouth 2 (two) times daily. (Patient taking differently: Take 1,000 mg by mouth 2 (two) times daily as needed (fever blister). ) 60 tablet 6   No current facility-administered medications for this visit.     VITAL SIGNS: There were no vitals taken for this visit. There were no vitals filed for this visit.  Estimated body mass index is 21.02 kg/m as calculated from the following:   Height as of an earlier encounter on 03/31/19: 6' (1.829 m).   Weight as of an earlier encounter on 03/31/19: 155 lb (70.3 kg).  LABS: CBC:    Component Value Date/Time   WBC 6.1 03/31/2019 0948   HGB 12.9 (L) 03/31/2019 0948   HGB 13.0 11/12/2014 1415   HCT 38.2 (L) 03/31/2019 0948   HCT 37.7 (L) 11/12/2014 1415   PLT 186 03/31/2019 0948   PLT 224 11/12/2014 1415   MCV 94.8 03/31/2019 0948   MCV 91 11/12/2014 1415   NEUTROABS 5.0 03/31/2019 0948   NEUTROABS 3.5 11/12/2014  1415   LYMPHSABS 0.4 (L) 03/31/2019 0948   LYMPHSABS 1.3 11/12/2014 1415   MONOABS 0.6 03/31/2019 0948   MONOABS 0.5 11/12/2014 1415   EOSABS 0.0 03/31/2019 0948   EOSABS 0.3 11/12/2014 1415   BASOSABS 0.0 03/31/2019 0948   BASOSABS 0.1 11/12/2014 1415   Comprehensive Metabolic Panel:    Component Value Date/Time   NA 135 03/31/2019 0948   NA 137 11/12/2014 1415   K 3.7 03/31/2019 0948   K 4.1 11/12/2014 1415   CL 99 03/31/2019 0948   CL 105 11/12/2014 1415   CO2 25 03/31/2019 0948   CO2 24 11/12/2014 1415   BUN 24 (H) 03/31/2019 0948   BUN 24 (H) 11/12/2014 1415   CREATININE 0.84 03/31/2019 0948   CREATININE 1.02 11/12/2014 1415   GLUCOSE 164 (H) 03/31/2019 0948   GLUCOSE 98 11/12/2014 1415   CALCIUM 8.8 (L) 03/31/2019 0948   CALCIUM 9.2 11/12/2014 1415   AST 236 (H) 03/31/2019 0948   AST 23 11/12/2014 1415   ALT 335 (H) 03/31/2019 0948   ALT 16 (L) 11/12/2014 1415  ALKPHOS 231 (H) 03/31/2019 0948   ALKPHOS 103 11/12/2014 1415   BILITOT 1.6 (H) 03/31/2019 0948   BILITOT 0.4 11/12/2014 1415   PROT 6.9 03/31/2019 0948   PROT 7.4 11/12/2014 1415   ALBUMIN 3.5 03/31/2019 0948   ALBUMIN 4.4 11/12/2014 1415    RADIOGRAPHIC STUDIES: No results found.  PERFORMANCE STATUS (ECOG) : 3 - Symptomatic, >50% confined to bed  Review of Systems Unless otherwise noted, a complete review of systems is negative.  Physical Exam General: NAD, frail appearing, thin, in wheelchair Pulmonary: Unlabored Abdomen: Distended, tympanic, nontender Extremities: no edema, no joint deformities Skin: no rashes Neurological: Weakness but otherwise nonfocal  IMPRESSION: Met with patient for routine follow-up.    Patient appears to have declined over the last several weeks.  He is much frailer despite continuing to receive home health.  He is having more difficulty with fine dexterity.  He is also increasingly symptomatic with nausea and reflux.  Patient is being started on twice daily  Nexium by Dr. [name].  Would also recommend addition of Senokot for constipation.  Given decline, patient has not felt to be a candidate for further treatment nor is patient interested.  Patient is in agreement with pursuing hospice and a referral is being made today.  I discussed CODE STATUS.  Patient is not interested in resuscitation or life prolonging measures.   I completed a MOST form today. The patient and family outlined their wishes for the following treatment decisions:  Cardiopulmonary Resuscitation: Do Not Attempt Resuscitation (DNR/No CPR)  Medical Interventions: Limited Additional Interventions: Use medical treatment, IV fluids and cardiac monitoring as indicated, DO NOT USE intubation or mechanical ventilation. May consider use of less invasive airway support such as BiPAP or CPAP. Also provide comfort measures. Transfer to the hospital if indicated. Avoid intensive care.   Antibiotics: Antibiotics if indicated  IV Fluids: IV fluids for a defined trial period  Feeding Tube: No feeding tube   I transported patient to his car. Discussed case and plan with wife.   PLAN: -Best supportive care -MOST form completed/DNR -Add senna daily prn -Referral to hospice -RTC as needed   Patient expressed understanding and was in agreement with this plan. He also understands that He can call the clinic at any time with any questions, concerns, or complaints.     Time Total: 20 minutes  Visit consisted of counseling and education dealing with the complex and emotionally intense issues of symptom management and palliative care in the setting of serious and potentially life-threatening illness.Greater than 50%  of this time was spent counseling and coordinating care related to the above assessment and plan.  Signed by: Altha Harm, PhD, NP-C [phone] (Work Cell)

## 2019-03-31 NOTE — Telephone Encounter (Signed)
Patient was ordered a rolling walker with a seat and he cannot use that, wife requests a regular walker be ordered as well as a BSC. Please return her call [phone]

## 2019-03-31 NOTE — Telephone Encounter (Signed)
Orders have been faxed

## 2019-03-31 NOTE — Telephone Encounter (Signed)
Ok will send new DME.

## 2019-03-31 NOTE — Progress Notes (Signed)
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   # MARCH 2019- Taken off trial [ based on interm analysis]; continue X-tandi [given not needing steroids]  # July 2019- CT/bone scan stable; MRI lumbar spine- epidural extension/L3 spinal nerves involvement; on RT [finish sep 12th];  #May 28 PET scan-progressive disease; patient continues Xtandi/reluctant with other options.  #July 10th-  2020-brain metastases; [facility] neurosurgery evaluation; stop Xtandi.  S July 17 seizure; July 20-th whole brain radiation  # vertebroplasty [[facility]] on sep 13th 2019.  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/p Bx- Cryptococcus- ID/Dr.Fitzgerald- on diflucan  # Xgeva  # MOLECULAR TESTING/omniseq- BRCA-2 MUTATED; PDL-1- NEG; MSS; N-TRK-NEG  # GENETICS- BRCA-NEG; hetrozygous MUTHY; recommended family work up --------------------------------------------------    DIAGNOSIS: _0  Metastatic prostate cancer  STAGE: 4    ;GOALS: Palliative  CURRENT/MOST RECENT THERAPY-X-tandi+ Trelstar    [place]  08[place] Initial Diagnosis   Cancer of prostate, stage II   12/03/2013 Progression     10/13/2014 Progression     Prostate cancer metastatic to multiple sites ([facility]      INTERVAL HISTORY:  William MIHALIK89y.o.  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 Patient currently finished whole brain radiation on August 6th.   Patient declined further treatment options-given his concerns of quality of life/drug toxicity.  He brings back the bottle of Rubraca.  Patient is currently taking 2 mg of dexamethasone every other day.  He denies any pain in his legs or hips.  Patient complains of abdominal distention; extreme poor appetite.  Possible weight loss.  Positive for nausea.  Denies any difficulty swallowing.  Constipation-small bowel movements every other day.  Review of Systems  Constitutional: Positive for malaise/fatigue and weight loss. Negative for chills, diaphoresis and fever.  HENT: Negative for nosebleeds and sore throat.   Eyes: Negative for double vision.  Respiratory: Negative for cough, hemoptysis, sputum production, shortness of breath and wheezing.   Cardiovascular: Negative for chest pain, palpitations, orthopnea and leg swelling.  Gastrointestinal: Positive for abdominal pain, constipation and heartburn. Negative for blood in stool, diarrhea, melena, nausea and vomiting.  Genitourinary: Negative for dysuria, frequency and urgency.  Musculoskeletal: Positive for back pain and joint pain.  Skin: Negative.  Negative for itching and rash.  Neurological: Positive for focal weakness. Negative for dizziness, weakness and headaches.  Endo/Heme/Allergies: Does not bruise/bleed easily.  Psychiatric/Behavioral: Negative for depression. The patient does not have insomnia.       PAST MEDICAL HISTORY :  Past Medical History:  Diagnosis Date  . [place]   OraL chemo pill.   . Family history of leukemia   . Unstable gait     PAST SURGICAL HISTORY :   Past Surgical History:  Procedure Laterality Date  . EXTRACORPOREAL SHOCK WAVE LITHOTRIPSY  Left 05/20/2015   Procedure: EXTRACORPOREAL SHOCK WAVE LITHOTRIPSY (ESWL);  Surgeon: Royston Cowper, MD;  Location: [facility] ORS;  Service: Urology;  Laterality: Left;  . EXTRACORPOREAL SHOCK WAVE LITHOTRIPSY Right 12/21/2016   Procedure: EXTRACORPOREAL SHOCK WAVE LITHOTRIPSY (ESWL);  Surgeon: Royston Cowper, MD;  Location: [facility] ORS;  Service: Urology;  Laterality: Right;  . IR RADIOLOGIST EVAL & MGMT  02/28/2018    FAMILY HISTORY :   Family History  Problem Relation Age of Onset  . Alzheimer's disease Father   . Lung cancer Maternal Grandfather 62  . Leukemia Paternal Grandmother 36  . COPD Paternal Grandfather   . Cancer Maternal Uncle        type unk, dx 80's    SOCIAL HISTORY:   Social History   Tobacco Use  . Smoking status: Never Smoker  . Smokeless tobacco: Never Used  Substance Use Topics  . Alcohol use: No  . Drug use: No    ALLERGIES:  is allergic to no known allergies.  MEDICATIONS:  Current Outpatient Medications  Medication Sig Dispense Refill  . dexamethasone (DECADRON) 4 MG tablet Take 1 tablet (4 mg total) by mouth 2 (two) times daily. 60 tablet 0  . gabapentin (NEURONTIN) 100 MG capsule Take 1 capsule (100 mg total) by mouth 3 (three) times daily. 90 capsule 3  . ondansetron (ZOFRAN) 8 MG tablet One pill 30 mins prior taking rubraca to prevent nausea/ vomitting. 60 tablet 3  . oxybutynin (DITROPAN-XL) 5 MG 24 hr tablet Take 1 tablet (5 mg total) by mouth at bedtime. 90 tablet 3  . rucaparib camsylate (RUBRACA) 300 MG tablet Take 2 tablets (600 mg total) by mouth 2 (two) times daily. 120 tablet 3  . tamsulosin (FLOMAX) 0.4 MG CAPS capsule TAKE 1 CAPSULE BY MOUTH DAILY (Patient taking differently: Take 0.4 mg by mouth at bedtime. ) 90 capsule 3  . Triptorelin Pamoate (TRELSTAR) 22.5 MG injection Inject 22.5 mg into the muscle every 6 (six) months.    . valACYclovir (VALTREX) 1000 MG tablet Take 1 tablet (1,000 mg total) by mouth 2 (two) times daily. (Patient taking  differently: Take 1,000 mg by mouth 2 (two) times daily as needed (fever blister). ) 60 tablet 6  . levETIRAcetam (KEPPRA) 500 MG tablet Take 1 tablet (500 mg total) by mouth 2 (two) times daily. (Patient not taking: Reported on 03/28/2019) 60 tablet 3   No current facility-administered medications for this visit.     PHYSICAL EXAMINATION: ECOG PERFORMANCE STATUS: 1 - Symptomatic but completely ambulatory  BP 116/77   Pulse 98   Temp 97.7 F (36.5 C) (Tympanic)   Resp 18   Ht 6' (1.829 m)   Wt 155 lb (70.3 kg)   BMI 21.02 kg/m   Filed Weights   03/31/19 1014  Weight: 155 lb (70.3 kg)    Physical Exam  Constitutional: He is oriented to person, place, and time and well-developed, well-nourished, and in no distress.  Alone.  Walking by himself.  HENT:  Head: Normocephalic and atraumatic.  Mouth/Throat: Oropharynx is clear and moist. No oropharyngeal exudate.  Eyes: Pupils are equal, round, and reactive to light.  Neck: Normal range of motion. Neck supple.  Cardiovascular: Normal rate and regular rhythm.  Pulmonary/Chest: No respiratory distress. He has no wheezes.  Decreased air entry bilaterally lower bases.  Abdominal: Soft. Bowel sounds are normal. He exhibits distension. He exhibits no mass. There is no abdominal  tenderness. There is no rebound and no guarding.  Musculoskeletal: Normal range of motion.        General: No tenderness or edema.  Neurological: He is alert and oriented to person, place, and time.  4 out of 5 weakness noted in the left upper extremity/left lower extremity.  Patient in wheelchair.  Gait not tested  Skin: Skin is warm.  Psychiatric: Affect normal.    LABORATORY DATA:  I have reviewed the data as listed    Component Value Date/Time   NA 135 03/31/2019 0948   NA 137 11/12/2014 1415   K 3.7 03/31/2019 0948   K 4.1 11/12/2014 1415   CL 99 03/31/2019 0948   CL 105 11/12/2014 1415   CO2 25 03/31/2019 0948   CO2 24 11/12/2014 1415   GLUCOSE  164 (H) 03/31/2019 0948   GLUCOSE 98 11/12/2014 1415   BUN 24 (H) 03/31/2019 0948   BUN 24 (H) 11/12/2014 1415   CREATININE 0.84 03/31/2019 0948   CREATININE 1.02 11/12/2014 1415   CALCIUM 8.8 (L) 03/31/2019 0948   CALCIUM 9.2 11/12/2014 1415   PROT 6.9 03/31/2019 0948   PROT 7.4 11/12/2014 1415   ALBUMIN 3.5 03/31/2019 0948   ALBUMIN 4.4 11/12/2014 1415   AST 236 (H) 03/31/2019 0948   AST 23 11/12/2014 1415   ALT 335 (H) 03/31/2019 0948   ALT 16 (L) 11/12/2014 1415   ALKPHOS 231 (H) 03/31/2019 0948   ALKPHOS 103 11/12/2014 1415   BILITOT 1.6 (H) 03/31/2019 0948   BILITOT 0.4 11/12/2014 1415   GFRNONAA >60 03/31/2019 0948   GFRNONAA >60 11/12/2014 1415   GFRAA >60 03/31/2019 0948   GFRAA >60 11/12/2014 1415    No results found for: SPEP, UPEP  Lab Results  Component Value Date   WBC 6.1 03/31/2019   NEUTROABS 5.0 03/31/2019   HGB 12.9 (L) 03/31/2019   HCT 38.2 (L) 03/31/2019   MCV 94.8 03/31/2019   PLT 186 03/31/2019      Chemistry      Component Value Date/Time   NA 135 03/31/2019 0948   NA 137 11/12/2014 1415   K 3.7 03/31/2019 0948   K 4.1 11/12/2014 1415   CL 99 03/31/2019 0948   CL 105 11/12/2014 1415   CO2 25 03/31/2019 0948   CO2 24 11/12/2014 1415   BUN 24 (H) 03/31/2019 0948   BUN 24 (H) 11/12/2014 1415   CREATININE 0.84 03/31/2019 0948   CREATININE 1.02 11/12/2014 1415      Component Value Date/Time   CALCIUM 8.8 (L) 03/31/2019 0948   CALCIUM 9.2 11/12/2014 1415   ALKPHOS 231 (H) 03/31/2019 0948   ALKPHOS 103 11/12/2014 1415   AST 236 (H) 03/31/2019 0948   AST 23 11/12/2014 1415   ALT 335 (H) 03/31/2019 0948   ALT 16 (L) 11/12/2014 1415   BILITOT 1.6 (H) 03/31/2019 0948   BILITOT 0.4 11/12/2014 1415       RADIOGRAPHIC STUDIES: I have personally reviewed the radiological images as listed and agreed with the findings in the report. No results found.   ASSESSMENT & PLAN:  Prostate cancer metastatic to multiple sites [facility]) # Castrate  resistant prostate cancer-stage IV; on Trelstar q 82M [09/27/2018]; May 28 PET- Progression; clinically progressing/worsening with brain metastasis/elevated LFTs [see below]; PSA- 148; rising s/p WBRT [finished 08/06]';  DECLINES any further treatments.  #Recommend hospice-given his declining performance status/symptoms/rising LFTs [see below]-see below  # Abdominal distention/Elevated LFTs-reviewed the medication with the patient's wife; no  offending medications noted.  I suspect clinically worrisome for progressive disease in the liver.  Discussed regarding imaging; unfortunately this would not change the management management//hospice referral at this time.  #Reflux likely secondary to gastritis from steroids; recommend taper off 2 mg every other day for 1 week and stop.  Recommend Nexium twice a day  # Multiple falls-  Brain metastasis/causing weakness-Taper off steroids; hospice  # Constipation-worsened recommend MiraLAX Dulcolax suppository.  # Bone mets/pain second malignancy; stable.  Discontinue Xgeva given the clinical decline.  # Goals of care: Patient/wife is interested in hospice; will make referral.  Discussed with Josh.  # DISPOSITION: # HOLD x-geva # hospice referal ASAP # follow up as needed- Dr.B     No orders of the defined types were placed in this encounter.  All questions were answered. The patient knows to call the clinic with any problems, questions or concerns.      Cammie Sickle, MD 03/31/2019 1:01 PM

## 2019-04-01 NOTE — Telephone Encounter (Signed)
I called and spoke with hospice. Orders given and asked that they prioritize hospice admission. Hopefully, they should be able to admit to services today.

## 2019-04-06 DIAGNOSIS — R740 Nonspecific elevation of levels of transaminase and lactic acid dehydrogenase [LDH]: Secondary | ICD-10-CM | POA: Diagnosis not present

## 2019-04-06 DIAGNOSIS — I712 Thoracic aortic aneurysm, without rupture: Secondary | ICD-10-CM | POA: Diagnosis not present

## 2019-04-06 DIAGNOSIS — R180 Malignant ascites: Secondary | ICD-10-CM | POA: Diagnosis not present

## 2019-04-06 DIAGNOSIS — C7931 Secondary malignant neoplasm of brain: Secondary | ICD-10-CM | POA: Diagnosis not present

## 2019-04-06 DIAGNOSIS — C7951 Secondary malignant neoplasm of bone: Secondary | ICD-10-CM | POA: Diagnosis not present

## 2019-04-06 DIAGNOSIS — G40909 Epilepsy, unspecified, not intractable, without status epilepticus: Secondary | ICD-10-CM | POA: Diagnosis not present

## 2019-04-06 DIAGNOSIS — C61 Malignant neoplasm of prostate: Secondary | ICD-10-CM | POA: Diagnosis not present

## 2019-04-06 DIAGNOSIS — Z8709 Personal history of other diseases of the respiratory system: Secondary | ICD-10-CM | POA: Diagnosis not present

## 2019-04-07 ENCOUNTER — Telehealth: Payer: Self-pay | Admitting: *Deleted

## 2019-04-07 DIAGNOSIS — C61 Malignant neoplasm of prostate: Secondary | ICD-10-CM | POA: Diagnosis not present

## 2019-04-07 DIAGNOSIS — C7931 Secondary malignant neoplasm of brain: Secondary | ICD-10-CM | POA: Diagnosis not present

## 2019-04-07 DIAGNOSIS — C7951 Secondary malignant neoplasm of bone: Secondary | ICD-10-CM | POA: Diagnosis not present

## 2019-04-07 DIAGNOSIS — G40909 Epilepsy, unspecified, not intractable, without status epilepticus: Secondary | ICD-10-CM | POA: Diagnosis not present

## 2019-04-07 DIAGNOSIS — R740 Nonspecific elevation of levels of transaminase and lactic acid dehydrogenase [LDH]: Secondary | ICD-10-CM | POA: Diagnosis not present

## 2019-04-07 DIAGNOSIS — R180 Malignant ascites: Secondary | ICD-10-CM | POA: Diagnosis not present

## 2019-04-07 NOTE — Telephone Encounter (Signed)
Hospice called to report that patient wife called them after refusing services and asked for them to come back out. Patient was opened to services yesterday 04/06/19. Disregard the not opened to services notification that was sent

## 2019-04-08 ENCOUNTER — Other Ambulatory Visit: Payer: Self-pay | Admitting: Hospice and Palliative Medicine

## 2019-04-08 ENCOUNTER — Other Ambulatory Visit: Payer: Self-pay | Admitting: *Deleted

## 2019-04-08 DIAGNOSIS — R180 Malignant ascites: Secondary | ICD-10-CM | POA: Diagnosis not present

## 2019-04-08 DIAGNOSIS — C61 Malignant neoplasm of prostate: Secondary | ICD-10-CM | POA: Diagnosis not present

## 2019-04-08 DIAGNOSIS — R740 Nonspecific elevation of levels of transaminase and lactic acid dehydrogenase [LDH]: Secondary | ICD-10-CM | POA: Diagnosis not present

## 2019-04-08 DIAGNOSIS — G40909 Epilepsy, unspecified, not intractable, without status epilepticus: Secondary | ICD-10-CM | POA: Diagnosis not present

## 2019-04-08 DIAGNOSIS — R109 Unspecified abdominal pain: Secondary | ICD-10-CM

## 2019-04-08 DIAGNOSIS — C7951 Secondary malignant neoplasm of bone: Secondary | ICD-10-CM | POA: Diagnosis not present

## 2019-04-08 DIAGNOSIS — C7931 Secondary malignant neoplasm of brain: Secondary | ICD-10-CM | POA: Diagnosis not present

## 2019-04-08 MED ORDER — AMOXICILLIN-POT CLAVULANATE 875-125 MG PO TABS: 1.0000 | ORAL_TABLET | Freq: Two times a day (BID) | ORAL | 0 refills | Status: AC

## 2019-04-08 MED ORDER — OXYCODONE HCL 5 MG PO CAPS: 5.0000 mg | ORAL_CAPSULE | ORAL | 0 refills | Status: AC | PRN

## 2019-04-08 NOTE — Telephone Encounter (Signed)
William Wright, pls. Sch. U/s limited and inform scheduling with the goal of possible paracentesis.

## 2019-04-08 NOTE — Progress Notes (Signed)
Spoke with hospice nurse. She thinks that there might be a pus pocket in the gum. It is difficult for patient to leave the house for medical appointments. Will empirically start Augmentin 875mg  BID x 10 days. Would consider dental or oral surgery referral vs ER if symptoms worsening or do not improve with treatment. However, in hospice patient with goal of comfort, would try to keep him home if possible.   Case and plan discussed with Dr. [name].

## 2019-04-08 NOTE — Telephone Encounter (Signed)
I spoke with hospice nurse by phone.  She was told that patient has redness and swelling along the jawline that is extremely tender to touch.  I question if he has parotitis.  I asked nurse to make a visit and perform an oral exam and ensure that he does not have a any evidence of dental abscess.  Would likely proceed with empiric antibiotics given plan for comfort care.  For pain, will start oxycodone 5 mg every 4 hours as needed (#45).  We will order abdominal ultrasound to evaluate presence of ascites and proceed with paracentesis if needed.

## 2019-04-08 NOTE — Telephone Encounter (Signed)
Devana from hospice called reporting that patient has swelling at his facial neck area (possible lymph node) which is very painful for the patient and he has no pain medications ordered. She requests pain medicine for this patient. Also asking if we would consider a paracentesis for him. Please advise

## 2019-04-09 DIAGNOSIS — C7951 Secondary malignant neoplasm of bone: Secondary | ICD-10-CM | POA: Diagnosis not present

## 2019-04-09 DIAGNOSIS — C61 Malignant neoplasm of prostate: Secondary | ICD-10-CM | POA: Diagnosis not present

## 2019-04-09 DIAGNOSIS — C7931 Secondary malignant neoplasm of brain: Secondary | ICD-10-CM | POA: Diagnosis not present

## 2019-04-09 DIAGNOSIS — G40909 Epilepsy, unspecified, not intractable, without status epilepticus: Secondary | ICD-10-CM | POA: Diagnosis not present

## 2019-04-09 DIAGNOSIS — R740 Nonspecific elevation of levels of transaminase and lactic acid dehydrogenase [LDH]: Secondary | ICD-10-CM | POA: Diagnosis not present

## 2019-04-09 DIAGNOSIS — R180 Malignant ascites: Secondary | ICD-10-CM | POA: Diagnosis not present

## 2019-04-09 MED ORDER — MORPHINE SULFATE (CONCENTRATE) 20 MG/ML PO SOLN: 5.0000 mg | ORAL | 0 refills | Status: AC | PRN

## 2019-04-09 NOTE — Telephone Encounter (Signed)
Thanks [employer]. I spoke with Devana with hospice. She is on the way to the home to make a visit now.  We discussed the option of residential hospice to alleviate caregiver burden.  She will explore options with family.  She will call me if any orders are needed to ensure that patient is kept comfortable.

## 2019-04-09 NOTE — Addendum Note (Signed)
Addended by: Sabino Gasser on: 04/09/2019 11:47 AM   Modules accepted: Orders

## 2019-04-09 NOTE — Telephone Encounter (Addendum)
Contacted pt's wife with apt for paracentesis and u/s of abd. Apt scheduled for Friday 04/11/19 at 8 am.  Wife uncertain patient will be able to keep this apt. She is tearful and expressed caregiver stress. She reports that pt has had a clinical decline. He is not breathing well. She personally has not slept due to concern about patient's breathing patterns. Wife reports that a 530 am today pt breathing became laborious and rattles were present. He is not tolerating any food. He can not drink fluids or even sit up in bed. Wife attempted to offer patient mashed up foods to attempt to get patient to take his antibiotics. She was able to offer patient a few sips of milk, but patient started vomiting up bile shortly thereafter. Wife wanted to attempt to push patient to drink protein drink today if pt is able to tolerate. Patient unable to talk and hold conversations in length. Pt's neck is distended and hard and warm to touch.   Wife expressed frustration in not being able to take care of her husband any longer due to fragility and decline. She is personally unable to provide total care that is needed. She is optimistic that a CNA friend may be able to provide some medical care or sit with the patient while she runs a few errands for one day in the next week.  I discussed end of life care in length and expectations and goals of care and respite care. Her questions were answered to her satisfaction.  Wife would like [facility] Wright (with hospice) to come out to reevaluate patient today instead of waiting until Friday's visit due to change in patient's condition and rapid decline. Wife hesitant to keep the paracentesis apt on Friday. Wife does not believe that patient will be able to be transported by herself for this apt. She was given my clinical hotline number for [facility] Wright team and after speaking to [facility] to follow-up with our team if she would like to cnl the para for Friday.  Given patient's condition, she   I  personally reached out to William Wright  [phone]) to discuss the above concerns. William Wright will come to pt's home today for assessment.  Patient does not yet have his comfort kit- as this is still pending [facility] Wright signature. She wanted to know if Dr. B would be willing to send a roxanol script to [facility] until patient can get his comfort kit ordered.  Care above discussed with William Wright. William Wright has been pended. Please sign roxanol script if agreeable.     Approximately 30-45 mins spent in therapeutic conversation with pt's wife and collaboration of care with hospice team.

## 2019-04-10 DIAGNOSIS — C7951 Secondary malignant neoplasm of bone: Secondary | ICD-10-CM | POA: Diagnosis not present

## 2019-04-10 DIAGNOSIS — R180 Malignant ascites: Secondary | ICD-10-CM | POA: Diagnosis not present

## 2019-04-10 DIAGNOSIS — C7931 Secondary malignant neoplasm of brain: Secondary | ICD-10-CM | POA: Diagnosis not present

## 2019-04-10 DIAGNOSIS — G40909 Epilepsy, unspecified, not intractable, without status epilepticus: Secondary | ICD-10-CM | POA: Diagnosis not present

## 2019-04-10 DIAGNOSIS — C61 Malignant neoplasm of prostate: Secondary | ICD-10-CM | POA: Diagnosis not present

## 2019-04-10 DIAGNOSIS — R740 Nonspecific elevation of levels of transaminase and lactic acid dehydrogenase [LDH]: Secondary | ICD-10-CM | POA: Diagnosis not present

## 2019-04-10 NOTE — Telephone Encounter (Signed)
I spoke with Wilburt Finlay, RN. She spoke patient's wife and was told that patient is doing better today. Symptoms are better controlled. She asked about anticholinergics for secretion management. Generally, those medications (e.g. scopolamine) have limited efficacy. Would prescribe Robinul tablets if something is needed as Robinul is not as prone to neurotoxicity. RN to monitor.

## 2019-04-11 ENCOUNTER — Ambulatory Visit: Payer: Medicare Other

## 2019-04-11 DIAGNOSIS — R740 Nonspecific elevation of levels of transaminase and lactic acid dehydrogenase [LDH]: Secondary | ICD-10-CM | POA: Diagnosis not present

## 2019-04-11 DIAGNOSIS — C7951 Secondary malignant neoplasm of bone: Secondary | ICD-10-CM | POA: Diagnosis not present

## 2019-04-11 DIAGNOSIS — R180 Malignant ascites: Secondary | ICD-10-CM | POA: Diagnosis not present

## 2019-04-11 DIAGNOSIS — C7931 Secondary malignant neoplasm of brain: Secondary | ICD-10-CM | POA: Diagnosis not present

## 2019-04-11 DIAGNOSIS — G40909 Epilepsy, unspecified, not intractable, without status epilepticus: Secondary | ICD-10-CM | POA: Diagnosis not present

## 2019-04-11 DIAGNOSIS — C61 Malignant neoplasm of prostate: Secondary | ICD-10-CM | POA: Diagnosis not present

## 2019-04-12 DIAGNOSIS — G40909 Epilepsy, unspecified, not intractable, without status epilepticus: Secondary | ICD-10-CM | POA: Diagnosis not present

## 2019-04-12 DIAGNOSIS — C61 Malignant neoplasm of prostate: Secondary | ICD-10-CM | POA: Diagnosis not present

## 2019-04-12 DIAGNOSIS — C7931 Secondary malignant neoplasm of brain: Secondary | ICD-10-CM | POA: Diagnosis not present

## 2019-04-12 DIAGNOSIS — R180 Malignant ascites: Secondary | ICD-10-CM | POA: Diagnosis not present

## 2019-04-12 DIAGNOSIS — R740 Nonspecific elevation of levels of transaminase and lactic acid dehydrogenase [LDH]: Secondary | ICD-10-CM | POA: Diagnosis not present

## 2019-04-12 DIAGNOSIS — C7951 Secondary malignant neoplasm of bone: Secondary | ICD-10-CM | POA: Diagnosis not present

## 2019-04-13 DIAGNOSIS — C7931 Secondary malignant neoplasm of brain: Secondary | ICD-10-CM | POA: Diagnosis not present

## 2019-04-13 DIAGNOSIS — R180 Malignant ascites: Secondary | ICD-10-CM | POA: Diagnosis not present

## 2019-04-13 DIAGNOSIS — C61 Malignant neoplasm of prostate: Secondary | ICD-10-CM | POA: Diagnosis not present

## 2019-04-13 DIAGNOSIS — R740 Nonspecific elevation of levels of transaminase and lactic acid dehydrogenase [LDH]: Secondary | ICD-10-CM | POA: Diagnosis not present

## 2019-04-13 DIAGNOSIS — C7951 Secondary malignant neoplasm of bone: Secondary | ICD-10-CM | POA: Diagnosis not present

## 2019-04-13 DIAGNOSIS — G40909 Epilepsy, unspecified, not intractable, without status epilepticus: Secondary | ICD-10-CM | POA: Diagnosis not present

## 2019-04-14 DIAGNOSIS — C7951 Secondary malignant neoplasm of bone: Secondary | ICD-10-CM | POA: Diagnosis not present

## 2019-04-14 DIAGNOSIS — R180 Malignant ascites: Secondary | ICD-10-CM | POA: Diagnosis not present

## 2019-04-14 DIAGNOSIS — R740 Nonspecific elevation of levels of transaminase and lactic acid dehydrogenase [LDH]: Secondary | ICD-10-CM | POA: Diagnosis not present

## 2019-04-14 DIAGNOSIS — G40909 Epilepsy, unspecified, not intractable, without status epilepticus: Secondary | ICD-10-CM | POA: Diagnosis not present

## 2019-04-14 DIAGNOSIS — C7931 Secondary malignant neoplasm of brain: Secondary | ICD-10-CM | POA: Diagnosis not present

## 2019-04-14 DIAGNOSIS — C61 Malignant neoplasm of prostate: Secondary | ICD-10-CM | POA: Diagnosis not present

## 2019-04-15 DIAGNOSIS — C7951 Secondary malignant neoplasm of bone: Secondary | ICD-10-CM | POA: Diagnosis not present

## 2019-04-15 DIAGNOSIS — C61 Malignant neoplasm of prostate: Secondary | ICD-10-CM | POA: Diagnosis not present

## 2019-04-15 DIAGNOSIS — C7931 Secondary malignant neoplasm of brain: Secondary | ICD-10-CM | POA: Diagnosis not present

## 2019-04-15 DIAGNOSIS — R180 Malignant ascites: Secondary | ICD-10-CM | POA: Diagnosis not present

## 2019-04-15 DIAGNOSIS — R740 Nonspecific elevation of levels of transaminase and lactic acid dehydrogenase [LDH]: Secondary | ICD-10-CM | POA: Diagnosis not present

## 2019-04-15 DIAGNOSIS — G40909 Epilepsy, unspecified, not intractable, without status epilepticus: Secondary | ICD-10-CM | POA: Diagnosis not present

## 2019-04-16 DIAGNOSIS — G40909 Epilepsy, unspecified, not intractable, without status epilepticus: Secondary | ICD-10-CM | POA: Diagnosis not present

## 2019-04-16 DIAGNOSIS — C61 Malignant neoplasm of prostate: Secondary | ICD-10-CM | POA: Diagnosis not present

## 2019-04-16 DIAGNOSIS — C7931 Secondary malignant neoplasm of brain: Secondary | ICD-10-CM | POA: Diagnosis not present

## 2019-04-16 DIAGNOSIS — C7951 Secondary malignant neoplasm of bone: Secondary | ICD-10-CM | POA: Diagnosis not present

## 2019-04-16 DIAGNOSIS — R180 Malignant ascites: Secondary | ICD-10-CM | POA: Diagnosis not present

## 2019-04-16 DIAGNOSIS — R740 Nonspecific elevation of levels of transaminase and lactic acid dehydrogenase [LDH]: Secondary | ICD-10-CM | POA: Diagnosis not present

## 2019-04-17 ENCOUNTER — Telehealth: Payer: Self-pay | Admitting: Hospice and Palliative Medicine

## 2019-04-17 DIAGNOSIS — C7951 Secondary malignant neoplasm of bone: Secondary | ICD-10-CM | POA: Diagnosis not present

## 2019-04-17 DIAGNOSIS — C61 Malignant neoplasm of prostate: Secondary | ICD-10-CM | POA: Diagnosis not present

## 2019-04-17 DIAGNOSIS — R7402 Elevation of levels of lactic acid dehydrogenase (LDH): Secondary | ICD-10-CM | POA: Diagnosis not present

## 2019-04-17 DIAGNOSIS — R180 Malignant ascites: Secondary | ICD-10-CM | POA: Diagnosis not present

## 2019-04-17 DIAGNOSIS — Z8709 Personal history of other diseases of the respiratory system: Secondary | ICD-10-CM | POA: Diagnosis not present

## 2019-04-17 DIAGNOSIS — G40909 Epilepsy, unspecified, not intractable, without status epilepticus: Secondary | ICD-10-CM | POA: Diagnosis not present

## 2019-04-17 DIAGNOSIS — C7931 Secondary malignant neoplasm of brain: Secondary | ICD-10-CM | POA: Diagnosis not present

## 2019-04-17 DIAGNOSIS — I712 Thoracic aortic aneurysm, without rupture: Secondary | ICD-10-CM | POA: Diagnosis not present

## 2019-04-17 NOTE — Telephone Encounter (Signed)
I received a call from hospice nurse.  Patient is declining and has had persistent nausea.  He has been unable to tolerate oral medications and wife has had difficulty giving him antiemetic suppositories alone.  I recommended rotating to Zofran ODT and giving it scheduled every 4 to 6 hours.  Also suggested rotating to Haldol elixir.  Could also try lorazepam as needed.  Given his decline and persistent symptoms, I would recommend consideration for transfer to the hospice home for end-of-life and comfort care.

## 2019-04-18 DIAGNOSIS — R180 Malignant ascites: Secondary | ICD-10-CM | POA: Diagnosis not present

## 2019-04-18 DIAGNOSIS — C7931 Secondary malignant neoplasm of brain: Secondary | ICD-10-CM | POA: Diagnosis not present

## 2019-04-18 DIAGNOSIS — C7951 Secondary malignant neoplasm of bone: Secondary | ICD-10-CM | POA: Diagnosis not present

## 2019-04-18 DIAGNOSIS — G40909 Epilepsy, unspecified, not intractable, without status epilepticus: Secondary | ICD-10-CM | POA: Diagnosis not present

## 2019-04-18 DIAGNOSIS — C61 Malignant neoplasm of prostate: Secondary | ICD-10-CM | POA: Diagnosis not present

## 2019-04-18 DIAGNOSIS — I712 Thoracic aortic aneurysm, without rupture: Secondary | ICD-10-CM | POA: Diagnosis not present

## 2019-04-19 DIAGNOSIS — I712 Thoracic aortic aneurysm, without rupture: Secondary | ICD-10-CM | POA: Diagnosis not present

## 2019-04-19 DIAGNOSIS — R180 Malignant ascites: Secondary | ICD-10-CM | POA: Diagnosis not present

## 2019-04-19 DIAGNOSIS — G40909 Epilepsy, unspecified, not intractable, without status epilepticus: Secondary | ICD-10-CM | POA: Diagnosis not present

## 2019-04-19 DIAGNOSIS — C7951 Secondary malignant neoplasm of bone: Secondary | ICD-10-CM | POA: Diagnosis not present

## 2019-04-19 DIAGNOSIS — C7931 Secondary malignant neoplasm of brain: Secondary | ICD-10-CM | POA: Diagnosis not present

## 2019-04-19 DIAGNOSIS — C61 Malignant neoplasm of prostate: Secondary | ICD-10-CM | POA: Diagnosis not present

## 2019-04-20 DIAGNOSIS — I712 Thoracic aortic aneurysm, without rupture: Secondary | ICD-10-CM | POA: Diagnosis not present

## 2019-04-20 DIAGNOSIS — C7931 Secondary malignant neoplasm of brain: Secondary | ICD-10-CM | POA: Diagnosis not present

## 2019-04-20 DIAGNOSIS — C7951 Secondary malignant neoplasm of bone: Secondary | ICD-10-CM | POA: Diagnosis not present

## 2019-04-20 DIAGNOSIS — R180 Malignant ascites: Secondary | ICD-10-CM | POA: Diagnosis not present

## 2019-04-20 DIAGNOSIS — G40909 Epilepsy, unspecified, not intractable, without status epilepticus: Secondary | ICD-10-CM | POA: Diagnosis not present

## 2019-04-20 DIAGNOSIS — C61 Malignant neoplasm of prostate: Secondary | ICD-10-CM | POA: Diagnosis not present

## 2019-04-21 ENCOUNTER — Telehealth: Payer: Self-pay | Admitting: Internal Medicine

## 2019-04-21 NOTE — Telephone Encounter (Signed)
I called patient's family/wife and offered my condolences.  Family very thankful for the call. Appreciate the cancer center staff for the care and support provided to the patient.

## 2019-05-18 DEATH — deceased

## 2019-08-28 IMAGING — CT NUCLEAR MEDICINE  NOPR SKULL BASE TO THIGH
1 of 9 series · 2 of 16 positions shown, 3 images · non-contrast
Comparison: MRI thoracic and lumbar spine 11/30/2018, CT
10/28/2018, CT 02/06/2018

CLINICAL DATA: Recurrent prostate cancer. Elevated PSA. Chronic
skeletal metastasis.

EXAM:
NUCLEAR MEDICINE PET SKULL BASE TO THIGH
TECHNIQUE: 9.8 mCi F-18 Fluciclovine was injected intravenously. Full-ring PET
imaging was performed from the skull base to thigh after the
radiotracer. CT data was obtained and used for attenuation
correction and anatomic localization.

[Series 3: ct wb 5.0 b30f · axial · 0.98mm/px · z∈[-1116,-132]mm · 2 of 329 slices shown, 3 images]
[im 1/329  soft-tissue]
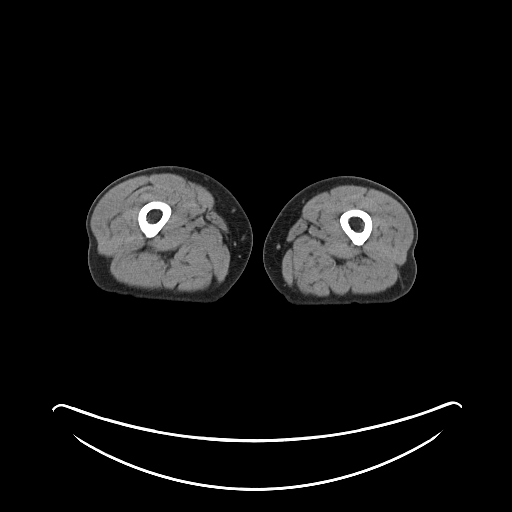
[im 1/329  bone]
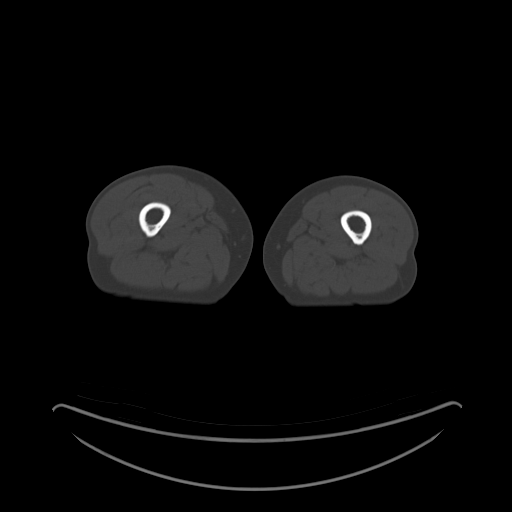
[im 329/329  soft-tissue]
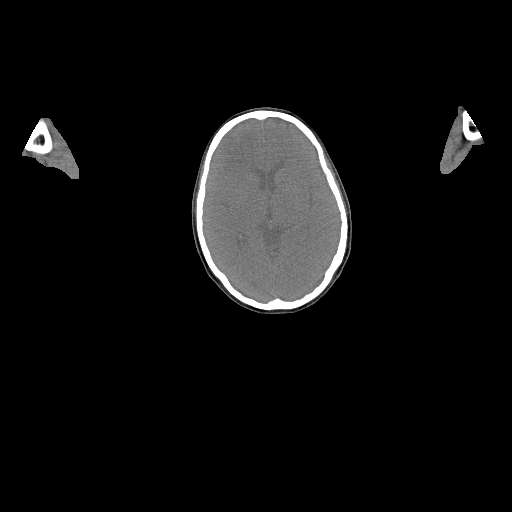

[2 of 16 positions shown; findings below may reference images not displayed]

FINDINGS: NECK

No radiotracer activity in neck lymph nodes.

Incidental CT finding: None

CHEST

No radiotracer accumulation within mediastinal or hilar lymph nodes.
No suspicious pulmonary nodules on the CT scan.

Incidental CT finding: Within the RIGHT lower lobe elongated nodule
measuring 9 mm (image 115/3) i s unchanged from prior and does not
have associated radiotracer activity.

ABDOMEN/PELVIS

Prostate: Moderate activity in the LEFT aspect of the prostate gland
with SUV max equal 4.7.

Lymph nodes: No abnormal radiotracer accumulation within pelvic or
abdominal nodes.

Liver: No evidence of liver metastasis

Incidental CT finding: None

SKELETON

There are multiple foci of sclerotic skeletal metastasis throughout
the spine ribs and pelvis. On the background of the multiple
sclerotic lesions are several foci of intense radiotracer
accumulation. One lesion in the medial aspect of the LEFT iliac bone
measures 20 mm (image 215/3) with SUV max equal 7.1. There is a
sclerotic lesion on comparison bone CT scan. This lesion was
suspicious on comparison MRI.

Adjacent to the LEFT iliac bone lesion is a LEFT sacral ala lesion
with similar in imaging characteristics with SUV max equal 4.7.

There is intense focus radiotracer accumulation within the pedicle
of the L2 vertebral body with SUV max equal 5.6.

Majority of the lumbar spine is photopenic related to radiation
treatment.

Higher within the thoracic and cervical spine diffuse uptake without
clear focality. One lesion is suspicious at the C4 level with SUV
max equal 6.7. There is sclerosis at this lesion additionally.
IMPRESSION: 1. Several foci of discrete intense radiotracer activity within the
pelvis and spine consistent with active prostate carcinoma. These
discrete lesions are on the background of more diffuse sclerotic
lesion throughout the spine and pelvis, the majority of which do not
have radiotracer activity. Currently active lesions correspond to
findings on recent spine MRI.
2. No evidence of nodal metastasis or soft tissue metastasis.
3. Asymmetric mild uptake in the LEFT lobe of the prostate gland is
nonspecific.
